# Patient Record
Sex: Female | Born: 1935 | Race: White | Hispanic: No | Smoking: Never smoker
Health system: Southern US, Community
[De-identification: ages and names within clinical notes are randomized; demographics above are authoritative.]

## PROBLEM LIST (undated history)

## (undated) DIAGNOSIS — D649 Anemia, unspecified: Secondary | ICD-10-CM

## (undated) DIAGNOSIS — L0390 Cellulitis, unspecified: Secondary | ICD-10-CM

## (undated) DIAGNOSIS — I10 Essential (primary) hypertension: Secondary | ICD-10-CM

## (undated) DIAGNOSIS — G4700 Insomnia, unspecified: Secondary | ICD-10-CM

## (undated) DIAGNOSIS — J309 Allergic rhinitis, unspecified: Secondary | ICD-10-CM

## (undated) DIAGNOSIS — R000 Tachycardia, unspecified: Secondary | ICD-10-CM

## (undated) DIAGNOSIS — I38 Endocarditis, valve unspecified: Secondary | ICD-10-CM

## (undated) DIAGNOSIS — F419 Anxiety disorder, unspecified: Secondary | ICD-10-CM

## (undated) DIAGNOSIS — K8020 Calculus of gallbladder without cholecystitis without obstruction: Secondary | ICD-10-CM

## (undated) DIAGNOSIS — M1990 Unspecified osteoarthritis, unspecified site: Secondary | ICD-10-CM

## (undated) DIAGNOSIS — Z9289 Personal history of other medical treatment: Secondary | ICD-10-CM

## (undated) HISTORY — DX: Unspecified osteoarthritis, unspecified site: M19.90

## (undated) HISTORY — DX: Calculus of gallbladder without cholecystitis without obstruction: K80.20

## (undated) HISTORY — DX: Cellulitis, unspecified: L03.90

## (undated) HISTORY — PX: THUMB ARTHROSCOPY: SHX2509

## (undated) HISTORY — PX: BUNIONECTOMY: SHX129

## (undated) HISTORY — DX: Essential (primary) hypertension: I10

## (undated) HISTORY — PX: BACK SURGERY: SHX140

## (undated) HISTORY — DX: Anxiety disorder, unspecified: F41.9

## (undated) HISTORY — PX: REPLACEMENT TOTAL KNEE: SUR1224

## (undated) HISTORY — DX: Personal history of other medical treatment: Z92.89

## (undated) HISTORY — DX: Tachycardia, unspecified: R00.0

## (undated) HISTORY — DX: Endocarditis, valve unspecified: I38

## (undated) HISTORY — DX: Allergic rhinitis, unspecified: J30.9

## (undated) HISTORY — PX: GALLBLADDER SURGERY: SHX652

## (undated) HISTORY — DX: Anemia, unspecified: D64.9

## (undated) HISTORY — DX: Insomnia, unspecified: G47.00

## (undated) HISTORY — PX: HIP SURGERY: SHX245

---

## 2004-08-27 ENCOUNTER — Ambulatory Visit: Payer: Self-pay | Admitting: Family Medicine

## 2005-05-22 ENCOUNTER — Ambulatory Visit: Payer: Self-pay | Admitting: Gastroenterology

## 2005-06-25 ENCOUNTER — Ambulatory Visit: Payer: Self-pay | Admitting: Gastroenterology

## 2005-11-10 ENCOUNTER — Ambulatory Visit: Payer: Self-pay | Admitting: Family Medicine

## 2007-03-17 ENCOUNTER — Ambulatory Visit: Payer: Self-pay | Admitting: Family Medicine

## 2007-03-25 ENCOUNTER — Ambulatory Visit: Payer: Self-pay | Admitting: Surgery

## 2007-03-29 ENCOUNTER — Ambulatory Visit: Payer: Self-pay | Admitting: Surgery

## 2007-03-29 HISTORY — PX: CHOLECYSTECTOMY: SHX55

## 2007-08-17 ENCOUNTER — Ambulatory Visit: Payer: Self-pay | Admitting: Family Medicine

## 2008-10-10 ENCOUNTER — Ambulatory Visit: Payer: Self-pay | Admitting: General Practice

## 2008-11-16 ENCOUNTER — Ambulatory Visit: Payer: Self-pay | Admitting: Family Medicine

## 2009-01-19 ENCOUNTER — Inpatient Hospital Stay (HOSPITAL_COMMUNITY): Admission: RE | Admit: 2009-01-19 | Discharge: 2009-01-23 | Payer: Self-pay | Admitting: Neurosurgery

## 2009-04-06 ENCOUNTER — Ambulatory Visit: Payer: Self-pay | Admitting: Neurosurgery

## 2009-04-18 ENCOUNTER — Ambulatory Visit: Payer: Self-pay

## 2009-05-08 ENCOUNTER — Encounter: Admission: RE | Admit: 2009-05-08 | Discharge: 2009-05-08 | Payer: Self-pay | Admitting: Neurosurgery

## 2009-06-14 ENCOUNTER — Encounter: Admission: RE | Admit: 2009-06-14 | Discharge: 2009-06-14 | Payer: Self-pay | Admitting: Neurosurgery

## 2009-09-27 ENCOUNTER — Ambulatory Visit: Payer: Self-pay | Admitting: Orthopedic Surgery

## 2010-01-21 ENCOUNTER — Ambulatory Visit: Payer: Self-pay | Admitting: Family Medicine

## 2010-05-22 ENCOUNTER — Encounter: Admission: RE | Admit: 2010-05-22 | Discharge: 2010-05-22 | Payer: Self-pay | Admitting: Neurosurgery

## 2010-08-02 ENCOUNTER — Encounter: Admission: RE | Admit: 2010-08-02 | Payer: Self-pay | Source: Home / Self Care | Admitting: Neurosurgery

## 2010-09-09 ENCOUNTER — Encounter
Admission: RE | Admit: 2010-09-09 | Discharge: 2010-09-09 | Payer: Self-pay | Source: Home / Self Care | Attending: Neurosurgery | Admitting: Neurosurgery

## 2010-10-30 ENCOUNTER — Other Ambulatory Visit: Payer: Self-pay | Admitting: Orthopedic Surgery

## 2010-10-30 DIAGNOSIS — M25551 Pain in right hip: Secondary | ICD-10-CM

## 2010-11-07 ENCOUNTER — Other Ambulatory Visit: Payer: Self-pay

## 2010-11-25 LAB — CBC
HCT: 39.8 % (ref 36.0–46.0)
MCHC: 33.5 g/dL (ref 30.0–36.0)
MCV: 90 fL (ref 78.0–100.0)
Platelets: 260 10*3/uL (ref 150–400)
WBC: 8.6 10*3/uL (ref 4.0–10.5)

## 2010-11-25 LAB — COMPREHENSIVE METABOLIC PANEL
AST: 21 U/L (ref 0–37)
Albumin: 4.3 g/dL (ref 3.5–5.2)
BUN: 12 mg/dL (ref 6–23)
CO2: 28 mEq/L (ref 19–32)
Calcium: 10.3 mg/dL (ref 8.4–10.5)
Chloride: 106 mEq/L (ref 96–112)
Creatinine, Ser: 0.59 mg/dL (ref 0.4–1.2)
GFR calc Af Amer: >60 mL/min (ref >60)
GFR calc non Af Amer: >60 mL/min (ref >60)
Total Bilirubin: 0.6 mg/dL (ref 0.3–1.2)

## 2010-11-25 LAB — TYPE AND SCREEN: ABO/RH(D): O POS

## 2010-11-25 LAB — URINALYSIS, ROUTINE W REFLEX MICROSCOPIC
Ketones, ur: NEGATIVE mg/dL
Nitrite: NEGATIVE
Protein, ur: NEGATIVE mg/dL
Urobilinogen, UA: 0.2 mg/dL (ref 0.0–1.0)

## 2010-11-25 LAB — PROTIME-INR: Prothrombin Time: 12.7 seconds (ref 11.6–15.2)

## 2010-11-25 LAB — DIFFERENTIAL
Basophils Absolute: 0 10*3/uL (ref 0.0–0.1)
Lymphocytes Relative: 20 % (ref 12–46)
Lymphs Abs: 1.7 10*3/uL (ref 0.7–4.0)
Neutro Abs: 6.2 10*3/uL (ref 1.7–7.7)

## 2010-11-25 LAB — APTT: aPTT: 32 seconds (ref 24–37)

## 2010-12-13 ENCOUNTER — Encounter: Payer: Self-pay | Admitting: Rheumatology

## 2010-12-17 ENCOUNTER — Encounter: Payer: Self-pay | Admitting: Rheumatology

## 2010-12-31 NOTE — H&P (Signed)
NAMEELERI, RUBEN NO.:  [id number]   MEDICAL RECORD NO.:  [id number]          PATIENT TYPE:  INP   LOCATION:  3016                         FACILITY:  [facility]   PHYSICIAN:  Payton Doughty, M.D.      DATE OF BIRTH:  September 04, 1932   DATE OF ADMISSION:  01/19/2009  DATE OF DISCHARGE:                              HISTORY & PHYSICAL   ADMISSION DIAGNOSIS:  Spondylosis at L4-L5.   BODY OF TEXT:  This is a very nice 77 year old right-handed white lady  since June 26, 2009 has had increasing dysesthesias in lower  extremities worse when she is up, cannot walk very far without pain in  her legs, has not fallen, does not describe bladder difficulty.  MR  shows spondylosis with a grade 1 slip at L4-L5 spinal stenosis and she  was sent to me.   MEDICAL HISTORY:  Benign.   MEDICATIONS:  She uses lisinopril, Mobic, trazodone, aspirin,  multivitamins and Tylenol.   ALLERGIES:  She is allergic to SULFA and PENICILLIN.   SURGICAL HISTORY:  Cholecystectomy, hand operation and bunion operation.   SOCIAL HISTORY:  She does not smoke or drink, is a retired Runner, broadcasting/film/video and  taught eighth grade Maths for 31 years.   FAMILY HISTORY:  Both parents are deceased, mother with stroke, father  with an MI and a stroke.   REVIEW OF SYSTEMS:  Remarkable for leg weakness, leg pain and arthritis.  HEENT:  Normal limits.  NECK:  She has reasonable range of motion.  CHEST: Clear.  CARDIAC:  Regular rate and rhythm.  ABDOMEN:  Nontender,  no hepatosplenomegaly.  EXTREMITIES:  Without clubbing, cyanosis.  GU:  Deferred.  Peripheral pulses are good.  NEUROLOGIC:  She is awake, alert  and oriented.  Cranial nerves are intact.  Motor exam shows 5/5 strength  throughout the upper and lower extremities.  Sensory dysesthesias  described roughly in L4 distribution.  Reflexes are 2 at the knees, 1 at  the ankles and toes downgoing bilaterally.   She comes with an MR that demonstrates a grade 1 slip of L4  and L5.  She  has significant spinal stenosis and facet arthropathy.   CLINICAL IMPRESSION:  Neurogenic claudication secondary to spondylosis  with a grade 1 spondylolisthesis at L4-L5.   PLAN:  Laminectomy, diskectomy, posterior lumbar interbody fusion and  pedicle screw fixation.  The risks and benefits have been discussed with  her and she wished to proceed.   .           ______________________________  Payton Doughty, M.D.     MWR/MEDQ  D:  01/19/2009  T:  01/20/2009  Job:  [id number]

## 2010-12-31 NOTE — Op Note (Signed)
NAMELASHINA, Alicia Mccann NO.:  [id number]   MEDICAL RECORD NO.:  [id number]          PATIENT TYPE:  INP   LOCATION:  3016                         FACILITY:  [facility]   PHYSICIAN:  Payton Doughty, M.D.      DATE OF BIRTH:  07/08/1933   DATE OF PROCEDURE:  01/19/2009  DATE OF DISCHARGE:                               OPERATIVE REPORT   PREOPERATIVE DIAGNOSIS:  Grade 2 spondylolisthesis of L4 and L5.   POSTOPERATIVE DIAGNOSIS:  Grade 2 spondylolisthesis of L4 and L5.   OPERATIVE PROCEDURE:  L4-5 laminectomy, diskectomy, posterior lumbar  interbody fusion using Ray threaded fusion cage, nonsegmental pedicle  screw fixation at L4-5, and posterolateral arthrodesis at L4-5.   SURGEON:  Payton Doughty, MD, Neurosurgery.   ANESTHESIA:  General endotracheal.   PREPARATION:  Prepped and draped with alcohol wipe.   COMPLICATIONS:  None.   NURSE ASSISTANT:  K[place]ik.   DOCTOR ASSISTANT:  Hilda Lias, MD   DESCRIPTION:  This is a 77 year old lady with neurogenic claudication  and spondylolisthesis of L4 and L5 that is degenerative in nature.  She  was taken to the operating room, smoothly anesthetized, intubated, and  placed prone on the operating table.  Following shave, prep, and drape  in usual sterile fashion, skin was infiltrated with 1% lidocaine with  1:400,000 epinephrine.  Skin was incised from middle of L3 to the middle  of L5 and the lamina and transverse process of L4 and top of L5, and  transverse process were dissected free in subperiosteal plane.  Intraoperative x-ray confirmed correctness of the level of the pars  interarticularis, inferior facet, the lamina of L4 and the superior end  of the superior facet of L5 were removed using the Kerrison and high-  speed drill.  The bone was set aside for grafting.  This allowed  decompression of the lateral recess.  The L5 and L4 roots were both  dissected free as they rounded their respective pedicles.  There was a  significant slip and disk bulging associated with it.  Diskectomy was  carried out at L4-5, and then Ray threaded fusion cages were placed 12 x  21 mm.  Reduction of the slip was grade 1.  Pedicle screws were then  placed in L4 and L5 under x-ray guidance and connected to the rods and  locking caps attached and tightened.  The Ray cages were packed with  bone graft harvested from the facet joints and capped.  Transverse  processes of L4 and L5 decorticated with a high-speed drill and packed  with BMP on the Actifuse  extender matrix.  Final x-ray showed good placement of cages, screws,  and rods.  Successive layers of 0 Vicryl, 2-0 Vicryl, and 3-0 nylon were  used to close.  Betadine and Telfa dressing was applied, made occlusive  with OpSite.  The patient returned to the recovery room in good  condition.           ______________________________  Payton Doughty, M.D.     MWR/MEDQ  D:  01/19/2009  T:  01/20/2009  Job:  [id number]

## 2010-12-31 NOTE — Discharge Summary (Signed)
NAMEAIYA, KEACH NO.:  [id number]   MEDICAL RECORD NO.:  [id number]          PATIENT TYPE:  INP   LOCATION:  3016                         FACILITY:  [facility]   PHYSICIAN:  Payton Doughty, M.D.      DATE OF BIRTH:  [birth year]   DATE OF ADMISSION:  01/19/2009  DATE OF DISCHARGE:  01/23/2009                               DISCHARGE SUMMARY   ADMITTING DIAGNOSIS:  Spondylosis at L4-5.   DISCHARGE DIAGNOSIS:  Spondylosis at L4-5.   OPERATIVE PROCEDURE:  L4-5 fusion.   COMPLICATION:  None.   DISCHARGE STATUS:  Alive and well.   BODY OF TEXT:  This is a 77 year old right-handed white girl whose  history and physical is recounted in the chart.  She has been followed  since November.  She has a grade 1 slip of 4/5, worsening pain in her  lower extremities.  Medical history is benign.  General exam was intact.  Neurologic exam was intact.  She was admitted after ascertaining normal  laboratory values and underwent a 4/5 fusion with Ray cages and pedicle  screws.  Postoperatively, she has done well.  She had a Foley out the  second postop day.  PCA stopped on the third postop day.  She is up and  about eating and voiding normally, participating in physical therapy,  can get her brace off and on, get in and out of bed.  She is being  discharged home in care of her family.  Her followup will be in [facility]  offices next week for suture removal.           ______________________________  Payton Doughty, M.D.     MWR/MEDQ  D:  01/23/2009  T:  01/23/2009  Job:  [id number]

## 2011-02-02 ENCOUNTER — Emergency Department: Payer: Self-pay | Admitting: Emergency Medicine

## 2011-02-04 ENCOUNTER — Inpatient Hospital Stay: Payer: Self-pay | Admitting: Specialist

## 2011-02-13 ENCOUNTER — Other Ambulatory Visit: Payer: Self-pay | Admitting: Podiatry

## 2011-02-17 ENCOUNTER — Encounter: Payer: Self-pay | Admitting: Nurse Practitioner

## 2011-10-13 ENCOUNTER — Ambulatory Visit: Payer: Self-pay | Admitting: Family Medicine

## 2011-10-23 ENCOUNTER — Encounter: Payer: Self-pay | Admitting: Family Medicine

## 2011-11-17 ENCOUNTER — Encounter: Payer: Self-pay | Admitting: Family Medicine

## 2011-12-17 ENCOUNTER — Encounter: Payer: Self-pay | Admitting: Family Medicine

## 2012-04-06 ENCOUNTER — Encounter: Payer: Self-pay | Admitting: Family Medicine

## 2012-04-18 ENCOUNTER — Encounter: Payer: Self-pay | Admitting: Family Medicine

## 2012-07-08 DIAGNOSIS — Z9889 Other specified postprocedural states: Secondary | ICD-10-CM | POA: Insufficient documentation

## 2012-07-08 DIAGNOSIS — M1610 Unilateral primary osteoarthritis, unspecified hip: Secondary | ICD-10-CM | POA: Insufficient documentation

## 2012-10-08 ENCOUNTER — Encounter: Payer: Self-pay | Admitting: Family Medicine

## 2012-10-16 ENCOUNTER — Encounter: Payer: Self-pay | Admitting: Family Medicine

## 2012-11-16 ENCOUNTER — Encounter: Payer: Self-pay | Admitting: Family Medicine

## 2013-06-27 ENCOUNTER — Encounter (INDEPENDENT_AMBULATORY_CARE_PROVIDER_SITE_OTHER): Payer: Self-pay

## 2013-06-27 ENCOUNTER — Encounter: Payer: Self-pay | Admitting: Cardiovascular Disease

## 2013-06-27 ENCOUNTER — Ambulatory Visit (INDEPENDENT_AMBULATORY_CARE_PROVIDER_SITE_OTHER): Payer: Medicare Other | Admitting: Cardiovascular Disease

## 2013-06-27 VITALS — BP 140/85 | HR 95 | Ht 62.0 in | Wt 175.5 lb

## 2013-06-27 DIAGNOSIS — I10 Essential (primary) hypertension: Secondary | ICD-10-CM | POA: Insufficient documentation

## 2013-06-27 DIAGNOSIS — R011 Cardiac murmur, unspecified: Secondary | ICD-10-CM | POA: Insufficient documentation

## 2013-06-27 DIAGNOSIS — E669 Obesity, unspecified: Secondary | ICD-10-CM

## 2013-06-27 DIAGNOSIS — M1990 Unspecified osteoarthritis, unspecified site: Secondary | ICD-10-CM | POA: Insufficient documentation

## 2013-06-27 DIAGNOSIS — E782 Mixed hyperlipidemia: Secondary | ICD-10-CM | POA: Insufficient documentation

## 2013-06-27 DIAGNOSIS — E785 Hyperlipidemia, unspecified: Secondary | ICD-10-CM

## 2013-06-27 NOTE — Progress Notes (Signed)
Patient ID: Alicia Mccann, female    DOB: September 25, 1932, 79 y.o.   MRN: [id number]  HPI Comments: Alicia Mccann is a very pleasant 79 year old woman, patient of Dr. Elease Hashimoto, history of hypertension, obesity, osteoarthritis who presents for evaluation of murmur, hypertension.  She reports that in general she has been doing well. She had a recent 6 minute walk test with Silver sneakers and initial blood pressure 142/76 increased to 2 204/96. She reports her blood pressure was taken over her coat/jacket. After 5 minutes, blood pressure improved down to 164/90. Since then her amlodipine was increased up to 5 mg daily. Prior to this change, there were several more elevated blood pressures 148 systolic and 160 systolic. Recently blood pressure 138. In our office today systolic pressures 140.  She denies any chest pain or shortness of breath with exertion. She does have a reasonable exercise tolerance, is very active, walks her dog on a daily basis with no symptoms. She is concerned about her heart murmur  Echocardiogram May 2010 showed normal LV systolic function, mild MR and TR  Echocardiogram stress test May 2010 was essentially normal. She exercised for 3 minutes achieved 4.6 METs, peak heart rate 151 beats per minute. No EKG changes concerning for ischemia. Echocardiogram showed no ischemia  Followup note from cardiology April 2011 suggested she have fatigue from low-dose beta blocker. She was on lisinopril 20 mg at that time.  She does report having a family history. Mother was a smoker, had a stroke in her early 73s. Father had MI in his late 68s was not a smoker  EKG shows normal sinus rhythm with rate 95 beats per minute, nonspecific ST abnormality     Outpatient Encounter Prescriptions as of 06/27/2013  Medication Sig  . amLODipine (NORVASC) 5 MG tablet Take 5 mg by mouth daily.   Marland Kitchen aspirin 81 MG tablet Take 81 mg by mouth daily.  . cholecalciferol (VITAMIN D) 1000 UNITS tablet Take 2,000  Units by mouth daily.  . Cyanocobalamin (VITAMIN B 12 PO) Take 1,000 mg by mouth daily.  . hydrochlorothiazide (HYDRODIURIL) 25 MG tablet Take 25 mg by mouth daily.   . meloxicam (MOBIC) 7.5 MG tablet Take 7.5 mg by mouth daily as needed for pain.  . traMADol (ULTRAM) 50 MG tablet Take 50 mg by mouth every 6 (six) hours as needed.     Review of Systems  Constitutional: Negative.   HENT: Negative.   Eyes: Negative.   Respiratory: Negative.   Cardiovascular: Negative.   Gastrointestinal: Negative.   Endocrine: Negative.   Musculoskeletal: Negative.   Skin: Negative.   Allergic/Immunologic: Negative.   Neurological: Negative.   Hematological: Negative.   Psychiatric/Behavioral: Negative.   All other systems reviewed and are negative.    BP 140/85  Pulse 95  Ht 5\' 2"  (1.575 m)  Wt 175 lb 8 oz (79.606 kg)  BMI 32.09 kg/m2  Physical Exam  Nursing note and vitals reviewed. Constitutional: She is oriented to person, place, and time. She appears well-developed and well-nourished.  HENT:  Head: Normocephalic.  Nose: Nose normal.  Mouth/Throat: Oropharynx is clear and moist.  Eyes: Conjunctivae are normal. Pupils are equal, round, and reactive to light.  Neck: Normal range of motion. Neck supple. No JVD present.  Cardiovascular: Normal rate, regular rhythm, S1 normal, S2 normal and intact distal pulses.  Exam reveals no gallop and no friction rub.   Murmur heard.  Systolic murmur is present with a grade of 2/6  Pulmonary/Chest: Effort  normal and breath sounds normal. No respiratory distress. She has no wheezes. She has no rales. She exhibits no tenderness.  Abdominal: Soft. Bowel sounds are normal. She exhibits no distension. There is no tenderness.  Musculoskeletal: Normal range of motion. She exhibits no edema and no tenderness.  Lymphadenopathy:    She has no cervical adenopathy.  Neurological: She is alert and oriented to person, place, and time. Coordination normal.  Skin:  Skin is warm and dry. No rash noted. No erythema.  Psychiatric: She has a normal mood and affect. Her behavior is normal. Judgment and thought content normal.    Assessment and Plan

## 2013-06-27 NOTE — Assessment & Plan Note (Signed)
Clinical exam suggestive of aortic valve sclerosis without stenosis. Given prior echocardiogram several years ago showing no significant aortic valve stenosis (velocity measurements were normal), would hold off on further testing. Murmur is a grade 1-2. Would suggest repeat exam once a year. Typically aortic valve stenosis will take many years for significant progression, likely more than 10 years. Minimal mitral and tricuspid valve regurgitation on prior echocardiogram. Typically this will not progress. We have discussed this with the patient.

## 2013-06-27 NOTE — Assessment & Plan Note (Signed)
We'll try to obtain the most recent lipid panel for our records. Would recommend aggressive treatment for cholesterol given father who had MI in his late 78s, mother with stroke at 67

## 2013-06-27 NOTE — Patient Instructions (Addendum)
You are doing well. No medication changes were made.  Please monitor your blood pressure Goal is <140/<90 If it runs high, take an extra amlodipine (5 twice a day or 10 once a day)  Consider Red Yeast Rice for cholesterol  Please call us if you have new issues that need to be addressed before your next appt.  Your physician wants you to follow-up in: 12 months.  You will receive a reminder letter in the mail two months in advance. If you don't receive a letter, please call our office to schedule the follow-up appointment.

## 2013-06-27 NOTE — Assessment & Plan Note (Signed)
Blood pressure did improve on recheck by myself with systolic pressure 140. I suspect she may need amlodipine 10 mg at baseline. Blood pressure may improve with improved conditioning. Encouraged her to continue her walking program. Also encouraged her to buy a blood pressure cuff, check her blood pressures at home and bring these in when she sees Dr. Elease Hashimoto in followup. We'll avoid beta blockers given fatigue in the past. Seems to be doing well on amlodipine and HCTZ.

## 2013-06-27 NOTE — Assessment & Plan Note (Signed)
We have encouraged continued exercise, careful diet management in an effort to lose weight. 

## 2013-06-27 NOTE — Assessment & Plan Note (Signed)
Suggested she continue on her meloxicam. If she starts to take as frequently, would let Dr. Elease Hashimoto now. May benefit from a PPI.

## 2014-02-08 LAB — BASIC METABOLIC PANEL
BUN: 24 mg/dL — AB (ref 4–21)
CREATININE: 1 mg/dL (ref 0.5–1.1)
GLUCOSE: 104 mg/dL
POTASSIUM: 3.5 mmol/L (ref 3.4–5.3)
Sodium: 137 mmol/L (ref 137–147)

## 2014-02-08 LAB — TSH: TSH: 1.74 u[IU]/mL (ref 0.41–5.90)

## 2014-02-08 LAB — CBC AND DIFFERENTIAL
HCT: 39 % (ref 36–46)
Hemoglobin: 13.3 g/dL (ref 12.0–16.0)
PLATELETS: 359 10*3/uL (ref 150–399)

## 2014-02-08 LAB — HEPATIC FUNCTION PANEL
ALT: 28 U/L (ref 7–35)
AST: 17 U/L (ref 13–35)

## 2014-06-19 ENCOUNTER — Encounter: Payer: Self-pay | Admitting: Family Medicine

## 2014-06-27 ENCOUNTER — Ambulatory Visit (INDEPENDENT_AMBULATORY_CARE_PROVIDER_SITE_OTHER): Payer: Medicare Other | Admitting: Cardiovascular Disease

## 2014-06-27 ENCOUNTER — Encounter: Payer: Self-pay | Admitting: Cardiovascular Disease

## 2014-06-27 VITALS — BP 160/80 | HR 75 | Ht 61.0 in | Wt 168.2 lb

## 2014-06-27 DIAGNOSIS — E669 Obesity, unspecified: Secondary | ICD-10-CM

## 2014-06-27 DIAGNOSIS — E785 Hyperlipidemia, unspecified: Secondary | ICD-10-CM

## 2014-06-27 DIAGNOSIS — R011 Cardiac murmur, unspecified: Secondary | ICD-10-CM

## 2014-06-27 DIAGNOSIS — I10 Essential (primary) hypertension: Secondary | ICD-10-CM

## 2014-06-27 DIAGNOSIS — Z0000 Encounter for general adult medical examination without abnormal findings: Secondary | ICD-10-CM | POA: Insufficient documentation

## 2014-06-27 NOTE — Progress Notes (Signed)
Patient ID: Lula OlszewskiJacqueline L Kronick, female    DOB: 01/08/1933, 80 y.o.   MRN: [id number]  HPI Comments: [name] is a very pleasant 80 year old woman, patient of Dr. Elease HashimotoMaloney, history of hypertension, obesity, osteoarthritis, previously evaluated for murmur , hypertension. She presents for routine follow-up  In follow-up today, she reports that she lost her son earlier in the year likely secondary to acute MI.  She has had difficulty adjusting in light of this sad news. She is exercising on a regular basis, daily. She denies having any shortness of breath or chest discomfort. She also walks her dog and has no symptoms.  She reports that in general she has been doing well. She had a recent 6 minute walk test with Silver sneakers and initial blood pressure 142/76 increased to 2 204/96. She reports her blood pressure was taken over her coat/jacket. After 5 minutes, blood pressure improved down to 164/90. Since then her amlodipine was increased up to 5 mg daily. Prior to this change, there were several more elevated blood pressures 148 systolic and 160 systolic. Recently blood pressure 138. In our office today systolic pressures 140.  She denies any chest pain or shortness of breath with exertion. She does have a reasonable exercise tolerance, is very active, walks her dog on a daily basis with no symptoms.  EKG shows normal sinus rhythm with rate 75 beats per minute, no significant ST or T-wave abnormality  Other past medical history Echocardiogram May 2010 showed normal LV systolic function, mild MR and TR  Echocardiogram stress test May 2010 was essentially normal. She exercised for 3 minutes achieved 4.6 METs, peak heart rate 151 beats per minute. No EKG changes concerning for ischemia. Echocardiogram showed no ischemia  Followup note from cardiology April 2011 suggested she have fatigue from low-dose beta blocker. She was on lisinopril 20 mg at that time.  She does report having a family history.  Mother was a smoker, had a stroke in her early 7660s. Father had MI in his late 6250s was not a smoker     Outpatient Encounter Prescriptions as of 06/27/2014  Medication Sig  . Acetaminophen (EXTRA STRENGTH ACETAMINOPHEN) 500 MG coapsule Take 500 mg by mouth as needed for fever.  Marland Kitchen. amLODipine (NORVASC) 5 MG tablet Take 2.5 mg by mouth daily.   . cholecalciferol (VITAMIN D) 1000 UNITS tablet Take 2,000 Units by mouth daily.  . Cyanocobalamin (VITAMIN B 12 PO) Take 1,000 mg by mouth daily.  . meloxicam (MOBIC) 7.5 MG tablet Take 7.5 mg by mouth daily as needed for pain.  . traMADol (ULTRAM) 50 MG tablet Take 50 mg by mouth every 6 (six) hours as needed.  . triamterene-hydrochlorothiazide (DYAZIDE) 50-25 MG per capsule Take 1 capsule by mouth every morning.   Social history  reports that she has never smoked. She does not have any smokeless tobacco history on file. She reports that she does not drink alcohol or use illicit drugs.  Review of Systems  Constitutional: Negative.   Eyes: Negative.   Respiratory: Negative.   Cardiovascular: Negative.   Musculoskeletal: Negative.   Neurological: Negative.   Psychiatric/Behavioral: Negative.   All other systems reviewed and are negative.   BP 160/80 mmHg  Ht 5\' 1"  (1.549 m)  Wt 168 lb 4 oz (76.318 kg)  BMI 31.81 kg/m2  Physical Exam  Constitutional: She is oriented to person, place, and time. She appears well-developed and well-nourished.  obese  HENT:  Head: Normocephalic.  Nose: Nose normal.  Mouth/Throat: Oropharynx is clear and moist.  Eyes: Conjunctivae are normal. Pupils are equal, round, and reactive to light.  Neck: Normal range of motion. Neck supple. No JVD present.  Cardiovascular: Normal rate, regular rhythm, S1 normal, S2 normal and intact distal pulses.  Exam reveals no gallop and no friction rub.   Murmur heard.  Systolic murmur is present with a grade of 1/6  Pulmonary/Chest: Effort normal and breath sounds normal. No  respiratory distress. She has no wheezes. She has no rales. She exhibits no tenderness.  Abdominal: Soft. Bowel sounds are normal. She exhibits no distension. There is no tenderness.  Musculoskeletal: Normal range of motion. She exhibits no edema or tenderness.  Lymphadenopathy:    She has no cervical adenopathy.  Neurological: She is alert and oriented to person, place, and time. Coordination normal.  Skin: Skin is warm and dry. No rash noted. No erythema.  Psychiatric: She has a normal mood and affect. Her behavior is normal. Judgment and thought content normal.    Assessment and Plan  Nursing note and vitals reviewed.

## 2014-06-27 NOTE — Assessment & Plan Note (Signed)
We have encouraged continued exercise, careful diet management in an effort to lose weight. 

## 2014-06-27 NOTE — Assessment & Plan Note (Signed)
No prior lipid panel available for the past several years. We did check with [facility]. She will come in at her convenience for a lipid panel. Significant family history of cardiac disease

## 2014-06-27 NOTE — Assessment & Plan Note (Signed)
Minimal murmur appreciated on exam, likely aortic valve sclerosis without stenosis

## 2014-06-27 NOTE — Patient Instructions (Signed)
You are doing well. No medication changes were made.  Please call us if you have new issues that need to be addressed before your next appt.  Your physician wants you to follow-up in: 12 months.  You will receive a reminder letter in the mail two months in advance. If you don't receive a letter, please call our office to schedule the follow-up appointment.  Your next appointment will be scheduled in our new office located at :  [facility]  [address], [address]  [place], [place] [address]  

## 2014-06-27 NOTE — Assessment & Plan Note (Signed)
Blood pressure is well controlled on today's visit. No changes made to the medications. 

## 2014-07-18 ENCOUNTER — Encounter: Payer: Self-pay | Admitting: Family Medicine

## 2014-08-18 ENCOUNTER — Encounter: Payer: Self-pay | Admitting: Family Medicine

## 2014-10-13 ENCOUNTER — Encounter: Payer: Self-pay | Admitting: Family Medicine

## 2014-10-17 ENCOUNTER — Encounter
Admit: 2014-10-17 | Disposition: A | Discharge status: Home / Self Care | Payer: Self-pay | Attending: Family Medicine | Admitting: Family Medicine

## 2014-11-17 ENCOUNTER — Encounter
Admit: 2014-11-17 | Disposition: A | Discharge status: Home / Self Care | Payer: Self-pay | Attending: Family Medicine | Admitting: Family Medicine

## 2015-03-15 ENCOUNTER — Other Ambulatory Visit: Payer: Self-pay | Admitting: Family Medicine

## 2015-03-15 DIAGNOSIS — I10 Essential (primary) hypertension: Secondary | ICD-10-CM

## 2015-04-30 ENCOUNTER — Telehealth: Payer: Self-pay | Admitting: Family Medicine

## 2015-04-30 ENCOUNTER — Ambulatory Visit (INDEPENDENT_AMBULATORY_CARE_PROVIDER_SITE_OTHER): Payer: Medicare Other | Admitting: Family Medicine

## 2015-04-30 ENCOUNTER — Encounter: Payer: Self-pay | Admitting: Family Medicine

## 2015-04-30 VITALS — BP 140/78 | HR 80 | Temp 97.9°F | Resp 16 | Ht 61.5 in | Wt 169.0 lb

## 2015-04-30 DIAGNOSIS — K5730 Diverticulosis of large intestine without perforation or abscess without bleeding: Secondary | ICD-10-CM | POA: Diagnosis not present

## 2015-04-30 DIAGNOSIS — J309 Allergic rhinitis, unspecified: Secondary | ICD-10-CM | POA: Insufficient documentation

## 2015-04-30 DIAGNOSIS — M545 Low back pain, unspecified: Secondary | ICD-10-CM | POA: Insufficient documentation

## 2015-04-30 DIAGNOSIS — G8929 Other chronic pain: Secondary | ICD-10-CM | POA: Insufficient documentation

## 2015-04-30 DIAGNOSIS — G4700 Insomnia, unspecified: Secondary | ICD-10-CM | POA: Insufficient documentation

## 2015-04-30 DIAGNOSIS — M1990 Unspecified osteoarthritis, unspecified site: Secondary | ICD-10-CM | POA: Insufficient documentation

## 2015-04-30 DIAGNOSIS — F338 Other recurrent depressive disorders: Secondary | ICD-10-CM | POA: Insufficient documentation

## 2015-04-30 DIAGNOSIS — K5732 Diverticulitis of large intestine without perforation or abscess without bleeding: Secondary | ICD-10-CM

## 2015-04-30 DIAGNOSIS — I38 Endocarditis, valve unspecified: Secondary | ICD-10-CM | POA: Insufficient documentation

## 2015-04-30 DIAGNOSIS — D649 Anemia, unspecified: Secondary | ICD-10-CM | POA: Insufficient documentation

## 2015-04-30 DIAGNOSIS — G5600 Carpal tunnel syndrome, unspecified upper limb: Secondary | ICD-10-CM | POA: Insufficient documentation

## 2015-04-30 DIAGNOSIS — F419 Anxiety disorder, unspecified: Secondary | ICD-10-CM | POA: Insufficient documentation

## 2015-04-30 DIAGNOSIS — Z6830 Body mass index (BMI) 30.0-30.9, adult: Secondary | ICD-10-CM | POA: Insufficient documentation

## 2015-04-30 DIAGNOSIS — R600 Localized edema: Secondary | ICD-10-CM | POA: Insufficient documentation

## 2015-04-30 MED ORDER — METRONIDAZOLE 500 MG PO TABS: 500.0000 mg | ORAL_TABLET | Freq: Three times a day (TID) | ORAL | Status: DC

## 2015-04-30 MED ORDER — CIPROFLOXACIN HCL 250 MG PO TABS: 250.0000 mg | ORAL_TABLET | Freq: Two times a day (BID) | ORAL | Status: DC

## 2015-04-30 NOTE — Telephone Encounter (Signed)
Pt was just in and she was prescribed a medication and when she took it to the pharmacy to be filled they told her that it was to close to a prescription that she is allergic to.  They want to know if you can prescribe her something different.  She uses [facility] .  Her call back is [phone]  Thanks Barth Kirks

## 2015-04-30 NOTE — Progress Notes (Signed)
Subjective:    Patient ID: Alicia Mccann, female    DOB: 18-May-1933, 81 y.o.   MRN: [id number]  Abdominal Pain This is a recurrent problem. The problem has been waxing and waning. The pain is located in the generalized abdominal region. The pain is at a severity of 6/10. The pain is moderate. The quality of the pain is cramping and sharp. The abdominal pain does not radiate. Associated symptoms include arthralgias (arthritis), constipation, diarrhea, a fever ("Not quite 101") and frequency (pt's baseline). Pertinent negatives include no anorexia, belching, dysuria, flatus, headaches, hematochezia, hematuria, melena, myalgias, nausea, vomiting or weight loss. Nothing aggravates the pain. The pain is relieved by bowel movements. Her past medical history is significant for irritable bowel syndrome.   Had grilled chicken salad and has been bad since then. Has not had it as bad. Did take antibiotic for something similar in the past.  No energy.    Review of Systems  Constitutional: Positive for fever ("Not quite 101"), activity change and fatigue. Negative for chills, weight loss, diaphoresis, appetite change and unexpected weight change.  Gastrointestinal: Positive for abdominal pain, diarrhea and constipation. Negative for nausea, vomiting, melena, hematochezia, anorexia and flatus.  Genitourinary: Positive for frequency (pt's baseline). Negative for dysuria and hematuria.  Musculoskeletal: Positive for arthralgias (arthritis). Negative for myalgias.  Neurological: Negative for headaches.   BP 140/78 mmHg  Pulse 80  Temp(Src) 97.9 F (36.6 C) (Oral)  Resp 16  Ht 5' 1.5" (1.562 m)  Wt 169 lb (76.658 kg)  BMI 31.42 kg/m2   Patient Active Problem List   Diagnosis Date Noted  . Allergic rhinitis 04/30/2015  . Absolute anemia 04/30/2015  . Anxiety 04/30/2015  . Adult BMI 30+ 04/30/2015  . Carpal tunnel syndrome 04/30/2015  . Chronic LBP 04/30/2015  . Edema extremities 04/30/2015  .  Cannot sleep 04/30/2015  . Arthritis sicca 04/30/2015  . Seasonal affective disorder 04/30/2015  . Heart valve disease 04/30/2015  . Encounter for preventive health examination 06/27/2014  . Heart murmur 06/27/2013  . Essential hypertension 06/27/2013  . Obesity 06/27/2013  . Osteoarthritis 06/27/2013  . Hyperlipidemia 06/27/2013  . Coxitis 07/08/2012  . History of repair of hip joint 07/08/2012  . Cholelithiasis without obstruction 03/22/2007   Past Medical History  Diagnosis Date  . Osteoarthritis   . Cellulitis   . Anxiety   . Tachycardia   . Tachycardia   . Allergic rhinitis   . Insomnia   . Hypertension   . Anemia   . Valvular heart disease   . Cholelithiasis    Current Outpatient Prescriptions on File Prior to Visit  Medication Sig  . Acetaminophen (EXTRA STRENGTH ACETAMINOPHEN) 500 MG coapsule Take 500 mg by mouth as needed for fever.  Marland Kitchen amLODipine (NORVASC) 5 MG tablet Take 2.5 mg by mouth daily.   Marland Kitchen aspirin 81 MG tablet Take 81 mg by mouth daily.  . cholecalciferol (VITAMIN D) 1000 UNITS tablet Take 2,000 Units by mouth daily.  Marland Kitchen triamterene-hydrochlorothiazide (MAXZIDE) 75-50 MG per tablet TAKE ONE TABLET BY MOUTH EVERY DAY  . Cyanocobalamin (VITAMIN B 12 PO) Take 1,000 mg by mouth daily.  . meloxicam (MOBIC) 7.5 MG tablet Take 7.5 mg by mouth daily as needed for pain.  . traMADol (ULTRAM) 50 MG tablet Take 50 mg by mouth every 6 (six) hours as needed.   No current facility-administered medications on file prior to visit.   Allergies  Allergen Reactions  . Albumen, Egg   . Eggs Or  Egg-Derived Products   . Milk-Related Compounds   . Penicillins   . Shellfish Allergy   . Sulfa Antibiotics   . Wheat Bran    Past Surgical History  Procedure Laterality Date  . Hip surgery      bilateral   . Back surgery    . Gallbladder surgery    . Bunionectomy    . Cholecystectomy  03/29/2007  . Thumb arthroscopy     Social History   Social History  . Marital  Status: Widowed    Spouse Name: N/A  . Number of Children: N/A  . Years of Education: N/A   Occupational History  . Not on file.   Social History Main Topics  . Smoking status: Never Smoker   . Smokeless tobacco: Never Used  . Alcohol Use: No  . Drug Use: No  . Sexual Activity: Not on file   Other Topics Concern  . Not on file   Social History Narrative   Family History  Problem Relation Age of Onset  . Heart attack Father   . Stroke Father   . Heart disease Father   . CAD Father   . Heart attack Son 27    MI  . Hypertension Son   . Hypertension Mother   . Heart disease Mother   . Stroke Mother        Objective:   Physical Exam  Constitutional: She is oriented to person, place, and time. She appears well-developed and well-nourished.  Cardiovascular: Normal rate and regular rhythm.   Pulmonary/Chest: Effort normal and breath sounds normal.  Abdominal: Soft. Bowel sounds are normal. There is tenderness (mild in left lower quadrant. ).  Neurological: She is alert and oriented to person, place, and time.  Psychiatric: She has a normal mood and affect. Her behavior is normal.  BP 140/78 mmHg  Pulse 80  Temp(Src) 97.9 F (36.6 C) (Oral)  Resp 16  Ht 5' 1.5" (1.562 m)  Wt 169 lb (76.658 kg)  BMI 31.42 kg/m2       Assessment & Plan:  1. Diverticulosis of large intestine without hemorrhage Reviewed previous colonoscopy.   2. Diverticulitis of large intestine without perforation or abscess without bleeding New problem.  Is improved some today, but still tender. In light of age and fever and known diverticulosis. Will treat. Patient instructed to call back if condition worsens or does not improve.    - metroNIDAZOLE (FLAGYL) 500 MG tablet; Take 1 tablet (500 mg total) by mouth 3 (three) times daily.  Dispense: 21 tablet; Refill: 0 - ciprofloxacin (CIPRO) 250 MG tablet; Take 1 tablet (250 mg total) by mouth 2 (two) times daily.  Dispense: 14 tablet; Refill: 0    Patient was seen and examined by Leo Grosser, MD, and note scribed, in part, by Allene Dillon, CMA and reviewed with patient. I have reviewed the document for accuracy and completeness and I agree with above. Leo Grosser, MD .   Lorie Phenix, MD

## 2015-04-30 NOTE — Telephone Encounter (Signed)
Tried calling patient. Need to know which abx they are referring to. May need to call pharmacy. Will try again later.

## 2015-04-30 NOTE — Telephone Encounter (Signed)
Pt was just in, you gave her a medication that when she took it to her pharmacy they told her it was too close to a prescription that she is allergic to and she wants to know if you can prescribe her something else.   She goes to [facility] .  Her call back is [phone]  Thanks Barth Kirks

## 2015-04-30 NOTE — Telephone Encounter (Signed)
Please call pharmacy and clarify which medication. Also contact patient and see how she is doing. Thanks.

## 2015-05-01 NOTE — Telephone Encounter (Signed)
Pt says she is allergic to Levofloxacin which is a relative of Cipro.  (I just added it to her allergy list).  Please send another antibiotic to [facility].   She was able to get the Flagyl and has already taking two doses.  She says she is starting to feel a little better today.   Thanks,   -Vernona Rieger

## 2015-05-01 NOTE — Telephone Encounter (Signed)
Patient has been on Cipro multiple times in the past. Have pharmacist check their records and clarify Levaquin allergy.  If not true allergy, need to proceed. Patient has diverticulitis and PCN and Sulfa allergy also, unless they have another suggestion. Thanks.

## 2015-05-01 NOTE — Telephone Encounter (Signed)
Called [facility] and the pharmacist has already heard from our office and he Verified that Alicia Mccann is going to pick up the prescription today.   Thanks,   -Vernona Rieger

## 2015-05-07 ENCOUNTER — Telehealth: Payer: Self-pay | Admitting: Family Medicine

## 2015-05-07 NOTE — Telephone Encounter (Signed)
Pt states she had to stop the Rx for ciprofloxacin (CIPRO) 250 MG due to having indigestion and she had to stop the metroNIDAZOLE (FLAGYL) 500 MG due to it made her feel like she had fuzz in her head/she could not think clearly.  Pt wanted to let Dr Elease Hashimoto know this.  Pt states she is much better, no fever and not pain.  [id number]#[phone]/[id number]

## 2015-05-07 NOTE — Telephone Encounter (Signed)
Pt advised.   Thanks,   -Fleurette Woolbright  

## 2015-05-07 NOTE — Telephone Encounter (Signed)
Restart if symptoms recur. Thanks.

## 2015-05-07 NOTE — Telephone Encounter (Signed)
Dr. Elease Hashimoto this was sent to Dr Sullivan Lone but the message was for you.  Thanks [employer]

## 2015-05-19 ENCOUNTER — Ambulatory Visit (INDEPENDENT_AMBULATORY_CARE_PROVIDER_SITE_OTHER): Payer: Medicare Other | Admitting: Family Medicine

## 2015-05-19 VITALS — BP 140/76 | HR 76 | Temp 97.5°F | Resp 16 | Wt 171.0 lb

## 2015-05-19 DIAGNOSIS — R1084 Generalized abdominal pain: Secondary | ICD-10-CM

## 2015-05-19 MED ORDER — HYOSCYAMINE SULFATE 0.125 MG SL SUBL: 0.1250 mg | SUBLINGUAL_TABLET | Freq: Four times a day (QID) | SUBLINGUAL | Status: DC | PRN

## 2015-05-19 NOTE — Progress Notes (Signed)
Patient ID: Alicia Mccann, female   DOB: 1932/10/12, 81 y.o.   MRN: [id number]        [name] Female    DOB: 18-Nov-1932   81 y.o.   MRN: [id number] Visit Date: 05/19/2015  Today's Provider: Mila Merry, MD   Chief Complaint  Patient presents with  . Abdominal Pain   Subjective:    Abdominal Pain This is a recurrent problem. The current episode started today. The onset quality is sudden. The pain is located in the generalized abdominal region. The quality of the pain is cramping. Associated symptoms include diarrhea (loose stools). Pertinent negatives include no constipation, dysuria, fever, frequency, headaches, hematuria, myalgias, nausea, vomiting or weight loss. She has tried antibiotics for the symptoms. Diverticulitis    She was seen 04/30/2015 and treated for diverticulitis. She was prescribed metronidazole and Cipro. She stopped the cipro after a few days due indigestion, and stopped metronidazole a few days later.due to feeling 'fuzzy in her head' Started having discomfort this morning after eating breakfast this morning (cheese toast, which she has every morning) The discomfort as not localized. Symptoms resolved after having BM a few minutes ago. BM was loose. No blood      Allergies  Allergen Reactions  . Albumen, Egg   . Eggs Or Egg-Derived Products   . Levaquin [Levofloxacin]   . Milk-Related Compounds   . Penicillins   . Shellfish Allergy   . Sulfa Antibiotics   . Wheat Bran    Previous Medications   ACETAMINOPHEN (EXTRA STRENGTH ACETAMINOPHEN) 500 MG COAPSULE    Take 500 mg by mouth as needed for fever.   ALPRAZOLAM (XANAX) 0.25 MG TABLET    Take by mouth.   AMLODIPINE (NORVASC) 5 MG TABLET    Take 2.5 mg by mouth daily.    ASPIRIN 81 MG TABLET    Take 81 mg by mouth daily.   CHOLECALCIFEROL (VITAMIN D) 1000 UNITS TABLET    Take 2,000 Units by mouth daily.   CYANOCOBALAMIN (VITAMIN B 12 PO)    Take 1,000 mg by mouth daily.   MELOXICAM  (MOBIC) 7.5 MG TABLET    Take 7.5 mg by mouth daily as needed for pain.   TRAMADOL (ULTRAM) 50 MG TABLET    Take 50 mg by mouth every 6 (six) hours as needed.   TRIAMTERENE-HYDROCHLOROTHIAZIDE (MAXZIDE) 75-50 MG PER TABLET    TAKE ONE TABLET BY MOUTH EVERY DAY    Review of Systems  Constitutional: Positive for diaphoresis. Negative for fever, chills, weight loss, activity change, appetite change, fatigue and unexpected weight change.  Gastrointestinal: Positive for abdominal pain, diarrhea (loose stools) and abdominal distention. Negative for nausea, vomiting, constipation, blood in stool, anal bleeding and rectal pain.  Endocrine: Negative for cold intolerance, heat intolerance, polydipsia, polyphagia and polyuria.  Genitourinary: Negative for dysuria, urgency, frequency, hematuria, flank pain, decreased urine volume, vaginal bleeding, vaginal discharge, enuresis, difficulty urinating, vaginal pain and dyspareunia.  Musculoskeletal: Negative for myalgias.  Neurological: Negative for dizziness, light-headedness and headaches.    Social History  Substance Use Topics  . Smoking status: Never Smoker   . Smokeless tobacco: Never Used  . Alcohol Use: No   Objective:   BP 140/76 mmHg  Pulse 76  Temp(Src) 97.5 F (36.4 C) (Oral)  Resp 16  Wt 171 lb (77.565 kg)  Physical Exam  General Appearance:    Alert, cooperative, no distress  Eyes:    PERRL, conjunctiva/corneas clear, EOM's intact  Lungs:     Clear to auscultation bilaterally, respirations unlabored  Heart:    Regular rate and rhythm  Abdomen:   bowel sounds present and normal in all 4 quadrants, soft, nontender, nondistended or no rebound, no guarding. No CVA tenderness        Assessment & Plan:     1. Generalized abdominal pain Resolved since having BM while waiting to be seen this morning. May have had mild GI virus. No sign of bacterial infection now. Did not tolerate Cipro & Flagyl prescribed at last visit. Stick to  bland diet now, given rx for hyoscyamine 0.125 SL to take prn if sx return. Discussed that she is due for routine colonoscopy which may be beneficial for further evaluation of recent GI symptoms. She states she has wellness visit with Dr. Elease Hashimoto in a few weeks and will discuss further with her.        Mila Merry, MD  [facility]

## 2015-05-23 ENCOUNTER — Telehealth: Payer: Self-pay | Admitting: Family Medicine

## 2015-05-23 NOTE — Telephone Encounter (Signed)
Pt sates she is still having stomach pain that comes and goes.  Pt stools are loose most of the time and she has no energy. Pt states she seen Dr Sherrie Mustache on Saturday due to diarrhea, broke out into a sweat and stomach pain.   Pt states none of the medications she has taken has helped at all except for the Rx she rec'd Saturday has helped some but not completely made the pain go away.  Pt is requesting advise on what to do to help with this.  [phone]/[id number]

## 2015-05-24 ENCOUNTER — Other Ambulatory Visit: Payer: Self-pay | Admitting: Family Medicine

## 2015-05-24 ENCOUNTER — Ambulatory Visit (INDEPENDENT_AMBULATORY_CARE_PROVIDER_SITE_OTHER): Payer: Medicare Other | Admitting: Family Medicine

## 2015-05-24 ENCOUNTER — Ambulatory Visit
Admission: RE | Admit: 2015-05-24 | Discharge: 2015-05-24 | Disposition: A | Discharge status: Home / Self Care | Payer: Medicare Other | Source: Ambulatory Visit | Attending: Family Medicine | Admitting: Family Medicine

## 2015-05-24 ENCOUNTER — Encounter: Payer: Self-pay | Admitting: Family Medicine

## 2015-05-24 ENCOUNTER — Telehealth: Payer: Self-pay | Admitting: Family Medicine

## 2015-05-24 VITALS — BP 138/64 | HR 92 | Temp 98.3°F | Resp 16 | Wt 167.0 lb

## 2015-05-24 DIAGNOSIS — K5732 Diverticulitis of large intestine without perforation or abscess without bleeding: Secondary | ICD-10-CM

## 2015-05-24 DIAGNOSIS — R109 Unspecified abdominal pain: Secondary | ICD-10-CM | POA: Insufficient documentation

## 2015-05-24 DIAGNOSIS — K5792 Diverticulitis of intestine, part unspecified, without perforation or abscess without bleeding: Secondary | ICD-10-CM | POA: Insufficient documentation

## 2015-05-24 DIAGNOSIS — R1032 Left lower quadrant pain: Secondary | ICD-10-CM

## 2015-05-24 LAB — POCT I-STAT CREATININE: CREATININE: 1.6 mg/dL — AB (ref 0.44–1.00)

## 2015-05-24 NOTE — Telephone Encounter (Signed)
Really need to see her to assess. Sorry. Thanks.

## 2015-05-24 NOTE — Progress Notes (Signed)
Subjective:    Patient ID: Alicia Mccann, female    DOB: [birth year], 81 y.o.   MRN: [id number]  HPI  Abdominal Pain This is a recurrent problem. The current episode started Saturday (x 5 days). The onset quality is sudden. The pain is located in left lower quadrant. The quality of the pain is an aching pain. Associated symptoms include diarrhea (loose stools) and weight loss (pt reports she is afraid to eat). Pertinent negatives include no constipation, dysuria, fever, frequency, headaches, hematuria, myalgias, nausea, or vomiting. She has tried antibiotics for the symptoms and could not tolerate them.     She was seen 04/30/2015 and treated for diverticulitis. She was prescribed metronidazole and Cipro. She stopped the cipro after a few days due indigestion, and stopped metronidazole a few days later.due to feeling 'fuzzy in her head' Started having discomfort Saturday morning after eating breakfast (cheese toast, which she has every morning). Symptoms resolved after having BM. BM was loose. No blood   Pt was seen on 05/19/2015 and saw Dr. Sherrie Mustache. Pt was started on hyoscyamine 0.125 SL PRN due to intolerance of abx. Still with symptoms.   Review of Systems  Constitutional: Positive for appetite change and unexpected weight change. Negative for fever, chills, diaphoresis, activity change and fatigue.  Gastrointestinal: Positive for abdominal pain and diarrhea. Negative for nausea, vomiting, constipation and blood in stool.   BP 138/64 mmHg  Pulse 92  Temp(Src) 98.3 F (36.8 C) (Oral)  Resp 16  Wt 167 lb (75.751 kg)   Patient Active Problem List   Diagnosis Date Noted  . Allergic rhinitis 04/30/2015  . Absolute anemia 04/30/2015  . Anxiety 04/30/2015  . Adult BMI 30+ 04/30/2015  . Carpal tunnel syndrome 04/30/2015  . Chronic LBP 04/30/2015  . Edema extremities 04/30/2015  . Cannot sleep 04/30/2015  . Arthritis sicca 04/30/2015  . Seasonal affective disorder (HCC) 04/30/2015   . Heart valve disease 04/30/2015  . Encounter for preventive health examination 06/27/2014  . Heart murmur 06/27/2013  . Essential hypertension 06/27/2013  . Obesity 06/27/2013  . Osteoarthritis 06/27/2013  . Hyperlipidemia 06/27/2013  . Coxitis 07/08/2012  . History of repair of hip joint 07/08/2012  . Cholelithiasis without obstruction 03/22/2007   Past Medical History  Diagnosis Date  . Osteoarthritis   . Cellulitis   . Anxiety   . Tachycardia   . Tachycardia   . Allergic rhinitis   . Insomnia   . Hypertension   . Anemia   . Valvular heart disease   . Cholelithiasis    Current Outpatient Prescriptions on File Prior to Visit  Medication Sig  . Acetaminophen (EXTRA STRENGTH ACETAMINOPHEN) 500 MG coapsule Take 500 mg by mouth as needed for fever.  . ALPRAZolam (XANAX) 0.25 MG tablet Take by mouth.  Marland Kitchen amLODipine (NORVASC) 5 MG tablet Take 2.5 mg by mouth daily.   Marland Kitchen aspirin 81 MG tablet Take 81 mg by mouth daily.  . cholecalciferol (VITAMIN D) 1000 UNITS tablet Take 2,000 Units by mouth daily.  . Cyanocobalamin (VITAMIN B 12 PO) Take 1,000 mg by mouth daily.  . hyoscyamine (LEVSIN SL) 0.125 MG SL tablet Place 1 tablet (0.125 mg total) under the tongue every 6 (six) hours as needed (abdominal discomfort).  . meloxicam (MOBIC) 7.5 MG tablet Take 7.5 mg by mouth daily as needed for pain.  . traMADol (ULTRAM) 50 MG tablet Take 50 mg by mouth every 6 (six) hours as needed.  . triamterene-hydrochlorothiazide (MAXZIDE) 75-50 MG per tablet  TAKE ONE TABLET BY MOUTH EVERY DAY   No current facility-administered medications on file prior to visit.   Allergies  Allergen Reactions  . Albumen, Egg   . Eggs Or Egg-Derived Products   . Levaquin [Levofloxacin]   . Milk-Related Compounds   . Penicillins   . Shellfish Allergy   . Sulfa Antibiotics   . Wheat Bran    Past Surgical History  Procedure Laterality Date  . Hip surgery      bilateral   . Back surgery    . Gallbladder  surgery    . Bunionectomy    . Cholecystectomy  03/29/2007  . Thumb arthroscopy     Social History   Social History  . Marital Status: Widowed    Spouse Name: N/A  . Number of Children: N/A  . Years of Education: N/A   Occupational History  . Not on file.   Social History Main Topics  . Smoking status: Never Smoker   . Smokeless tobacco: Never Used  . Alcohol Use: No  . Drug Use: No  . Sexual Activity: Not on file   Other Topics Concern  . Not on file   Social History Narrative   Family History  Problem Relation Age of Onset  . Heart attack Father   . Stroke Father   . Heart disease Father   . CAD Father   . Heart attack Son 24    MI  . Hypertension Son   . Hypertension Mother   . Heart disease Mother   . Stroke Mother       Objective:   Physical Exam  Constitutional: She is oriented to person, place, and time. She appears well-developed and well-nourished.  Cardiovascular: Normal rate and regular rhythm.   Pulmonary/Chest: Effort normal and breath sounds normal.  Abdominal: Soft. There is tenderness in the left lower quadrant.  Neurological: She is alert and oriented to person, place, and time.   BP 138/64 mmHg  Pulse 92  Temp(Src) 98.3 F (36.8 C) (Oral)  Resp 16  Wt 167 lb (75.751 kg)      Assessment & Plan:  1. Left lower quadrant pain Suspicious for mild partially treated diverticulitis.  Complicated by patient allergic reaction to medication. Will check CT scan so can try to figure out etiology and further plan pending these results.    Lorie Phenix, MD

## 2015-05-24 NOTE — Telephone Encounter (Signed)
Please call Dr. Servando Snare so I can speak with him about this patient ASAP. Thanks

## 2015-05-24 NOTE — Telephone Encounter (Signed)
Pt scheduled appointment for 3:45pm today. Allene Dillon, CMA

## 2015-05-24 NOTE — Telephone Encounter (Signed)
Dr. Elease Hashimoto, before I call patient, would you be willing to see her today? I noticed you dont have any openings. Please advise. Thanks!

## 2015-05-24 NOTE — Telephone Encounter (Signed)
Probably needs GI refer.   Can try another antibiotic and see if she tolerates it.   Can schedule CT scan or OV.  Thanks.

## 2015-05-25 ENCOUNTER — Ambulatory Visit: Payer: Self-pay | Admitting: Family Medicine

## 2015-05-25 DIAGNOSIS — K5792 Diverticulitis of intestine, part unspecified, without perforation or abscess without bleeding: Secondary | ICD-10-CM | POA: Insufficient documentation

## 2015-05-25 MED ORDER — OMEPRAZOLE 20 MG PO CPDR: 20.0000 mg | DELAYED_RELEASE_CAPSULE | Freq: Every day | ORAL | Status: DC

## 2015-05-25 MED ORDER — CLINDAMYCIN HCL 300 MG PO CAPS: 300.0000 mg | ORAL_CAPSULE | Freq: Three times a day (TID) | ORAL | Status: DC

## 2015-05-25 MED ORDER — CIPROFLOXACIN HCL 500 MG PO TABS: 500.0000 mg | ORAL_TABLET | Freq: Two times a day (BID) | ORAL | Status: DC

## 2015-05-25 NOTE — Telephone Encounter (Signed)
The receptionist reports that Dr. Servando Snare is out of town and unreachable until Monday.    Thanks,   -Vernona Rieger

## 2015-05-25 NOTE — Telephone Encounter (Signed)
Spoke with patient. Will retry Cipro, just gave her heartburn. Will start Omeprazole with it.  Also add Clindamycin that she has tolerated previously.  Will call with results. Thanks.

## 2015-06-07 ENCOUNTER — Ambulatory Visit (INDEPENDENT_AMBULATORY_CARE_PROVIDER_SITE_OTHER): Payer: Medicare Other | Admitting: Family Medicine

## 2015-06-07 ENCOUNTER — Encounter: Payer: Self-pay | Admitting: Family Medicine

## 2015-06-07 VITALS — BP 120/66 | HR 68 | Temp 98.3°F | Resp 16 | Ht 61.0 in | Wt 170.0 lb

## 2015-06-07 DIAGNOSIS — Z13820 Encounter for screening for osteoporosis: Secondary | ICD-10-CM

## 2015-06-07 DIAGNOSIS — Z23 Encounter for immunization: Secondary | ICD-10-CM

## 2015-06-07 DIAGNOSIS — Z1239 Encounter for other screening for malignant neoplasm of breast: Secondary | ICD-10-CM | POA: Diagnosis not present

## 2015-06-07 DIAGNOSIS — K5732 Diverticulitis of large intestine without perforation or abscess without bleeding: Secondary | ICD-10-CM | POA: Diagnosis not present

## 2015-06-07 DIAGNOSIS — I10 Essential (primary) hypertension: Secondary | ICD-10-CM

## 2015-06-07 DIAGNOSIS — Z0000 Encounter for general adult medical examination without abnormal findings: Secondary | ICD-10-CM

## 2015-06-07 DIAGNOSIS — E785 Hyperlipidemia, unspecified: Secondary | ICD-10-CM | POA: Diagnosis not present

## 2015-06-07 NOTE — Progress Notes (Signed)
Patient ID: Alicia Mccann, female   DOB: Aug 10, 1933, 81 y.o.   MRN: [id number]        [name], Female    DOB: [birth year], 81 y.o.   MRN: [id number] Visit Date: 06/07/2015  Today's Provider: Lorie Phenix, MD   Chief Complaint  Patient presents with  . Medicare Wellness   Subjective:    Annual wellness visit  Alicia Mccann is a 81 y.o. female. She feels well. She reports exercising daily. She reports she is sleeping well.  06/09/14 CPE 06/23/03 Pap-WNL 10/13/11 Mammo-BI-RADS 2 06/25/05 Colon-WNL 02/08/08 BMD-WNL  Lab Results  Component Value Date   WBC 8.6 01/16/2009   HGB 13.3 02/08/2014   HCT 39 02/08/2014   PLT 359 02/08/2014   GLUCOSE 99 01/16/2009   ALT 28 02/08/2014   AST 17 02/08/2014   NA 137 02/08/2014   K 3.5 02/08/2014   CL 106 01/16/2009   CREATININE 1.60* 05/24/2015   BUN 24* 02/08/2014   CO2 28 01/16/2009   TSH 1.74 02/08/2014   INR 0.9 01/16/2009     Follow up for diverticulitis  The patient was last seen for this 3 weeks ago. Changes made at last visit include starting antibiotics.  She reports excellent compliance with treatment. She feels that condition is Improved. She is not having side effects.  Patient concerned about what diet changes she needs to make to prevent symptoms.  ------------------------------------------------------------------------------------   Review of Systems  Constitutional: Positive for fatigue.  HENT: Negative.   Eyes: Negative.   Respiratory: Negative.   Cardiovascular: Negative.   Gastrointestinal: Negative.   Endocrine: Negative.   Genitourinary: Negative.   Musculoskeletal: Positive for arthralgias.  Skin: Negative.   Allergic/Immunologic: Negative.   Neurological: Negative.   Hematological: Negative.   Psychiatric/Behavioral: Negative.     Social History   Social History  . Marital Status: Widowed    Spouse Name: N/A  . Number of Children: N/A  .  Years of Education: N/A   Occupational History  . Not on file.   Social History Main Topics  . Smoking status: Never Smoker   . Smokeless tobacco: Never Used  . Alcohol Use: No  . Drug Use: No  . Sexual Activity: Not on file   Other Topics Concern  . Not on file   Social History Narrative    Patient Active Problem List   Diagnosis Date Noted  . Diverticulitis 05/25/2015  . Abdominal pain 05/24/2015  . Allergic rhinitis 04/30/2015  . Absolute anemia 04/30/2015  . Anxiety 04/30/2015  . Adult BMI 30+ 04/30/2015  . Carpal tunnel syndrome 04/30/2015  . Chronic LBP 04/30/2015  . Edema extremities 04/30/2015  . Cannot sleep 04/30/2015  . Arthritis sicca 04/30/2015  . Seasonal affective disorder (HCC) 04/30/2015  . Heart valve disease 04/30/2015  . Encounter for preventive health examination 06/27/2014  . Heart murmur 06/27/2013  . Essential hypertension 06/27/2013  . Obesity 06/27/2013  . Osteoarthritis 06/27/2013  . Hyperlipidemia 06/27/2013  . Coxitis 07/08/2012  . History of repair of hip joint 07/08/2012  . Cholelithiasis without obstruction 03/22/2007    Past Surgical History  Procedure Laterality Date  . Hip surgery      bilateral   . Back surgery    . Gallbladder surgery    . Bunionectomy    . Cholecystectomy  03/29/2007  . Thumb arthroscopy      Her family history includes CAD in her father; Heart attack in her father; Heart attack (age  of onset: 20) in her son; Heart disease in her father and mother; Hypertension in her mother and son; Stroke in her father and mother.    Previous Medications   ACETAMINOPHEN (EXTRA STRENGTH ACETAMINOPHEN) 500 MG COAPSULE    Take 500 mg by mouth as needed for fever.   ALPRAZOLAM (XANAX) 0.25 MG TABLET    Take 0.25 mg by mouth at bedtime as needed.    AMLODIPINE (NORVASC) 2.5 MG TABLET    Take 1 tablet by mouth daily.   AMLODIPINE (NORVASC) 5 MG TABLET    Take 2.5 mg by mouth daily.    ASPIRIN 81 MG TABLET    Take 81 mg by  mouth daily.   CHOLECALCIFEROL (VITAMIN D) 1000 UNITS TABLET    Take 2,000 Units by mouth daily.   CYANOCOBALAMIN (VITAMIN B 12 PO)    Take 1,000 mg by mouth daily.   MELOXICAM (MOBIC) 7.5 MG TABLET    Take 7.5 mg by mouth daily as needed for pain.   METRONIDAZOLE (FLAGYL) 500 MG TABLET       OMEPRAZOLE (PRILOSEC) 20 MG CAPSULE    Take 1 capsule (20 mg total) by mouth daily.   TRAMADOL (ULTRAM) 50 MG TABLET    Take 50 mg by mouth every 6 (six) hours as needed.   TRIAMTERENE-HYDROCHLOROTHIAZIDE (MAXZIDE) 75-50 MG PER TABLET    TAKE ONE TABLET BY MOUTH EVERY DAY    Patient Care Team: Lorie Phenix, MD as PCP - General (Family Medicine)     Objective:   Vitals: There were no vitals taken for this visit.  Physical Exam  Constitutional: She is oriented to person, place, and time. She appears well-developed and well-nourished.  HENT:  Head: Normocephalic and atraumatic.  Right Ear: Tympanic membrane, external ear and ear canal normal.  Left Ear: Tympanic membrane, external ear and ear canal normal.  Nose: Nose normal.  Mouth/Throat: Uvula is midline, oropharynx is clear and moist and mucous membranes are normal.  Eyes: Conjunctivae, EOM and lids are normal. Pupils are equal, round, and reactive to light.  Neck: Trachea normal and normal range of motion. Neck supple. Carotid bruit is not present. No thyroid mass and no thyromegaly present.  Cardiovascular: Normal rate, regular rhythm and normal heart sounds.   Pulmonary/Chest: Effort normal and breath sounds normal.  Abdominal: Soft. Normal appearance and bowel sounds are normal. There is no hepatosplenomegaly. There is no tenderness.  Genitourinary: No breast swelling, tenderness or discharge.  Musculoskeletal: Normal range of motion.  Lymphadenopathy:    She has no cervical adenopathy.    She has no axillary adenopathy.  Neurological: She is alert and oriented to person, place, and time. She has normal strength. No cranial nerve  deficit.  Skin: Skin is warm, dry and intact.  Psychiatric: She has a normal mood and affect. Her speech is normal and behavior is normal. Judgment and thought content normal. Cognition and memory are normal.    Activities of Daily Living In your present state of health, do you have any difficulty performing the following activities: 06/07/2015  Hearing? N  Vision? Y  Difficulty concentrating or making decisions? Y  Walking or climbing stairs? Y  Dressing or bathing? N  Doing errands, shopping? N    Fall Risk Assessment Fall Risk  06/07/2015  Falls in the past year? No     Depression Screen PHQ 2/9 Scores 06/07/2015  PHQ - 2 Score 0    Cognitive Testing - 6-CIT  Correct? Score  What year is it? yes 0 0 or 4  What month is it? yes 0 0 or 3  Memorize:    Floyde Parkins[place],  [place],  44,  [address],  [place],      What time is it? (within 1 hour) yes 0 0 or 3  Count backwards from 20 yes 0 0, 2, or 4  Name the months of the year yes 0 0, 2, or 4  Repeat name & address above yes 0 0, 2, 4, 6, 8, or 10       TOTAL SCORE  0/28   Interpretation:  Normal  Normal (0-7) Abnormal (8-28)       Assessment & Plan:     Annual Wellness Visit  Reviewed patient's Family Medical History Reviewed and updated list of patient's medical providers Assessment of cognitive impairment was done Assessed patient's functional ability Established a written schedule for health screening services Health Risk Assessent Completed and Reviewed  Exercise Activities and Dietary recommendations Goals    . Exercise 150 minutes per week (moderate activity)       Immunization History  Administered Date(s) Administered  . Pneumococcal Conjugate-13 06/09/2014  . Pneumococcal Polysaccharide-23 06/18/2004  . Td 05/24/2007  . Zoster 01/28/2008    Health Maintenance  Topic Date Due  . DEXA SCAN  05/28/2000  . INFLUENZA VACCINE  03/19/2015  . TETANUS/TDAP  05/23/2017  . ZOSTAVAX  Completed  . PNA vac Low  Risk Adult  Completed     1. Medicare annual wellness visit, subsequent As above.    2. Need for influenza vaccination Given today. - Flu vaccine HIGH DOSE PF  3. Diverticulitis of large intestine without perforation or abscess without bleeding Check labs.  - CBC with Differential/Platelet  4. Screening for osteoporosis Will schedule.  - DG Bone Density; Future  5. Breast cancer screening Will call.  - MM DIGITAL SCREENING BILATERAL; Future  6. Hyperlipidemia Will check labs.  - Lipid Panel With LDL/HDL Ratio   7. Essential hypertension Stable. Will check labs. - Comprehensive metabolic panel - TSH Patient was seen and examined by Leo GrosserNancy J. Jaeleigh Monaco, MD, and note scribed by Rondel BatonSulibeya Dimas, CMA.  I have reviewed the document for accuracy and completeness and I agree with above. Leo Grosser- Geraldine Tesar J. Hiilei Gerst, MD   Lorie PhenixNancy Arrion Broaddus, MD    ------------------------------------------------------------------------------------------------------------

## 2015-06-15 ENCOUNTER — Other Ambulatory Visit: Payer: Self-pay | Admitting: Family Medicine

## 2015-06-15 DIAGNOSIS — I10 Essential (primary) hypertension: Secondary | ICD-10-CM

## 2015-06-18 ENCOUNTER — Other Ambulatory Visit: Payer: Self-pay | Admitting: Family Medicine

## 2015-06-18 DIAGNOSIS — I10 Essential (primary) hypertension: Secondary | ICD-10-CM

## 2015-06-20 ENCOUNTER — Telehealth: Payer: Self-pay

## 2015-06-20 LAB — CBC WITH DIFFERENTIAL/PLATELET
BASOS: 1 %
Basophils Absolute: 0.1 10*3/uL (ref 0.0–0.2)
EOS (ABSOLUTE): 0.4 10*3/uL (ref 0.0–0.4)
Eos: 5 %
HEMATOCRIT: 35.6 % (ref 34.0–46.6)
Hemoglobin: 12.4 g/dL (ref 11.1–15.9)
IMMATURE GRANS (ABS): 0 10*3/uL (ref 0.0–0.1)
Immature Granulocytes: 0 %
Lymphocytes Absolute: 2.2 10*3/uL (ref 0.7–3.1)
Lymphs: 28 %
MCH: 29.6 pg (ref 26.6–33.0)
MCHC: 34.8 g/dL (ref 31.5–35.7)
MCV: 85 fL (ref 79–97)
MONOS ABS: 0.6 10*3/uL (ref 0.1–0.9)
Monocytes: 8 %
NEUTROS ABS: 4.5 10*3/uL (ref 1.4–7.0)
Neutrophils: 58 %
PLATELETS: 316 10*3/uL (ref 150–379)
RBC: 4.19 x10E6/uL (ref 3.77–5.28)
RDW: 12.5 % (ref 12.3–15.4)
WBC: 7.9 10*3/uL (ref 3.4–10.8)

## 2015-06-20 LAB — LIPID PANEL WITH LDL/HDL RATIO
Cholesterol, Total: 200 mg/dL — ABNORMAL HIGH (ref 100–199)
HDL: 48 mg/dL (ref >39)
LDL Calculated: 117 mg/dL — ABNORMAL HIGH (ref 0–99)
LDL/HDL RATIO: 2.4 ratio (ref 0.0–3.2)
Triglycerides: 176 mg/dL — ABNORMAL HIGH (ref 0–149)
VLDL Cholesterol Cal: 35 mg/dL (ref 5–40)

## 2015-06-20 LAB — COMPREHENSIVE METABOLIC PANEL
ALT: 22 IU/L (ref 0–32)
AST: 20 IU/L (ref 0–40)
Albumin/Globulin Ratio: 1.9 (ref 1.1–2.5)
Albumin: 4.2 g/dL (ref 3.5–4.7)
Alkaline Phosphatase: 75 IU/L (ref 39–117)
BILIRUBIN TOTAL: 0.4 mg/dL (ref 0.0–1.2)
BUN/Creatinine Ratio: 25 (ref 11–26)
BUN: 28 mg/dL — ABNORMAL HIGH (ref 8–27)
CHLORIDE: 98 mmol/L (ref 97–106)
CO2: 24 mmol/L (ref 18–29)
Calcium: 10 mg/dL (ref 8.7–10.3)
Creatinine, Ser: 1.13 mg/dL — ABNORMAL HIGH (ref 0.57–1.00)
GFR, EST AFRICAN AMERICAN: 53 mL/min/{1.73_m2} — AB (ref >59)
GFR, EST NON AFRICAN AMERICAN: 46 mL/min/{1.73_m2} — AB (ref >59)
GLOBULIN, TOTAL: 2.2 g/dL (ref 1.5–4.5)
Glucose: 87 mg/dL (ref 65–99)
POTASSIUM: 3.5 mmol/L (ref 3.5–5.2)
Sodium: 138 mmol/L (ref 136–144)
TOTAL PROTEIN: 6.4 g/dL (ref 6.0–8.5)

## 2015-06-20 LAB — TSH: TSH: 2.75 u[IU]/mL (ref 0.450–4.500)

## 2015-06-20 NOTE — Telephone Encounter (Signed)
Patient advised as below. sd 

## 2015-06-20 NOTE — Telephone Encounter (Signed)
No answer/ mail box full.  Thanks,  -Leianna Barga

## 2015-06-20 NOTE — Telephone Encounter (Signed)
-----   Message from Lorie PhenixNancy Maloney, MD sent at 06/20/2015  6:36 AM EDT ----- Labs stable. Please notify patient. Thanks.

## 2015-06-28 ENCOUNTER — Ambulatory Visit
Admission: RE | Admit: 2015-06-28 | Discharge: 2015-06-28 | Disposition: A | Discharge status: Home / Self Care | Payer: Medicare Other | Source: Ambulatory Visit | Attending: Family Medicine | Admitting: Family Medicine

## 2015-06-28 ENCOUNTER — Other Ambulatory Visit: Payer: Self-pay | Admitting: Family Medicine

## 2015-06-28 DIAGNOSIS — M8580 Other specified disorders of bone density and structure, unspecified site: Secondary | ICD-10-CM | POA: Diagnosis not present

## 2015-06-28 DIAGNOSIS — Z13820 Encounter for screening for osteoporosis: Secondary | ICD-10-CM | POA: Insufficient documentation

## 2015-06-28 DIAGNOSIS — Z1239 Encounter for other screening for malignant neoplasm of breast: Secondary | ICD-10-CM

## 2015-06-28 DIAGNOSIS — Z1231 Encounter for screening mammogram for malignant neoplasm of breast: Secondary | ICD-10-CM | POA: Diagnosis not present

## 2015-07-02 ENCOUNTER — Telehealth: Payer: Self-pay

## 2015-07-02 NOTE — Telephone Encounter (Signed)
Mailbox full  Thanks,  -Joseline

## 2015-07-02 NOTE — Telephone Encounter (Signed)
Pt advised as below. Patient verbalizes understanding and is in agreement with treatment plan.  

## 2015-07-02 NOTE — Telephone Encounter (Signed)
-----   Message from Lorie PhenixNancy Maloney, MD sent at 07/01/2015  2:42 PM EST ----- Bone thinning, not osteoporosis.  Eat healthy and exercise and recheck in 2 years. Thanks.

## 2015-07-13 ENCOUNTER — Other Ambulatory Visit: Payer: Self-pay | Admitting: Family Medicine

## 2015-08-16 ENCOUNTER — Ambulatory Visit: Payer: Medicare Other | Admitting: Nurse Practitioner

## 2015-09-19 ENCOUNTER — Ambulatory Visit (INDEPENDENT_AMBULATORY_CARE_PROVIDER_SITE_OTHER): Payer: Medicare Other | Admitting: Nurse Practitioner

## 2015-09-19 ENCOUNTER — Encounter: Payer: Self-pay | Admitting: Nurse Practitioner

## 2015-09-19 VITALS — BP 166/79 | HR 93 | Ht 61.0 in | Wt 170.1 lb

## 2015-09-19 DIAGNOSIS — I38 Endocarditis, valve unspecified: Secondary | ICD-10-CM | POA: Diagnosis not present

## 2015-09-19 DIAGNOSIS — I10 Essential (primary) hypertension: Secondary | ICD-10-CM

## 2015-09-19 NOTE — Patient Instructions (Signed)
Medication Instructions:  None  Labwork: None  Testing/Procedures: None  Follow-Up: 1 year with Dr. Mariah Milling   Any Other Special Instructions Will Be Listed Below (If Applicable).     If you need a refill on your cardiac medications before your next appointment, please call your pharmacy.

## 2015-09-19 NOTE — Progress Notes (Signed)
Office Visit    Patient Name: Alicia Mccann Date of Encounter: 09/19/2015  Primary Care Provider:  Lorie Phenix, MD Primary Cardiologist:  Concha Se, MD   Chief Complaint    82 year old female with a history of hypertension and systolic murmur, who presents for follow-up.  Past Medical History    Past Medical History  Diagnosis Date  . Osteoarthritis   . Cellulitis   . Anxiety   . Tachycardia   . Allergic rhinitis   . Insomnia   . Essential hypertension   . Anemia   . Valvular heart disease     a. 12/2008 Echo: EF 65%, mild MR;  b. 2/6 SEM RUSB - insignificant murmur, prob Ao Sclerosis.  . Cholelithiasis   . History of stress test     a. 12/2008 Ex MV: EF 78%, no ischemia.   Past Surgical History  Procedure Laterality Date  . Hip surgery      bilateral   . Back surgery    . Gallbladder surgery    . Bunionectomy    . Cholecystectomy  03/29/2007  . Thumb arthroscopy      Allergies  Allergies  Allergen Reactions  . Albumen, Egg   . Eggs Or Egg-Derived Products   . Levaquin [Levofloxacin]   . Milk-Related Compounds   . Penicillins   . Shellfish Allergy   . Sulfa Antibiotics   . Wheat Bran     History of Present Illness    82 year old female with the above complex past medical history. She was last seen by Dr. Mariah Milling just over one year ago. She has a history of systolic murmur with echocardiogram in 2010 showing normal LV function with mild mitral regurgitation. She also had a stress test at that time which was negative for ischemia. Over the years, she has done very well. She is very active, saying she is always on the go. She lives by herself as her husband previously died with complications related to heart failure and LVAD treatment (developed infection prior to death). She denies any history of chest pain, palpitations, dyspnea, PND, orthopnea, dizziness, syncope, edema, or early satiety. She says her blood pressure usually runs in the 120s to 130s when  she seen for office visits. Blood pressure was initially elevated today at 166/79 but was 126/70 on repeat.  Home Medications    Prior to Admission medications   Medication Sig Start Date End Date Taking? Authorizing Provider  Acetaminophen (EXTRA STRENGTH ACETAMINOPHEN) 500 MG coapsule Take 500 mg by mouth as needed for fever.   Yes Historical Provider, MD  ALPRAZolam Prudy Feeler) 0.25 MG tablet Take 0.25 mg by mouth at bedtime as needed.  06/12/14  Yes Historical Provider, MD  amLODipine (NORVASC) 2.5 MG tablet TAKE ONE TABLET BY MOUTH EVERY DAY 06/15/15  Yes Lorie Phenix, MD  aspirin 81 MG tablet Take 81 mg by mouth daily.   Yes Historical Provider, MD  cholecalciferol (VITAMIN D) 1000 UNITS tablet Take 2,000 Units by mouth every other day.    Yes Historical Provider, MD  Cyanocobalamin (VITAMIN B 12 PO) Take 1,000 mg by mouth every other day.    Yes Historical Provider, MD  meloxicam (MOBIC) 7.5 MG tablet TAKE ONE TABLET TWICE DAILY Patient taking differently: TAKE ONE TABLET DAILT AS NEEDED FOR PAIN 07/13/15  Yes Lorie Phenix, MD  traMADol (ULTRAM) 50 MG tablet Take 50 mg by mouth every 6 (six) hours as needed.   Yes Historical Provider, MD  triamterene-hydrochlorothiazide (MAXZIDE) 75-50  MG tablet TAKE ONE TABLET BY MOUTH EVERY DAY 06/18/15  Yes Lorie Phenix, MD    Review of Systems    She denies chest pain, palpitations, dyspnea, pnd, orthopnea, n, v, dizziness, syncope, edema, weight gain, or early satiety. All other systems reviewed and are otherwise negative except as noted above.  Physical Exam    VS:  BP 166/79 mmHg, 126/70 on repeat  Pulse 93  Ht  (1.549 m)  Wt 170 lb 1.9 oz (77.166 kg)  BMI 32.16 kg/m2 , BMI Body mass index is 32.16 kg/(m^2). GEN: Well nourished, well developed, in no acute distress. HEENT: normal. Neck: Supple, no JVD, carotid bruits, or masses. Cardiac: RRR, 2/6 systolic ejection murmur at the right and left upper sternal borders. no rubs, or  gallops. No clubbing, cyanosis, edema.  Radials/DP/PT 2+ and equal bilaterally.  Respiratory:  Respirations regular and unlabored, clear to auscultation bilaterally. GI: Soft, nontender, nondistended, BS + x 4. MS: no deformity or atrophy. Skin: warm and dry, no rash. Neuro:  Strength and sensation are intact. Psych: Normal affect.  Accessory Clinical Findings    Regular sinus rhythm, 88, no acute ST or T changes.   Assessment & Plan    1.  Essential hypertension: Patient remains on amlodipine and triamterene-HCTZ therapy. Blood pressure was initially elevated after being walked in from the waiting room but repeat blood pressure was normal at 126/70. No adjustments to medications today.  2. Valvular heart disease/systolic murmur: Patient has a long history of systolic murmur previously evaluated by echocardiogram in 2010 revealing mild mitral regurgitation. She does not have any murmur at the apex. The murmur on exam is at the upper sternal border and likely represents an insignificant murmur such as aortic sclerosis. She denies any history of chest pain, syncope/presyncope, or dyspnea. No further evaluation at this time.  3. Disposition: Patient will follow-up with Dr. Mariah Milling in one year or sooner if necessary.   Nicolasa Ducking, NP 09/19/2015, 12:02 PM

## 2015-12-06 ENCOUNTER — Encounter: Payer: Self-pay | Admitting: Family Medicine

## 2015-12-06 ENCOUNTER — Ambulatory Visit (INDEPENDENT_AMBULATORY_CARE_PROVIDER_SITE_OTHER): Payer: Medicare Other | Admitting: Family Medicine

## 2015-12-06 VITALS — BP 126/60 | HR 78 | Temp 97.9°F | Resp 16 | Ht 61.0 in | Wt 172.0 lb

## 2015-12-06 DIAGNOSIS — I10 Essential (primary) hypertension: Secondary | ICD-10-CM | POA: Diagnosis not present

## 2015-12-06 DIAGNOSIS — M159 Polyosteoarthritis, unspecified: Secondary | ICD-10-CM

## 2015-12-06 DIAGNOSIS — F419 Anxiety disorder, unspecified: Secondary | ICD-10-CM

## 2015-12-06 MED ORDER — ALPRAZOLAM 0.25 MG PO TABS: 0.2500 mg | ORAL_TABLET | Freq: Every evening | ORAL | Status: DC | PRN

## 2015-12-06 MED ORDER — TRIAMTERENE-HCTZ 75-50 MG PO TABS: 1.0000 | ORAL_TABLET | Freq: Every day | ORAL | Status: DC

## 2015-12-06 MED ORDER — AMLODIPINE BESYLATE 2.5 MG PO TABS: 2.5000 mg | ORAL_TABLET | Freq: Every day | ORAL | Status: DC

## 2015-12-06 MED ORDER — MELOXICAM 7.5 MG PO TABS: ORAL_TABLET | ORAL | Status: DC

## 2015-12-06 NOTE — Progress Notes (Signed)
Patient ID: Alicia Mccann, female   DOB: 04/20/1933, 82 y.o.   MRN: [id number]       [name] Female    DOB: 06/12/1933   82 y.o.   MRN: [id number] Visit Date: 12/06/2015  Today's Provider: Lorie Phenix, MD   Chief Complaint  Patient presents with  . Hypertension   Subjective:    HPI  Hypertension, follow-up:  BP Readings from Last 3 Encounters:  12/06/15 126/60  09/19/15 166/79  06/07/15 120/66    She was last seen for hypertension 6 months ago.  BP at that visit was 166/79. Management changes since that visit include . She reports excellent compliance with treatment. She is not having side effects.  She is exercising. She is adherent to low salt diet.   Outside blood pressures are stable. She is experiencing none.  Patient denies chest pain.   Cardiovascular risk factors include advanced age (older than 60 for men, 69 for women).  Use of agents associated with hypertension: none.     Weight trend: stable Wt Readings from Last 3 Encounters:  12/06/15 172 lb (78.019 kg)  09/19/15 170 lb 1.9 oz (77.166 kg)  06/07/15 170 lb (77.111 kg)    Current diet: in general, a "healthy" diet    Meloxicam is helping her arthritis and would like a refill.    Also, anxiety is stable.  Would like a refill of her medication.  Tolerates medication well.          Allergies  Allergen Reactions  . Albumen, Egg   . Eggs Or Egg-Derived Products   . Levaquin [Levofloxacin]   . Milk-Related Compounds   . Penicillins   . Shellfish Allergy   . Sulfa Antibiotics   . Wheat Bran    Previous Medications   ACETAMINOPHEN (EXTRA STRENGTH ACETAMINOPHEN) 500 MG COAPSULE    Take 500 mg by mouth as needed for fever.   ALPRAZOLAM (XANAX) 0.25 MG TABLET    Take 0.25 mg by mouth at bedtime as needed.    AMLODIPINE (NORVASC) 2.5 MG TABLET    TAKE ONE TABLET BY MOUTH EVERY DAY   ASPIRIN 81 MG TABLET    Take 81 mg by mouth daily.   CHOLECALCIFEROL (VITAMIN D) 1000  UNITS TABLET    Take 2,000 Units by mouth every other day.    CYANOCOBALAMIN (VITAMIN B 12 PO)    Take 1,000 mg by mouth every other day.    MELOXICAM (MOBIC) 7.5 MG TABLET    TAKE ONE TABLET TWICE DAILY   TRIAMTERENE-HYDROCHLOROTHIAZIDE (MAXZIDE) 75-50 MG TABLET    TAKE ONE TABLET BY MOUTH EVERY DAY    Review of Systems  Constitutional: Negative.   Respiratory: Negative for chest tightness.   Cardiovascular: Positive for leg swelling.  Endocrine: Negative.     Social History  Substance Use Topics  . Smoking status: Never Smoker   . Smokeless tobacco: Never Used  . Alcohol Use: No   Objective:   BP 126/60 mmHg  Pulse 78  Temp(Src) 97.9 F (36.6 C) (Oral)  Resp 16  Ht  (1.549 m)  Wt 172 lb (78.019 kg)  BMI 32.52 kg/m2  SpO2 98%  Physical Exam  Constitutional: She is oriented to person, place, and time. She appears well-developed and well-nourished.  Cardiovascular: Normal rate and regular rhythm.   Pulmonary/Chest: Effort normal and breath sounds normal.  Neurological: She is alert and oriented to person, place, and time.  Psychiatric: She has a normal mood  and affect. Her behavior is normal. Judgment and thought content normal.      Assessment & Plan:     1. Essential hypertension Stable. Patient advised to continue current medications and plan of care. - triamterene-hydrochlorothiazide (MAXZIDE) 75-50 MG tablet; Take 1 tablet by mouth daily.  Dispense: 90 tablet; Refill: 3 - amLODipine (NORVASC) 2.5 MG tablet; Take 1 tablet (2.5 mg total) by mouth daily.  Dispense: 90 tablet; Refill: 3  2. Osteoarthritis of multiple joints, unspecified osteoarthritis type Stable. Patient advised to continue current medication and plan of care. - meloxicam (MOBIC) 7.5 MG tablet; TAKE ONE TABLET DAILT AS NEEDED FOR PAIN  Dispense: 60 tablet; Refill: 11  3. Anxiety Stable. Patient advised to continue current medication and plan of care. - ALPRAZolam (XANAX) 0.25 MG tablet; Take 1  tablet (0.25 mg total) by mouth at bedtime as needed.  Dispense: 30 tablet; Refill: 5     Patient seen and examined by Dr. Leo GrosserNancy J.. Haralambos Yeatts, and note scribed by Liz BeachSulibeya S. Dimas, CMA.  I have reviewed the document for accuracy and completeness and I agree with above. - Leo GrosserNancy J. Cheryle Dark, MD   Lorie PhenixNancy Florian Chauca, MD  [facility]

## 2015-12-24 ENCOUNTER — Telehealth: Payer: Self-pay | Admitting: Family Medicine

## 2015-12-24 DIAGNOSIS — M159 Polyosteoarthritis, unspecified: Secondary | ICD-10-CM

## 2015-12-24 NOTE — Telephone Encounter (Signed)
Pt called wanting a new referrral [facility] [address]  Its for her knees , hips.  She says she has talked to you about it and has had a referral a while back Her call back is [phone]  [facility]   [employer]

## 2015-12-24 NOTE — Telephone Encounter (Signed)
OK to refer. Thanks.

## 2015-12-24 NOTE — Telephone Encounter (Signed)
Order entered.   Thanks,   -Laura  

## 2015-12-27 ENCOUNTER — Ambulatory Visit: Payer: Medicare Other | Admitting: Physical Therapy

## 2015-12-28 ENCOUNTER — Ambulatory Visit: Payer: Medicare Other | Attending: Family Medicine | Admitting: Physical Therapy

## 2015-12-28 ENCOUNTER — Encounter: Payer: Self-pay | Admitting: Physical Therapy

## 2015-12-28 DIAGNOSIS — M25561 Pain in right knee: Secondary | ICD-10-CM | POA: Diagnosis present

## 2015-12-28 DIAGNOSIS — M545 Low back pain, unspecified: Secondary | ICD-10-CM

## 2015-12-28 DIAGNOSIS — M6281 Muscle weakness (generalized): Secondary | ICD-10-CM | POA: Insufficient documentation

## 2015-12-28 DIAGNOSIS — M25562 Pain in left knee: Secondary | ICD-10-CM

## 2015-12-28 NOTE — Therapy (Signed)
[facility] [address] [place], [address] Phone: [phone]   Fax:  [phone]  Physical Therapy Evaluation  Patient Details  Name: Alicia Mccann MRN: [id number] Date of Birth: 1932/11/24 Referring Provider: Lorie Phenix, MD  Encounter Date: 12/28/2015      PT End of Session - 12/28/15 1305    Visit Number 1   Number of Visits 12   Date for PT Re-Evaluation 02/08/16   Authorization Type 1   Authorization Time Period 10 (G-codes)   PT Start Time Dec 22, 1122   PT Stop Time 1213-12-21   PT Time Calculation (min) 51 min   Activity Tolerance Patient tolerated treatment well   Behavior During Therapy [facility] for tasks assessed/performed      Past Medical History  Diagnosis Date  . Osteoarthritis   . Cellulitis   . Anxiety   . Tachycardia   . Allergic rhinitis   . Insomnia   . Essential hypertension   . Anemia   . Valvular heart disease     a. 12-21-08 Echo: EF 65%, mild MR;  b. 2/6 SEM RUSB - insignificant murmur, prob Ao Sclerosis.  . Cholelithiasis   . History of stress test     a. 21-Dec-2008 Ex MV: EF 78%, no ischemia.    Past Surgical History  Procedure Laterality Date  . Hip surgery      bilateral   . Back surgery    . Gallbladder surgery    . Bunionectomy    . Cholecystectomy  03/29/2007  . Thumb arthroscopy      There were no vitals filed for this visit.       Subjective Assessment - 12/28/15 1135    Subjective Pt presents with right and left knee pain and low back pain; back pain worsened on 12/18/15 when gardening in her lawn and knee pain has worsened over the past year. Pt reports a  5/10 pain of the right knee, a  2/10 and a back pain 4/10. Patient states increased low back pain with prolonged sitting, bending, lifting, standing, and walking. Patient reports significant difficulty with ascending/descending stairs and states pain is worse with activity and throuhgout the day. Medication  (NSAIDS) decreases pain and  Patient reports not using ice or heat to decrease symptoms.     Pertinent History Increased knee and lumbar pain for over 10 years: Acute exaccerbation started 12/18/15 Pafter pulling weeds and gardening states no change since aggravation of symptoms.    Diagnostic tests X-Ray: osteoarthrits in the R and L knee    Patient Stated Goals To move better and improve dull ache in the back and knees.    Currently in Pain? Yes   Pain Score 5   low back pain: 4/10; left knee pain 2/10   Pain Location Knee   Pain Orientation Right   Pain Descriptors / Indicators Aching;Dull   Pain Type Chronic pain   Pain Onset More than a month ago   Pain Frequency Constant   Aggravating Factors  walking, standing, ascending/desending stairs            [facility] PT Assessment - 12/28/15 1132    Assessment   Medical Diagnosis M15.9 Osteoarthritis of multiple joints, unspecified osteoarthritis type   Referring Provider Lorie Phenix, MD   Onset Date/Surgical Date 12/18/15   Hand Dominance Right   Next MD Visit 2015/12/22   Prior Therapy yes   Precautions   Precautions None   Balance  Screen   Has the patient fallen in the past 6 months No   Has the patient had a decrease in activity level because of a fear of falling?  No   Is the patient reluctant to leave their home because of a fear of falling?  No   Home Nurse, mental health Private residence   Living Arrangements Alone   Available Help at Discharge Friend(s)   Type of Home House   Home Access Stairs to enter   Entrance Stairs-Number of Steps 3   Entrance Stairs-Rails Right   Home Layout One level   Prior Function   Level of Independence Independent   Vocation Retired   Gaffer N/A   Leisure Walking the dogs, going out to eat with friends   Cognition   Overall Cognitive Status Within Functional Limits for tasks assessed      Objective: Palpation: Increased spasms and tenderness along Right LE   quadriceps, left LE hamstrings, Bilateral gluteus maximus, and lumbar extensors  Measurement:   Lumbar AROM/MMT: WNL for all motions -- slight increase in pain when returning to standing from standing. R Knee AROM/MMT: Flexion: 120; 5/5 , Extension: 0 4-/5 L Knee AROM/MMT: Flexion 120 5/5 , Extension: 0 4-/5 R Hip AROM/MMT: Flexion:3+/5, Extension:4+/5, ABD:4-/5, ER:4-/5, L Hip AROM/MMT: Flexion:3+/5, Extension:4-/5, ABD:4-/5, ER:4-/5,  Nerve Exam: MMT: L1/L2:3+/5, L3/L4:4-/5, L5:5/5, S1:5/5 -- bilaterally; Sensation: No significant decrease in sensation bilaterally for all nerve roots; Reflexes: L4:WNL, S1:WNL  Special Testing: Crossed straight leg raise: (-) Active Straight leg raise: (-) Multifidus activation: decrease on R vs L TrA activation: fair activation  Outcome Measures:  LEFS: 28/80 (Moderate-severe self perceived dysfunction) MODI: 28% (Moderate self perceived dysfunction)  Therapeutic Exercise: Patient performed exercises with guidance, verbal and tactile cues and demonstration of therapist: Clamshells in sitting with green band -- x15 Ball squeezes/glute squeezes in sitting with ball -- x15 Clamshells in sidelying -- x 15  Manual Therapy:  STM utilizing superficial techniques to hip extensors and lumbar extensions with patient in prone to decrease pain and spasms.  Patient response to treatment: Decrease pain and spasms decreased by 25% after performing manual therapy indicating decreased muscle guarding. Good demonstration of ball/glute squeezes in sitting requiring minimal verbal cueing to perform with proper form/technique.         PT Education - 12/28/15 1304    Education provided Yes   Education Details HEP: clamshells in sitting with green band, ball squeeze/glute squeeze, clamshell in sidelying. Educated on POC and expected results.    Person(s) Educated Patient   Methods Explanation;Demonstration   Comprehension Returned demonstration;Verbalized  understanding             PT Long Term Goals - 12/28/15 1307    PT LONG TERM GOAL #1   Title Pt will score a 45/80 on the LEFS score by 02/08/16 to demonstrate significant improvement in lower extremity function and improved ability to ambulate without increased pain.   Baseline LEFS 28/80 (moderate-severe self perceived lower extremity dysfunction)   Status New   PT LONG TERM GOAL #2   Title Pt will score a <18%  on the MODI by 02/08/16 to demonstrate significant improvement in low back function and improved ability to perform bending/lifting activities.    Baseline MODI: 28%(Moderate self perceived low back dysfunction)   Status New   PT LONG TERM GOAL #3   Title Pt will be independent with exercise performance and progresion of exercises focused on improving Lumbar and LE  weakness and pain by 02/08/16 to allow for improvement of symptoms after discharge of therapy.   Baseline Dependent for exercise performance and progression.    Status New               Plan - 12/28/15 1334    Clinical Impression Statement Pt is a 82 yo right hand dominant female presenting with bilateral knee pain and low back pain. Patient demonstrates increased lumbar and knee dysfunction indicated by her LEFS(28/80 severe-moderate self perceived dysfunction) and MODI (28% moderate self perceived dysfunction) outcome scores, increased pain with functional activities such as walking, standing, and bending, and decreased MMT scores. Patient demonstrates impairments in motor control, muscular endurance, and muscular strength with the hip, knee, and lumbar musculature and will benefit from further skilled therapy to return to prior level of function.    Rehab Potential Good   Clinical Impairments Affecting Rehab Potential (-): age, chronicity of condition; (+) highly motivated   PT Frequency 2x / week   PT Duration 6 weeks   PT Treatment/Interventions Moist Heat;Iontophoresis 4mg /ml Dexamethasone;Electrical  Stimulation;Cryotherapy;Ultrasound;Therapeutic activities;Therapeutic exercise;Manual techniques;Patient/family education;Neuromuscular re-education   PT Next Visit Plan Progression LE and lumbar stabilization exercise   PT Home Exercise Plan clamshells in sitting and sidelying, ball squeeze/glute squeeze   Consulted and Agree with Plan of Care Patient      Patient will benefit from skilled therapeutic intervention in order to improve the following deficits and impairments:  Decreased mobility, Decreased strength, Pain, Increased muscle spasms, Increased fascial restricitons, Hypomobility, Decreased coordination, Decreased endurance  Visit Diagnosis: Pain in right knee - Plan: PT plan of care cert/re-cert  Pain in left knee - Plan: PT plan of care cert/re-cert  Bilateral low back pain without sciatica - Plan: PT plan of care cert/re-cert  Muscle weakness (generalized) - Plan: PT plan of care cert/re-cert      G-Codes - 12/28/15 1400    Functional Assessment Tool Used modified oswestry, LEFS, pain scale, clinical judgment, strength deficits   Functional Limitation Mobility: Walking and moving around   Mobility: Walking and Moving Around Current Status ([id number]) At least 20 percent but less than 40 percent impaired, limited or restricted   Mobility: Walking and Moving Around Goal Status (U0454(G8979) At least 1 percent but less than 20 percent impaired, limited or restricted       Problem List Patient Active Problem List   Diagnosis Date Noted  . Valvular heart disease   . Diverticulitis 05/25/2015  . Abdominal pain 05/24/2015  . Allergic rhinitis 04/30/2015  . Absolute anemia 04/30/2015  . Anxiety 04/30/2015  . Adult BMI 30+ 04/30/2015  . Carpal tunnel syndrome 04/30/2015  . Chronic LBP 04/30/2015  . Edema extremities 04/30/2015  . Cannot sleep 04/30/2015  . Arthritis sicca 04/30/2015  . Seasonal affective disorder (HCC) 04/30/2015  . Heart valve disease 04/30/2015  . Encounter  for preventive health examination 06/27/2014  . Heart murmur 06/27/2013  . Essential hypertension 06/27/2013  . Obesity 06/27/2013  . Osteoarthritis 06/27/2013  . Hyperlipidemia 06/27/2013  . Coxitis 07/08/2012  . History of repair of hip joint 07/08/2012  . Cholelithiasis without obstruction 03/22/2007    Myrene GalasWesley Onofre Gains, SPT 12/28/2015, 2:14 PM  [facility] [facility] [address] [place], [place], [address] Phone: [phone]   Fax:  [phone]  Name: Alicia Mccann MRN: [id number] Date of Birth: Jan 12, 1933

## 2016-01-02 ENCOUNTER — Ambulatory Visit: Payer: Medicare Other | Admitting: Physical Therapy

## 2016-01-04 ENCOUNTER — Ambulatory Visit: Payer: Medicare Other | Admitting: Physical Therapy

## 2016-01-04 ENCOUNTER — Encounter: Payer: Self-pay | Admitting: Physical Therapy

## 2016-01-04 DIAGNOSIS — M25562 Pain in left knee: Secondary | ICD-10-CM

## 2016-01-04 DIAGNOSIS — M25561 Pain in right knee: Secondary | ICD-10-CM | POA: Diagnosis not present

## 2016-01-04 DIAGNOSIS — M545 Low back pain, unspecified: Secondary | ICD-10-CM

## 2016-01-04 DIAGNOSIS — M6281 Muscle weakness (generalized): Secondary | ICD-10-CM

## 2016-01-04 NOTE — Therapy (Signed)
[facility] [address], [place], [address] Phone: [phone]   Fax:  [phone]  Physical Therapy Treatment  Patient Details  Name: Alicia Mccann MRN: [id number] Date of Birth: 1933/08/10 Referring Provider: Lorie Phenix, MD  Encounter Date: 01/04/2016      PT End of Session - 01/04/16 1106    Visit Number 2   Number of Visits 12   Date for PT Re-Evaluation 02/08/16   Authorization Type 2   Authorization Time Period 10 (G-codes)   PT Start Time 1106   PT Stop Time 1155   PT Time Calculation (min) 49 min   Activity Tolerance Patient tolerated treatment well   Behavior During Therapy [facility] for tasks assessed/performed      Past Medical History  Diagnosis Date  . Osteoarthritis   . Cellulitis   . Anxiety   . Tachycardia   . Allergic rhinitis   . Insomnia   . Essential hypertension   . Anemia   . Valvular heart disease     a. 12/2008 Echo: EF 65%, mild MR;  b. 2/6 SEM RUSB - insignificant murmur, prob Ao Sclerosis.  . Cholelithiasis   . History of stress test     a. 12/2008 Ex MV: EF 78%, no ischemia.    Past Surgical History  Procedure Laterality Date  . Hip surgery      bilateral   . Back surgery    . Gallbladder surgery    . Bunionectomy    . Cholecystectomy  03/29/2007  . Thumb arthroscopy      There were no vitals filed for this visit.      Subjective Assessment - 01/04/16 1103    Subjective Patient reports pain in back and both knees and as the day goes on she has more pain.    Limitations Sitting;Standing;Walking;House hold activities   Diagnostic tests X-Ray: osteoarthrits in the R and L knee    Patient Stated Goals To move better and improve dull ache in the back and knees.    Currently in Pain? Yes   Pain Score 2    Pain Location Knee   Pain Orientation Other (Comment)  both knees, back   Pain Descriptors / Indicators Aching   Pain Type Chronic pain   Pain Onset  More than a month ago   Pain Frequency Constant     Objective: Gait: mild antalgic pattern with guarded posture  Treatment:  Exercise: patient performed exercises with guidance, verbal and tactile cues as needed and demonstration of PT:  Supine hook lying: bridging through partial ROM with ball between knees x 10 Side lying : clamshells with tactile cues for proper positioning, 10 reps each side, hip abduction with tactile cues each LE 10 reps each Standing: hip abduction and extension alternating each LE; 5 reps 3 sets each NuStep: (unbilled): seated x 10 min. Level #3  Modalities:  With patient supine lying with LE's supported on pillow High volt electrical stimulation applied to lower back and both knees with moist heat to same x ~20 min.: clinical protocol for muscle spasms, intensity to tolerance: goals: pain, muscle spasms reduction  Patient response to treatment: patient able to perform exercises with good technique following demonstration and with verbal/tactile cuing; patient reported decreased pain in lower back and both knees, allowing improved gait pattern with decreased guarding and reported decreased reported stiffness to mild/none at end of session; no adverse reactions noted following moist heat/stim. treatment  PT Education - 01/04/16 1408    Education provided Yes   Education Details HEP: added standing hip abduction, extension, bridging and side lying abduction hip   Person(s) Educated Patient   Methods Explanation;Demonstration;Verbal cues;Tactile cues;Handout   Comprehension Verbalized understanding;Returned demonstration;Verbal cues required;Tactile cues required             PT Long Term Goals - 12/28/15 1307    PT LONG TERM GOAL #1   Title Pt will score a 45/80 on the LEFS score by 02/08/16 to demonstrate significant improvement in lower extremity function and improved ability to ambulate without increased pain.   Baseline LEFS 28/80  (moderate-severe self perceived lower extremity dysfunction)   Status New   PT LONG TERM GOAL #2   Title Pt will score a <18%  on the MODI by 02/08/16 to demonstrate significant improvement in low back function and improved ability to perform bending/lifting activities.    Baseline MODI: 28%(Moderate self perceived low back dysfunction)   Status New   PT LONG TERM GOAL #3   Title Pt will be independent with exercise performance and progresion of exercises focused on improving Lumbar and LE weakness and pain by 02/08/16 to allow for improvement of symptoms after discharge of therapy.   Baseline Dependent for exercise performance and progression.    Status New               Plan - 01/04/16 1403    Clinical Impression Statement Patient demonstrated good understanding of home exercises following demonstration and with assistance of verbal and tactile cuing. She reported decreased pain in back and both knees with improved gait pattern and less stiffness at end of session.    Rehab Potential Good   PT Frequency 2x / week   PT Duration 6 weeks   PT Treatment/Interventions Moist Heat;Iontophoresis 4mg /ml Dexamethasone;Electrical Stimulation;Cryotherapy;Ultrasound;Therapeutic activities;Therapeutic exercise;Manual techniques;Patient/family education;Neuromuscular re-education   PT Next Visit Plan pain control, progressive exercises for stabilizaiton and LE strengthening   PT Home Exercise Plan bridging, clamshells, hip abduction side lying, standing and hip extension standing      Patient will benefit from skilled therapeutic intervention in order to improve the following deficits and impairments:  Decreased mobility, Decreased strength, Pain, Increased muscle spasms, Increased fascial restricitons, Hypomobility, Decreased coordination, Decreased endurance  Visit Diagnosis: Pain in right knee  Pain in left knee  Bilateral low back pain without sciatica  Muscle weakness  (generalized)     Problem List Patient Active Problem List   Diagnosis Date Noted  . Valvular heart disease   . Diverticulitis 05/25/2015  . Abdominal pain 05/24/2015  . Allergic rhinitis 04/30/2015  . Absolute anemia 04/30/2015  . Anxiety 04/30/2015  . Adult BMI 30+ 04/30/2015  . Carpal tunnel syndrome 04/30/2015  . Chronic LBP 04/30/2015  . Edema extremities 04/30/2015  . Cannot sleep 04/30/2015  . Arthritis sicca 04/30/2015  . Seasonal affective disorder (HCC) 04/30/2015  . Heart valve disease 04/30/2015  . Encounter for preventive health examination 06/27/2014  . Heart murmur 06/27/2013  . Essential hypertension 06/27/2013  . Obesity 06/27/2013  . Osteoarthritis 06/27/2013  . Hyperlipidemia 06/27/2013  . Coxitis 07/08/2012  . History of repair of hip joint 07/08/2012  . Cholelithiasis without obstruction 03/22/2007    Beacher MayBrooks, Marie PT 01/04/2016, 2:09 PM  [facility] [address] [place], [place], [address] Phone: [phone]   Fax:  [phone]  Name: Alicia Mccann MRN: [id number] Date of Birth: 1933-06-22

## 2016-01-07 ENCOUNTER — Encounter: Payer: Self-pay | Admitting: Physical Therapy

## 2016-01-07 ENCOUNTER — Ambulatory Visit: Payer: Medicare Other | Admitting: Physical Therapy

## 2016-01-07 DIAGNOSIS — M25561 Pain in right knee: Secondary | ICD-10-CM

## 2016-01-07 DIAGNOSIS — M545 Low back pain, unspecified: Secondary | ICD-10-CM

## 2016-01-07 DIAGNOSIS — M6281 Muscle weakness (generalized): Secondary | ICD-10-CM

## 2016-01-07 DIAGNOSIS — M25562 Pain in left knee: Secondary | ICD-10-CM

## 2016-01-07 NOTE — Therapy (Signed)
[place] [facility] [address], [place], [address] Phone: [phone]   Fax:  [phone]  Physical Therapy Treatment  Patient Details  Name: RYONNA CIMINI MRN: [id number] Date of Birth: 08-22-1932 Referring Provider: Lorie Phenix, MD  Encounter Date: 01/07/2016      PT End of Session - 01/07/16 1115    Visit Number 3   Number of Visits 12   Date for PT Re-Evaluation 02/08/16   Authorization Type 3   Authorization Time Period 10 (G-codes)   PT Start Time Dec 27, 1037   PT Stop Time 1120   PT Time Calculation (min) 41 min   Activity Tolerance Patient tolerated treatment well   Behavior During Therapy [facility] for tasks assessed/performed      Past Medical History  Diagnosis Date  . Osteoarthritis   . Cellulitis   . Anxiety   . Tachycardia   . Allergic rhinitis   . Insomnia   . Essential hypertension   . Anemia   . Valvular heart disease     a. Dec 27, 2008 Echo: EF 65%, mild MR;  b. 2/6 SEM RUSB - insignificant murmur, prob Ao Sclerosis.  . Cholelithiasis   . History of stress test     a. 27-Dec-2008 Ex MV: EF 78%, no ischemia.    Past Surgical History  Procedure Laterality Date  . Hip surgery      bilateral   . Back surgery    . Gallbladder surgery    . Bunionectomy    . Cholecystectomy  03/29/2007  . Thumb arthroscopy      There were no vitals filed for this visit.      Subjective Assessment - 01/07/16 1052    Subjective Patient reports pain in back and both knees and as the day goes on she has more pain. She is running a little late today due to traffic. She reports she had good relief of back and knee pain with high volt estim. Last session   Pertinent History Increased knee and lumbar pain for over 10 years: Acute exaccerbation started 12/18/15 Pafter pulling weeds and gardening states no change since aggravation of symptoms.    Limitations Sitting;Standing;Walking;House hold activities   Patient Stated  Goals To move better and improve dull ache in the back and knees.    Currently in Pain? Yes   Pain Score 5    Pain Location Other (Comment)  both knees and back   Pain Onset More than a month ago      Objective: Gait: mild antalgic pattern with guarded posture  Treatment:  Exercise: patient performed exercises with guidance, verbal and tactile cues as needed and demonstration of PT:  Supine hook lying: bridging through partial ROM with ball between knees x 10 Side lying : clamshells with tactile cues for proper positioning, 10 reps each side, hip abduction with tactile cues each LE 10 reps each Standing: hip abduction side stepping; 5 steps 3 sets each  Modalities:  With patient supine lying with LE's supported on pillow High volt electrical stimulation applied to lower back and both knees with moist heat to same x ~20 min.: clinical protocol for muscle spasms, intensity to tolerance: goals: pain, muscle spasms reduction  Patient response to treatment: patient able to perform exercises with good technique following demonstration and with verbal/tactile cuing; patient reported decreased pain in lower back and both knees, allowing improved gait pattern with decreased guarding and reported decreased reported stiffness from 5/10 to 0/10  at end of session; no adverse reactions noted following moist heat/stim. treatment         PT Long Term Goals - 12/28/15 1307    PT LONG TERM GOAL #1   Title Pt will score a 45/80 on the LEFS score by 02/08/16 to demonstrate significant improvement in lower extremity function and improved ability to ambulate without increased pain.   Baseline LEFS 28/80 (moderate-severe self perceived lower extremity dysfunction)   Status New   PT LONG TERM GOAL #2   Title Pt will score a <18%  on the MODI by 02/08/16 to demonstrate significant improvement in low back function and improved ability to perform bending/lifting activities.    Baseline MODI: 28%(Moderate  self perceived low back dysfunction)   Status New   PT LONG TERM GOAL #3   Title Pt will be independent with exercise performance and progresion of exercises focused on improving Lumbar and LE weakness and pain by 02/08/16 to allow for improvement of symptoms after discharge of therapy.   Baseline Dependent for exercise performance and progression.    Status New               Plan - 01/07/16 1117    Clinical Impression Statement Patient demonstrated good response to exercises and modalities with decreased pain and improved abiltiy to perform exercises with less cuing. She continues with pain and weakness as primary limitations to improved function and will benefit from additional physical therapy intervention to address these in order to achieve goals.    Rehab Potential Good   PT Frequency 2x / week   PT Duration 6 weeks   PT Treatment/Interventions Moist Heat;Iontophoresis 4mg /ml Dexamethasone;Electrical Stimulation;Cryotherapy;Ultrasound;Therapeutic activities;Therapeutic exercise;Manual techniques;Patient/family education;Neuromuscular re-education   PT Next Visit Plan pain control, progressive exercises for stabilizaiton and LE strengthening   PT Home Exercise Plan bridging, clamshells, hip abduction side lying, standing and hip extension standing      Patient will benefit from skilled therapeutic intervention in order to improve the following deficits and impairments:  Decreased mobility, Decreased strength, Pain, Increased muscle spasms, Increased fascial restricitons, Hypomobility, Decreased coordination, Decreased endurance  Visit Diagnosis: Pain in right knee  Pain in left knee  Bilateral low back pain without sciatica  Muscle weakness (generalized)     Problem List Patient Active Problem List   Diagnosis Date Noted  . Valvular heart disease   . Diverticulitis 05/25/2015  . Abdominal pain 05/24/2015  . Allergic rhinitis 04/30/2015  . Absolute anemia 04/30/2015   . Anxiety 04/30/2015  . Adult BMI 30+ 04/30/2015  . Carpal tunnel syndrome 04/30/2015  . Chronic LBP 04/30/2015  . Edema extremities 04/30/2015  . Cannot sleep 04/30/2015  . Arthritis sicca 04/30/2015  . Seasonal affective disorder (HCC) 04/30/2015  . Heart valve disease 04/30/2015  . Encounter for preventive health examination 06/27/2014  . Heart murmur 06/27/2013  . Essential hypertension 06/27/2013  . Obesity 06/27/2013  . Osteoarthritis 06/27/2013  . Hyperlipidemia 06/27/2013  . Coxitis 07/08/2012  . History of repair of hip joint 07/08/2012  . Cholelithiasis without obstruction 03/22/2007    Beacher MayBrooks, Marie PT 01/07/2016, 9:16 PM  [place] [facility] [address] [place], [place], [address] Phone: [phone]   Fax:  [phone]  Name: Lula OlszewskiJacqueline L Enge MRN: [id number] Date of Birth: Jul 20, 1933

## 2016-01-11 ENCOUNTER — Ambulatory Visit: Payer: Medicare Other | Admitting: Physical Therapy

## 2016-01-11 ENCOUNTER — Encounter: Payer: Self-pay | Admitting: Physical Therapy

## 2016-01-11 DIAGNOSIS — M6281 Muscle weakness (generalized): Secondary | ICD-10-CM

## 2016-01-11 DIAGNOSIS — M25562 Pain in left knee: Secondary | ICD-10-CM

## 2016-01-11 DIAGNOSIS — M545 Low back pain, unspecified: Secondary | ICD-10-CM

## 2016-01-11 DIAGNOSIS — M25561 Pain in right knee: Secondary | ICD-10-CM | POA: Diagnosis not present

## 2016-01-11 NOTE — Therapy (Signed)
[place] [facility] [address] [place], [address] Phone: [phone]   Fax:  [phone]  Physical Therapy Treatment  Patient Details  Name: Alicia Mccann MRN: [id number] Date of Birth: [birth year] Referring Provider: Lorie Phenix, MD  Encounter Date: 01/11/2016      PT End of Session - 01/11/16 1205    Visit Number 4   Number of Visits 12   Date for PT Re-Evaluation 02/08/16   Authorization Type 4   Authorization Time Period 10 (G-codes)   PT Start Time 1046   PT Stop Time 1140   PT Time Calculation (min) 54 min   Activity Tolerance Patient tolerated treatment well   Behavior During Therapy [facility] for tasks assessed/performed      Past Medical History  Diagnosis Date  . Osteoarthritis   . Cellulitis   . Anxiety   . Tachycardia   . Allergic rhinitis   . Insomnia   . Essential hypertension   . Anemia   . Valvular heart disease     a. 12/2008 Echo: EF 65%, mild MR;  b. 2/6 SEM RUSB - insignificant murmur, prob Ao Sclerosis.  . Cholelithiasis   . History of stress test     a. 12/2008 Ex MV: EF 78%, no ischemia.    Past Surgical History  Procedure Laterality Date  . Hip surgery      bilateral   . Back surgery    . Gallbladder surgery    . Bunionectomy    . Cholecystectomy  03/29/2007  . Thumb arthroscopy      There were no vitals filed for this visit.      Subjective Assessment - 01/11/16 1103    Subjective Patient reports she continues with difficulty with walking up steps due to right knee pain. She is having pain in both knees today. She reports having increased bilateral knee stiffness and back/knee pain following prolonged sitting. She reports that bridging with ball bothers her as well and does not do this exercise.    Limitations Sitting;Standing;Walking;House hold activities   Patient Stated Goals To move better and improve dull ache in the back and knees.    Currently in Pain? Yes    Pain Score 5    Pain Location Other (Comment)  both knees and back   Pain Orientation Other (Comment)  both knnes, back   Pain Descriptors / Indicators Aching   Pain Onset More than a month ago   Pain Frequency Constant      Objective: Gait: mild antalgic pattern with guarded posture  Treatment:  Exercise: patient performed exercises with guidance, verbal and tactile cues as needed and demonstration of PT:  Sitting: clamshells with green resistive band x 15 reps hold 5 seconds at end range Standing: hip abduction side stepping; green resistive band around thighs x 2 min. Standing: demonstrated hip abduction and extension x 5 reps each LE with alternating LE's, diagonal wedding march x 2 sets of ~10 steps each along 25', holding (2) 2# dumbbells Sit to stand on airex balance pad x 10 reps (with ball between x ~5 reps with increased knee pain reported, with green band around thighs x 5 reps)  Modalities:  Electrical stimulation;  With patient supine lying with LE's supported on pillow High volt electrical stimulation  applied to lower back and both knees with moist heat to same x ~20 min.: clinical protocol for muscle spasms, intensity to tolerance: goals: pain, muscle spasms reduction  Patient response to treatment: patient able to perform exercises with good technique following demonstration and with verbal/tactile cuing; patient reported decreased pain in lower back and both knees, allowing improved gait pattern with decreased limp and reported decreased stiffness from 5/10 to 2/10 at end of session; no adverse reactions noted following moist heat/stim. treatment        PT Education - 01/11/16 1139    Education provided Yes   Education Details HEP: wedding march 2 min./day, discontinue bridging exercise   Person(s) Educated Patient   Methods Demonstration;Verbal cues;Handout   Comprehension Verbalized understanding;Returned demonstration;Verbal cues required              PT Long Term Goals - 12/28/15 1307    PT LONG TERM GOAL #1   Title Pt will score a 45/80 on the LEFS score by 02/08/16 to demonstrate significant improvement in lower extremity function and improved ability to ambulate without increased pain.   Baseline LEFS 28/80 (moderate-severe self perceived lower extremity dysfunction)   Status New   PT LONG TERM GOAL #2   Title Pt will score a <18%  on the MODI by 02/08/16 to demonstrate significant improvement in low back function and improved ability to perform bending/lifting activities.    Baseline MODI: 28%(Moderate self perceived low back dysfunction)   Status New   PT LONG TERM GOAL #3   Title Pt will be independent with exercise performance and progresion of exercises focused on improving Lumbar and LE weakness and pain by 02/08/16 to allow for improvement of symptoms after discharge of therapy.   Baseline Dependent for exercise performance and progression.    Status New               Plan - 01/11/16 1206    Clinical Impression Statement Much improved with decreased knee and back pain following treatment. She continues with difficulty rising from prolonged sitting due to knee stiffness and weakness and will benefit from continued physical therapy to address pain and weakness limitations in order to improve function with daily tasks.    Rehab Potential Good   PT Frequency 2x / week   PT Duration 6 weeks   PT Treatment/Interventions Moist Heat;Iontophoresis /ml Dexamethasone;Electrical Stimulation;Cryotherapy;Ultrasound;Therapeutic activities;Therapeutic exercise;Manual techniques;Patient/family education;Neuromuscular re-education   PT Next Visit Plan pain control, progressive exercises for stabilizaiton and LE strengthening   PT Home Exercise Plan  standing and hip extension and abduction standing,       Patient will benefit from skilled therapeutic intervention in order to improve the following deficits and  impairments:  Decreased mobility, Decreased strength, Pain, Increased muscle spasms, Increased fascial restricitons, Hypomobility, Decreased coordination, Decreased endurance  Visit Diagnosis: Pain in right knee  Pain in left knee  Bilateral low back pain without sciatica  Muscle weakness (generalized)     Problem List Patient Active Problem List   Diagnosis Date Noted  . Valvular heart disease   . Diverticulitis 05/25/2015  . Abdominal pain 05/24/2015  . Allergic rhinitis 04/30/2015  . Absolute anemia 04/30/2015  . Anxiety 04/30/2015  . Adult BMI 30+ 04/30/2015  . Carpal tunnel syndrome 04/30/2015  . Chronic LBP 04/30/2015  . Edema extremities 04/30/2015  . Cannot sleep 04/30/2015  . Arthritis sicca 04/30/2015  . Seasonal affective disorder (HCC) 04/30/2015  . Heart valve disease 04/30/2015  . Encounter for preventive health examination 06/27/2014  . Heart murmur 06/27/2013  . Essential hypertension 06/27/2013  . Obesity 06/27/2013  . Osteoarthritis 06/27/2013  . Hyperlipidemia 06/27/2013  . Coxitis 07/08/2012  .  History of repair of hip joint 07/08/2012  . Cholelithiasis without obstruction 03/22/2007    Beacher MayBrooks, Marie PT 01/11/2016, 12:17 PM  [place] [facility] [address] [place], [place], [address] Phone: [phone]   Fax:  [phone]  Name: Lula OlszewskiJacqueline L Carbin MRN: [id number] Date of Birth: [birth year]

## 2016-01-16 ENCOUNTER — Encounter: Payer: Self-pay | Admitting: Physical Therapy

## 2016-01-16 ENCOUNTER — Ambulatory Visit: Payer: Medicare Other | Admitting: Physical Therapy

## 2016-01-16 DIAGNOSIS — M545 Low back pain, unspecified: Secondary | ICD-10-CM

## 2016-01-16 DIAGNOSIS — M25561 Pain in right knee: Secondary | ICD-10-CM

## 2016-01-16 DIAGNOSIS — M6281 Muscle weakness (generalized): Secondary | ICD-10-CM

## 2016-01-16 DIAGNOSIS — M25562 Pain in left knee: Secondary | ICD-10-CM

## 2016-01-16 NOTE — Therapy (Signed)
[place] [facility] [address] [place], [place], [address] Phone: [phone]   Fax:  [phone]  Physical Therapy Treatment  Patient Details  Name: Alicia Mccann MRN: [id number] Date of Birth: 17-May-1933 Referring Provider: Lorie PhenixNancy Maloney, MD  Encounter Date: 01/16/2016      PT End of Session - 01/16/16 0949    Visit Number 5   Number of Visits 12   Date for PT Re-Evaluation 02/08/16   Authorization Type 5   Authorization Time Period 10 (G-codes)   PT Start Time [phone]   PT Stop Time 1032   PT Time Calculation (min) 45 min   Activity Tolerance Patient tolerated treatment well   Behavior [facility] Therapy [facility] for tasks assessed/performed      Past Medical History  Diagnosis Date  . Osteoarthritis   . Cellulitis   . Anxiety   . Tachycardia   . Allergic rhinitis   . Insomnia   . Essential hypertension   . Anemia   . Valvular heart disease     a. 12/2008 Echo: EF 65%, mild MR;  b. 2/6 SEM RUSB - insignificant murmur, prob Ao Sclerosis.  . Cholelithiasis   . History of stress test     a. 12/2008 Ex MV: EF 78%, no ischemia.    Past Surgical History  Procedure Laterality Date  . Hip surgery      bilateral   . Back surgery    . Gallbladder surgery    . Bunionectomy    . Cholecystectomy  03/29/2007  . Thumb arthroscopy      There were no vitals filed for this visit.      Subjective Assessment - 01/16/16 0948    Subjective "my back and knees feel better" Patient reports she was able to go shopping Monday and did well. Her knees and back have a dull aching pain all the time and is much better than when she began therapy.    Limitations Sitting;Standing;Walking;House hold activities   Patient Stated Goals To move better and improve dull ache in the back and knees.    Currently in Pain? No/denies  basic dull pain is always present in back and knees      Objective: Gait: mild antalgic pattern with guarded  posture: decreased as compared to previous session  Treatment:  Exercise: patient performed exercises with guidance, verbal and tactile cues as needed and demonstration of PT:  Sitting: clamshells with green resistive band x 15 reps hold 5 seconds at end range Standing: hip abduction side stepping; green resistive band around thighs x 2 min. With close supervision x 1 Standing:  diagonal wedding march x 2 sets of ~10 steps each along 25', holding (2) 2# dumbbells Sit to stand on airex balance pad 3 x  5 reps ( with green band around thighs)  Modalities:  Electrical stimulation;  With patient semi reclined with LE's supported on pillow High volt electrical stimulation, clinical protocol for muscles spasms, applied to lower back and both knees with moist heat to same x ~20 min.: clinical protocol for muscle spasms, intensity to tolerance: goals: pain, muscle spasms reduction  Patient response to treatment: patient able to perform exercises with good technique with minimal verbal cuing; patient reported decreased pain in lower back and both knees, allowing improved gait pattern with decreased limp and reported decreased stiffness to none; no adverse reactions noted following moist heat/stim. treatment         PT Long Term  Goals - 12/28/15 1307    PT LONG TERM GOAL #1   Title Pt will score a 45/80 on the LEFS score by 02/08/16 to demonstrate significant improvement in lower extremity function and improved ability to ambulate without increased pain.   Baseline LEFS 28/80 (moderate-severe self perceived lower extremity dysfunction)   Status New   PT LONG TERM GOAL #2   Title Pt will score a <18%  on the MODI by 02/08/16 to demonstrate significant improvement in low back function and improved ability to perform bending/lifting activities.    Baseline MODI: 28%(Moderate self perceived low back dysfunction)   Status New   PT LONG TERM GOAL #3   Title Pt will be independent with exercise  performance and progresion of exercises focused on improving Lumbar and LE weakness and pain by 02/08/16 to allow for improvement of symptoms after discharge of therapy.   Baseline Dependent for exercise performance and progression.    Status New               Plan - 01/16/16 1509    Clinical Impression Statement Patient demonstrates significant improvement in pain and ability to walk and perform community ambulation with mild to no pain or difficulty. She is responding well to current treatment and requires minimal cuing to perfomr all exercises with good technique. She should continue to improve with pain control modalities and progressive exercises to allow self management of symptoms with possible decrease to 1x/week.    Rehab Potential Good   PT Frequency 2x / week   PT Duration 6 weeks   PT Treatment/Interventions Moist Heat;Iontophoresis /ml Dexamethasone;Electrical Stimulation;Cryotherapy;Ultrasound;Therapeutic activities;Therapeutic exercise;Manual techniques;Patient/family education;Neuromuscular re-education   PT Next Visit Plan pain control, progressive exercises for stabilizaiton and LE strengthening   PT Home Exercise Plan  standing and hip extension and abduction standing, clamshells sitting, hip adduction with ball, wedding march diagonals      Patient will benefit from skilled therapeutic intervention in order to improve the following deficits and impairments:  Decreased mobility, Decreased strength, Pain, Increased muscle spasms, Increased fascial restricitons, Hypomobility, Decreased coordination, Decreased endurance  Visit Diagnosis: Pain in right knee  Pain in left knee  Bilateral low back pain without sciatica  Muscle weakness (generalized)     Problem List Patient Active Problem List   Diagnosis Date Noted  . Valvular heart disease   . Diverticulitis 05/25/2015  . Abdominal pain 05/24/2015  . Allergic rhinitis 04/30/2015  . Absolute anemia  04/30/2015  . Anxiety 04/30/2015  . Adult BMI 30+ 04/30/2015  . Carpal tunnel syndrome 04/30/2015  . Chronic LBP 04/30/2015  . Edema extremities 04/30/2015  . Cannot sleep 04/30/2015  . Arthritis sicca 04/30/2015  . Seasonal affective disorder (HCC) 04/30/2015  . Heart valve disease 04/30/2015  . Encounter for preventive health examination 06/27/2014  . Heart murmur 06/27/2013  . Essential hypertension 06/27/2013  . Obesity 06/27/2013  . Osteoarthritis 06/27/2013  . Hyperlipidemia 06/27/2013  . Coxitis 07/08/2012  . History of repair of hip joint 07/08/2012  . Cholelithiasis without obstruction 03/22/2007    Beacher May PT 01/16/2016, 3:10 PM  [place] [facility] PH[facility] AND SPORTS MEDICINE [address] [place], [address] Phone: [phone]   Fax:  [phone]  Name: Alicia Mccann MRN: [id number] Date of Birth: 1933-07-16

## 2016-01-18 ENCOUNTER — Ambulatory Visit: Payer: Medicare Other | Admitting: Physical Therapy

## 2016-01-21 ENCOUNTER — Ambulatory Visit: Payer: Medicare Other | Admitting: Physical Therapy

## 2016-01-23 ENCOUNTER — Ambulatory Visit: Payer: Medicare Other | Admitting: Physical Therapy

## 2016-01-29 ENCOUNTER — Ambulatory Visit: Payer: Medicare Other | Admitting: Physical Therapy

## 2016-02-01 ENCOUNTER — Ambulatory Visit: Payer: Medicare Other | Admitting: Physical Therapy

## 2016-02-05 ENCOUNTER — Encounter: Payer: Medicare Other | Admitting: Physical Therapy

## 2016-02-08 ENCOUNTER — Encounter: Payer: Medicare Other | Admitting: Physical Therapy

## 2016-02-12 ENCOUNTER — Encounter: Payer: Medicare Other | Admitting: Physical Therapy

## 2016-02-15 ENCOUNTER — Encounter: Payer: Medicare Other | Admitting: Physical Therapy

## 2016-04-08 ENCOUNTER — Other Ambulatory Visit: Payer: Self-pay | Admitting: Physician Assistant

## 2016-04-08 ENCOUNTER — Telehealth: Payer: Self-pay | Admitting: Physician Assistant

## 2016-04-08 DIAGNOSIS — M545 Low back pain, unspecified: Secondary | ICD-10-CM

## 2016-04-08 DIAGNOSIS — M25561 Pain in right knee: Secondary | ICD-10-CM

## 2016-04-08 DIAGNOSIS — M6281 Muscle weakness (generalized): Secondary | ICD-10-CM

## 2016-04-08 DIAGNOSIS — M25562 Pain in left knee: Principal | ICD-10-CM

## 2016-04-08 NOTE — Telephone Encounter (Signed)
Alicia Mccann was taking physical therapy ordered by Dr. Elease HashimotoMaloney but had to stop due to an issue with her dog and now she wants to start physical therapy again.  She needs an order sent.   [facility] in front of [address]

## 2016-04-08 NOTE — Progress Notes (Signed)
Placing order for PT for bilateral knee pain, back pain and muscle weakness. [facility] but had to stop for personal reasons now wanting to start back

## 2016-04-08 NOTE — Telephone Encounter (Signed)
Order placed

## 2016-04-24 ENCOUNTER — Encounter: Payer: Self-pay | Admitting: Physical Therapy

## 2016-04-24 ENCOUNTER — Ambulatory Visit: Payer: Medicare Other | Attending: Physician Assistant | Admitting: Physical Therapy

## 2016-04-24 DIAGNOSIS — M25562 Pain in left knee: Secondary | ICD-10-CM | POA: Insufficient documentation

## 2016-04-24 DIAGNOSIS — M6281 Muscle weakness (generalized): Secondary | ICD-10-CM | POA: Diagnosis present

## 2016-04-24 DIAGNOSIS — M25561 Pain in right knee: Secondary | ICD-10-CM

## 2016-04-24 DIAGNOSIS — M545 Low back pain, unspecified: Secondary | ICD-10-CM

## 2016-04-25 NOTE — Therapy (Signed)
[facility] PH[facility]OR[facility] MEDICINE [address], [place], [address] Phone: [phone]   Fax:  [phone]  Physical Therapy Evaluation  Patient Details  Name: Alicia Mccann MRN: [id number] Date of Birth: 20-Jun-1933 Referring Provider: Joycelyn Man PA-C  Encounter Date: 04/24/2016      PT End of Session - 04/24/16 1640    Visit Number 1   Number of Visits 12   Date for PT Re-Evaluation 06/05/16   Authorization Type 1   Authorization Time Period 10 (G-codes)   PT Start Time 1520   PT Stop Time 1615   PT Time Calculation (min) 55 min   Activity Tolerance Patient tolerated treatment well   Behavior During Therapy [facility] for tasks assessed/performed      Past Medical History:  Diagnosis Date  . Allergic rhinitis   . Anemia   . Anxiety   . Cellulitis   . Cholelithiasis   . Essential hypertension   . History of stress test    a. 12/2008 Ex MV: EF 78%, no ischemia.  . Insomnia   . Osteoarthritis   . Tachycardia   . Valvular heart disease    a. 12/2008 Echo: EF 65%, mild MR;  b. 2/6 SEM RUSB - insignificant murmur, prob Ao Sclerosis.    Past Surgical History:  Procedure Laterality Date  . BACK SURGERY    . BUNIONECTOMY    . CHOLECYSTECTOMY  03/29/2007  . GALLBLADDER SURGERY    . HIP SURGERY     bilateral   . THUMB ARTHROSCOPY      There were no vitals filed for this visit.       Subjective Assessment - 04/24/16 1534    Subjective patient reports she has been hurting in her back and knees since her dog had surgery June 2017 and she had to care for 20# dog x ~6 weeks. her worst pain is in her right knee and lower back.   Pertinent History Increased knee and lumbar pain for over 10 years: Acute exacerbation started 12/18/15 Pafter pulling weeds and gardening and then having taken care of her dog afeter surgery, she reports having Synvisc injections in August right knee without reults. states no change since  aggravation of symptoms.    Limitations Sitting;Standing;Walking;House hold activities   Diagnostic tests X-Ray: osteoarthrits in the R and L knee    Patient Stated Goals To move better and improve dull ache in the back and knees.    Currently in Pain? Yes   Pain Score 8    Pain Location Knee  and back   Pain Orientation Right;Left   Pain Descriptors / Indicators Aching   Pain Type Chronic pain   Pain Onset More than a month ago            [facility] PT Assessment - 04/24/16 1532      Assessment   Medical Diagnosis Knee pain, bilateral, muscle weakness (generalized), midline low back pain without sciatica   Referring Provider Margaretann Loveless., PA-C   Onset Date/Surgical Date 12/18/15   Hand Dominance Right   Next MD Visit unknown   Prior Therapy yes     Precautions   Precautions None     Balance Screen   Has the patient fallen in the past 6 months No   Has the patient had a decrease in activity level because of a fear of falling?  No   Is the patient reluctant to leave their home because of  a fear of falling?  No     Home Nurse, mental health Private residence   Living Arrangements Alone   Available Help at Discharge Friend(s)   Type of Home House   Home Access Stairs to enter   Entrance Stairs-Number of Steps 3   Entrance Stairs-Rails Right   Home Layout One level     Prior Function   Level of Independence Independent   Vocation Retired   Gaffer N/A   Leisure Walking the dogs, going out to eat with friends     Cognition   Overall Cognitive Status Within Functional Limits for tasks assessed      Objective; Gait: antalgic gait pattern, decreased trunk rotation, increased forward trunk flexion  Measurement:  Lumbar AROM/MMT: grossly minimal decrease flexion, extension, lateral flexion with mild pain. R Knee AROM/MMT: Flexion: 120; 4-/5 , Extension: WNL 4-/5 L Knee AROM/MMT: Flexion 120 4-/5 , Extension: 0 4-/5 R Hip AROM/MMT:  Flexion:3+/5,  ABD:4-/5, ER: grossly decreased strength L Hip AROM/MMT: Flexion:3+/5,  ABD:4-/5, ER: grossly decreased strength   Outcome Measures:  LEFS: 20/64 (Moderate-severe self perceived dysfunction) MODI: 35% (Moderate self perceived dysfunction)  Treatment:  Modalities; Electrical stimulation: high volt muscle spasm clinical program: applied electrodes to bilateral knees; bilateral lower back with patient long sitting and moist heat applied to same x 20 min (no adverse effects noted following treatment)   Patient response to treatment: improved ability to transfer off treatment table with less difficulty and pain in back and both knees by 50% per patient report        PT Education - 04/24/16 1539    Education provided Yes   Education Details HEP: hip adduction, abduction using resistive band   Person(s) Educated Patient   Methods Explanation;Verbal cues;Demonstration   Comprehension Verbalized understanding;Returned demonstration;Verbal cues required             PT Long Term Goals - 04/24/16 1650      PT LONG TERM GOAL #1   Title Pt will score a 45/64 on the LEFS score by 06/05/16 to demonstrate significant improvement in lower extremity function and improved ability to ambulate without increased pain.   Baseline LEFS 20/64 (moderate-severe self perceived lower extremity dysfunction)   Status New     PT LONG TERM GOAL #2   Title Pt will score a <25%  on the MODI by 06/05/16 to demonstrate significant improvement in low back function and improved ability to perform bending/lifting activities.    Baseline MODI: 35%(Moderate self perceived low back dysfunction)   Status New     PT LONG TERM GOAL #3   Title Pt will be independent with exercise performance and progresion of exercises focused on improving Lumbar and LE weakness and pain by 06/05/16 to allow for improvement of symptoms after discharge of therapy.   Baseline limited knowledge of appropriate exercise  performance and progression.    Status New               Plan - 04/24/16 1639    Clinical Impression Statement Patient is a 82 year old female who presents with bilateral knee and back pain that has worsened since having to care for her dog following surgery 01/2016. She has limitations with walking, sitting and performing daily tasks at home and in community. She has limited knowledge of appropriate exercises and progression in order to improve function with daily tasks and return to prior level of function.    Rehab Potential Good  Clinical Impairments Affecting Rehab Potential (-): age, chronicity of condition; (+) highly motivated   PT Frequency 2x / week   PT Duration 6 weeks   PT Treatment/Interventions Moist Heat;Iontophoresis 4mg /ml Dexamethasone;Electrical Stimulation;Cryotherapy;Ultrasound;Therapeutic activities;Therapeutic exercise;Manual techniques;Patient/family education;Neuromuscular re-education   PT Next Visit Plan pain control, progressive exercises for stabilizaiton and LE strengthening   PT Home Exercise Plan seated hip adduction with ball, glute sets, hip abduction with resistive band, quad sets   Consulted and Agree with Plan of Care Patient      Patient will benefit from skilled therapeutic intervention in order to improve the following deficits and impairments:  Decreased strength, Impaired flexibility, Pain, Decreased activity tolerance, Difficulty walking, Impaired perceived functional ability  Visit Diagnosis: Pain in right knee - Plan: PT plan of care cert/re-cert  Pain in left knee - Plan: PT plan of care cert/re-cert  Bilateral low back pain without sciatica - Plan: PT plan of care cert/re-cert  Muscle weakness (generalized) - Plan: PT plan of care cert/re-cert      G-Codes - 04/24/16 1630    Functional Assessment Tool Used modified oswestry, LEFS, pain scale, clinical judgment, strength deficits   Functional Limitation Mobility: Walking and  moving around   Mobility: Walking and Moving Around Current Status ([id number]) At least 20 percent but less than 40 percent impaired, limited or restricted   Mobility: Walking and Moving Around Goal Status [phone](G8979) At least 1 percent but less than 20 percent impaired, limited or restricted       Problem List Patient Active Problem List   Diagnosis Date Noted  . Valvular heart disease   . Diverticulitis 05/25/2015  . Abdominal pain 05/24/2015  . Allergic rhinitis 04/30/2015  . Absolute anemia 04/30/2015  . Anxiety 04/30/2015  . Adult BMI 30+ 04/30/2015  . Carpal tunnel syndrome 04/30/2015  . Chronic LBP 04/30/2015  . Edema extremities 04/30/2015  . Cannot sleep 04/30/2015  . Arthritis sicca 04/30/2015  . Seasonal affective disorder (HCC) 04/30/2015  . Heart valve disease 04/30/2015  . Encounter for preventive health examination 06/27/2014  . Heart murmur 06/27/2013  . Essential hypertension 06/27/2013  . Obesity 06/27/2013  . Osteoarthritis 06/27/2013  . Hyperlipidemia 06/27/2013  . Coxitis 07/08/2012  . History of repair of hip joint 07/08/2012  . Cholelithiasis without obstruction 03/22/2007    Beacher MayBrooks, Adryan Shin PT 04/25/2016, 10:34 PM  [facility] [address] , [place], [address] Phone: [phone]   Fax:  [phone]  Name: Lula OlszewskiJacqueline L Simm MRN: [id number] Date of Birth: 07-02-1933

## 2016-04-29 ENCOUNTER — Ambulatory Visit: Payer: Medicare Other | Admitting: Physical Therapy

## 2016-04-29 ENCOUNTER — Encounter: Payer: Self-pay | Admitting: Physical Therapy

## 2016-04-29 DIAGNOSIS — M25561 Pain in right knee: Secondary | ICD-10-CM | POA: Diagnosis not present

## 2016-04-29 DIAGNOSIS — M25562 Pain in left knee: Secondary | ICD-10-CM

## 2016-04-29 DIAGNOSIS — M6281 Muscle weakness (generalized): Secondary | ICD-10-CM

## 2016-04-29 DIAGNOSIS — M545 Low back pain, unspecified: Secondary | ICD-10-CM

## 2016-04-29 NOTE — Therapy (Addendum)
[facility] [address] [place], [place], [address] Phone: [phone]   Fax:  [phone]  Physical Therapy Treatment  Patient Details  Name: Alicia OlszewskiJacqueline L Mccann MRN: [id number] Date of Birth: 1932/10/05 Referring Provider: Joycelyn ManBurnette, Jennifer NP  Encounter Date: 04/29/2016      PT End of Session - 04/29/16 1658    Visit Number 2   Number of Visits 12   Date for PT Re-Evaluation 06/05/16   Authorization Type 2   Authorization Time Period 10 (G-codes)   PT Start Time 1620   PT Stop Time 1715   PT Time Calculation (min) 55 min   Activity Tolerance Patient tolerated treatment well   Behavior During Therapy [facility] for tasks assessed/performed      Past Medical History:  Diagnosis Date  . Allergic rhinitis   . Anemia   . Anxiety   . Cellulitis   . Cholelithiasis   . Essential hypertension   . History of stress test    a. 12/2008 Ex MV: EF 78%, no ischemia.  . Insomnia   . Osteoarthritis   . Tachycardia   . Valvular heart disease    a. 12/2008 Echo: EF 65%, mild MR;  b. 2/6 SEM RUSB - insignificant murmur, prob Ao Sclerosis.    Past Surgical History:  Procedure Laterality Date  . BACK SURGERY    . BUNIONECTOMY    . CHOLECYSTECTOMY  03/29/2007  . GALLBLADDER SURGERY    . HIP SURGERY     bilateral   . THUMB ARTHROSCOPY      There were no vitals filed for this visit.      Subjective Assessment - 04/29/16 1621    Subjective Today is a better day than the other day. pain is intermittent. Patient states she was with less pain following previous session. Pain level 5/10 both knees, lower back. Pain is intermittent in knees and back.    Limitations Sitting;Standing;Walking;House hold activities   Patient Stated Goals To move better and improve dull ache in the back and knees.      Objective; Gait: ambulating with mild antalgic gait pattern    Treatment: Therapeutic exercise: patient performed  exercises with guidance, verbal and tactile cues and demonstration of PT: Sitting; Hip adduction with ball and glute sets x 10 Hip abduction with resistive band x 20 Knee extension, flexion on ball x 25 reps each LE LAQ 2# x 15 reps each LE, knee flexion red resistive band x 15 reps Lumbar extension with resistive band in sitting x 20 reps Ankle DF/eversion with yellow band x 10 reps each   Modalities; Electrical stimulation: high volt muscle spasm clinical program: applied electrodes to bilateral knees; bilateral lower back with patient long sitting and moist heat applied to same x 20 min (no adverse effects noted following treatment)  Patient response to treatment: improved technique and alignment for all exercises with moderate cuing. Patient reported 50% decreased pain in knees, lower back following estim, moist heat treatment.  Patient education; HEP; re assessed home exercises, added lumbar extension  Patient demonstrated with good technique with verbal cues and demonstration      PT Long Term Goals - 04/24/16 1650      PT LONG TERM GOAL #1   Title Pt will score a 45/64 on the LEFS score by 06/05/16 to demonstrate significant improvement in lower extremity function and improved ability to ambulate without increased pain.   Baseline LEFS 20/64 (moderate-severe self perceived lower  extremity dysfunction)   Status New     PT LONG TERM GOAL #2   Title Pt will score a <25%  on the MODI by 06/05/16 to demonstrate significant improvement in low back function and improved ability to perform bending/lifting activities.    Baseline MODI: 35%(Moderate self perceived low back dysfunction)   Status New     PT LONG TERM GOAL #3   Title Pt will be independent with exercise performance and progresion of exercises focused on improving Lumbar and LE weakness and pain by 06/05/16 to allow for improvement of symptoms after discharge of therapy.   Baseline limited knowledge of appropriate  exercise performance and progression.    Status New               Plan - 04/29/16 1658    Clinical Impression Statement Patient was able to perform exercises with assistance and minimal cuing with soreness in both knees indicating weakness and inflammation. She responded favorably to treatment with decreased soreness and pain folloiwng estim. and moist heat. She will benefit from additional physical therapy to achieve goals.   Rehab Potential Good   PT Frequency 2x / week   PT Duration 6 weeks   PT Treatment/Interventions Moist Heat;Iontophoresis 4mg /ml Dexamethasone;Electrical Stimulation;Cryotherapy;Ultrasound;Therapeutic activities;Therapeutic exercise;Manual techniques;Patient/family education;Neuromuscular re-education   PT Next Visit Plan pain control, progressive exercises for stabilizaiton and LE strengthening, modalities for pain control   PT Home Exercise Plan seated hip adduction with ball, glute sets, hip abduction with resistive band, quad sets, ankle resistive band exercises DF/eversion      Patient will benefit from skilled therapeutic intervention in order to improve the following deficits and impairments:  Decreased strength, Impaired flexibility, Pain, Decreased activity tolerance, Difficulty walking, Impaired perceived functional ability  Visit Diagnosis: Pain in right knee  Pain in left knee  Bilateral low back pain without sciatica  Muscle weakness (generalized)     Problem List Patient Active Problem List   Diagnosis Date Noted  . Valvular heart disease   . Diverticulitis 05/25/2015  . Abdominal pain 05/24/2015  . Allergic rhinitis 04/30/2015  . Absolute anemia 04/30/2015  . Anxiety 04/30/2015  . Adult BMI 30+ 04/30/2015  . Carpal tunnel syndrome 04/30/2015  . Chronic LBP 04/30/2015  . Edema extremities 04/30/2015  . Cannot sleep 04/30/2015  . Arthritis sicca 04/30/2015  . Seasonal affective disorder (HCC) 04/30/2015  . Heart valve disease  04/30/2015  . Encounter for preventive health examination 06/27/2014  . Heart murmur 06/27/2013  . Essential hypertension 06/27/2013  . Obesity 06/27/2013  . Osteoarthritis 06/27/2013  . Hyperlipidemia 06/27/2013  . Coxitis 07/08/2012  . History of repair of hip joint 07/08/2012  . Cholelithiasis without obstruction 03/22/2007    Beacher May PT 04/29/2016, 11:16 PM  [facility] PHYSICAL AND SPORTS MEDICINE [address] [place], [address] Phone: [phone]   Fax:  [phone]  Name: Alicia Mccann MRN: [id number] Date of Birth: 04-12-1933

## 2016-05-01 ENCOUNTER — Encounter: Payer: Self-pay | Admitting: Physical Therapy

## 2016-05-01 ENCOUNTER — Ambulatory Visit: Payer: Medicare Other | Admitting: Physical Therapy

## 2016-05-01 DIAGNOSIS — M545 Low back pain, unspecified: Secondary | ICD-10-CM

## 2016-05-01 DIAGNOSIS — M25562 Pain in left knee: Secondary | ICD-10-CM

## 2016-05-01 DIAGNOSIS — M6281 Muscle weakness (generalized): Secondary | ICD-10-CM

## 2016-05-01 DIAGNOSIS — M25561 Pain in right knee: Secondary | ICD-10-CM

## 2016-05-01 NOTE — Therapy (Signed)
[facility] [facility] [address] [place], [place], [address] Phone: [phone]   Fax:  [phone]  Physical Therapy Treatment  Patient Details  Name: Alicia OlszewskiJacqueline L Kreitzer MRN: [id number] Date of Birth: Nov 11, 1932 Referring Provider: Joycelyn ManBurnette, Jennifer NP  Encounter Date: 05/01/2016      PT End of Session - 05/01/16 1724    Visit Number 3   Number of Visits 12   Date for PT Re-Evaluation 06/05/16   Authorization Type 3   Authorization Time Period 10 (G-codes)   PT Start Time 1625   PT Stop Time 1714   PT Time Calculation (min) 49 min   Activity Tolerance Patient tolerated treatment well   Behavior During Therapy [facility] for tasks assessed/performed      Past Medical History:  Diagnosis Date  . Allergic rhinitis   . Anemia   . Anxiety   . Cellulitis   . Cholelithiasis   . Essential hypertension   . History of stress test    a. 12/2008 Ex MV: EF 78%, no ischemia.  . Insomnia   . Osteoarthritis   . Tachycardia   . Valvular heart disease    a. 12/2008 Echo: EF 65%, mild MR;  b. 2/6 SEM RUSB - insignificant murmur, prob Ao Sclerosis.    Past Surgical History:  Procedure Laterality Date  . BACK SURGERY    . BUNIONECTOMY    . CHOLECYSTECTOMY  03/29/2007  . GALLBLADDER SURGERY    . HIP SURGERY     bilateral   . THUMB ARTHROSCOPY      There were no vitals filed for this visit.      Subjective Assessment - 05/01/16 1627    Subjective Back is sore, discomfort feeling. Overall she is noticing improvement in back and knee pain/symptoms.    Limitations Sitting;Standing;Walking;House hold activities   Patient Stated Goals To move better and improve dull ache in the back and knees.    Currently in Pain? Yes   Pain Score 2    Pain Location Back  both knees   Pain Orientation Right;Left;Lower   Pain Descriptors / Indicators Aching;Discomfort   Pain Type Chronic pain   Pain Onset More than a month ago   Pain  Frequency Constant           Objective; Gait: antalgic gait pattern, decreased trunk rotation    Treatment: Therapeutic exercise: patient performed exercises with guidance, verbal and tactile cues and demonstration of PT: Sitting; Hip adduction with ball and glute sets x 10 Hip abduction with red  resistive band x 20 LAQ 2# x 15 reps each LE, knee flexion red resistive band x 15 reps Lumbar extension with resistive band in sitting x 20 reps Seated rows x 15 reps Ankle DF/eversion with yellow band x 10 reps each  Manual therapy: STM along bilateral thoracic to lumbar spine, focus on right side with patient in seated position on treatment table; goal: pain, reduce spasms Modalities; Electrical stimulation: high volt muscle spasm clinical program: applied electrodes to bilateral knees; bilateral lower back with patient long sitting and moist heat applied to same x 20 min (no adverse effects noted following treatment)  Patient response to treatment: demonstrated good understanding of exercises with verbal cuing and demonstration, decreased pain and spasms to mild, 1-2/10 pain in back/knees at end of session   Patient education; Re assessed home program of exercises Patient verbalized good understanding of exercises        PT Long  Term Goals - 04/24/16 1650      PT LONG TERM GOAL #1   Title Pt will score a 45/64 on the LEFS score by 06/05/16 to demonstrate significant improvement in lower extremity function and improved ability to ambulate without increased pain.   Baseline LEFS 20/64 (moderate-severe self perceived lower extremity dysfunction)   Status New     PT LONG TERM GOAL #2   Title Pt will score a <25%  on the MODI by 06/05/16 to demonstrate significant improvement in low back function and improved ability to perform bending/lifting activities.    Baseline MODI: 35%(Moderate self perceived low back dysfunction)   Status New     PT LONG TERM GOAL #3   Title Pt  will be independent with exercise performance and progresion of exercises focused on improving Lumbar and LE weakness and pain by 06/05/16 to allow for improvement of symptoms after discharge of therapy.   Baseline limited knowledge of appropriate exercise performance and progression.    Status New               Plan - 05/01/16 1715    Clinical Impression Statement Patient demonstrated significant improvement in pain in knees and back with treatment. She is progressing with exercises and requries guidance and assistance to perform with correct posture, alignment and with correct technique. she continues with limited knowledge of appropriate progression of exercise and will therefore require additional physical therapy intervention to achieve goals.   Rehab Potential Good   PT Frequency 2x / week   PT Duration 6 weeks   PT Treatment/Interventions Moist Heat;Iontophoresis 4mg /ml Dexamethasone;Electrical Stimulation;Cryotherapy;Ultrasound;Therapeutic activities;Therapeutic exercise;Manual techniques;Patient/family education;Neuromuscular re-education   PT Next Visit Plan pain control, progressive exercises for stabilizaiton and LE strengthening, modalities for pain control   PT Home Exercise Plan seated hip adduction with ball, glute sets, hip abduction with resistive band, quad sets, ankle resistive band exercises DF/eversion      Patient will benefit from skilled therapeutic intervention in order to improve the following deficits and impairments:  Decreased strength, Impaired flexibility, Pain, Decreased activity tolerance, Difficulty walking, Impaired perceived functional ability  Visit Diagnosis: Pain in right knee  Pain in left knee  Bilateral low back pain without sciatica  Muscle weakness (generalized)     Problem List Patient Active Problem List   Diagnosis Date Noted  . Valvular heart disease   . Diverticulitis 05/25/2015  . Abdominal pain 05/24/2015  . Allergic  rhinitis 04/30/2015  . Absolute anemia 04/30/2015  . Anxiety 04/30/2015  . Adult BMI 30+ 04/30/2015  . Carpal tunnel syndrome 04/30/2015  . Chronic LBP 04/30/2015  . Edema extremities 04/30/2015  . Cannot sleep 04/30/2015  . Arthritis sicca 04/30/2015  . Seasonal affective disorder (HCC) 04/30/2015  . Heart valve disease 04/30/2015  . Encounter for preventive health examination 06/27/2014  . Heart murmur 06/27/2013  . Essential hypertension 06/27/2013  . Obesity 06/27/2013  . Osteoarthritis 06/27/2013  . Hyperlipidemia 06/27/2013  . Coxitis 07/08/2012  . History of repair of hip joint 07/08/2012  . Cholelithiasis without obstruction 03/22/2007    Beacher May PT 05/02/2016, 6:29 PM  [place] [facility] PHYSICAL AND SPORTS MEDICINE [address], [place], [address] Phone: [phone]   Fax:  [phone]  Name: [place] GAMERO MRN: [id number] Date of Birth: [birth year]

## 2016-05-07 ENCOUNTER — Ambulatory Visit: Payer: Medicare Other | Admitting: Physical Therapy

## 2016-05-13 ENCOUNTER — Ambulatory Visit: Payer: Medicare Other | Admitting: Physical Therapy

## 2016-05-13 ENCOUNTER — Encounter: Payer: Self-pay | Admitting: Physical Therapy

## 2016-05-13 DIAGNOSIS — M25561 Pain in right knee: Secondary | ICD-10-CM | POA: Diagnosis not present

## 2016-05-13 DIAGNOSIS — M6281 Muscle weakness (generalized): Secondary | ICD-10-CM

## 2016-05-13 DIAGNOSIS — M545 Low back pain, unspecified: Secondary | ICD-10-CM

## 2016-05-13 DIAGNOSIS — M25562 Pain in left knee: Secondary | ICD-10-CM

## 2016-05-14 NOTE — Therapy (Signed)
[facility]ICINE [address] [place], [place], [address] Phone: [phone]   Fax:  [phone]  Physical Therapy Treatment  Patient Details  Name: Alicia OlszewskiJacqueline L Fitzmaurice MRN: [id number] Date of Birth: [birth year] Referring Provider: Joycelyn ManBurnette, Jennifer NP  Encounter Date: 05/13/2016      PT End of Session - 05/13/16 1135    Visit Number 4   Number of Visits 12   Date for PT Re-Evaluation 06/05/16   Authorization Type 4   Authorization Time Period 10 (G-codes)   PT Start Time 1045   PT Stop Time 1125   PT Time Calculation (min) 40 min   Activity Tolerance Patient tolerated treatment well   Behavior [facility] assessed/performed      Past Medical History:  Diagnosis Date  . Allergic rhinitis   . Anemia   . Anxiety   . Cellulitis   . Cholelithiasis   . Essential hypertension   . History of stress test    a. 12/2008 Ex MV: EF 78%, no ischemia.  . Insomnia   . Osteoarthritis   . Tachycardia   . Valvular heart disease    a. 12/2008 Echo: EF 65%, mild MR;  b. 2/6 SEM RUSB - insignificant murmur, prob Ao Sclerosis.    Past Surgical History:  Procedure Laterality Date  . BACK SURGERY    . BUNIONECTOMY    . CHOLECYSTECTOMY  03/29/2007  . GALLBLADDER SURGERY    . HIP SURGERY     bilateral   . THUMB ARTHROSCOPY      There were no vitals filed for this visit.      Subjective Assessment - 05/13/16 1130    Subjective Patient reports pain in back and both knees with stiffness. She has been cleanign house and this may be contributing to her pain/sorness today.   Limitations Sitting;Standing;Walking;House hold activities   Patient Stated Goals To move better and improve dull ache in the back and knees.    Currently in Pain? Yes   Pain Score 5    Pain Location Back  both knees   Pain Orientation Right;Left;Lower   Pain Descriptors / Indicators Aching;Tightness   Pain Type Chronic pain   Pain  Onset More than a month ago   Pain Frequency Constant       Objective; Gait: antalgic gait pattern, decreased trunk rotation   Treatment: Therapeutic exercise: patient performed exercises with guidance, verbal and tactile cues and demonstration of PT: Sitting; Hip adduction with ball and glute sets x 10 Hip abduction with red  resistive band x 20 LAQ 2# x 15 reps each LE, knee flexion red resistive band x 15 reps Rhythmic stabilization flexion/extension and rotations x 10 reps each in sitting Seated rows x 15 reps Straight arm pull downs with resistive band x 15 reps Ankle DF/eversion with yellow band x 10 reps each (re instructed)  Manual therapy: STM along bilateral thoracic to lumbar spine, focus on right side with patient in seated position on treatment table; goal: pain, reduce spasms Modalities; Electrical stimulation: high volt muscle spasm clinical program: applied electrodes to bilateral knees; bilateral lower back with patient long sitting and moist heat applied to same x 20 min (no adverse effects noted following treatment)  Patient response to treatment: Patient demonstrated improved pain level to minimal and good technique with exercises with minimal VC.           PT Education - 05/13/16 1139  Education provided Yes   Education Details HEP: LE and back exercises re assessed   Person(s) Educated Patient   Methods Explanation;Demonstration;Verbal cues   Comprehension Verbalized understanding;Returned demonstration;Verbal cues required             PT Long Term Goals - 04/24/16 1650      PT LONG TERM GOAL #1   Title Pt will score a 45/64 on the LEFS score by 06/05/16 to demonstrate significant improvement in lower extremity function and improved ability to ambulate without increased pain.   Baseline LEFS 20/64 (moderate-severe self perceived lower extremity dysfunction)   Status New     PT LONG TERM GOAL #2   Title Pt will score a <25%  on the  MODI by 06/05/16 to demonstrate significant improvement in low back function and improved ability to perform bending/lifting activities.    Baseline MODI: 35%(Moderate self perceived low back dysfunction)   Status New     PT LONG TERM GOAL #3   Title Pt will be independent with exercise performance and progresion of exercises focused on improving Lumbar and LE weakness and pain by 06/05/16 to allow for improvement of symptoms after discharge of therapy.   Baseline limited knowledge of appropriate exercise performance and progression.    Status New               Plan - 05/13/16 1140    Clinical Impression Statement Patient demonstrated decreased pain by 50% and decreased spasms in back with improved ability to walk with less difficulty following treatment. She continues with weakness in core and LE's and requires gudiance and instruction to perform exercises with good technique and will therefore require additional physical therapy intervention to achieve goals    Rehab Potential Good   PT Frequency 2x / week   PT Duration 6 weeks   PT Treatment/Interventions Moist Heat;Iontophoresis 4mg /ml Dexamethasone;Electrical Stimulation;Cryotherapy;Ultrasound;Therapeutic activities;Therapeutic exercise;Manual techniques;Patient/family education;Neuromuscular re-education   PT Next Visit Plan pain control, progressive exercises for stabilizaiton and LE strengthening, modalities for pain control   PT Home Exercise Plan seated hip adduction with ball, glute sets, hip abduction with resistive band, quad sets, ankle resistive band exercises DF/eversion      Patient will benefit from skilled therapeutic intervention in order to improve the following deficits and impairments:  Decreased strength, Impaired flexibility, Pain, Decreased activity tolerance, Difficulty walking, Impaired perceived functional ability  Visit Diagnosis: Pain in right knee  Pain in left knee  Bilateral low back pain without  sciatica  Muscle weakness (generalized)     Problem List Patient Active Problem List   Diagnosis Date Noted  . Valvular heart disease   . Diverticulitis 05/25/2015  . Abdominal pain 05/24/2015  . Allergic rhinitis 04/30/2015  . Absolute anemia 04/30/2015  . Anxiety 04/30/2015  . Adult BMI 30+ 04/30/2015  . Carpal tunnel syndrome 04/30/2015  . Chronic LBP 04/30/2015  . Edema extremities 04/30/2015  . Cannot sleep 04/30/2015  . Arthritis sicca 04/30/2015  . Seasonal affective disorder (HCC) 04/30/2015  . Heart valve disease 04/30/2015  . Encounter for preventive health examination 06/27/2014  . Heart murmur 06/27/2013  . Essential hypertension 06/27/2013  . Obesity 06/27/2013  . Osteoarthritis 06/27/2013  . Hyperlipidemia 06/27/2013  . Coxitis 07/08/2012  . History of repair of hip joint 07/08/2012  . Cholelithiasis without obstruction 03/22/2007    Beacher May PT 05/14/2016, 2:53 PM  [facility] PHYSICAL AND SPORTS MEDICINE [address], [place], [address] Phone: [phone]  Fax:  [phone]  Name: CLYDEAN POSAS MRN: [id number] Date of Birth: [birth year]

## 2016-05-16 ENCOUNTER — Ambulatory Visit: Payer: Medicare Other | Admitting: Physical Therapy

## 2016-05-16 ENCOUNTER — Encounter: Payer: Self-pay | Admitting: Physical Therapy

## 2016-05-16 DIAGNOSIS — M25561 Pain in right knee: Secondary | ICD-10-CM | POA: Diagnosis not present

## 2016-05-16 DIAGNOSIS — M25562 Pain in left knee: Secondary | ICD-10-CM

## 2016-05-16 DIAGNOSIS — M545 Low back pain, unspecified: Secondary | ICD-10-CM

## 2016-05-16 DIAGNOSIS — M6281 Muscle weakness (generalized): Secondary | ICD-10-CM

## 2016-05-16 NOTE — Therapy (Signed)
[facility] [address], [place], [address] Phone: [phone]   Fax:  [phone]  Physical Therapy Treatment  Patient Details  Name: Alicia Mccann MRN: [id number] Date of Birth: [birth year] Referring Provider: Joycelyn Man NP  Encounter Date: 05/16/2016      PT End of Session - 05/16/16 1012    Visit Number 5   Number of Visits 12   Date for PT Re-Evaluation 06/05/16   Authorization Type 5   Authorization Time Period 10 (G-codes)   PT Start Time 1006   PT Stop Time 1050   PT Time Calculation (min) 44 min   Activity Tolerance Patient tolerated treatment well   Behavior During Therapy [facility] for tasks assessed/performed      Past Medical History:  Diagnosis Date  . Allergic rhinitis   . Anemia   . Anxiety   . Cellulitis   . Cholelithiasis   . Essential hypertension   . History of stress test    a. 12/2008 Ex MV: EF 78%, no ischemia.  . Insomnia   . Osteoarthritis   . Tachycardia   . Valvular heart disease    a. 12/2008 Echo: EF 65%, mild MR;  b. 2/6 SEM RUSB - insignificant murmur, prob Ao Sclerosis.    Past Surgical History:  Procedure Laterality Date  . BACK SURGERY    . BUNIONECTOMY    . CHOLECYSTECTOMY  03/29/2007  . GALLBLADDER SURGERY    . HIP SURGERY     bilateral   . THUMB ARTHROSCOPY      There were no vitals filed for this visit.      Subjective Assessment - 05/16/16 1205    Subjective patient reports her left knee is doing better today and right knee is hurting. She felt much better following previous treatment with STM to lower back.    Limitations Sitting;Standing;Walking;House hold activities   Patient Stated Goals To move better and improve dull ache in the back and knees.    Currently in Pain? Yes   Pain Score 4    Pain Location Back   Pain Descriptors / Indicators Aching   Pain Type Chronic pain   Pain Onset More than a month ago   Pain Frequency  Constant     Objective:  Treatment: Therapeutic exercise: patient performed exercises with guidance, verbal and tactile cues and demonstration of PT: Sitting; Hip adduction with ball and glute sets x 10 Hip abduction with manual resistance, moderate intensity, x 10 reps with 5 second holds end range LAQ 2# x 15 reps each LE, knee flexion red resistive band x 25 reps Seated rows x 15 reps with red resistive band Straight arm pull downs with resistive band x 15 reps  Manual therapy: STM along bilateral thoracic to lumbar spine, focus on right side with patient in seated position on treatment table; goal: pain, reduce spasms Modalities; Electrical stimulation: high volt muscle spasm clinical program: applied electrodes to bilateral knees; bilateral lower back with patient long sitting and moist heat applied to same x 15 min (no adverse effects noted following treatment)  Patient response to treatment: patient with decreased pain in back, decreased spasms 50% with STM/modalities, able to walk with decreased knee pain to mild/none and demonstrated good technique with VC for correct position/alignment of LE's/trunk for all exercises.   Patient education;  Re assessed home exercises and discussed joint protection to decrease inflammation of joints by resting between bouts of activity (ie  cleaning at home) Patient verbalized understanding       PT Long Term Goals - 04/24/16 1650      PT LONG TERM GOAL #1   Title Pt will score a 45/64 on the LEFS score by 06/05/16 to demonstrate significant improvement in lower extremity function and improved ability to ambulate without increased pain.   Baseline LEFS 20/64 (moderate-severe self perceived lower extremity dysfunction)   Status New     PT LONG TERM GOAL #2   Title Pt will score a <25%  on the MODI by 06/05/16 to demonstrate significant improvement in low back function and improved ability to perform bending/lifting activities.     Baseline MODI: 35%(Moderate self perceived low back dysfunction)   Status New     PT LONG TERM GOAL #3   Title Pt will be independent with exercise performance and progresion of exercises focused on improving Lumbar and LE weakness and pain by 06/05/16 to allow for improvement of symptoms after discharge of therapy.   Baseline limited knowledge of appropriate exercise performance and progression.    Status New               Plan - 05/16/16 1152    Clinical Impression Statement Patient demonstrated good technique with exercises with minimal cuing. She is progressing well towards all goals with decreasing pain, improving ability to walk with less difficulty. She continues with weakness and pain in right knee and lower back and will benefit from continued physical therapy to improve function with minimal difficulty.    Rehab Potential Good   PT Frequency 2x / week   PT Duration 6 weeks   PT Treatment/Interventions Moist Heat;Iontophoresis 4mg /ml Dexamethasone;Electrical Stimulation;Cryotherapy;Ultrasound;Therapeutic activities;Therapeutic exercise;Manual techniques;Patient/family education;Neuromuscular re-education   PT Next Visit Plan pain control, progressive exercises for stabilizaiton and LE strengthening, modalities for pain control   PT Home Exercise Plan seated hip adduction with ball, glute sets, hip abduction with resistive band, quad sets, ankle resistive band exercises DF/eversion      Patient will benefit from skilled therapeutic intervention in order to improve the following deficits and impairments:  Decreased strength, Impaired flexibility, Pain, Decreased activity tolerance, Difficulty walking, Impaired perceived functional ability  Visit Diagnosis: Pain in right knee  Pain in left knee  Bilateral low back pain without sciatica  Muscle weakness (generalized)     Problem List Patient Active Problem List   Diagnosis Date Noted  . Valvular heart disease   .  Diverticulitis 05/25/2015  . Abdominal pain 05/24/2015  . Allergic rhinitis 04/30/2015  . Absolute anemia 04/30/2015  . Anxiety 04/30/2015  . Adult BMI 30+ 04/30/2015  . Carpal tunnel syndrome 04/30/2015  . Chronic LBP 04/30/2015  . Edema extremities 04/30/2015  . Cannot sleep 04/30/2015  . Arthritis sicca 04/30/2015  . Seasonal affective disorder (HCC) 04/30/2015  . Heart valve disease 04/30/2015  . Encounter for preventive health examination 06/27/2014  . Heart murmur 06/27/2013  . Essential hypertension 06/27/2013  . Obesity 06/27/2013  . Osteoarthritis 06/27/2013  . Hyperlipidemia 06/27/2013  . Coxitis 07/08/2012  . History of repair of hip joint 07/08/2012  . Cholelithiasis without obstruction 03/22/2007    Beacher MayBrooks, Jacqui Headen PT 05/16/2016, 12:06 PM  [place] [facility] [address] [place], [place], [address] Phone: [phone]   Fax:  [phone]  Name: Alicia Mccann MRN: [id number] Date of Birth: 10-24-1932

## 2016-05-19 ENCOUNTER — Ambulatory Visit (INDEPENDENT_AMBULATORY_CARE_PROVIDER_SITE_OTHER): Payer: Medicare Other | Admitting: Physician Assistant

## 2016-05-19 ENCOUNTER — Encounter: Payer: Self-pay | Admitting: Physician Assistant

## 2016-05-19 VITALS — BP 138/70 | HR 69 | Temp 98.1°F | Resp 16 | Wt 172.2 lb

## 2016-05-19 DIAGNOSIS — E7800 Pure hypercholesterolemia, unspecified: Secondary | ICD-10-CM | POA: Diagnosis not present

## 2016-05-19 DIAGNOSIS — F338 Other recurrent depressive disorders: Secondary | ICD-10-CM

## 2016-05-19 DIAGNOSIS — F339 Major depressive disorder, recurrent, unspecified: Secondary | ICD-10-CM | POA: Diagnosis not present

## 2016-05-19 DIAGNOSIS — I739 Peripheral vascular disease, unspecified: Secondary | ICD-10-CM | POA: Diagnosis not present

## 2016-05-19 NOTE — Patient Instructions (Signed)
Call for lab slip and flu vaccine once things have settled down at home.   Intermittent Claudication Intermittent claudication is pain in your leg that occurs when you walk or exercise and goes away when you rest. The pain can occur in one or both legs. CAUSES Intermittent claudication is caused by the buildup of plaque within the major arteries in the body (atherosclerosis). The plaque, which makes arteries stiff and narrow, prevents enough blood from reaching your leg muscles. The pain occurs when you walk or exercise because your muscles need more blood when you are moving and exercising. RISK FACTORS Risk factors include:  A family history of atherosclerosis.  A personal history of stroke or heart disease.  Older age.  Being inactive or overweight.  Smoking cigarettes.  Having another health condition such as:  Diabetes.  High blood pressure.  High cholesterol. SIGNS AND SYMPTOMS  Your hip or leg may:   Ache.  Cramp.  Feel tight.  Feel weak.  Feel heavy. Over time, you may feel pain in your calf, thigh, or hip. DIAGNOSIS  Your health care provider may diagnose intermittent claudication based on your symptoms and medical history. Your health care provider may also do tests to learn more about your condition. These may include:  Blood tests.  An ultrasound.  Imaging tests such as angiography, magnetic resonance angiography (MRA), and computed tomography angiography (CTA). TREATMENT You may be treated for problems such as:  High blood pressure.  High cholesterol.  Diabetes. Other treatments may include:  Lifestyle changes such as:  Starting an exercise program.  Losing weight.  Quitting smoking.  Medicines to help restore blood flow through your legs.  Blood vessel surgery (angioplasty) to restore blood flow if your intermittent claudication is caused by severe peripheral artery disease. HOME CARE INSTRUCTIONS  Manage any other health conditions  you have.  Eat a diet low in saturated fats and calories to maintain a healthy weight.  Quit smoking, if you smoke.  Take medicines only as directed by your health care provider.  If your health care provider recommended an exercise program for you, follow it as directed. Your exercise program may involve:  Walking three or more times a week.  Walking until you have certain symptoms of intermittent claudication.  Resting until symptoms go away.  Gradually increasing walking time to about 50 minutes a day. SEEK MEDICAL CARE IF: Your condition is not getting better or is getting worse. SEEK IMMEDIATE MEDICAL CARE IF:   You have chest pain.  You have difficulty breathing.  You develop arm weakness.  You have trouble speaking.  Your face begins to droop. MAKE SURE YOU:  Understand these instructions.  Will watch your condition.  Will get help if you are not doing well or get worse.   This information is not intended to replace advice given to you by your health care provider. Make sure you discuss any questions you have with your health care provider.   Document Released: 06/06/2004 Document Revised: 08/25/2014 Document Reviewed: 11/10/2013 Elsevier Interactive Patient Education 2016 Else[employer] Inc[employer]  Cholesterol Cholesterol is a white, waxy, fat-like substance needed by your body in small amounts. The liver makes all the cholesterol you need. Cholesterol is carried from the liver by the blood through the blood vessels. Deposits of cholesterol (plaque) may build up on blood vessel walls. These make the arteries narrower and stiffer. Cholesterol plaques increase the risk for heart attack and stroke.  You cannot feel your cholesterol level even if  it is very high. The only way to know it is high is with a blood test. Once you know your cholesterol levels, you should keep a record of the test results. Work with your health care provider to keep your levels in the desired range.    WHAT DO THE RESULTS MEAN?  Total cholesterol is a rough measure of all the cholesterol in your blood.   LDL is the so-called bad cholesterol. This is the type that deposits cholesterol in the walls of the arteries. You want this level to be low.   HDL is the good cholesterol because it cleans the arteries and carries the LDL away. You want this level to be high.  Triglycerides are fat that the body can either burn for energy or store. High levels are closely linked to heart disease.  WHAT ARE THE DESIRED LEVELS OF CHOLESTEROL?  Total cholesterol below 200.   LDL below 100 for people at risk, below 70 for those at very high risk.   HDL above 50 is good, above 60 is best.   Triglycerides below 150.  HOW CAN I LOWER MY CHOLESTEROL?  Diet. Follow your diet programs as directed by your health care provider.   Choose fish or white meat chicken and [place], roasted or baked. Limit fatty cuts of red meat, fried foods, and processed meats, such as sausage and lunch meats.   Eat lots of fresh fruits and vegetables.  Choose whole grains, beans, pasta, potatoes, and cereals.   Use only small amounts of olive, corn, or canola oils.   Avoid butter, mayonnaise, shortening, or palm kernel oils.  Avoid foods with trans fats.   Drink skim or nonfat milk and eat low-fat or nonfat yogurt and cheeses. Avoid whole milk, cream, ice cream, egg yolks, and full-fat cheeses.   Healthy desserts include angel food cake, ginger snaps, animal crackers, hard candy, popsicles, and low-fat or nonfat frozen yogurt. Avoid pastries, cakes, pies, and cookies.   Exercise. Follow your exercise programs as directed by your health care provider.   A regular program helps decrease LDL and raise HDL.   A regular program helps with weight control.   Do things that increase your activity level like gardening, walking, or taking the stairs. Ask your health care provider about how you can be more  active in your daily life.   Medicine. Take medicine only as directed by your health care provider.   Medicine may be prescribed by your health care provider to help lower cholesterol and decrease the risk for heart disease.   If you have several risk factors, you may need medicine even if your levels are normal.   This information is not intended to replace advice given to you by your health care provider. Make sure you discuss any questions you have with your health care provider.   Document Released: 04/29/2001 Document Revised: 08/25/2014 Document Reviewed: 05/18/2013 Elsevier Interactive Patient Education [employer]

## 2016-05-19 NOTE — Progress Notes (Signed)
[name] Female    DOB: 1933/03/28   82 y.o.   MRN: [id number] Visit Date: 05/19/2016  Today's Provider: Margaretann Loveless, PA-C   Chief Complaint  Patient presents with  . [employer] Form  . Follow-up    Hypertension, Hyperlipidemia,Seaseonal affective disorder   Subjective:    HPI Patient is here today to follow-up on Cholesterol,Seasonal affective disorder and the optum form to be fill out.   Lipid/Cholesterol, Follow-up:   Last seen for this 10 months ago.  Management changes since that visit include none. Stable. . Last Lipid Panel:    Component Value Date/Time   CHOL 200 (H) 06/19/2015 0851   TRIG 176 (H) 06/19/2015 0851   HDL 48 06/19/2015 0851   LDLCALC 117 (H) 06/19/2015 0851    Risk factors for vascular disease include hypercholesterolemia and hypertension  She reports excellent compliance with treatment. She is not having side effects.  Current symptoms include none and have been stable. Current diet: in general, a "healthy" diet     Wt Readings from Last 3 Encounters:  05/19/16 172 lb 3.2 oz (78.1 kg)  12/06/15 172 lb (78 kg)  09/19/15 170 lb 1.9 oz (77.2 kg)    -------------------------------------------------------------------  Hypertension, follow-up:  BP Readings from Last 3 Encounters:  05/19/16 138/70  12/06/15 126/60  09/19/15 (!) 166/79    She was last seen for hypertension 5 months ago.  BP at that visit was 126/60. Management since that visit includes none. She reports excellent compliance with treatment. She is not having side effects.  She is adherent to low salt diet.   Outside blood pressures are n/a She is experiencing none. She walks everyday but she has a bad knee (right) that acts up sometimes. Patient denies chest pain, chest pressure/discomfort, exertional chest pressure/discomfort, fatigue, irregular heart beat, lower extremity edema and palpitations.   Cardiovascular risk factors include advanced  age (older than 36 for men, 67 for women), dyslipidemia and hypertension.      Weight trend: stable Wt Readings from Last 3 Encounters:  05/19/16 172 lb 3.2 oz (78.1 kg)  12/06/15 172 lb (78 kg)  09/19/15 170 lb 1.9 oz (77.2 kg)    Current diet: in general, a "healthy" diet    ------------------------------------------------------------------------ Seasonal affective disorder: Patient is stable. Anxiety stable. Xanax prn.  She reports that she does want her Influenza vaccine she just don't want them today. According to our record patient had her PPSV23 06/18/2004 and Prevnar13 06/09/2014. She had a family member pass away this Sunday and is hosting family so she does not want to have a sore arm before they arrive.     Allergies  Allergen Reactions  . Albumen, Egg   . Eggs Or Egg-Derived Products   . Levaquin [Levofloxacin]   . Milk-Related Compounds   . Penicillins   . Shellfish Allergy   . Sulfa Antibiotics   . Wheat Bran      Current Outpatient Prescriptions:  .  Acetaminophen (EXTRA STRENGTH ACETAMINOPHEN) 500 MG coapsule, Take 500 mg by mouth as needed for fever., Disp: , Rfl:  .  amLODipine (NORVASC) 2.5 MG tablet, Take 1 tablet (2.5 mg total) by mouth daily., Disp: 90 tablet, Rfl: 3 .  aspirin 81 MG tablet, Take 81 mg by mouth daily., Disp: , Rfl:  .  cholecalciferol (VITAMIN D) 1000 UNITS tablet, Take 2,000 Units by mouth every other day. , Disp: , Rfl:  .  Cyanocobalamin (VITAMIN B  12 PO), Take 1,000 mg by mouth every other day. , Disp: , Rfl:  .  meloxicam (MOBIC) 7.5 MG tablet, TAKE ONE TABLET DAILT AS NEEDED FOR PAIN, Disp: 60 tablet, Rfl: 11 .  triamterene-hydrochlorothiazide (MAXZIDE) 75-50 MG tablet, Take 1 tablet by mouth daily., Disp: 90 tablet, Rfl: 3 .  ALPRAZolam (XANAX) 0.25 MG tablet, Take 1 tablet (0.25 mg total) by mouth at bedtime as needed. (Patient not taking: Reported on 05/19/2016), Disp: 30 tablet, Rfl: 5  Review of Systems  Constitutional:  Negative.   Respiratory: Negative.   Cardiovascular: Negative.   Gastrointestinal: Negative.   Neurological: Negative.   Psychiatric/Behavioral: Negative.     Social History  Substance Use Topics  . Smoking status: Never Smoker  . Smokeless tobacco: Never Used  . Alcohol use No   Objective:   BP 138/70 (BP Location: Right Arm, Patient Position: Sitting, Cuff Size: Normal)   Pulse 69   Temp 98.1 F (36.7 C) (Oral)   Resp 16   Wt 172 lb 3.2 oz (78.1 kg)   BMI 32.54 kg/m   Physical Exam  Constitutional: She appears well-developed and well-nourished. No distress.  Neck: Normal range of motion. Neck supple. No tracheal deviation present. No thyromegaly present.  Cardiovascular: Normal rate, regular rhythm and normal heart sounds.  Exam reveals no gallop and no friction rub.   No murmur heard. Pulses:      Dorsalis pedis pulses are 2+ on the right side, and 2+ on the left side.       Posterior tibial pulses are 1+ on the right side, and 2+ on the left side.  Pulmonary/Chest: Effort normal and breath sounds normal. No respiratory distress. She has no wheezes. She has no rales.  Musculoskeletal: She exhibits no edema.  Lymphadenopathy:    She has no cervical adenopathy.  Skin: She is not diaphoretic.  Psychiatric: She has a normal mood and affect. Her behavior is normal. Judgment and thought content normal.  Vitals reviewed.     Assessment & Plan:     1. Seasonal affective disorder (HCC) Stable. Patient only uses xanax prn. States she has no issues until mid-Winter mostly.  2. Pure hypercholesterolemia Stable. Patient had eaten today and has a lot of things going on with her family this week. She is going to call next week sometime for her lab slip. Labs are not technically due until Nov. She will also get her flu shot when she comes for her lab slip.  3. PAD (peripheral artery disease) (HCC) Screening completed through [facility] revealed an ABI 0.70 on R LE and 0.95  on L LE. Patient denies any intermittent claudication. Feet are warm and pulses palpable. No rest pain. Advised of symptoms of claudication and for her to call if any of them develop. If not we will recheck ABI in 1 year.       Margaretann LovelessJennifer M Anquan Azzarello, PA-C  [facility]

## 2016-05-20 ENCOUNTER — Ambulatory Visit: Payer: Medicare Other | Attending: Physician Assistant | Admitting: Physical Therapy

## 2016-05-20 ENCOUNTER — Encounter: Payer: Self-pay | Admitting: Physical Therapy

## 2016-05-20 DIAGNOSIS — M545 Low back pain: Secondary | ICD-10-CM | POA: Insufficient documentation

## 2016-05-20 DIAGNOSIS — M25562 Pain in left knee: Secondary | ICD-10-CM | POA: Diagnosis present

## 2016-05-20 DIAGNOSIS — G8929 Other chronic pain: Secondary | ICD-10-CM | POA: Insufficient documentation

## 2016-05-20 DIAGNOSIS — M25561 Pain in right knee: Secondary | ICD-10-CM | POA: Diagnosis present

## 2016-05-20 DIAGNOSIS — M6281 Muscle weakness (generalized): Secondary | ICD-10-CM | POA: Diagnosis present

## 2016-05-20 NOTE — Therapy (Signed)
[facility] PHYSICAL AND SPORTS MEDICINE [address] [place], [place], [address] Phone: [phone]   Fax:  [phone]  Physical Therapy Treatment  Patient Details  Name: Alicia OlszewskiJacqueline L Gravelle MRN: [id number] Date of Birth: 01-29-1933 Referring Provider: Joycelyn ManBurnette, Jennifer NP  Encounter Date: 05/20/2016      PT End of Session - 05/20/16 1000    Visit Number 6   Number of Visits 12   Date for PT Re-Evaluation 06/05/16   Authorization Type 6   Authorization Time Period 10 (G-codes)   PT Start Time 0901   PT Stop Time 0950   PT Time Calculation (min) 49 min   Activity Tolerance Patient tolerated treatment well   Behavior [facility] for tasks assessed/performed      Past Medical History:  Diagnosis Date  . Allergic rhinitis   . Anemia   . Anxiety   . Cellulitis   . Cholelithiasis   . Essential hypertension   . History of stress test    a. 12/2008 Ex MV: EF 78%, no ischemia.  . Insomnia   . Osteoarthritis   . Tachycardia   . Valvular heart disease    a. 12/2008 Echo: EF 65%, mild MR;  b. 2/6 SEM RUSB - insignificant murmur, prob Ao Sclerosis.    Past Surgical History:  Procedure Laterality Date  . BACK SURGERY    . BUNIONECTOMY    . CHOLECYSTECTOMY  03/29/2007  . GALLBLADDER SURGERY    . HIP SURGERY     bilateral   . THUMB ARTHROSCOPY      There were no vitals filed for this visit.      Subjective Assessment - 05/20/16 0903    Subjective Patient reports she has cleaned house because she has company coming in later this week and now her right knee is hurting ~ same.    Limitations Sitting;Standing;Walking;House hold activities   Patient Stated Goals To move better and improve dull ache in the back and knees.    Currently in Pain? Yes   Pain Score 5    Pain Orientation Right;Left;Lower   Pain Descriptors / Indicators Aching   Pain Type Chronic pain   Pain Onset More than a month ago        Objective: Gait:  slow cadence, antalgic gait pattern, decreased trunk rotation  Treatment: Therapeutic exercise: patient performed exercises with guidance, verbal and tactile cues and demonstration of PT: Sitting; Hip adduction with ball and glute sets x 10 Hip abduction with manual resistance, moderate intensity, x 10 reps with 5 second holds end range LAQ 2# x 15 reps each LE, knee flexion red resistive band x 25 reps Seated rows x 15 reps with red resistive band Straight arm pull downs with resistive band x 15 reps  Manual therapy: STM along bilateral thoracic to lumbar spine, focus on right side with patient in seated position on treatment table; goal: pain, reduce spasms Modalities; Electrical stimulation: high volt muscle spasm clinical program: applied electrodes to bilateral knees; bilateral lower back with patient long sitting and moist heat applied to same x 15 min (no adverse effects noted following treatment)  Patient response to treatment: Patient demonstrated improved gait pattern following treatment. Decreased spasms noted along right side of back demonstrating good carry over between sessions. Patient able to walk with mild to no right knee pain at end of session following estim/moist heat, no worse with exercises.        PT Education - 05/20/16  1610    Education provided Yes   Education Details HEP: re assessed exercises to continue at home 3-4x/week   Person(s) Educated Patient   Methods Explanation;Demonstration;Verbal cues   Comprehension Verbalized understanding;Returned demonstration;Verbal cues required             PT Long Term Goals - 04/24/16 1650      PT LONG TERM GOAL #1   Title Pt will score a 45/64 on the LEFS score by 06/05/16 to demonstrate significant improvement in lower extremity function and improved ability to ambulate without increased pain.   Baseline LEFS 20/64 (moderate-severe self perceived lower extremity dysfunction)   Status New     PT LONG  TERM GOAL #2   Title Pt will score a <25%  on the MODI by 06/05/16 to demonstrate significant improvement in low back function and improved ability to perform bending/lifting activities.    Baseline MODI: 35%(Moderate self perceived low back dysfunction)   Status New     PT LONG TERM GOAL #3   Title Pt will be independent with exercise performance and progresion of exercises focused on improving Lumbar and LE weakness and pain by 06/05/16 to allow for improvement of symptoms after discharge of therapy.   Baseline limited knowledge of appropriate exercise performance and progression.    Status New               Plan - 05/20/16 0940    Clinical Impression Statement Patient with decreased pain with current treatment for knees and back. Decreased spasms noted along right side of back demonstrating good carry over between sessions.    Rehab Potential Good   PT Frequency 2x / week   PT Duration 6 weeks   PT Treatment/Interventions Moist Heat;Iontophoresis 4mg /ml Dexamethasone;Electrical Stimulation;Cryotherapy;Ultrasound;Therapeutic activities;Therapeutic exercise;Manual techniques;Patient/family education;Neuromuscular re-education   PT Next Visit Plan pain control, progressive exercises for stabilizaiton and LE strengthening, modalities for pain control   PT Home Exercise Plan seated hip adduction with ball, glute sets, hip abduction with resistive band, quad sets, ankle resistive band exercises DF/eversion      Patient will benefit from skilled therapeutic intervention in order to improve the following deficits and impairments:  Decreased strength, Impaired flexibility, Pain, Decreased activity tolerance, Difficulty walking, Impaired perceived functional ability  Visit Diagnosis: Chronic pain of right knee  Muscle weakness (generalized)  Chronic pain of left knee  Chronic bilateral low back pain without sciatica     Problem List Patient Active Problem List   Diagnosis Date  Noted  . PAD (peripheral artery disease) (HCC) 05/19/2016  . Valvular heart disease   . Diverticulitis 05/25/2015  . Abdominal pain 05/24/2015  . Allergic rhinitis 04/30/2015  . Absolute anemia 04/30/2015  . Anxiety 04/30/2015  . Adult BMI 30+ 04/30/2015  . Carpal tunnel syndrome 04/30/2015  . Chronic LBP 04/30/2015  . Edema extremities 04/30/2015  . Cannot sleep 04/30/2015  . Arthritis sicca 04/30/2015  . Seasonal affective disorder (HCC) 04/30/2015  . Heart murmur 06/27/2013  . Essential hypertension 06/27/2013  . Obesity 06/27/2013  . Osteoarthritis 06/27/2013  . Hyperlipidemia 06/27/2013  . Coxitis 07/08/2012  . History of repair of hip joint 07/08/2012  . Cholelithiasis without obstruction 03/22/2007    Beacher May PT 05/20/2016, 4:12 PM  [place] [facility] PHYSICAL AND SPORTS MEDICINE [address], [place], [address] Phone: [phone]   Fax:  [phone]  Name: DESHARA ROSSI MRN: [id number] Date of Birth: 1933-03-03

## 2016-05-22 ENCOUNTER — Encounter: Payer: Medicare Other | Admitting: Physical Therapy

## 2016-05-27 ENCOUNTER — Encounter: Payer: Self-pay | Admitting: Physical Therapy

## 2016-05-27 ENCOUNTER — Ambulatory Visit: Payer: Medicare Other | Admitting: Physical Therapy

## 2016-05-27 ENCOUNTER — Encounter: Payer: Self-pay | Admitting: Physician Assistant

## 2016-05-27 DIAGNOSIS — M25561 Pain in right knee: Secondary | ICD-10-CM | POA: Diagnosis not present

## 2016-05-27 DIAGNOSIS — G8929 Other chronic pain: Secondary | ICD-10-CM

## 2016-05-27 DIAGNOSIS — M6281 Muscle weakness (generalized): Secondary | ICD-10-CM

## 2016-05-27 DIAGNOSIS — M25562 Pain in left knee: Secondary | ICD-10-CM

## 2016-05-27 NOTE — Therapy (Signed)
[facility] PHYSICAL AND SPORTS MEDICINE [address] [place], [place], [address] Phone: [phone]   Fax:  [phone]  Physical Therapy Treatment  Patient Details  Name: Alicia Mccann MRN: [id number] Date of Birth: [birth year] Referring Provider: Joycelyn ManBurnette, Jennifer NP  Encounter Date: 05/27/2016      PT End of Session - 05/27/16 1030    Visit Number 7   Number of Visits 12   Date for PT Re-Evaluation 06/05/16   Authorization Type 7   Authorization Time Period 10 (G-codes)   PT Start Time 0945   PT Stop Time 1030   PT Time Calculation (min) 45 min   Activity Tolerance Patient tolerated treatment well   Behavior During Therapy [facility] for tasks assessed/performed      Past Medical History:  Diagnosis Date  . Allergic rhinitis   . Anemia   . Anxiety   . Cellulitis   . Cholelithiasis   . Essential hypertension   . History of stress test    a. 12/2008 Ex MV: EF 78%, no ischemia.  . Insomnia   . Osteoarthritis   . Tachycardia   . Valvular heart disease    a. 12/2008 Echo: EF 65%, mild MR;  b. 2/6 SEM RUSB - insignificant murmur, prob Ao Sclerosis.    Past Surgical History:  Procedure Laterality Date  . BACK SURGERY    . BUNIONECTOMY    . CHOLECYSTECTOMY  03/29/2007  . GALLBLADDER SURGERY    . HIP SURGERY     bilateral   . THUMB ARTHROSCOPY      There were no vitals filed for this visit.      Subjective Assessment - 05/27/16 0950    Subjective Patient reports she is doing better with less back pain and knee pain today. She still cannot go up/down stairs without difficulty due to right knee pain/weakness.   Limitations Sitting;Standing;Walking;House hold activities   Patient Stated Goals To move better and improve dull ache in the back and knees.    Currently in Pain? Yes   Pain Score 3    Pain Location Knee   Pain Orientation Right;Left   Pain Descriptors / Indicators Aching   Pain Type Chronic pain   Pain Onset More  than a month ago   Pain Frequency Constant      Objective: Gait: improved trunk rotation and cadence as compared to previous session Strength: decreased quad firing, control bilaterally, decreased knee extension strength bilaterally 4-/5   Treatment: Therapeutic exercise: patient performed exercises with guidance, verbal and tactile cues and demonstration of PT: Sitting; Hip adduction with ball and glute sets x 10 Hip abduction with manual resistance, moderate intensity, x 10 reps with 5 second holds end range Seated SLR 2 x 5 reps each LE with repetition and repeated VC and demonstration to perform correctly Seated rows x 15 reps with red resistive band Straight arm pull downs with resistive band x 15 reps  Modalities; Electrical stimulation: russian stim. 10/10 cycle: applied electrodes to bilateral knees, VMO/quadriceps with patient performing quad sets each cycle and moist heat applied to same x 20min (no adverse effects noted following treatment)  Patient response to treatment: Patient reported decreased knee pain 25% and able to walk with less pain in right knee following treatment. She demonstrated improved technique with exercises, concentrated on seated SLR for quad control, strength with minimal pain in knees.            PT Education - 05/27/16 [id number]  Education provided Yes   Education Details HEP; re assessed home program and enforced 3x/week   Person(s) Educated Patient   Methods Explanation;Demonstration;Verbal cues   Comprehension Verbalized understanding;Returned demonstration;Verbal cues required             PT Long Term Goals - 04/24/16 1650      PT LONG TERM GOAL #1   Title Pt will score a 45/64 on the LEFS score by 06/05/16 to demonstrate significant improvement in lower extremity function and improved ability to ambulate without increased pain.   Baseline LEFS 20/64 (moderate-severe self perceived lower extremity dysfunction)   Status New      PT LONG TERM GOAL #2   Title Pt will score a <25%  on the MODI by 06/05/16 to demonstrate significant improvement in low back function and improved ability to perform bending/lifting activities.    Baseline MODI: 35%(Moderate self perceived low back dysfunction)   Status New     PT LONG TERM GOAL #3   Title Pt will be independent with exercise performance and progresion of exercises focused on improving Lumbar and LE weakness and pain by 06/05/16 to allow for improvement of symptoms after discharge of therapy.   Baseline limited knowledge of appropriate exercise performance and progression.    Status New               Plan - 05/27/16 1030    Clinical Impression Statement Patient demonstrates improvement with decreasing back symptoms/pain and bilateral knee pain and is compliant with home exercises. She continues with limitations of pain and weakness in both LE's right>left. She is responding well to current interventions and should continue to progress with additional physical therapy intervention.    Rehab Potential Good   PT Frequency 2x / week   PT Duration 6 weeks   PT Treatment/Interventions Moist Heat;Iontophoresis 4mg /ml Dexamethasone;Electrical Stimulation;Cryotherapy;Ultrasound;Therapeutic activities;Therapeutic exercise;Manual techniques;Patient/family education;Neuromuscular re-education   PT Next Visit Plan pain control, progressive exercises for stabilizaiton and LE strengthening, modalities for pain control and neuromuscular re education   PT Home Exercise Plan seated hip adduction with ball, glute sets, hip abduction with resistive band, quad sets, ankle resistive band exercises DF/eversion      Patient will benefit from skilled therapeutic intervention in order to improve the following deficits and impairments:  Decreased strength, Impaired flexibility, Pain, Decreased activity tolerance, Difficulty walking, Impaired perceived functional ability  Visit  Diagnosis: Muscle weakness (generalized)  Chronic pain of right knee  Chronic pain of left knee     Problem List Patient Active Problem List   Diagnosis Date Noted  . PAD (peripheral artery disease) (HCC) 05/19/2016  . Valvular heart disease   . Diverticulitis 05/25/2015  . Abdominal pain 05/24/2015  . Allergic rhinitis 04/30/2015  . Absolute anemia 04/30/2015  . Anxiety 04/30/2015  . Adult BMI 30+ 04/30/2015  . Carpal tunnel syndrome 04/30/2015  . Chronic LBP 04/30/2015  . Edema extremities 04/30/2015  . Cannot sleep 04/30/2015  . Arthritis sicca 04/30/2015  . Seasonal affective disorder (HCC) 04/30/2015  . Heart murmur 06/27/2013  . Essential hypertension 06/27/2013  . Obesity 06/27/2013  . Osteoarthritis 06/27/2013  . Hyperlipidemia 06/27/2013  . Coxitis 07/08/2012  . History of repair of hip joint 07/08/2012  . Cholelithiasis without obstruction 03/22/2007    Beacher May PT 05/28/2016, 11:43 AM  [facility] PHYSICAL AND SPORTS MEDICINE [address], [place], [address] Phone: [phone]   Fax:  [phone]  Name: Alicia Mccann MRN: [id number]  Date of Birth: 1933-08-03

## 2016-05-30 ENCOUNTER — Encounter: Payer: Self-pay | Admitting: Physical Therapy

## 2016-05-30 ENCOUNTER — Ambulatory Visit: Payer: Medicare Other | Admitting: Physical Therapy

## 2016-05-30 DIAGNOSIS — M25561 Pain in right knee: Secondary | ICD-10-CM

## 2016-05-30 DIAGNOSIS — M25562 Pain in left knee: Secondary | ICD-10-CM

## 2016-05-30 DIAGNOSIS — G8929 Other chronic pain: Secondary | ICD-10-CM

## 2016-05-30 DIAGNOSIS — M545 Low back pain: Secondary | ICD-10-CM

## 2016-05-30 DIAGNOSIS — M6281 Muscle weakness (generalized): Secondary | ICD-10-CM

## 2016-05-30 NOTE — Therapy (Signed)
[place] [facility] [address] [place], [place], [address] Phone: [phone]   Fax:  [phone]  Physical Therapy Treatment  Patient Details  Name: Alicia Mccann MRN: [id number] Date of Birth: 03/07/1933 Referring Provider: Joycelyn ManBurnette, Jennifer NP  Encounter Date: 05/30/2016      PT End of Session - 05/30/16 1035    Visit Number 8   Number of Visits 12   Date for PT Re-Evaluation 06/05/16   Authorization Type 8   Authorization Time Period 10 (G-codes)   PT Start Time 1031   PT Stop Time 1123   PT Time Calculation (min) 52 min   Activity Tolerance Patient tolerated treatment well   Behavior [facility] assessed/performed      Past Medical History:  Diagnosis Date  . Allergic rhinitis   . Anemia   . Anxiety   . Cellulitis   . Cholelithiasis   . Essential hypertension   . History of stress test    a. 12/2008 Ex MV: EF 78%, no ischemia.  . Insomnia   . Osteoarthritis   . Tachycardia   . Valvular heart disease    a. 12/2008 Echo: EF 65%, mild MR;  b. 2/6 SEM RUSB - insignificant murmur, prob Ao Sclerosis.    Past Surgical History:  Procedure Laterality Date  . BACK SURGERY    . BUNIONECTOMY    . CHOLECYSTECTOMY  03/29/2007  . GALLBLADDER SURGERY    . HIP SURGERY     bilateral   . THUMB ARTHROSCOPY      There were no vitals filed for this visit.      Subjective Assessment - 05/30/16 1032    Subjective Patient is still having pain in right knee, UE lower back, all on the right side. Overall she feels therapy is continuing to be helpful with pain control and improved function with walking, daily tasks.    Limitations Sitting;Standing;Walking;House hold activities   Patient Stated Goals To move better and improve dull ache in the back and knees.    Currently in Pain? Yes   Pain Score 3    Pain Location Knee   Pain Orientation Right;Left   Pain Descriptors / Indicators Aching   Pain Type Chronic pain   Pain Onset More than a month ago   Pain Frequency Constant      Objective: Gait: forward trunk flexion, decreased rotation  Palpation: + tenderness along bilateral spinal paraspinal muscles right>left mid to lower spine  Treatment: Therapeutic exercise: patient performed exercises with guidance, verbal and tactile cues and demonstration of therapist: Sitting; Hip adduction with ball and glute sets x 10 Hip abduction with doubled red resistive band with assistance x 15 reps Seated SLR 2 x 5 reps each LE with VC Seated rows x 15 reps with red resistive band Straight arm pull downs with resistive band x 15 reps Ankle DF and eversion with yellow resistive band x 10 reps with VC and demonstration/assistance  Manual therapy: STM thoracic to lumbar spine: patient seated superficial technique for muscles spasms and pain control Modalities; Electrical stimulation: russian stim. 10/10 cycle: applied electrodes to bilateral knees, VMO/quadriceps with patient performing quad sets each cycle and moist heat applied to same x 20min (no adverse effects noted following treatment)  Patient response to treatment: Patient demonstrated good technique with minimal VC for positioning, alignment of trunk, extremities. patient demonstrated decreased spasms by 50% along lumbar paraspinal muscles with decreased reported stiffness as well.  Improved ability to walk with less pain, soreness and improve quadriceps control, firing with estim.         PT Education - 05/30/16 1120    Education provided Yes   Education Details HEP:  sitting hip adduction with glute sets, hip abduction with resistive band,  scapular retraction and extension, ankle DF and eversion, hip flexion with resistive band   Person(s) Educated Patient   Methods Explanation;Demonstration;Verbal cues   Comprehension Verbalized understanding;Returned demonstration;Verbal cues required             PT Long Term  Goals - 04/24/16 1650      PT LONG TERM GOAL #1   Title Pt will score a 45/64 on the LEFS score by 06/05/16 to demonstrate significant improvement in lower extremity function and improved ability to ambulate without increased pain.   Baseline LEFS 20/64 (moderate-severe self perceived lower extremity dysfunction)   Status New     PT LONG TERM GOAL #2   Title Pt will score a <25%  on the MODI by 06/05/16 to demonstrate significant improvement in low back function and improved ability to perform bending/lifting activities.    Baseline MODI: 35%(Moderate self perceived low back dysfunction)   Status New     PT LONG TERM GOAL #3   Title Pt will be independent with exercise performance and progresion of exercises focused on improving Lumbar and LE weakness and pain by 06/05/16 to allow for improvement of symptoms after discharge of therapy.   Baseline limited knowledge of appropriate exercise performance and progression.    Status New               Plan - 05/30/16 1125    Clinical Impression Statement Patient demonstrates decreaseing knee pain with exercises and able to perform exercises with less cuing. she demonstrates good understanding of home exercises as instructed and is progressing well with estim. treatment for muscle re educaiton for quadriceps. She should continue to progress with additional physical therapy intervention.    Rehab Potential Good   PT Frequency 2x / week   PT Duration 6 weeks   PT Treatment/Interventions Moist Heat;Iontophoresis 4mg /ml Dexamethasone;Electrical Stimulation;Cryotherapy;Ultrasound;Therapeutic activities;Therapeutic exercise;Manual techniques;Patient/family education;Neuromuscular re-education   PT Next Visit Plan pain control, progressive exercises for stabilizaiton and LE strengthening, modalities for pain control and neuromuscular re education   PT Home Exercise Plan seated hip adduction with ball, glute sets, hip abduction with resistive band,  quad sets, ankle resistive band exercises DF/eversion      Patient will benefit from skilled therapeutic intervention in order to improve the following deficits and impairments:  Decreased strength, Impaired flexibility, Pain, Decreased activity tolerance, Difficulty walking, Impaired perceived functional ability  Visit Diagnosis: Muscle weakness (generalized)  Chronic pain of right knee  Chronic pain of left knee  Chronic bilateral low back pain without sciatica     Problem List Patient Active Problem List   Diagnosis Date Noted  . PAD (peripheral artery disease) (HCC) 05/19/2016  . Valvular heart disease   . Diverticulitis 05/25/2015  . Abdominal pain 05/24/2015  . Allergic rhinitis 04/30/2015  . Absolute anemia 04/30/2015  . Anxiety 04/30/2015  . Adult BMI 30+ 04/30/2015  . Carpal tunnel syndrome 04/30/2015  . Chronic LBP 04/30/2015  . Edema extremities 04/30/2015  . Cannot sleep 04/30/2015  . Arthritis sicca 04/30/2015  . Seasonal affective disorder (HCC) 04/30/2015  . Heart murmur 06/27/2013  . Essential hypertension 06/27/2013  . Obesity 06/27/2013  . Osteoarthritis 06/27/2013  . Hyperlipidemia 06/27/2013  .  Coxitis 07/08/2012  . History of repair of hip joint 07/08/2012  . Cholelithiasis without obstruction 03/22/2007    Beacher May PT 05/30/2016, 10:27 PM  [place] [facility] PHYSICAL AND SPORTS MEDICINE [address] [place], [address] Phone: [phone]   Fax:  [phone]  Name: Alicia Mccann MRN: [id number] Date of Birth: 01-Jul-1933

## 2016-06-03 ENCOUNTER — Ambulatory Visit: Payer: Medicare Other | Admitting: Physical Therapy

## 2016-06-03 ENCOUNTER — Encounter: Payer: Self-pay | Admitting: Physical Therapy

## 2016-06-03 DIAGNOSIS — M6281 Muscle weakness (generalized): Secondary | ICD-10-CM

## 2016-06-03 DIAGNOSIS — M545 Low back pain, unspecified: Secondary | ICD-10-CM

## 2016-06-03 DIAGNOSIS — M25561 Pain in right knee: Secondary | ICD-10-CM | POA: Diagnosis not present

## 2016-06-03 DIAGNOSIS — M25562 Pain in left knee: Secondary | ICD-10-CM

## 2016-06-03 DIAGNOSIS — G8929 Other chronic pain: Secondary | ICD-10-CM

## 2016-06-03 NOTE — Therapy (Signed)
[facility] [address], [place], [address] Phone: [phone]   Fax:  [phone]  Physical Therapy Treatment  Patient Details  Name: Alicia Mccann MRN: [id number] Date of Birth: 05/09/1933 Referring Provider: Joycelyn Man NP  Encounter Date: 06/03/2016      PT End of Session - 06/03/16 1715    Visit Number 9   Number of Visits 12   Date for PT Re-Evaluation 06/05/16   Authorization Type 9   Authorization Time Period 10 (G-codes)   PT Start Time 1710/01/22   PT Stop Time 1740   PT Time Calculation (min) 29 min   Activity Tolerance Patient tolerated treatment well   Behavior During Therapy [facility] for tasks assessed/performed      Past Medical History:  Diagnosis Date  . Allergic rhinitis   . Anemia   . Anxiety   . Cellulitis   . Cholelithiasis   . Essential hypertension   . History of stress test    a. 01/22/09 Ex MV: EF 78%, no ischemia.  . Insomnia   . Osteoarthritis   . Tachycardia   . Valvular heart disease    a. 22-Jan-2009 Echo: EF 65%, mild MR;  b. 2/6 SEM RUSB - insignificant murmur, prob Ao Sclerosis.    Past Surgical History:  Procedure Laterality Date  . BACK SURGERY    . BUNIONECTOMY    . CHOLECYSTECTOMY  03/29/2007  . GALLBLADDER SURGERY    . HIP SURGERY     bilateral   . THUMB ARTHROSCOPY      There were no vitals filed for this visit.      Subjective Assessment - 06/03/16 1714    Subjective Patient reports she has been cleaning house and sweeping today and is sore in her back and knees. Requests heat and estim. only for pain control and will exercise at home tomorrow.    Limitations Sitting;Standing;Walking;House hold activities   Patient Stated Goals To move better and improve dull ache in the back and knees.    Currently in Pain? Yes   Pain Score 3    Pain Location Knee   Pain Orientation Right   Pain Descriptors / Indicators Aching   Pain Type Chronic pain   Pain Onset More than a month ago   Pain Frequency Constant      Objective: Gait: slow cadence with decreased trunk rotation LE's: quadriceps control decreased with limited ability to perform quad sets  Treatment: Re assessed exercises to be performed at home: stabilization with ball and band in sitting for hips Modalities; Electrical stimulation: russian stim. 10/10 cycle: applied electrodes to bilateral knees, VMO/quadriceps with patient performing quad sets each cycle andmoist heat applied to same and moist heat to lower back  x (no adverse effects noted following treatment)  Patient response to treatment: Patient demonstrated good control in both LE's with quad setting following re education with [place] and decreased stiffness in knees and back with walking by 50% following session for pain control today. She verbalized good understanding of home program.        PT Education - 06/03/16 1743    Education provided Yes   Education Details HEP: perform exercises 3-4 days/week to improve strength and endurance; use of joint progrection techniques to control pain   Person(s) Educated Patient   Methods Explanation   Comprehension Verbalized understanding             PT Long Term Goals -  04/24/16 1650      PT LONG TERM GOAL #1   Title Pt will score a 45/64 on the LEFS score by 06/05/16 to demonstrate significant improvement in lower extremity function and improved ability to ambulate without increased pain.   Baseline LEFS 20/64 (moderate-severe self perceived lower extremity dysfunction)   Status New     PT LONG TERM GOAL #2   Title Pt will score a <25%  on the MODI by 06/05/16 to demonstrate significant improvement in low back function and improved ability to perform bending/lifting activities.    Baseline MODI: 35%(Moderate self perceived low back dysfunction)   Status New     PT LONG TERM GOAL #3   Title Pt will be independent with exercise performance and  progresion of exercises focused on improving Lumbar and LE weakness and pain by 06/05/16 to allow for improvement of symptoms after discharge of therapy.   Baseline limited knowledge of appropriate exercise performance and progression.    Status New               Plan - 06/03/16 1745    Clinical Impression Statement Patient treatment limited today to pain control with electrical stimulation and moist heat with good results and 0/10 pain in lower back and knees at end of session. she wll continue with exercises at home    Rehab Potential Good   PT Frequency 2x / week   PT Duration 6 weeks   PT Treatment/Interventions Moist Heat;Iontophoresis 4mg /ml Dexamethasone;Electrical Stimulation;Cryotherapy;Ultrasound;Therapeutic activities;Therapeutic exercise;Manual techniques;Patient/family education;Neuromuscular re-education   PT Next Visit Plan pain control, progressive exercises for stabilizaiton and LE strengthening, modalities for pain control and neuromuscular re education. Re assess outcome measures and determine further needs vs discharge   PT Home Exercise Plan seated hip adduction with ball, glute sets, hip abduction with resistive band, quad sets, ankle resistive band exercises DF/eversion      Patient will benefit from skilled therapeutic intervention in order to improve the following deficits and impairments:  Decreased strength, Impaired flexibility, Pain, Decreased activity tolerance, Difficulty walking, Impaired perceived functional ability  Visit Diagnosis: Muscle weakness (generalized)  Chronic pain of right knee  Chronic pain of left knee  Chronic bilateral low back pain without sciatica     Problem List Patient Active Problem List   Diagnosis Date Noted  . PAD (peripheral artery disease) (HCC) 05/19/2016  . Valvular heart disease   . Diverticulitis 05/25/2015  . Abdominal pain 05/24/2015  . Allergic rhinitis 04/30/2015  . Absolute anemia 04/30/2015  .  Anxiety 04/30/2015  . Adult BMI 30+ 04/30/2015  . Carpal tunnel syndrome 04/30/2015  . Chronic LBP 04/30/2015  . Edema extremities 04/30/2015  . Cannot sleep 04/30/2015  . Arthritis sicca 04/30/2015  . Seasonal affective disorder (HCC) 04/30/2015  . Heart murmur 06/27/2013  . Essential hypertension 06/27/2013  . Obesity 06/27/2013  . Osteoarthritis 06/27/2013  . Hyperlipidemia 06/27/2013  . Coxitis 07/08/2012  . History of repair of hip joint 07/08/2012  . Cholelithiasis without obstruction 03/22/2007    Beacher MayBrooks, Marie PT 06/04/2016, 2:59 PM  [place] [facility] [address] [place], [place], [address] Phone: [phone]   Fax:  [phone]  Name: Lula OlszewskiJacqueline L Louks MRN: [id number] Date of Birth: 1933-06-13

## 2016-06-05 ENCOUNTER — Ambulatory Visit: Payer: Medicare Other | Admitting: Physical Therapy

## 2016-06-05 ENCOUNTER — Encounter: Payer: Self-pay | Admitting: Physical Therapy

## 2016-06-05 DIAGNOSIS — G8929 Other chronic pain: Secondary | ICD-10-CM

## 2016-06-05 DIAGNOSIS — M6281 Muscle weakness (generalized): Secondary | ICD-10-CM

## 2016-06-05 DIAGNOSIS — M25562 Pain in left knee: Secondary | ICD-10-CM

## 2016-06-05 DIAGNOSIS — M25561 Pain in right knee: Secondary | ICD-10-CM

## 2016-06-06 NOTE — Therapy (Signed)
[place] [facility]TS MEDICINE [address] [place], [address] Phone: [phone]   Fax:  [phone]  Physical Therapy Treatment/Discharge Summary  Patient Details  Name: Alicia Mccann MRN: [id number] Date of Birth: 08/21/1932 Referring Provider: Joycelyn Man NP  Encounter Date: 06/05/2016   Patient began physical therapy 04/24/2016 and attended 10 session through 06/05/2016 with goals achieved and patient independent with home program for self management of exercises.       PT End of Session - 06/05/16 1107    Visit Number 10   Number of Visits 12   Date for PT Re-Evaluation 06/05/16   Authorization Type 10   Authorization Time Period 10 (G-codes)   PT Start Time 1038   PT Stop Time 1117   PT Time Calculation (min) 39 min   Activity Tolerance Patient tolerated treatment well   Behavior During Therapy [facility] for tasks assessed/performed      Past Medical History:  Diagnosis Date  . Allergic rhinitis   . Anemia   . Anxiety   . Cellulitis   . Cholelithiasis   . Essential hypertension   . History of stress test    a. 12/2008 Ex MV: EF 78%, no ischemia.  . Insomnia   . Osteoarthritis   . Tachycardia   . Valvular heart disease    a. 12/2008 Echo: EF 65%, mild MR;  b. 2/6 SEM RUSB - insignificant murmur, prob Ao Sclerosis.    Past Surgical History:  Procedure Laterality Date  . BACK SURGERY    . BUNIONECTOMY    . CHOLECYSTECTOMY  03/29/2007  . GALLBLADDER SURGERY    . HIP SURGERY     bilateral   . THUMB ARTHROSCOPY      There were no vitals filed for this visit.      Subjective Assessment - 06/05/16 1042    Subjective Patient reports she is "100%" better with knee pain and back pain since beginning physical therapy intervention. She is now able to perform home exercises independently and agrees to discharge. She continues with mild pain in knees and back intermittently and reports the electrical  stimulation has mad a significant difference in improving pain.    Pertinent History --   Limitations Sitting;Standing;Walking;House hold activities   Patient Stated Goals To move better and improve dull ache in the back and knees.    Currently in Pain? Yes   Pain Score 2    Pain Location Knee   Pain Orientation Right  left intermittently   Pain Descriptors / Indicators Aching;Other (Comment)  aggravating nusance   Pain Type Chronic pain   Pain Onset More than a month ago   Pain Frequency Constant      Objective: Gait: mild antalgic gait pattern due to right knee pain Strength: both LE's hip flexion, abduction, ER at least 4/5  Outcome measure: LEFS 47/64 (mild self perceived disability) MODI: 23% (mild self perceived disability)   Treatment: Therapeutic exercise: Re assessed home program for core strengthening patient performed exercises with guidance, verbal and tactile cues and demonstration of therapist: Sitting; Hip adduction with ball and glute sets x 10 Hip abduction with green resistive band with assistance x 15 reps Seated SLR 2 x 5 reps each LE with VC Seated rows x 15 reps with red resistive band Straight arm pull downs with resistive band x 15 reps Ankle DF and eversion with yellow resistive band x 10 reps with VC and demonstration/assistance  Modalities; Electrical stimulation:  russian stim. 10/10 cycle: applied electrodes to bilateral knees, VMO/quadriceps with patient performing quad sets each cycle andmoist heat applied to same x 20min (no adverse effects noted following treatment)  Patient response to treatment: Patient demonstrated improved technique with exercises with minimal VC for correct alignment. Patient with no increased pain reported in knees. Improved firing of quadriceps with estim.        PT Education - 06/05/16 1106    Education provided Yes   Education Details HEP: re assessed exercises for stabilization and LE strengthening     Person(s) Educated Patient   Methods Explanation;Demonstration;Verbal cues   Comprehension Verbalized understanding;Returned demonstration;Verbal cues required             PT Long Term Goals - 06/05/16 1120      PT LONG TERM GOAL #1   Title Pt will score a 45/64 on the LEFS score by 06/05/16 to demonstrate significant improvement in lower extremity function and improved ability to ambulate without increased pain.   Baseline LEFS 20/64 (moderate-severe self perceived lower extremity dysfunction) current 47/64   Status Achieved     PT LONG TERM GOAL #2   Title Pt will score a <25%  on the MODI by 06/05/16 to demonstrate significant improvement in low back function and improved ability to perform bending/lifting activities.    Baseline MODI: 35%(Moderate self perceived low back dysfunction) MODI 06/05/2016 23%   Status Achieved     PT LONG TERM GOAL #3   Title Pt will be independent with exercise performance and progresion of exercises focused on improving Lumbar and LE weakness and pain by 06/05/16 to allow for improvement of symptoms after discharge of therapy.   Baseline limited knowledge of appropriate exercise performance and progression.    Status Achieved               Plan - 06/05/16 1118    Clinical Impression Statement Patient has achieved goals for decreasing pain to mild and able to perform self management of symptoms and home program to continue to control symptoms independently. Patient agrees to and is ready for discharge.    Rehab Potential Good   PT Frequency 2x / week   PT Duration 6 weeks   PT Treatment/Interventions Moist Heat;Iontophoresis 4mg /ml Dexamethasone;Electrical Stimulation;Cryotherapy;Ultrasound;Therapeutic activities;Therapeutic exercise;Manual techniques;Patient/family education;Neuromuscular re-education   PT Next Visit Plan discharge to home program   PT Home Exercise Plan seated hip adduction with ball, glute sets, hip abduction with  resistive band, quad sets, ankle resistive band exercises DF/eversion   Consulted and Agree with Plan of Care Patient      Patient will benefit from skilled therapeutic intervention in order to improve the following deficits and impairments:  Decreased strength, Impaired flexibility, Pain, Decreased activity tolerance, Difficulty walking, Impaired perceived functional ability  Visit Diagnosis: Muscle weakness (generalized)  Chronic pain of right knee  Chronic pain of left knee       G-Codes - 06/05/16 1119    Functional Assessment Tool Used modified oswestry, LEFS, pain scale, clinical judgment, strength deficits   Functional Limitation Mobility: Walking and moving around   Mobility: Walking and Moving Around Goal Status [phone](G8979) At least 1 percent but less than 20 percent impaired, limited or restricted   Mobility: Walking and Moving Around Discharge Status [phone](G8980) At least 1 percent but less than 20 percent impaired, limited or restricted      Problem List Patient Active Problem List   Diagnosis Date Noted  . PAD (peripheral artery disease) (HCC) 05/19/2016  .  Valvular heart disease   . Diverticulitis 05/25/2015  . Abdominal pain 05/24/2015  . Allergic rhinitis 04/30/2015  . Absolute anemia 04/30/2015  . Anxiety 04/30/2015  . Adult BMI 30+ 04/30/2015  . Carpal tunnel syndrome 04/30/2015  . Chronic LBP 04/30/2015  . Edema extremities 04/30/2015  . Cannot sleep 04/30/2015  . Arthritis sicca 04/30/2015  . Seasonal affective disorder (HCC) 04/30/2015  . Heart murmur 06/27/2013  . Essential hypertension 06/27/2013  . Obesity 06/27/2013  . Osteoarthritis 06/27/2013  . Hyperlipidemia 06/27/2013  . Coxitis 07/08/2012  . History of repair of hip joint 07/08/2012  . Cholelithiasis without obstruction 03/22/2007    Beacher May PT 06/06/2016, 4:17 PM  [place] [facility] PHYSICAL AND SPORTS MEDICINE [address], [place],  [address] Phone: [phone]   Fax:  [phone]  Name: Alicia Mccann MRN: [id number] Date of Birth: [birth year]

## 2016-09-14 ENCOUNTER — Other Ambulatory Visit: Payer: Self-pay | Admitting: Family Medicine

## 2016-09-14 DIAGNOSIS — I10 Essential (primary) hypertension: Secondary | ICD-10-CM

## 2016-10-10 ENCOUNTER — Telehealth: Payer: Self-pay | Admitting: Family Medicine

## 2016-10-10 ENCOUNTER — Telehealth: Payer: Self-pay

## 2016-10-10 DIAGNOSIS — D649 Anemia, unspecified: Secondary | ICD-10-CM

## 2016-10-10 DIAGNOSIS — E7800 Pure hypercholesterolemia, unspecified: Secondary | ICD-10-CM

## 2016-10-10 DIAGNOSIS — I10 Essential (primary) hypertension: Secondary | ICD-10-CM

## 2016-10-10 NOTE — Telephone Encounter (Signed)
Pt called to report she was due for lab work in the fall.  She forgot about it and would like to try and get it done next week.  Please call when ready.

## 2016-10-10 NOTE — Telephone Encounter (Signed)
Advised. appt made

## 2016-10-10 NOTE — Telephone Encounter (Signed)
Pt declined visit with [facility]. Will have her annual CPE with jenni.

## 2016-10-10 NOTE — Telephone Encounter (Signed)
Labs ordered. Patient is going to need an appt with either me or Dr. Sherrie MustacheFisher. Dr. Sherrie MustacheFisher is listed as her PCP and she has not seen either one of us since Dr. Elease HashimotoMaloney left.

## 2016-10-10 NOTE — Telephone Encounter (Signed)
Called Pt to schedule AWV with NHA - knb °

## 2016-10-14 ENCOUNTER — Ambulatory Visit: Payer: Medicare Other | Attending: Rheumatology | Admitting: Occupational Therapy

## 2016-10-14 DIAGNOSIS — M25641 Stiffness of right hand, not elsewhere classified: Secondary | ICD-10-CM | POA: Diagnosis present

## 2016-10-14 DIAGNOSIS — M25642 Stiffness of left hand, not elsewhere classified: Secondary | ICD-10-CM

## 2016-10-14 DIAGNOSIS — M6281 Muscle weakness (generalized): Secondary | ICD-10-CM

## 2016-10-14 NOTE — Therapy (Signed)
[facility] [address], [place], [address] Phone: [phone]   Fax:  [phone]  Occupational Therapy Evaluation  Patient Details  Name: Alicia Mccann MRN: [id number] Date of Birth: Jun 18, 1933 Referring Provider: Gavin Potters  Encounter Date: 10/14/2016      OT End of Session - 10/14/16 1604    Visit Number 1   Number of Visits 4   Date for OT Re-Evaluation 11/11/16   OT Start Time 1016   OT Stop Time 1114   OT Time Calculation (min) 58 min   Activity Tolerance Patient tolerated treatment well   Behavior During Therapy [facility] for tasks assessed/performed      Past Medical History:  Diagnosis Date  . Allergic rhinitis   . Anemia   . Anxiety   . Cellulitis   . Cholelithiasis   . Essential hypertension   . History of stress test    a. 12/2008 Ex MV: EF 78%, no ischemia.  . Insomnia   . Osteoarthritis   . Tachycardia   . Valvular heart disease    a. 12/2008 Echo: EF 65%, mild MR;  b. 2/6 SEM RUSB - insignificant murmur, prob Ao Sclerosis.    Past Surgical History:  Procedure Laterality Date  . BACK SURGERY    . BUNIONECTOMY    . CHOLECYSTECTOMY  03/29/2007  . GALLBLADDER SURGERY    . HIP SURGERY     bilateral   . THUMB ARTHROSCOPY      There were no vitals filed for this visit.      Subjective Assessment - 10/14/16 1021    Subjective  Both of my  hands getting weaker - cannot open package or jars, writing some days, buttons , jewelry - do not have pain - and this cyst on my R wrist stays about the same - do not bother me    Patient Stated Goals Get more strength in my hands ,  not drop things - do not want my hands  to get worse    Currently in Pain? No/denies           [facility] OT Assessment - 10/14/16 0001      Assessment   Diagnosis OA of hands    Referring Provider Gavin Potters   Onset Date 10/06/16     Home  Environment   Lives With Alone     Prior Function   Vocation  Retired   Leisure R hand dominant - dog , yard, read,words on phone , own house work ,      Chiropractor Grip (lbs) 32   Right Hand Lateral Pinch 8 lbs   Right Hand 3 Point Pinch 6 lbs   Left Hand Grip (lbs) 40   Left Hand Lateral Pinch 9 lbs     Right Hand AROM   R Thumb MCP 0-60 55 Degrees   R Thumb IP 0-80 45 Degrees   R Thumb Opposition to Index --  Opposition to 2nd fold of 5th    R Index  MCP 0-90 85 Degrees   R Long  MCP 0-90 85 Degrees   R Little PIP 0-100 95 Degrees     Left Hand AROM   L Thumb MCP 0-60 50 Degrees   L Thumb IP 0-80 55 Degrees   L Thumb Opposition to Index --  Opposition t obase of 5th    L Index  MCP 0-90 80 Degrees   L Long  MCP 0-90 80 Degrees   L Ring  MCP 0-90 80 Degrees   L Little  MCP 0-90 90 Degrees     reviewed with pt HEP and joint protection - hand out provided   Heat  Tendon glides Opposition  RD of digits   light blue putty for - grip , lat and 3 point grip  10 reps each  2 x day   Joint protection and AE hand out provided and reviewed with pt                    OT Education - 10/14/16 1603    Education provided Yes   Education Details findings of eval and HEP - jointprotection reviewed   Person(s) Educated Patient   Methods Explanation;Demonstration;Tactile cues;Verbal cues;Handout   Comprehension Verbal cues required;Returned demonstration;Verbalized understanding             OT Long Term Goals - 10/14/16 1609      OT LONG TERM GOAL #1   Title Pt to be ind in HEP to increase grip and prehension in R /L hand by 2-5 lbs , increase/maintain ROM  in digits     Baseline very little knowledge   Time 3   Period Weeks   Status New     OT LONG TERM GOAL #2   Title Pt verbalize  3 joint protection and AE to increase ease of performing ADL's  and IADL;s at home    Baseline very little knowledge   Time 4   Period Weeks   Status New               Plan - 10/14/16 1604    Clinical  Impression Statement Pt present with diagnosis of OA of hand - increase weakness in hands - pt show decrease grip more than L , on lower range for age- AROM WNL except MC's of 2nd and 3rd decrease by 5-10 degrees - pt do have arthritic changes in DIP 's of hands and thumb CMC - history of CTR and CMC arthroplasty on R hand - because of CMC OA pt unable to do pad on pad 2 and 3 point pinch - as well as digits ulnar deviating - pt was provided with HEP for ROM , did provide putty for grip and prehension because of lower range for age in strength and pt denies pain -  do report CTS in L  - but was negartive for Tinel and phalens - do sleep with brace    Rehab Potential Fair   OT Frequency 1x / week   OT Duration 4 weeks   OT Treatment/Interventions Self-care/ADL training;Patient/family education;Therapeutic exercises;Manual Therapy   Plan assess improvement with homeprogram of joint protection < ROM and strenghtening    OT Home Exercise Plan see pt instruction   Consulted and Agree with Plan of Care Patient      Patient will benefit from skilled therapeutic intervention in order to improve the following deficits and impairments:  Decreased range of motion, Impaired flexibility, Decreased knowledge of use of DME, Impaired UE functional use, Decreased strength  Visit Diagnosis: Stiffness of left hand, not elsewhere classified - Plan: Ot plan of care cert/re-cert  Stiffness of right hand, not elsewhere classified - Plan: Ot plan of care cert/re-cert  Muscle weakness (generalized) - Plan: Ot plan of care cert/re-cert    Problem List Patient Active Problem List   Diagnosis Date Noted  . PAD (peripheral artery disease) (HCC) 05/19/2016  .  Valvular heart disease   . Diverticulitis 05/25/2015  . Abdominal pain 05/24/2015  . Allergic rhinitis 04/30/2015  . Absolute anemia 04/30/2015  . Anxiety 04/30/2015  . Adult BMI 30+ 04/30/2015  . Carpal tunnel syndrome 04/30/2015  . Chronic LBP 04/30/2015   . Edema extremities 04/30/2015  . Cannot sleep 04/30/2015  . Arthritis sicca 04/30/2015  . Seasonal affective disorder (HCC) 04/30/2015  . Heart murmur 06/27/2013  . Essential hypertension 06/27/2013  . Obesity 06/27/2013  . Osteoarthritis 06/27/2013  . Hyperlipidemia 06/27/2013  . Coxitis 07/08/2012  . History of repair of hip joint 07/08/2012  . Cholelithiasis without obstruction 03/22/2007    Oletta CohnuPreez, Arretta Toenjes OTR/L,CLT 10/14/2016, 4:15 PM  [facility] [address] [place], [place], [address] Phone: [phone]   Fax:  [phone]  Name: Alicia Mccann MRN: [id number] Date of Birth: [birth year]

## 2016-10-14 NOTE — Patient Instructions (Signed)
Heat  Tendon glides Opposition  RD of digits   light blue putty for - grip , lat and 3 point grip  10 reps each  2 x day   Joint protection and AE hand out provided and reviewed with pt

## 2016-10-16 ENCOUNTER — Telehealth: Payer: Self-pay

## 2016-10-16 LAB — CBC WITH DIFFERENTIAL/PLATELET
BASOS: 1 %
Basophils Absolute: 0.1 10*3/uL (ref 0.0–0.2)
EOS (ABSOLUTE): 0.3 10*3/uL (ref 0.0–0.4)
EOS: 3 %
HEMATOCRIT: 37.8 % (ref 34.0–46.6)
HEMOGLOBIN: 12.6 g/dL (ref 11.1–15.9)
IMMATURE GRANULOCYTES: 0 %
Immature Grans (Abs): 0 10*3/uL (ref 0.0–0.1)
Lymphocytes Absolute: 2.8 10*3/uL (ref 0.7–3.1)
Lymphs: 28 %
MCH: 29.8 pg (ref 26.6–33.0)
MCHC: 33.3 g/dL (ref 31.5–35.7)
MCV: 89 fL (ref 79–97)
MONOS ABS: 0.6 10*3/uL (ref 0.1–0.9)
Monocytes: 6 %
NEUTROS PCT: 62 %
Neutrophils Absolute: 6.4 10*3/uL (ref 1.4–7.0)
Platelets: 353 10*3/uL (ref 150–379)
RBC: 4.23 x10E6/uL (ref 3.77–5.28)
RDW: 12.9 % (ref 12.3–15.4)
WBC: 10.2 10*3/uL (ref 3.4–10.8)

## 2016-10-16 LAB — COMPREHENSIVE METABOLIC PANEL
A/G RATIO: 2.2 (ref 1.2–2.2)
ALBUMIN: 4.3 g/dL (ref 3.5–4.7)
ALT: 18 IU/L (ref 0–32)
AST: 14 IU/L (ref 0–40)
Alkaline Phosphatase: 69 IU/L (ref 39–117)
BUN / CREAT RATIO: 23 (ref 12–28)
BUN: 26 mg/dL (ref 8–27)
Bilirubin Total: 0.5 mg/dL (ref 0.0–1.2)
CALCIUM: 10 mg/dL (ref 8.7–10.3)
CO2: 24 mmol/L (ref 18–29)
Chloride: 101 mmol/L (ref 96–106)
Creatinine, Ser: 1.12 mg/dL — ABNORMAL HIGH (ref 0.57–1.00)
GFR, EST AFRICAN AMERICAN: 53 mL/min/{1.73_m2} — AB (ref >59)
GFR, EST NON AFRICAN AMERICAN: 46 mL/min/{1.73_m2} — AB (ref >59)
Globulin, Total: 2 g/dL (ref 1.5–4.5)
Glucose: 88 mg/dL (ref 65–99)
POTASSIUM: 4 mmol/L (ref 3.5–5.2)
SODIUM: 141 mmol/L (ref 134–144)
TOTAL PROTEIN: 6.3 g/dL (ref 6.0–8.5)

## 2016-10-16 LAB — LIPID PANEL
CHOL/HDL RATIO: 3.6 ratio (ref 0.0–4.4)
Cholesterol, Total: 182 mg/dL (ref 100–199)
HDL: 51 mg/dL (ref >39)
LDL CALC: 102 mg/dL — AB (ref 0–99)
Triglycerides: 147 mg/dL (ref 0–149)
VLDL Cholesterol Cal: 29 mg/dL (ref 5–40)

## 2016-10-16 NOTE — Telephone Encounter (Signed)
Patient advised.

## 2016-10-16 NOTE — Telephone Encounter (Signed)
-----   Message from Margaretann LovelessJennifer M Burnette, PA-C sent at 10/16/2016  9:43 AM EST ----- All labs are within normal limits and stable. Cholesterol improved from last year. Thanks! -JB

## 2016-10-16 NOTE — Telephone Encounter (Signed)
LMTCB  Thanks,  -Joseline 

## 2016-10-22 ENCOUNTER — Ambulatory Visit: Payer: Self-pay | Admitting: Physician Assistant

## 2016-10-27 ENCOUNTER — Ambulatory Visit: Payer: Medicare Other | Admitting: Occupational Therapy

## 2016-11-03 ENCOUNTER — Ambulatory Visit: Payer: Medicare Other | Attending: Rheumatology | Admitting: Occupational Therapy

## 2016-11-03 DIAGNOSIS — M25641 Stiffness of right hand, not elsewhere classified: Secondary | ICD-10-CM

## 2016-11-03 DIAGNOSIS — M25642 Stiffness of left hand, not elsewhere classified: Secondary | ICD-10-CM | POA: Diagnosis not present

## 2016-11-03 DIAGNOSIS — M6281 Muscle weakness (generalized): Secondary | ICD-10-CM | POA: Insufficient documentation

## 2016-11-03 NOTE — Therapy (Signed)
[facility] [address] [place], [place], [address] Phone: [phone]   Fax:  [phone]  Occupational Therapy Treatment  Patient Details  Name: Alicia Mccann MRN: [id number] Date of Birth: 1933/04/29 Referring Provider: Gavin PottersKernodle  Encounter Date: 11/03/2016      OT End of Session - 11/03/16 1600    Visit Number 2   Number of Visits 4   Date for OT Re-Evaluation 11/11/16   OT Start Time 1115   OT Stop Time 1147   OT Time Calculation (min) 32 min   Activity Tolerance Patient tolerated treatment well   Behavior During Therapy [facility] for tasks assessed/performed      Past Medical History:  Diagnosis Date  . Allergic rhinitis   . Anemia   . Anxiety   . Cellulitis   . Cholelithiasis   . Essential hypertension   . History of stress test    a. 12/2008 Ex MV: EF 78%, no ischemia.  . Insomnia   . Osteoarthritis   . Tachycardia   . Valvular heart disease    a. 12/2008 Echo: EF 65%, mild MR;  b. 2/6 SEM RUSB - insignificant murmur, prob Ao Sclerosis.    Past Surgical History:  Procedure Laterality Date  . BACK SURGERY    . BUNIONECTOMY    . CHOLECYSTECTOMY  03/29/2007  . GALLBLADDER SURGERY    . HIP SURGERY     bilateral   . THUMB ARTHROSCOPY      There were no vitals filed for this visit.      Subjective Assessment - 11/03/16 1551    Subjective  MY hands feel about the same - but I am paying more attention how I use them - I did the exercises -but you need to go over couple of them again - no pain in my hands   Patient Stated Goals Get more strength in my hands ,  not drop things - do not want my hands  to get worse    Currently in Pain? No/denies            [facility] Assessment - 11/03/16 0001      Strength   Right Hand Grip (lbs) 36   Right Hand Lateral Pinch 9 lbs   Right Hand 3 Point Pinch 8 lbs   Left Hand Grip (lbs) 40   Left Hand Lateral Pinch 9 lbs   Left Hand 3 Point Pinch 6  lbs       Measurements taken See flowsheet   Reviewed again joint protection principles with pt - larger joints and avoid tight and sustained grips Pt starting to use them - but hard to change her ways  Review HEP for  Tendon glides   opposition  RD of digits Light blue putty for grip , lat and 3 point grip 10 reps and 2 sets  Needed min A and min v/c                      OT Education - 11/03/16 1556    Education provided Yes   Education Details HEP and joint protection reviewed    Person(s) Educated Patient   Methods Explanation;Demonstration;Tactile cues;Verbal cues   Comprehension Verbal cues required;Returned demonstration             OT Long Term Goals - 11/03/16 1604      OT LONG TERM GOAL #1   Title Pt to be ind in  HEP to increase grip and prehension in R /L hand by 2-5 lbs , increase/maintain ROM  in digits     Status Achieved     OT LONG TERM GOAL #2   Title Pt verbalize  3 joint protection and AE to increase ease of performing ADL's  and IADL;s at home    Status Achieved               Plan - 11/03/16 1601    Clinical Impression Statement Pt showed increase grip and prehension strength in R hand - L the same- AROM is WFL - and no pain - pt has knowledge on joint protection principles and HEP to cont to maintain ROM and increase strength - pt to contact me if any issues in the next 3 wks - other wise can cont with HEP    OT Treatment/Interventions Self-care/ADL training;Patient/family education;Therapeutic exercises;Manual Therapy   Plan discharge instructions to cont with    OT Home Exercise Plan see pt instruction   Consulted and Agree with Plan of Care Patient      Patient will benefit from skilled therapeutic intervention in order to improve the following deficits and impairments:     Visit Diagnosis: Stiffness of left hand, not elsewhere classified  Stiffness of right hand, not elsewhere classified  Muscle weakness  (generalized)    Problem List Patient Active Problem List   Diagnosis Date Noted  . PAD (peripheral artery disease) (HCC) 05/19/2016  . Valvular heart disease   . Diverticulitis 05/25/2015  . Abdominal pain 05/24/2015  . Allergic rhinitis 04/30/2015  . Absolute anemia 04/30/2015  . Anxiety 04/30/2015  . Adult BMI 30+ 04/30/2015  . Carpal tunnel syndrome 04/30/2015  . Chronic LBP 04/30/2015  . Edema extremities 04/30/2015  . Cannot sleep 04/30/2015  . Arthritis sicca 04/30/2015  . Seasonal affective disorder (HCC) 04/30/2015  . Heart murmur 06/27/2013  . Essential hypertension 06/27/2013  . Obesity 06/27/2013  . Osteoarthritis 06/27/2013  . Hyperlipidemia 06/27/2013  . Coxitis 07/08/2012  . History of repair of hip joint 07/08/2012  . Cholelithiasis without obstruction 03/22/2007    Oletta Cohn OTR/L,CLT 11/03/2016, 4:05 PM  [facility]IC[facility] [address] [place], [address] Phone: [phone]   Fax:  [phone]  Name: Alicia Mccann MRN: [id number] Date of Birth: Apr 06, 1933

## 2016-11-03 NOTE — Patient Instructions (Signed)
Cont with joint protection principles - larger joints and avoid tight and sustained grips  Tendon glides   opposition  RD of digits Light blue putty for grip , lat and 3 point grip 10 reps and 2 sets

## 2016-11-11 ENCOUNTER — Ambulatory Visit (INDEPENDENT_AMBULATORY_CARE_PROVIDER_SITE_OTHER): Payer: Medicare Other | Admitting: Physician Assistant

## 2016-11-11 ENCOUNTER — Encounter: Payer: Self-pay | Admitting: Physician Assistant

## 2016-11-11 VITALS — BP 140/70 | HR 79 | Temp 98.2°F | Resp 16 | Wt 170.8 lb

## 2016-11-11 DIAGNOSIS — E7800 Pure hypercholesterolemia, unspecified: Secondary | ICD-10-CM | POA: Diagnosis not present

## 2016-11-11 DIAGNOSIS — F419 Anxiety disorder, unspecified: Secondary | ICD-10-CM

## 2016-11-11 DIAGNOSIS — I739 Peripheral vascular disease, unspecified: Secondary | ICD-10-CM | POA: Diagnosis not present

## 2016-11-11 DIAGNOSIS — F339 Major depressive disorder, recurrent, unspecified: Secondary | ICD-10-CM

## 2016-11-11 DIAGNOSIS — F338 Other recurrent depressive disorders: Secondary | ICD-10-CM

## 2016-11-11 DIAGNOSIS — I10 Essential (primary) hypertension: Secondary | ICD-10-CM | POA: Diagnosis not present

## 2016-11-11 DIAGNOSIS — Z1231 Encounter for screening mammogram for malignant neoplasm of breast: Secondary | ICD-10-CM | POA: Diagnosis not present

## 2016-11-11 DIAGNOSIS — Z1239 Encounter for other screening for malignant neoplasm of breast: Secondary | ICD-10-CM

## 2016-11-11 MED ORDER — ALPRAZOLAM 0.25 MG PO TABS: 0.2500 mg | ORAL_TABLET | Freq: Every evening | ORAL | 5 refills | Status: DC | PRN

## 2016-11-11 NOTE — Patient Instructions (Signed)
Seasonal Affective Disorder Seasonal affective disorder (SAD) is a form of depression. It is when you feel sad, down, or blue at specific times of the year. The most common time of year for this is late fall and winter, when the days are shorter and most people spend less time outdoors. This is why SAD is also known as the "winter blues." SAD occurs less commonly in the spring or summer. SAD can vary in severity and can interfere with work, school, relationships, and normal daily activities. What increases the risk? This condition is more common in:  Young women.  People who have a history of clinical depression or bipolar disorder.  People who live far [place] or far [place] of the equator. What are the signs or symptoms? Symptoms of this condition include:  Depressed mood, such as:  Feeling sad, down, blue, or teary.  Having crying spells.  Irritability.  Trouble sleeping or sleeping more than usual.  Loss of interest in activities that you usually enjoy.  Feelings of guilt or worthlessness.  Restlessness or loss of energy.  Difficulty concentrating, remembering, or making decisions.  Significant change in appetite or weight.  Recurrent wishes for death, recurrent thoughts of self-harm, or an attempt at suicide. How is this diagnosed? Diagnosis of this condition is usually made through an assessment by your health care provider. You will be asked about your moods, thoughts, and behaviors. You will also be asked about your medical history, any major life changes, medicines, and substance use. A physical exam and lab work may be done to make sure there is no other cause for your depression. You may be referred to a mental health specialist for further evaluation. How is this treated? Treatment for this condition may include:  Light therapy. Light therapy involves sitting in front of a light source for 15-30 minutes every day. It is thought to work by increasing the duration of  daylight that is sensed by the brain.  Antidepressant medicine.  Cognitive behavioral therapy (CBT).CBT is a form of talk therapy that helps to identify and change negative thoughts that are associated with SAD. Follow these instructions at home:  Take over-the-counter and prescription medicines only as told by your healthcare provider. This is important.  Check with your health care provider before you start taking any new prescriptions, over-the-counter medicines, herbs, or supplements.  Keep all follow-up visits as told by your health care provider.This is important.  Maintain a healthy lifestyle.  Eat healthy foods.  Get plenty of sleep.  Exercise regularly.  Do not drink alcohol.  Do not use recreational drugs.  Make your home and work environment as sunny or bright as possible. Open blinds and spend as much time as possible outside. Contact a health care provider if:  Your medicines do not seem to be helping.  Your symptoms do not improve or they get worse.  You have trouble taking care of yourself.  You experience side effects of medicines, such as changes in sleep patterns, dizziness, drowsiness, weight gain, restlessness, movement changes, or tremors. Get help right away if:  You have serious thoughts about hurting yourself or others.  You experience serious side effects of medicine, such as:  Swelling of your face, lips, tongue, or throat.  Fever, confusion, muscle spasms, or seizures. This information is not intended to replace advice given to you by your health care provider. Make sure you discuss any questions you have with your health care provider. Document Released: 04/29/2001 Document Revised: 01/10/2016 Document  Reviewed: 08/08/2014 Elsevier Interactive Patient Education  2017 [employer]

## 2016-11-11 NOTE — Progress Notes (Signed)
[name] Female    DOB: February 20, 1933   83 y.o.   MRN: [id number] Visit Date: 11/11/2016  Today's Provider: Margaretann Loveless, PA-C   No chief complaint on file.  Subjective:    HPI  Hypertension, follow-up:  BP Readings from Last 3 Encounters:  11/11/16 140/70  05/19/16 138/70  12/06/15 126/60    She was last seen for hypertension 5 months ago.  BP at that visit was 138/70. Management since that visit includes none. She reports excellent compliance with treatment. She is not having side effects.  She is not exercising. She is adherent to low salt diet.   She is experiencing none.  Patient denies chest pain, chest pressure/discomfort, fatigue, irregular heart beat, lower extremity edema, near-syncope and palpitations.   Cardiovascular risk factors include advanced age (older than 9 for men, 92 for women), dyslipidemia and hypertension.     Weight trend: stable Wt Readings from Last 3 Encounters:  11/11/16 170 lb 12.8 oz (77.5 kg)  05/19/16 172 lb 3.2 oz (78.1 kg)  12/06/15 172 lb (78 kg)    Current diet: in general, a "healthy" diet    ------------------------------------------------------------------------  Lipid/Cholesterol, Follow-up:   Last seen for this 5 months ago.  Management changes since that visit include none. . Last Lipid Panel:    Component Value Date/Time   CHOL 182 10/15/2016 0750   TRIG 147 10/15/2016 0750   HDL 51 10/15/2016 0750   CHOLHDL 3.6 10/15/2016 0750   LDLCALC 102 (H) 10/15/2016 0750    Risk factors for vascular disease include hypercholesterolemia and hypertension  She reports excellent compliance with treatment. She is not having side effects.  Current symptoms include none and have been stable. -------------------------------------------------------------------     Allergies  Allergen Reactions  . Albumen, Egg   . Eggs Or Egg-Derived Products   . Levaquin [Levofloxacin]   . Milk-Related  Compounds   . Penicillins   . Shellfish Allergy   . Sulfa Antibiotics   . Wheat Bran      Current Outpatient Prescriptions:  .  Acetaminophen (EXTRA STRENGTH ACETAMINOPHEN) 500 MG coapsule, Take 500 mg by mouth as needed for fever., Disp: , Rfl:  .  ALPRAZolam (XANAX) 0.25 MG tablet, Take 1 tablet (0.25 mg total) by mouth at bedtime as needed., Disp: 30 tablet, Rfl: 5 .  amLODipine (NORVASC) 2.5 MG tablet, TAKE ONE TABLET EVERY DAY, Disp: 90 tablet, Rfl: 1 .  aspirin 81 MG tablet, Take 81 mg by mouth daily., Disp: , Rfl:  .  cholecalciferol (VITAMIN D) 1000 UNITS tablet, Take 2,000 Units by mouth every other day. , Disp: , Rfl:  .  Cyanocobalamin (VITAMIN B 12 PO), Take 1,000 mg by mouth every other day. , Disp: , Rfl:  .  meloxicam (MOBIC) 7.5 MG tablet, TAKE ONE TABLET DAILT AS NEEDED FOR PAIN, Disp: 60 tablet, Rfl: 11 .  triamterene-hydrochlorothiazide (MAXZIDE) 75-50 MG tablet, TAKE ONE TABLET EVERY DAY, Disp: 90 tablet, Rfl: 1  Review of Systems  Constitutional: Negative for activity change, appetite change and fatigue.  Respiratory: Negative for cough, chest tightness and shortness of breath.   Cardiovascular: Negative for chest pain, palpitations and leg swelling.  Gastrointestinal: Negative for abdominal pain.  Neurological: Negative for dizziness and headaches.  Psychiatric/Behavioral: Positive for sleep disturbance (occasionally). Negative for agitation, decreased concentration, dysphoric mood, self-injury and suicidal ideas. The patient is not nervous/anxious.     Social History  Substance Use Topics  .  Smoking status: Never Smoker  . Smokeless tobacco: Never Used  . Alcohol use No   Objective:   BP 140/70 (BP Location: Right Arm, Patient Position: Sitting, Cuff Size: Normal)   Pulse 79   Temp 98.2 F (36.8 C) (Oral)   Resp 16   Wt 170 lb 12.8 oz (77.5 kg)   SpO2 98%   BMI 32.27 kg/m    Physical Exam  Constitutional: She appears well-developed and  well-nourished. No distress.  Neck: Normal range of motion. Neck supple. No JVD present. No tracheal deviation present. No thyromegaly present.  Cardiovascular: Normal rate, regular rhythm and intact distal pulses.  Exam reveals no gallop and no friction rub.   Murmur heard.  Systolic murmur is present with a grade of 2/6  Pulmonary/Chest: Effort normal and breath sounds normal. No respiratory distress. She has no wheezes. She has no rales.  Musculoskeletal: She exhibits no edema.  Lymphadenopathy:    She has no cervical adenopathy.  Skin: She is not diaphoretic.  Psychiatric: She has a normal mood and affect. Her behavior is normal. Judgment and thought content normal.  Vitals reviewed.  Depression screen [facility] 11/11/2016  Decreased Interest 0  Down, Depressed, Hopeless 0  PHQ - 2 Score 0  Altered sleeping 1  Tired, decreased energy 1  Change in appetite 1  Feeling bad or failure about yourself  0  Trouble concentrating 0  Moving slowly or fidgety/restless 0  Suicidal thoughts 0  PHQ-9 Score 3       Assessment & Plan:     1. Essential hypertension Stable. Continue Amlodipine 5mg  and Maxzide 75-50mg . Labs were stable. Mild renal function bump with creatinine of 1.12 and GFR of 46. Stable from labs last year. Will continue to monitor and recheck in 6 months.   2. Seasonal affective disorder (HCC) Had flares this winter. Would use alprazolam 0.25mg  that helps. Will continue alprazolam as needed.   3. Pure hypercholesterolemia Cholesterol is normal with total cholesterol 182, HDL 51, LDL 102. She is due for her yearly f/u with Dr. Mariah MillingGollan for systolic heart murmur.    4. PAD (peripheral artery disease) (HCC) Noted on ABI from health screen done by her insurance company. She still denies any intermittent claudication. R ABI 0.70, L ABI 0.95. Pulses still palpable. Advised patient to make Dr. Mariah MillingGollan aware and we will recheck in one year in October.  5. Anxiety Stable. Diagnosis  pulled for medication refill. Continue current medical treatment plan. - ALPRAZolam (XANAX) 0.25 MG tablet; Take 1 tablet (0.25 mg total) by mouth at bedtime as needed.  Dispense: 30 tablet; Refill: 5  6. Breast cancer screening There is no family history of breast cancer. She does perform regular self breast exams. Mammogram was ordered as below. [facility] was given to patient so she may schedule her mammogram at her convenience. - MM Digital Screening; Future       Margaretann LovelessJennifer M Adelaido Nicklaus, PA-C  [facility]

## 2016-11-14 ENCOUNTER — Telehealth: Payer: Self-pay | Admitting: Family Medicine

## 2016-12-09 ENCOUNTER — Ambulatory Visit (INDEPENDENT_AMBULATORY_CARE_PROVIDER_SITE_OTHER): Payer: Medicare Other | Admitting: Physician Assistant

## 2016-12-09 ENCOUNTER — Encounter: Payer: Self-pay | Admitting: Physician Assistant

## 2016-12-09 VITALS — BP 132/70 | HR 80 | Temp 99.6°F | Resp 16 | Wt 170.0 lb

## 2016-12-09 DIAGNOSIS — N3000 Acute cystitis without hematuria: Secondary | ICD-10-CM | POA: Diagnosis not present

## 2016-12-09 DIAGNOSIS — R350 Frequency of micturition: Secondary | ICD-10-CM

## 2016-12-09 LAB — POCT URINALYSIS DIPSTICK
BILIRUBIN UA: NEGATIVE
GLUCOSE UA: NEGATIVE
KETONES UA: NEGATIVE
NITRITE UA: NEGATIVE
PROTEIN UA: NEGATIVE
Spec Grav, UA: 1.01 (ref 1.010–1.025)
Urobilinogen, UA: 0.2 E.U./dL
pH, UA: 6.5 (ref 5.0–8.0)

## 2016-12-09 MED ORDER — NITROFURANTOIN MONOHYD MACRO 100 MG PO CAPS: 100.0000 mg | ORAL_CAPSULE | Freq: Two times a day (BID) | ORAL | 0 refills | Status: DC

## 2016-12-09 NOTE — Progress Notes (Signed)
[name] Female    DOB: [birth year]   83 y.o.   MRN: [id number] Visit Date: 12/09/2016  Today's Provider: Margaretann Loveless, PA-C   Chief Complaint  Patient presents with  . Abdominal Pain   Subjective:    Abdominal Pain  This is a new problem. The current episode started in the past 7 days (about 4 days ago). The onset quality is gradual. The problem occurs intermittently. The problem has been waxing and waning. The pain is located in the periumbilical region. The pain is mild (to moderate). The quality of the pain is cramping and a sensation of fullness. The abdominal pain does not radiate. Associated symptoms include constipation, diarrhea, frequency and myalgias. Pertinent negatives include no fever, hematochezia, hematuria, melena, nausea or vomiting. The pain is aggravated by certain positions. The pain is relieved by nothing. She has tried nothing for the symptoms. diverticulitis   Patient does mention that she has history of diverticulosis. Patient feels that her symptoms are similar to when she had diverticulitis.     Allergies  Allergen Reactions  . Albumen, Egg   . Clindamycin/Lincomycin   . Eggs Or Egg-Derived Products   . Flagyl [Metronidazole]   . Levaquin [Levofloxacin]   . Milk-Related Compounds   . Penicillins   . Shellfish Allergy   . Sulfa Antibiotics   . Wheat Bran      Current Outpatient Prescriptions:  .  Acetaminophen (EXTRA STRENGTH ACETAMINOPHEN) 500 MG coapsule, Take 500 mg by mouth as needed for fever., Disp: , Rfl:  .  ALPRAZolam (XANAX) 0.25 MG tablet, Take 1 tablet (0.25 mg total) by mouth at bedtime as needed., Disp: 30 tablet, Rfl: 5 .  amLODipine (NORVASC) 2.5 MG tablet, TAKE ONE TABLET EVERY DAY, Disp: 90 tablet, Rfl: 1 .  aspirin 81 MG tablet, Take 81 mg by mouth daily., Disp: , Rfl:  .  cholecalciferol (VITAMIN D) 1000 UNITS tablet, Take 2,000 Units by mouth every other day. , Disp: , Rfl:  .  Cyanocobalamin  (VITAMIN B 12 PO), Take 1,000 mg by mouth every other day. , Disp: , Rfl:  .  meloxicam (MOBIC) 7.5 MG tablet, TAKE ONE TABLET DAILT AS NEEDED FOR PAIN, Disp: 60 tablet, Rfl: 11 .  triamterene-hydrochlorothiazide (MAXZIDE) 75-50 MG tablet, TAKE ONE TABLET EVERY DAY, Disp: 90 tablet, Rfl: 1  Review of Systems  Constitutional: Negative for fever.  Gastrointestinal: Positive for abdominal pain, constipation and diarrhea. Negative for hematochezia, melena, nausea and vomiting.  Genitourinary: Positive for frequency. Negative for hematuria.  Musculoskeletal: Positive for myalgias.    Social History  Substance Use Topics  . Smoking status: Never Smoker  . Smokeless tobacco: Never Used  . Alcohol use No   Objective:   BP 132/70 (BP Location: Left Arm, Patient Position: Sitting, Cuff Size: Normal)   Pulse 80   Temp 99.6 F (37.6 C)   Resp 16   Wt 170 lb (77.1 kg)   SpO2 96%   BMI 32.12 kg/m  Vitals:   12/09/16 0937  BP: 132/70  Pulse: 80  Resp: 16  Temp: 99.6 F (37.6 C)  SpO2: 96%  Weight: 170 lb (77.1 kg)     Physical Exam  Constitutional: She is oriented to person, place, and time. She appears well-developed and well-nourished. No distress.  Cardiovascular: Normal rate and regular rhythm.  Exam reveals no gallop and no friction rub.   Murmur heard. Pulmonary/Chest: Effort normal and breath sounds normal.  No respiratory distress. She has no wheezes. She has no rales.  Abdominal: Soft. Normal appearance and bowel sounds are normal. She exhibits no distension and no mass. There is no hepatosplenomegaly. There is no tenderness. There is no rebound, no guarding and no CVA tenderness.  Neurological: She is alert and oriented to person, place, and time.  Skin: Skin is warm and dry. She is not diaphoretic.  Vitals reviewed.      Assessment & Plan:     1. Acute cystitis without hematuria Suspect UTI vs diverticulitis. No rebound or guarding on exam. Will treat empirically with  macrobid and send urine for culture. Will adjust treatment if needed pending C&S results. She is to also go to bland diet and call if symptoms worsen.  - nitrofurantoin, macrocrystal-monohydrate, (MACROBID) 100 MG capsule; Take 1 capsule (100 mg total) by mouth 2 (two) times daily.  Dispense: 14 capsule; Refill: 0  2. Urine frequency UA positive for pyuria and NH moderate hematuria.  - POCT Urinalysis Dipstick - Urine culture       Margaretann Loveless, PA-C  [facility]

## 2016-12-09 NOTE — Patient Instructions (Signed)

## 2016-12-11 LAB — URINE CULTURE

## 2016-12-31 ENCOUNTER — Telehealth: Payer: Self-pay | Admitting: Family Medicine

## 2016-12-31 DIAGNOSIS — G8929 Other chronic pain: Secondary | ICD-10-CM

## 2016-12-31 DIAGNOSIS — M25562 Pain in left knee: Secondary | ICD-10-CM

## 2016-12-31 DIAGNOSIS — M25561 Pain in right knee: Secondary | ICD-10-CM

## 2016-12-31 DIAGNOSIS — M6281 Muscle weakness (generalized): Secondary | ICD-10-CM

## 2016-12-31 NOTE — Telephone Encounter (Signed)
Pt needs arthoraz[facility] for [facility] at [facility] on [address]  She has an appt on the 22nd of May  Pt call back is [phone]  Thanks Barth Kirkseri

## 2016-12-31 NOTE — Telephone Encounter (Signed)
Do you have any idea what this about? I've never seen this patient.

## 2016-12-31 NOTE — Telephone Encounter (Signed)
PT order placed for generalized muscle weakness, gait instability and knee pain

## 2016-12-31 NOTE — Telephone Encounter (Signed)
Please advise 

## 2017-01-03 NOTE — Progress Notes (Signed)
Cardiology Office Note  Date:  01/05/2017   ID:  Alicia Mccann, DOB Feb 05, 1933, MRN [id number]  PCP:  Malva LimesFisher, Donald E, MD   Chief Complaint  Patient presents with  . other    12 month follow up. Meds reviewed by the pt. verbally. Pt. c/o not having any energy; contributing it to her arthritis but not sure.     HPI:  Alicia Mccann is a very pleasant 83 year old woman history of hypertension,  obesity,  osteoarthritis,  previously evaluated for murmur , aortic valve sclerosis Without significant stenosis  Hypertension 12/2008 Ex MV: EF 78%, no ischemia. She presents for routine follow-up of her hypertension and murmur   lost her son  likely secondary to acute MI.  Husband with severe heart disease, LVAD, died Now lives alone, still adjusting   Does not feel well, thinks it is from her arthritis  Weight down 6 pounds, not eating 4 meals as she does not like to cook for  1 person No regular exercise program but stays busy all of the time She denies having any shortness of breath or chest discomfort. She also walks her dog and has no symptoms.  Labs 09/2016, reviewed with her in detail  Cr 1.12 HCt 37 Total chol 182, LDL 102  EKG shows normal sinus rhythm with rate 79 beats per minute, no significant ST or T-wave abnormality   Other past medical history reviewed Echocardiogram May 2010 showed normal LV systolic function, mild MR and TR  Echocardiogram stress test May 2010 was essentially normal. She exercised for 3 minutes achieved 4.6 METs, peak heart rate 151 beats per minute. No EKG changes concerning for ischemia. Echocardiogram showed no ischemia  Followup note from cardiology April 2011 suggested she have fatigue from low-dose beta blocker. She was on lisinopril 20 mg at that time.  She does report having a family history. Mother was a smoker, had a stroke in her early 5260s. Father had MI in his late 2050s was not a smoker   PMH:   has a past medical history of  Allergic rhinitis; Anemia; Anxiety; Cellulitis; Cholelithiasis; Essential hypertension; History of stress test; Insomnia; Osteoarthritis; Tachycardia; and Valvular heart disease.  PSH:    Past Surgical History:  Procedure Laterality Date  . BACK SURGERY    . BUNIONECTOMY    . CHOLECYSTECTOMY  03/29/2007  . GALLBLADDER SURGERY    . HIP SURGERY     bilateral   . THUMB ARTHROSCOPY      Current Outpatient Prescriptions  Medication Sig Dispense Refill  . Acetaminophen (EXTRA STRENGTH ACETAMINOPHEN) 500 MG coapsule Take 500 mg by mouth as needed for fever.    . ALPRAZolam (XANAX) 0.25 MG tablet Take 1 tablet (0.25 mg total) by mouth at bedtime as needed. 30 tablet 5  . amLODipine (NORVASC) 2.5 MG tablet TAKE ONE TABLET EVERY DAY 90 tablet 1  . aspirin 81 MG tablet Take 81 mg by mouth daily.    . cholecalciferol (VITAMIN D) 1000 UNITS tablet Take 2,000 Units by mouth every other day.     . Cyanocobalamin (VITAMIN B 12 PO) Take 1,000 mg by mouth every other day.     . meloxicam (MOBIC) 7.5 MG tablet TAKE ONE TABLET DAILT AS NEEDED FOR PAIN 60 tablet 11  . triamterene-hydrochlorothiazide (MAXZIDE) 75-50 MG tablet TAKE ONE TABLET EVERY DAY 90 tablet 1   No current facility-administered medications for this visit.      Allergies:   Albumen, egg; Clindamycin/lincomycin; Eggs or egg-derived  products; Flagyl [metronidazole]; Levaquin [levofloxacin]; Milk-related compounds; Penicillins; Shellfish allergy; Sulfa antibiotics; and Wheat bran   Social History:  The patient  reports that she has never smoked. She has never used smokeless tobacco. She reports that she does not drink alcohol or use drugs.   Family History:   family history includes CAD in her father; Heart attack in her father; Heart attack (age of onset: 10) in her son; Heart disease in her father and mother; Hypertension in her mother and son; Stroke in her father and mother.    Review of Systems: Review of Systems  Constitutional:  Positive for malaise/fatigue.  Respiratory: Negative.   Cardiovascular: Negative.   Gastrointestinal: Negative.   Musculoskeletal: Negative.   Neurological: Negative.   Psychiatric/Behavioral: Negative.   All other systems reviewed and are negative.    PHYSICAL EXAM: VS:  BP 140/80 (BP Location: Left Arm, Patient Position: Sitting, Cuff Size: Normal)   Pulse 79   Ht 5' 1.5" (1.562 m)   Wt 167 lb (75.8 kg)   BMI 31.04 kg/m  , BMI Body mass index is 31.04 kg/m. GEN: Well nourished, well developed, in no acute distress  HEENT: normal  Neck: no JVD, carotid bruits, or masses Cardiac: RRR; no murmurs, rubs, or gallops,no edema  Respiratory:  clear to auscultation bilaterally, normal work of breathing GI: soft, nontender, nondistended, + BS MS: no deformity or atrophy  Skin: warm and dry, no rash Neuro:  Strength and sensation are intact Psych: euthymic mood, full affect    Recent Labs: 10/15/2016: ALT 18; BUN 26; Creatinine, Ser 1.12; Platelets 353; Potassium 4.0; Sodium 141    Lipid Panel Lab Results  Component Value Date   CHOL 182 10/15/2016   HDL 51 10/15/2016   LDLCALC 102 (H) 10/15/2016   TRIG 147 10/15/2016      Wt Readings from Last 3 Encounters:  01/05/17 167 lb (75.8 kg)  12/09/16 170 lb (77.1 kg)  11/11/16 170 lb 12.8 oz (77.5 kg)       ASSESSMENT AND PLAN:  Valvular heart disease - Plan: EKG 12-Lead Minimal aortic valve disease, sclerosis without stenosis  Mixed hyperlipidemia - Plan: EKG 12-Lead Decrease her cholesterol, currently on no medications No changes suggested. Continue to watch diet, exercise  Essential hypertension - Plan: EKG 12-Lead Blood pressure high end of normal range Weight is slowly trending downward. No medication changes made  Edema extremities - Plan: EKG 12-Lead No significant leg edema on today's visit  Malaise and fatigue Etiology of her general malaise and fatigue is unclear, she attributes it to arthritis    Total encounter time more than 25 minutes  Greater than 50% was spent in counseling and coordination of care with the patient   Disposition:   F/U  as needed 12 months   Orders Placed This Encounter  Procedures  . EKG 12-Lead     Signed, Dossie Arbour, M.D., Ph.D. 01/05/2017  [facility] [place], [place] [phone]

## 2017-01-05 ENCOUNTER — Encounter: Payer: Self-pay | Admitting: Cardiovascular Disease

## 2017-01-05 ENCOUNTER — Ambulatory Visit (INDEPENDENT_AMBULATORY_CARE_PROVIDER_SITE_OTHER): Payer: Medicare Other | Admitting: Cardiovascular Disease

## 2017-01-05 VITALS — BP 140/80 | HR 79 | Ht 61.5 in | Wt 167.0 lb

## 2017-01-05 DIAGNOSIS — E782 Mixed hyperlipidemia: Secondary | ICD-10-CM

## 2017-01-05 DIAGNOSIS — I10 Essential (primary) hypertension: Secondary | ICD-10-CM | POA: Diagnosis not present

## 2017-01-05 DIAGNOSIS — R5381 Other malaise: Secondary | ICD-10-CM | POA: Insufficient documentation

## 2017-01-05 DIAGNOSIS — R5383 Other fatigue: Secondary | ICD-10-CM | POA: Diagnosis not present

## 2017-01-05 DIAGNOSIS — R600 Localized edema: Secondary | ICD-10-CM | POA: Diagnosis not present

## 2017-01-05 DIAGNOSIS — I38 Endocarditis, valve unspecified: Secondary | ICD-10-CM | POA: Diagnosis not present

## 2017-01-05 NOTE — Patient Instructions (Signed)

## 2017-01-06 ENCOUNTER — Ambulatory Visit: Payer: Medicare Other | Attending: Family Medicine | Admitting: Physical Therapy

## 2017-01-06 ENCOUNTER — Encounter: Payer: Self-pay | Admitting: Physical Therapy

## 2017-01-06 DIAGNOSIS — M25561 Pain in right knee: Secondary | ICD-10-CM

## 2017-01-06 DIAGNOSIS — R262 Difficulty in walking, not elsewhere classified: Secondary | ICD-10-CM

## 2017-01-06 DIAGNOSIS — M6281 Muscle weakness (generalized): Secondary | ICD-10-CM

## 2017-01-07 NOTE — Therapy (Signed)
[facility] [address], [place], [address] Phone: [phone]   Fax:  [phone]  Physical Therapy Evaluation  Patient Details  Name: Alicia Mccann MRN: [id number] Date of Birth: August 14, 1933 Referring Provider: Joycelyn Man, NP, Mila Merry, MD  Encounter Date: 01/06/2017      PT End of Session - 01/06/17 1545    Visit Number 1   Number of Visits 12   Date for PT Re-Evaluation 02/17/17   Authorization Type 1   Authorization Time Period 10 (G-codes)   PT Start Time 1435   PT Stop Time 1533   PT Time Calculation (min) 58 min   Activity Tolerance Patient tolerated treatment well   Behavior During Therapy [facility] for tasks assessed/performed      Past Medical History:  Diagnosis Date  . Allergic rhinitis   . Anemia   . Anxiety   . Cellulitis   . Cholelithiasis   . Essential hypertension   . History of stress test    a. 12/2008 Ex MV: EF 78%, no ischemia.  . Insomnia   . Osteoarthritis   . Tachycardia   . Valvular heart disease    a. 12/2008 Echo: EF 65%, mild MR;  b. 2/6 SEM RUSB - insignificant murmur, prob Ao Sclerosis.    Past Surgical History:  Procedure Laterality Date  . BACK SURGERY    . BUNIONECTOMY    . CHOLECYSTECTOMY  03/29/2007  . GALLBLADDER SURGERY    . HIP SURGERY     bilateral   . THUMB ARTHROSCOPY      There were no vitals filed for this visit.       Subjective Assessment - 01/06/17 1447    Subjective this onset began May1, 2018 when she cleaned the patio and performed chores in the yard and home.    Pertinent History Increased knee and lumbar pain for over 10 years: she had an exaccerbation started 12/18/15 Pafter pulling weeds and gardening and then having taken care of her dog after surgery, she reports having Synvisc injections in August 2017 right knee without reults. She underwent physical therapy at that time and did well with good results and continued  with home program up until recent exacerbation of pain in knees following yard work 12/16/2016.   Limitations Sitting;Standing;Walking;House hold activities   How long can you sit comfortably? moderate discomfort sitting for 1 hour   How long can you stand comfortably? moderate discomfort standing for 1 hour   How long can you walk comfortably? difficulty with walking even 2 blocks   Diagnostic tests X-Ray: osteoarthrits in the R and L knee    Patient Stated Goals to get legs better and be able to perform daily tasks without as much pain   Currently in Pain? Yes   Pain Score 4   with medication   Pain Location Knee   Pain Orientation Right  lower back and left knee intermittently   Pain Descriptors / Indicators Aching;Other (Comment);Nagging  wears her out   Pain Type Chronic pain   Pain Onset More than a month ago   Pain Frequency Intermittent   Aggravating Factors  walking, standing   Pain Relieving Factors rest, medication   Effect of Pain on Daily Activities difficulty with all chores involving standing, walking and prlonged sitting            [facility] PT Assessment - 01/06/17 1440      Assessment   Medical Diagnosis  M15.9 Osteoarthritis of multiple joints, unspecified osteoarthritis type   Referring Provider Joycelyn ManJennifer Burnette, NP, Mila MerryFisher, Donald, MD   Onset Date/Surgical Date 12/16/16   Hand Dominance Right   Next MD Visit unknown   Prior Therapy yes     Precautions   Precautions None     Balance Screen   Has the patient fallen in the past 6 months No   Has the patient had a decrease in activity level because of a fear of falling?  No   Is the patient reluctant to leave their home because of a fear of falling?  No     Home Nurse, mental healthnvironment   Living Environment Private residence   Living Arrangements Alone   Available Help at Discharge Friend(s)   Type of Home House   Home Access Stairs to enter   Entrance Stairs-Number of Steps 3   Entrance Stairs-Rails Right   Home  Layout One level     Prior Function   Level of Independence Independent   Vocation Retired   GafferVocation Requirements N/A   Leisure Walking the dogs, going out to eat with friends     Cognition   Overall Cognitive Status Within Functional Limits for tasks assessed     Objective:  Gait: antalgic pattern Posture: increased valgus of right knee in standing and collapsing of knee with sit to stand Strength: right LE hip flexion 4-/5, knee extension 4-/5, flexion 4-/5 Outcome measure: LEFS 36/80 (80 = no self perceived disability)  Treatment: Modalities: Electrical stimulation: Russian stim. 10/10 cycle applied (2) electrodes to quadriceps/VMO LE with patient reclined with LE supported on pillow; pt. Performing quad sets/knee extension with each cycle; goal muscle re education High volt estim.clincial program for muscle spasms  (2) electrodes applied to right knee, intensity to tolerance with patient in reclined position with LE supported on pillow goal: pain, spasms  Therapeutic exercise: Instructed in home exercises hip adduction with ball and glute sets, hip abduction with resistive band  Patient response to treatment: improved motor control with quad setting, improved gait pattern with decreased knee pain at end of session. Verbalized good understanding of home exercise program         PT Education - 01/06/17 1544    Education provided Yes   Education Details POC, home exercises for hip adduction, abduction   Person(s) Educated Patient   Methods Explanation;Demonstration;Verbal cues;Handout   Comprehension Verbalized understanding;Returned demonstration;Verbal cues required             PT Long Term Goals - 01/06/17 1537      PT LONG TERM GOAL #1   Title Pt will score a 50/80 on the LEFS score by 02/16/17 to demonstrate significant improvement in lower extremity function and improved ability to ambulate and stand without increased pain.   Baseline LEFS 36/80   Status New      PT LONG TERM GOAL #2   Title Pt will be independent with exercise performance and progresion of exercises by 02/17/17 to allow for for self managment after discharge of therapy.   Baseline limited knowledge of appropriate exercise performance and progression.    Status New               Plan - 01/06/17 1540    Clinical Impression Statement Patient is an 83 year old female who presents with exacerbation of knee pain following increased activity working around her house. She presents with decreased strength in right LE hip, knee musculature and LEFS score of 36/80 indicating  moderate self perceived disability. She will benefit fro physical therapy intervention to address pain and decreased strength in order to achieve goals of prior level of function.    Rehab Potential Good   Clinical Impairments Affecting Rehab Potential (-): age, chronicity of condition; (+) highly motivated, acute exacerbation of pain   PT Frequency 2x / week   PT Duration 6 weeks   PT Treatment/Interventions Moist Heat;Electrical Stimulation;Cryotherapy;Ultrasound;Therapeutic activities;Therapeutic exercise;Manual techniques;Patient/family education;Neuromuscular re-education   PT Next Visit Plan pain control, therapeutic exercises   PT Home Exercise Plan seated hip adduction with ball, glute sets, hip abduction with resistive band, quad sets,   Consulted and Agree with Plan of Care Patient      Patient will benefit from skilled therapeutic intervention in order to improve the following deficits and impairments:  Decreased strength, Pain, Decreased activity tolerance, Difficulty walking, Impaired perceived functional ability  Visit Diagnosis: Right knee pain, unspecified chronicity - Plan: PT plan of care cert/re-cert  Muscle weakness (generalized) - Plan: PT plan of care cert/re-cert  Difficulty in walking, not elsewhere classified - Plan: PT plan of care cert/re-cert      G-Codes - 2017-01-08 1545     Functional Assessment Tool Used (Outpatient Only)  LEFS, pain scale, clinical judgment, strength deficits   Functional Limitation Mobility: Walking and moving around   Mobility: Walking and Moving Around Current Status (Z6109) At least 20 percent but less than 40 percent impaired, limited or restricted   Mobility: Walking and Moving Around Goal Status (U0454) At least 1 percent but less than 20 percent impaired, limited or restricted       Problem List Patient Active Problem List   Diagnosis Date Noted  . Malaise and fatigue 01/05/2017  . PAD (peripheral artery disease) (HCC) 05/19/2016  . Valvular heart disease   . Diverticulitis 05/25/2015  . Abdominal pain 05/24/2015  . Allergic rhinitis 04/30/2015  . Absolute anemia 04/30/2015  . Anxiety 04/30/2015  . Adult BMI 30+ 04/30/2015  . Carpal tunnel syndrome 04/30/2015  . Chronic LBP 04/30/2015  . Edema extremities 04/30/2015  . Cannot sleep 04/30/2015  . Arthritis sicca 04/30/2015  . Seasonal affective disorder (HCC) 04/30/2015  . Heart murmur 06/27/2013  . Essential hypertension 06/27/2013  . Obesity 06/27/2013  . Osteoarthritis 06/27/2013  . Mixed hyperlipidemia 06/27/2013  . Coxitis 07/08/2012  . History of repair of hip joint 07/08/2012  . Cholelithiasis without obstruction 03/22/2007    Beacher May PT 01/07/2017, 10:42 PM  [facility] [address], [place], [address] Phone: [phone]   Fax:  [phone]  Name: Alicia Mccann MRN: [id number] Date of Birth: 27-Jun-1933

## 2017-01-08 ENCOUNTER — Ambulatory Visit: Payer: Medicare Other | Admitting: Physical Therapy

## 2017-01-08 DIAGNOSIS — M25561 Pain in right knee: Secondary | ICD-10-CM

## 2017-01-08 DIAGNOSIS — M6281 Muscle weakness (generalized): Secondary | ICD-10-CM

## 2017-01-08 DIAGNOSIS — R262 Difficulty in walking, not elsewhere classified: Secondary | ICD-10-CM

## 2017-01-09 NOTE — Therapy (Signed)
[facility] PH[facility] AND [facility] MEDICINE [address] [place], [place], [address] Phone: [phone]   Fax:  [phone]  Physical Therapy Treatment  Patient Details  Name: Alicia OlszewskiJacqueline L Vernier MRN: [id number] Date of Birth: 11-Jun-1933 Referring Provider: Joycelyn ManJennifer Burnette, NP, Mila MerryFisher, Donald, MD  Encounter Date: 01/08/2017      PT End of Session - 01/08/17 1502    Visit Number 2   Number of Visits 12   Date for PT Re-Evaluation 02/17/17   Authorization Type 2   Authorization Time Period 10 (G-codes)   PT Start Time 1455   PT Stop Time 1530   PT Time Calculation (min) 35 min   Activity Tolerance Patient tolerated treatment well   Behavior During Therapy [facility] for tasks assessed/performed      Past Medical History:  Diagnosis Date  . Allergic rhinitis   . Anemia   . Anxiety   . Cellulitis   . Cholelithiasis   . Essential hypertension   . History of stress test    a. 12/2008 Ex MV: EF 78%, no ischemia.  . Insomnia   . Osteoarthritis   . Tachycardia   . Valvular heart disease    a. 12/2008 Echo: EF 65%, mild MR;  b. 2/6 SEM RUSB - insignificant murmur, prob Ao Sclerosis.    Past Surgical History:  Procedure Laterality Date  . BACK SURGERY    . BUNIONECTOMY    . CHOLECYSTECTOMY  03/29/2007  . GALLBLADDER SURGERY    . HIP SURGERY     bilateral   . THUMB ARTHROSCOPY      There were no vitals filed for this visit.      Subjective Assessment - 01/08/17 1500    Subjective Patient reports no pain in knee with medication and was able to clean house without difficulty.    Pertinent History Increased knee and lumbar pain for over 10 years: she had an exaccerbation started 12/18/15 Pafter pulling weeds and gardening and then having taken care of her dog after surgery, she reports having Synvisc injections in August 2017 right knee without reults. She underwent physical therapy at that time and did well with good results and continued with  home program up until recent exacerbation of pain in knees following yard work 12/16/2016.   Limitations Sitting;Standing;Walking;House hold activities   How long can you sit comfortably? moderate discomfort sitting for 1 hour   How long can you stand comfortably? moderate discomfort standing for 1 hour   How long can you walk comfortably? difficulty with walking even 2 blocks   Diagnostic tests X-Ray: osteoarthrits in the R and L knee    Patient Stated Goals to get legs better and be able to perform daily tasks without as much pain   Currently in Pain? Yes   Pain Score 2    Pain Location Knee   Pain Orientation Right   Pain Descriptors / Indicators Aching   Pain Type Chronic pain   Pain Onset More than a month ago   Pain Frequency Intermittent      Treatment: Modalities: Electrical stimulation x 20 min.: Russian stim. 10/10 cycle applied (2) electrodes to quadriceps/VMO LE with patient reclined with LE supported on pillow; pt. Performing quad sets/knee extension with each cycle; goal muscle re education High volt estim.clincial program for muscle spasms  (2) electrodes applied to right knee, intensity to tolerance with patient in reclined position with LE supported on pillow goal: pain, spasms  Therapeutic exercise: patient performed  exercises with instruction, VC and demonstration of therapist:  Sitting: Hip adduction with ball with glute sets x 15 reps Hip abduction with resistive band and assistance: clam x 15 reps Hip abduction with static hold at end range each LE x 10 reps with VC and demonstration  Patient response to treatment: Patent demonstrated good technique with exercises with minimal VC and assistance. No increased pain reported throughout session, continued with mild pain right knee with weight bearing at end of session.       PT Education - 01/08/17 1530    Education provided Yes   Education Details HEP, added hip abduciton with static hold in sitting   Person(s)  Educated Patient   Methods Explanation;Demonstration;Verbal cues   Comprehension Verbalized understanding;Returned demonstration;Verbal cues required             PT Long Term Goals - 01/06/17 1537      PT LONG TERM GOAL #1   Title Pt will score a 50/80 on the LEFS score by 02/16/17 to demonstrate significant improvement in lower extremity function and improved ability to ambulate and stand without increased pain.   Baseline LEFS 36/80   Status New     PT LONG TERM GOAL #2   Title Pt will be independent with exercise performance and progresion of exercises by 02/17/17 to allow for for self managment after discharge of therapy.   Baseline limited knowledge of appropriate exercise performance and progression.    Status New               Plan - 01/08/17 1537    Clinical Impression Statement Patient with improving pain levels in knees which allows her to do more at home. She is responding well to current physical therapy treatment and should conitnue to improve wiht additional physical therapy intervention.    Rehab Potential Good   Clinical Impairments Affecting Rehab Potential (-): age, chronicity of condition; (+) highly motivated, acute exacerbation of pain   PT Frequency 2x / week   PT Duration 6 weeks   PT Treatment/Interventions Moist Heat;Electrical Stimulation;Cryotherapy;Ultrasound;Therapeutic activities;Therapeutic exercise;Manual techniques;Patient/family education;Neuromuscular re-education   PT Next Visit Plan pain control, therapeutic exercises   PT Home Exercise Plan seated hip adduction with ball, glute sets, hip abduction with resistive band, quad sets,   Consulted and Agree with Plan of Care Patient      Patient will benefit from skilled therapeutic intervention in order to improve the following deficits and impairments:  Decreased strength, Pain, Decreased activity tolerance, Difficulty walking, Impaired perceived functional ability  Visit Diagnosis: Right  knee pain, unspecified chronicity  Muscle weakness (generalized)  Difficulty in walking, not elsewhere classified     Problem List Patient Active Problem List   Diagnosis Date Noted  . Malaise and fatigue 01/05/2017  . PAD (peripheral artery disease) (HCC) 05/19/2016  . Valvular heart disease   . Diverticulitis 05/25/2015  . Abdominal pain 05/24/2015  . Allergic rhinitis 04/30/2015  . Absolute anemia 04/30/2015  . Anxiety 04/30/2015  . Adult BMI 30+ 04/30/2015  . Carpal tunnel syndrome 04/30/2015  . Chronic LBP 04/30/2015  . Edema extremities 04/30/2015  . Cannot sleep 04/30/2015  . Arthritis sicca 04/30/2015  . Seasonal affective disorder (HCC) 04/30/2015  . Heart murmur 06/27/2013  . Essential hypertension 06/27/2013  . Obesity 06/27/2013  . Osteoarthritis 06/27/2013  . Mixed hyperlipidemia 06/27/2013  . Coxitis 07/08/2012  . History of repair of hip joint 07/08/2012  . Cholelithiasis without obstruction 03/22/2007    Beacher May PT  01/09/2017, 7:49 PM  [facility] PHYSICAL AND SPORTS MEDICINE [address] [place], [address] Phone: [phone]   Fax:  [phone]  Name: LYNNLEY DODDRIDGE MRN: [id number] Date of Birth: 08/19/1932

## 2017-01-13 ENCOUNTER — Ambulatory Visit: Payer: Medicare Other | Admitting: Physical Therapy

## 2017-01-15 ENCOUNTER — Encounter: Payer: Self-pay | Admitting: Physical Therapy

## 2017-01-15 ENCOUNTER — Ambulatory Visit: Payer: Medicare Other | Admitting: Physical Therapy

## 2017-01-15 DIAGNOSIS — M25561 Pain in right knee: Secondary | ICD-10-CM | POA: Diagnosis not present

## 2017-01-15 DIAGNOSIS — M6281 Muscle weakness (generalized): Secondary | ICD-10-CM

## 2017-01-15 DIAGNOSIS — R262 Difficulty in walking, not elsewhere classified: Secondary | ICD-10-CM

## 2017-01-15 NOTE — Therapy (Signed)
[facility] PHYS[facility] MEDICINE [address] [place], [place], [address] Phone: [phone]   Fax:  [phone]  Physical Therapy Treatment  Patient Details  Name: Alicia Mccann MRN: [id number] Date of Birth: 1932-11-23 Referring Provider: Joycelyn ManJennifer Burnette, NP, Mila MerryFisher, Donald, MD  Encounter Date: 01/15/2017      PT End of Session - 01/15/17 1515    Visit Number 3   Number of Visits 12   Date for PT Re-Evaluation 02/17/17   Authorization Type 3   Authorization Time Period 10 (G-codes)   PT Start Time 1440   PT Stop Time 1515   PT Time Calculation (min) 35 min   Activity Tolerance Patient tolerated treatment well   Behavior During Therapy [facility] for tasks assessed/performed      Past Medical History:  Diagnosis Date  . Allergic rhinitis   . Anemia   . Anxiety   . Cellulitis   . Cholelithiasis   . Essential hypertension   . History of stress test    a. 12/2008 Ex MV: EF 78%, no ischemia.  . Insomnia   . Osteoarthritis   . Tachycardia   . Valvular heart disease    a. 12/2008 Echo: EF 65%, mild MR;  b. 2/6 SEM RUSB - insignificant murmur, prob Ao Sclerosis.    Past Surgical History:  Procedure Laterality Date  . BACK SURGERY    . BUNIONECTOMY    . CHOLECYSTECTOMY  03/29/2007  . GALLBLADDER SURGERY    . HIP SURGERY     bilateral   . THUMB ARTHROSCOPY      There were no vitals filed for this visit.      Subjective Assessment - 01/15/17 1441    Subjective Patient reports having discomfort with right knee and had to stop taking meloxicam.    Pertinent History Increased knee and lumbar pain for over 10 years: she had an exaccerbation started 12/18/15 Pafter pulling weeds and gardening and then having taken care of her dog after surgery, she reports having Synvisc injections in August 2017 right knee without reults. She underwent physical therapy at that time and did well with good results and continued with home program  up until recent exacerbation of pain in knees following yard work 12/16/2016.   Limitations Sitting;Standing;Walking;House hold activities   How long can you sit comfortably? moderate discomfort sitting for 1 hour   How long can you stand comfortably? moderate discomfort standing for 1 hour   How long can you walk comfortably? difficulty with walking even 2 blocks   Diagnostic tests X-Ray: osteoarthrits in the R and L knee    Patient Stated Goals to get legs better and be able to perform daily tasks without as much pain   Currently in Pain? Yes   Pain Score 2    Pain Location Knee   Pain Orientation Right   Pain Descriptors / Indicators Aching   Pain Type Chronic pain   Pain Onset More than a month ago   Pain Frequency Intermittent     Objective:  Gait: antalgic gait with pain right knee Palpation: point tenderness along joint line right knee  Treatment: Modalities: Electrical stimulation x 20 min.: Russian stim. 10/10 cycle applied (2) electrodes to quadriceps/VMO LE with patient reclined with LE supported on pillow; pt. Performing quad sets/knee extension with each cycle; goal muscle re education Moist heat applied to right knee in conjunction with estim. No adverse reactions noted: goal: pain  Therapeutic exercise:  NUStep  performed independently following estim. For ROM/strength/endurance: 10 min. Unbilled time   Patient response to treatment: Patient with improved gait pattern following treament with estim and exercise on NuStep. She verbalized good understanding of home program for pain control and exercise.        PT Education - 01/15/17 1500    Education provided Yes   Education Details HEP reassessed for sitting, strengthening and pain control   Person(s) Educated Patient   Methods Explanation   Comprehension Verbalized understanding             PT Long Term Goals - 01/06/17 1537      PT LONG TERM GOAL #1   Title Pt will score a 50/80 on the LEFS score by  02/16/17 to demonstrate significant improvement in lower extremity function and improved ability to ambulate and stand without increased pain.   Baseline LEFS 36/80   Status New     PT LONG TERM GOAL #2   Title Pt will be independent with exercise performance and progresion of exercises by 02/17/17 to allow for for self managment after discharge of therapy.   Baseline limited knowledge of appropriate exercise performance and progression.    Status New               Plan - 01/15/17 1515    Clinical Impression Statement Patient continues with pain as primary limiting factor to improved function. she is having more night pain with right knee and is having evlauation by MD next week to determine further options for pain control. she demonstrated decreased  pain and improved abiltiy to ambulate at end of session and should continue to benefit from additional physical therapy intervention.    Rehab Potential Good   Clinical Impairments Affecting Rehab Potential (-): age, chronicity of condition; (+) highly motivated, acute exacerbation of pain   PT Frequency 2x / week   PT Duration 6 weeks   PT Treatment/Interventions Moist Heat;Electrical Stimulation;Cryotherapy;Ultrasound;Therapeutic activities;Therapeutic exercise;Manual techniques;Patient/family education;Neuromuscular re-education   PT Next Visit Plan pain control, therapeutic exercises   PT Home Exercise Plan seated hip adduction with ball, glute sets, hip abduction with resistive band, quad sets,      Patient will benefit from skilled therapeutic intervention in order to improve the following deficits and impairments:  Decreased strength, Pain, Decreased activity tolerance, Difficulty walking, Impaired perceived functional ability  Visit Diagnosis: Muscle weakness (generalized)  Right knee pain, unspecified chronicity  Difficulty in walking, not elsewhere classified     Problem List Patient Active Problem List   Diagnosis  Date Noted  . Malaise and fatigue 01/05/2017  . PAD (peripheral artery disease) (HCC) 05/19/2016  . Valvular heart disease   . Diverticulitis 05/25/2015  . Abdominal pain 05/24/2015  . Allergic rhinitis 04/30/2015  . Absolute anemia 04/30/2015  . Anxiety 04/30/2015  . Adult BMI 30+ 04/30/2015  . Carpal tunnel syndrome 04/30/2015  . Chronic LBP 04/30/2015  . Edema extremities 04/30/2015  . Cannot sleep 04/30/2015  . Arthritis sicca 04/30/2015  . Seasonal affective disorder (HCC) 04/30/2015  . Heart murmur 06/27/2013  . Essential hypertension 06/27/2013  . Obesity 06/27/2013  . Osteoarthritis 06/27/2013  . Mixed hyperlipidemia 06/27/2013  . Coxitis 07/08/2012  . History of repair of hip joint 07/08/2012  . Cholelithiasis without obstruction 03/22/2007    Beacher May PT 01/15/2017, 10:54 PM   [facility] PHYSICAL AND SPORTS MEDICINE [address] [place], [address] Phone: [phone]   Fax:  [phone]  Name: Alicia Mccann MRN: [id number] Date of Birth: 1933/08/14

## 2017-01-21 ENCOUNTER — Ambulatory Visit: Payer: Medicare Other | Admitting: Physical Therapy

## 2017-01-26 ENCOUNTER — Ambulatory Visit: Payer: Medicare Other | Admitting: Physical Therapy

## 2017-01-28 ENCOUNTER — Encounter: Payer: Medicare Other | Admitting: Physical Therapy

## 2017-02-02 ENCOUNTER — Encounter: Payer: Medicare Other | Admitting: Physical Therapy

## 2017-02-04 ENCOUNTER — Encounter: Payer: Medicare Other | Admitting: Physical Therapy

## 2017-02-09 ENCOUNTER — Encounter: Payer: Medicare Other | Admitting: Physical Therapy

## 2017-02-11 ENCOUNTER — Encounter: Payer: Medicare Other | Admitting: Physical Therapy

## 2017-02-16 ENCOUNTER — Encounter: Payer: Medicare Other | Admitting: Physical Therapy

## 2017-02-19 ENCOUNTER — Encounter: Payer: Medicare Other | Admitting: Physical Therapy

## 2017-03-03 DIAGNOSIS — I38 Endocarditis, valve unspecified: Secondary | ICD-10-CM | POA: Insufficient documentation

## 2017-03-04 ENCOUNTER — Other Ambulatory Visit: Payer: Self-pay | Admitting: Family Medicine

## 2017-03-04 DIAGNOSIS — I10 Essential (primary) hypertension: Secondary | ICD-10-CM

## 2017-03-05 ENCOUNTER — Telehealth: Payer: Self-pay | Admitting: Family Medicine

## 2017-03-05 NOTE — Telephone Encounter (Signed)
Ok to schedule.  Was informed may be out a bit for new pt appt.

## 2017-03-05 NOTE — Telephone Encounter (Signed)
Please advise, thanks.

## 2017-03-05 NOTE — Telephone Encounter (Signed)
Pt called and stated that she saw Dr. Erin SonsHarold Kernodle on Monday and stated that he could get her an appt with Dr. Lorin PicketScott. Please advise, thank you!  Call pt @ [phone]

## 2017-03-06 ENCOUNTER — Other Ambulatory Visit: Payer: Self-pay | Admitting: Unknown Physician Specialty

## 2017-03-06 DIAGNOSIS — M1711 Unilateral primary osteoarthritis, right knee: Secondary | ICD-10-CM

## 2017-03-06 NOTE — Telephone Encounter (Signed)
Pt scheduled 05/08/17.

## 2017-03-16 ENCOUNTER — Ambulatory Visit
Admission: RE | Admit: 2017-03-16 | Discharge: 2017-03-16 | Disposition: A | Discharge status: Home / Self Care | Payer: Medicare Other | Source: Ambulatory Visit | Attending: Unknown Physician Specialty | Admitting: Unknown Physician Specialty

## 2017-03-16 DIAGNOSIS — M1711 Unilateral primary osteoarthritis, right knee: Secondary | ICD-10-CM | POA: Insufficient documentation

## 2017-03-16 DIAGNOSIS — M23300 Other meniscus derangements, unspecified lateral meniscus, right knee: Secondary | ICD-10-CM | POA: Diagnosis not present

## 2017-03-16 DIAGNOSIS — M2410 Other articular cartilage disorders, unspecified site: Secondary | ICD-10-CM | POA: Insufficient documentation

## 2017-03-16 DIAGNOSIS — M2341 Loose body in knee, right knee: Secondary | ICD-10-CM | POA: Insufficient documentation

## 2017-03-16 DIAGNOSIS — M7121 Synovial cyst of popliteal space [Baker], right knee: Secondary | ICD-10-CM | POA: Diagnosis not present

## 2017-05-08 ENCOUNTER — Ambulatory Visit (INDEPENDENT_AMBULATORY_CARE_PROVIDER_SITE_OTHER): Payer: Medicare Other | Admitting: Internal Medicine

## 2017-05-08 ENCOUNTER — Encounter: Payer: Self-pay | Admitting: Internal Medicine

## 2017-05-08 VITALS — BP 144/80 | HR 82 | Temp 98.6°F | Resp 14 | Ht 62.0 in | Wt 169.4 lb

## 2017-05-08 DIAGNOSIS — Z6830 Body mass index (BMI) 30.0-30.9, adult: Secondary | ICD-10-CM | POA: Diagnosis not present

## 2017-05-08 DIAGNOSIS — I38 Endocarditis, valve unspecified: Secondary | ICD-10-CM | POA: Diagnosis not present

## 2017-05-08 DIAGNOSIS — M159 Polyosteoarthritis, unspecified: Secondary | ICD-10-CM

## 2017-05-08 DIAGNOSIS — E782 Mixed hyperlipidemia: Secondary | ICD-10-CM

## 2017-05-08 DIAGNOSIS — Z23 Encounter for immunization: Secondary | ICD-10-CM | POA: Diagnosis not present

## 2017-05-08 DIAGNOSIS — Z1231 Encounter for screening mammogram for malignant neoplasm of breast: Secondary | ICD-10-CM | POA: Diagnosis not present

## 2017-05-08 DIAGNOSIS — Z1239 Encounter for other screening for malignant neoplasm of breast: Secondary | ICD-10-CM

## 2017-05-08 DIAGNOSIS — I10 Essential (primary) hypertension: Secondary | ICD-10-CM | POA: Diagnosis not present

## 2017-05-08 NOTE — Progress Notes (Signed)
Patient ID: Alicia Mccann, female   DOB: May 10, 1933, 83 y.o.   MRN: [id number]   Subjective:    Patient ID: Alicia Mccann, female    DOB: 1933-05-11, 83 y.o.   MRN: [id number]  HPI  Patient here to establish care.  Was seeing Dr Elease Hashimoto.  Has issues with arthritis.  Sees Dr Gavin Potters.  S/p injection of knee.  Also has seen Dr Sherin Quarry for her knees.  She has also seen dr Mariah Milling.  Found to have murmur - aortic valve sclerosis. Tries to stay active.  No chest pain.  No sob.  No acid reflux.  No nausea or vomiting.  Bowels moving.  Previously had flare of diverticulitis.  no abdominal pain now.  States blood pressure is normally under good control.  On amlodipine and triam/hctz.      Past Medical History:  Diagnosis Date  . Allergic rhinitis   . Anemia   . Anxiety   . Cellulitis   . Cholelithiasis   . Essential hypertension   . History of stress test    a. 12/2008 Ex MV: EF 78%, no ischemia.  . Insomnia   . Osteoarthritis   . Tachycardia   . Valvular heart disease    a. 12/2008 Echo: EF 65%, mild MR;  b. 2/6 SEM RUSB - insignificant murmur, prob Ao Sclerosis.   Past Surgical History:  Procedure Laterality Date  . BACK SURGERY    . BUNIONECTOMY    . CHOLECYSTECTOMY  03/29/2007  . GALLBLADDER SURGERY    . HIP SURGERY     bilateral   . THUMB ARTHROSCOPY     Family History  Problem Relation Age of Onset  . Heart attack Father   . Stroke Father   . Heart disease Father   . CAD Father   . Heart attack Son 32       MI  . Hypertension Son   . Hypertension Mother   . Heart disease Mother   . Stroke Mother   . Breast cancer Neg Hx    Social History   Social History  . Marital status: Widowed    Spouse name: N/A  . Number of children: N/A  . Years of education: N/A   Social History Main Topics  . Smoking status: Never Smoker  . Smokeless tobacco: Never Used  . Alcohol use No  . Drug use: No  . Sexual activity: Not Asked   Other Topics Concern  . None    Social History Narrative  . None    Outpatient Encounter Prescriptions as of 05/08/2017  Medication Sig  . Acetaminophen (EXTRA STRENGTH ACETAMINOPHEN) 500 MG coapsule Take 500 mg by mouth as needed for fever.  . ALPRAZolam (XANAX) 0.25 MG tablet Take 1 tablet (0.25 mg total) by mouth at bedtime as needed.  Marland Kitchen amLODipine (NORVASC) 2.5 MG tablet TAKE 1 TABLET BY MOUTH DAILY  . aspirin 81 MG tablet Take 81 mg by mouth daily.  Marland Kitchen triamterene-hydrochlorothiazide (MAXZIDE) 75-50 MG tablet TAKE 1 TABLET BY MOUTH DAILY  . cholecalciferol (VITAMIN D) 1000 UNITS tablet Take 2,000 Units by mouth every other day.   . Cyanocobalamin (VITAMIN B 12 PO) Take 1,000 mg by mouth every other day.   . [DISCONTINUED] meloxicam (MOBIC) 7.5 MG tablet TAKE ONE TABLET DAILT AS NEEDED FOR PAIN   No facility-administered encounter medications on file as of 05/08/2017.     Review of Systems  Constitutional: Negative for appetite change and unexpected weight change.  HENT:  Negative for congestion and sinus pressure.   Respiratory: Negative for cough, chest tightness and shortness of breath.   Cardiovascular: Negative for chest pain, palpitations and leg swelling.  Gastrointestinal: Negative for abdominal pain, diarrhea, nausea and vomiting.  Genitourinary: Negative for difficulty urinating and dysuria.  Musculoskeletal: Negative for myalgias.       Knee doing better s/p injection.    Skin: Negative for color change and rash.  Neurological: Negative for dizziness, light-headedness and headaches.  Psychiatric/Behavioral: Negative for agitation and dysphoric mood.       Objective:    Physical Exam  Constitutional: She appears well-developed and well-nourished. No distress.  HENT:  Nose: Nose normal.  Mouth/Throat: Oropharynx is clear and moist.  Neck: Neck supple. No thyromegaly present.  Cardiovascular: Normal rate and regular rhythm.   Pulmonary/Chest: Breath sounds normal. No respiratory distress. She  has no wheezes.  Abdominal: Soft. Bowel sounds are normal. There is no tenderness.  Musculoskeletal: She exhibits no edema or tenderness.  Lymphadenopathy:    She has no cervical adenopathy.  Skin: No rash noted. No erythema.  Psychiatric: Her behavior is normal.    BP (!) 144/80 (BP Location: Left Arm, Patient Position: Sitting, Cuff Size: Normal)   Pulse 82   Temp 98.6 F (37 C) (Oral)   Resp 14   Ht  (1.575 m)   Wt 169 lb 6.4 oz (76.8 kg)   SpO2 98%   BMI 30.98 kg/m  Wt Readings from Last 3 Encounters:  05/08/17 169 lb 6.4 oz (76.8 kg)  01/05/17 167 lb (75.8 kg)  12/09/16 170 lb (77.1 kg)     Lab Results  Component Value Date   WBC 10.2 10/15/2016   HGB 12.6 10/15/2016   HCT 37.8 10/15/2016   PLT 353 10/15/2016   GLUCOSE 88 10/15/2016   CHOL 182 10/15/2016   TRIG 147 10/15/2016   HDL 51 10/15/2016   LDLCALC 102 (H) 10/15/2016   ALT 18 10/15/2016   AST 14 10/15/2016   NA 141 10/15/2016   K 4.0 10/15/2016   CL 101 10/15/2016   CREATININE 1.12 (H) 10/15/2016   BUN 26 10/15/2016   CO2 24 10/15/2016   TSH 2.750 06/19/2015   INR 0.9 01/16/2009    Mr Knee Right Wo Contrast  Result Date: 03/16/2017 CLINICAL DATA:  Right knee osteoarthritis. EXAM: MRI OF THE RIGHT KNEE WITHOUT CONTRAST TECHNIQUE: Multiplanar, multisequence MR imaging of the knee was performed. No intravenous contrast was administered. COMPARISON:  None. FINDINGS: MENISCI Medial meniscus:  Intact. Lateral meniscus: Complete maceration of the entire lateral meniscus with residual meniscal tissue extruded into the lateral gutter. LIGAMENTS Cruciates:  Intact ACL and PCL. Collaterals: Medial collateral ligament is intact. Lateral collateral ligament complex is intact. CARTILAGE Patellofemoral: Diffuse high-grade full-thickness cartilage loss over the entire patella. Partial-thickness cartilage loss over the trochlea. Large patellar osteophytes. Medial: Superficial irregularity and high-grade  partial-thickness cartilage loss over the medial femoral condyle. Small marginal osteophytes. Lateral: Full-thickness cartilage loss over the entire weight-bearing lateral femoral condyle and lateral tibial plateau with underlying degenerative marrow edema. Large marginal osteophytes. Joint: Moderate joint effusion. Increased edema within Hoffa's fat pad. Popliteal Fossa: Moderate Baker cyst containing multiple intra-articular loose bodies. Intact popliteus tendon. Extensor Mechanism:  Intact quadriceps tendon and patellar tendon. Bones: No focal marrow signal abnormality. No fracture or dislocation. Other: None.Bold IMPRESSION: 1. Severe degenerative changes within the lateral and patellofemoral compartments with large areas of full-thickness cartilage loss as described above. 2. Complete maceration of  the entire lateral meniscus, with only minimal residual meniscal tissue extruded into the lateral gutter. 3. Moderate Baker cyst containing multiple loose bodies. Electronically Signed   By: Obie Dredge M.D.   On: 03/16/2017 14:49       Assessment & Plan:   Problem List Items Addressed This Visit    BMI 30.0-30.9,adult    Diet and exercise.  Follow.        Essential hypertension    Blood pressure elevated today.  Have her spot check her pressure.  Get her back in soon to reassess.  Hold on making changes in her medication.  Follow.        Relevant Orders   Basic metabolic panel   Mixed hyperlipidemia    Low cholesterol diet and exercise.  Follow lipid panel.        Relevant Orders   Lipid panel   Hepatic function panel   Osteoarthritis    Followed by Dr Gavin Potters.        Valvular heart disease    Had echo.  Aortic valve sclerosis.  Follow.        Other Visit Diagnoses    Breast cancer screening    -  Primary   Relevant Orders   MM DIGITAL SCREENING BILATERAL   Encounter for immunization       Relevant Orders   Flu vaccine HIGH DOSE PF (Completed)   MM DIGITAL SCREENING  BILATERAL       [place], MD

## 2017-05-10 ENCOUNTER — Encounter: Payer: Self-pay | Admitting: Internal Medicine

## 2017-05-10 NOTE — Assessment & Plan Note (Signed)
Diet and exercise.  Follow.  

## 2017-05-10 NOTE — Assessment & Plan Note (Signed)
Low cholesterol diet and exercise.  Follow lipid panel.   

## 2017-05-10 NOTE — Assessment & Plan Note (Signed)
Had echo.  Aortic valve sclerosis.  Follow.

## 2017-05-10 NOTE — Assessment & Plan Note (Signed)
Blood pressure elevated today.  Have her spot check her pressure.  Get her back in soon to reassess.  Hold on making changes in her medication.  Follow.

## 2017-05-10 NOTE — Assessment & Plan Note (Signed)
Followed by Dr Kernodle.   

## 2017-05-26 ENCOUNTER — Other Ambulatory Visit (INDEPENDENT_AMBULATORY_CARE_PROVIDER_SITE_OTHER): Payer: Medicare Other

## 2017-05-26 DIAGNOSIS — E782 Mixed hyperlipidemia: Secondary | ICD-10-CM | POA: Diagnosis not present

## 2017-05-26 DIAGNOSIS — I10 Essential (primary) hypertension: Secondary | ICD-10-CM

## 2017-05-26 LAB — LIPID PANEL
CHOLESTEROL: 179 mg/dL (ref 0–200)
HDL: 41.5 mg/dL (ref >39.00)
LDL CALC: 105 mg/dL — AB (ref 0–99)
NonHDL: 137.64
Total CHOL/HDL Ratio: 4
Triglycerides: 161 mg/dL — ABNORMAL HIGH (ref 0.0–149.0)
VLDL: 32.2 mg/dL (ref 0.0–40.0)

## 2017-05-26 LAB — HEPATIC FUNCTION PANEL
ALBUMIN: 4.2 g/dL (ref 3.5–5.2)
ALT: 16 U/L (ref 0–35)
AST: 12 U/L (ref 0–37)
Alkaline Phosphatase: 61 U/L (ref 39–117)
BILIRUBIN TOTAL: 0.6 mg/dL (ref 0.2–1.2)
Bilirubin, Direct: 0.1 mg/dL (ref 0.0–0.3)
TOTAL PROTEIN: 6.6 g/dL (ref 6.0–8.3)

## 2017-05-26 LAB — BASIC METABOLIC PANEL
BUN: 36 mg/dL — ABNORMAL HIGH (ref 6–23)
CALCIUM: 9.8 mg/dL (ref 8.4–10.5)
CO2: 28 mEq/L (ref 19–32)
Chloride: 104 mEq/L (ref 96–112)
Creatinine, Ser: 1.28 mg/dL — ABNORMAL HIGH (ref 0.40–1.20)
GFR: 42.43 mL/min — AB (ref >60.00)
GLUCOSE: 97 mg/dL (ref 70–99)
POTASSIUM: 4.1 meq/L (ref 3.5–5.1)
SODIUM: 139 meq/L (ref 135–145)

## 2017-05-27 ENCOUNTER — Other Ambulatory Visit: Payer: Self-pay | Admitting: Internal Medicine

## 2017-05-27 DIAGNOSIS — R7989 Other specified abnormal findings of blood chemistry: Secondary | ICD-10-CM

## 2017-05-27 MED ORDER — ROSUVASTATIN CALCIUM 10 MG PO TABS: 10.0000 mg | ORAL_TABLET | Freq: Every day | ORAL | 0 refills | Status: DC

## 2017-05-27 NOTE — Addendum Note (Signed)
Addended by: Donnamarie Poag on: 05/27/2017 02:19 PM   Modules accepted: Orders

## 2017-05-27 NOTE — Progress Notes (Signed)
Order placed for f/u met b.  

## 2017-06-05 ENCOUNTER — Other Ambulatory Visit (INDEPENDENT_AMBULATORY_CARE_PROVIDER_SITE_OTHER): Payer: Medicare Other

## 2017-06-05 ENCOUNTER — Encounter: Payer: Self-pay | Admitting: Internal Medicine

## 2017-06-05 DIAGNOSIS — R7989 Other specified abnormal findings of blood chemistry: Secondary | ICD-10-CM | POA: Diagnosis not present

## 2017-06-05 LAB — BASIC METABOLIC PANEL
BUN: 34 mg/dL — AB (ref 6–23)
CHLORIDE: 100 meq/L (ref 96–112)
CO2: 28 meq/L (ref 19–32)
Calcium: 10.1 mg/dL (ref 8.4–10.5)
Creatinine, Ser: 1.36 mg/dL — ABNORMAL HIGH (ref 0.40–1.20)
GFR: 39.56 mL/min — ABNORMAL LOW (ref >60.00)
Glucose, Bld: 93 mg/dL (ref 70–99)
POTASSIUM: 4 meq/L (ref 3.5–5.1)
Sodium: 138 mEq/L (ref 135–145)

## 2017-06-08 ENCOUNTER — Other Ambulatory Visit: Payer: Self-pay | Admitting: Internal Medicine

## 2017-06-08 DIAGNOSIS — R7989 Other specified abnormal findings of blood chemistry: Secondary | ICD-10-CM

## 2017-06-08 NOTE — Progress Notes (Signed)
Order placed for f/u labs.  

## 2017-06-10 ENCOUNTER — Other Ambulatory Visit: Payer: Medicare Other

## 2017-06-16 ENCOUNTER — Other Ambulatory Visit: Payer: Medicare Other

## 2017-06-24 ENCOUNTER — Ambulatory Visit
Admission: RE | Admit: 2017-06-24 | Discharge: 2017-06-24 | Disposition: A | Discharge status: Home / Self Care | Payer: Medicare Other | Source: Ambulatory Visit | Attending: Internal Medicine | Admitting: Internal Medicine

## 2017-06-24 ENCOUNTER — Other Ambulatory Visit (INDEPENDENT_AMBULATORY_CARE_PROVIDER_SITE_OTHER): Payer: Medicare Other

## 2017-06-24 DIAGNOSIS — R7989 Other specified abnormal findings of blood chemistry: Secondary | ICD-10-CM | POA: Diagnosis not present

## 2017-06-24 DIAGNOSIS — Z1239 Encounter for other screening for malignant neoplasm of breast: Secondary | ICD-10-CM

## 2017-06-24 DIAGNOSIS — Z1231 Encounter for screening mammogram for malignant neoplasm of breast: Secondary | ICD-10-CM | POA: Diagnosis not present

## 2017-06-24 DIAGNOSIS — Z23 Encounter for immunization: Secondary | ICD-10-CM

## 2017-06-24 LAB — BASIC METABOLIC PANEL
BUN: 32 mg/dL — ABNORMAL HIGH (ref 6–23)
CHLORIDE: 103 meq/L (ref 96–112)
CO2: 27 meq/L (ref 19–32)
Calcium: 10.1 mg/dL (ref 8.4–10.5)
Creatinine, Ser: 1.21 mg/dL — ABNORMAL HIGH (ref 0.40–1.20)
GFR: 45.27 mL/min — ABNORMAL LOW (ref >60.00)
Glucose, Bld: 95 mg/dL (ref 70–99)
POTASSIUM: 3.7 meq/L (ref 3.5–5.1)
SODIUM: 140 meq/L (ref 135–145)

## 2017-06-24 LAB — URINALYSIS, ROUTINE W REFLEX MICROSCOPIC
Bilirubin Urine: NEGATIVE
HGB URINE DIPSTICK: NEGATIVE
Ketones, ur: NEGATIVE
Nitrite: NEGATIVE
RBC / HPF: NONE SEEN (ref 0–?)
Specific Gravity, Urine: 1.015 (ref 1.000–1.030)
TOTAL PROTEIN, URINE-UPE24: NEGATIVE
UROBILINOGEN UA: 0.2 (ref 0.0–1.0)
Urine Glucose: NEGATIVE
pH: 6 (ref 5.0–8.0)

## 2017-06-25 ENCOUNTER — Encounter: Payer: Self-pay | Admitting: Internal Medicine

## 2017-06-25 ENCOUNTER — Other Ambulatory Visit: Payer: Self-pay | Admitting: Internal Medicine

## 2017-06-25 DIAGNOSIS — N183 Chronic kidney disease, stage 3 unspecified: Secondary | ICD-10-CM | POA: Insufficient documentation

## 2017-06-25 DIAGNOSIS — Z0000 Encounter for general adult medical examination without abnormal findings: Secondary | ICD-10-CM | POA: Insufficient documentation

## 2017-06-25 NOTE — Progress Notes (Signed)
Order placed for renal ultrasound.  

## 2017-06-29 ENCOUNTER — Encounter: Payer: Self-pay | Admitting: Internal Medicine

## 2017-07-08 ENCOUNTER — Other Ambulatory Visit: Payer: Medicare Other

## 2017-07-28 ENCOUNTER — Ambulatory Visit: Payer: Medicare Other | Admitting: Internal Medicine

## 2017-08-19 ENCOUNTER — Ambulatory Visit
Admission: RE | Admit: 2017-08-19 | Discharge: 2017-08-19 | Disposition: A | Discharge status: Home / Self Care | Payer: Medicare Other | Source: Ambulatory Visit | Attending: Internal Medicine | Admitting: Internal Medicine

## 2017-08-19 DIAGNOSIS — N183 Chronic kidney disease, stage 3 unspecified: Secondary | ICD-10-CM

## 2017-09-01 ENCOUNTER — Telehealth: Payer: Self-pay

## 2017-09-01 ENCOUNTER — Other Ambulatory Visit: Payer: Self-pay | Admitting: Internal Medicine

## 2017-09-01 NOTE — Telephone Encounter (Signed)
Patient stated that symptoms are not consistent and she is going to monitor at home. If they start to worsen or become consistent she will call back. Patient stated that she is going to need a knee replacement, she has been in a lot of pain which she thinks could be part of the reason she is not sleeping well with a combination of the cold weather. Advised patient that we could hold message for work in if needed.

## 2017-09-01 NOTE — Telephone Encounter (Signed)
Copied from CRM [id number]. Topic: Appointment Scheduling - Scheduling Inquiry for Clinic >> Sep 01, 2017  9:52 AM Everardo PacificMoton, Kelly, [place] wrote: Reason for CRM: Patient called because she would like to be seen for not sleeping and frequent urination but Dr.Scott is booked out until her physical appointment that is on 10-19-2017. Patient doesn't want to see a different doctor in the office. I have added her to the wait list as well.  If someone could give her a call back about this at [phone]

## 2017-09-02 NOTE — Telephone Encounter (Signed)
Will hold for cancellation.

## 2017-09-02 NOTE — Telephone Encounter (Signed)
Does this mean she does not want us to schedule appt now?  If no, ok to close note.   If needs appt, will work her in.

## 2017-09-02 NOTE — Telephone Encounter (Signed)
Called patient and she felt that this was not urgent so please hold for cancellation.

## 2017-09-04 NOTE — Telephone Encounter (Signed)
Lm for pt to call back to schedule appt  There is an opening

## 2017-09-23 ENCOUNTER — Other Ambulatory Visit: Payer: Self-pay | Admitting: Physician Assistant

## 2017-09-23 DIAGNOSIS — I10 Essential (primary) hypertension: Secondary | ICD-10-CM

## 2017-09-29 ENCOUNTER — Telehealth: Payer: Self-pay | Admitting: Internal Medicine

## 2017-09-29 DIAGNOSIS — M545 Low back pain: Secondary | ICD-10-CM

## 2017-09-29 DIAGNOSIS — M25561 Pain in right knee: Secondary | ICD-10-CM

## 2017-09-29 NOTE — Telephone Encounter (Signed)
Please advise 

## 2017-09-29 NOTE — Telephone Encounter (Signed)
Copied from CRM [phone]. Topic: Quick Communication - See Telephone Encounter >> Sep 29, 2017  8:23 AM Cipriano BunkerLambe, Annette S wrote: CRM for notification. See Telephone encounter for:   She is needing to go to [facility] for her hip and back problems.   She is saying they need paperwork from doctor that it is ok to go there.Needs approval..  Kendal HymenBonnie is at the front desk and Hilda LiasMarie is therapist.   She is wanting to go next week for an appt.   She was not sure if need referral or just for doctor approval  09/29/17.

## 2017-09-30 NOTE — Telephone Encounter (Signed)
Kendal HymenBonnie from [facility] states they need the paperwork faxed over ASAP.  [phone] Fax [phone]

## 2017-09-30 NOTE — Telephone Encounter (Signed)
Left message for Bonnie to call back. 

## 2017-09-30 NOTE — Telephone Encounter (Signed)
I have only seen pt one time - an establish care appt 04/2017.  Per note review, she has been seeing ortho (Dr [facility], etc) for her knees.  She is mentioning in this message hip and back pain. I have not seen her for this.  I do not mind evaluating for need for referral.  We can schedule appt for evaluation, or she can have ortho (who she has been seeing regularly) to refer her.  Let me know if a problem.  See me before calling.

## 2017-09-30 NOTE — Telephone Encounter (Signed)
Patient needs order for out patient therapy for back pain.

## 2017-09-30 NOTE — Telephone Encounter (Signed)
Please advise 

## 2017-10-01 NOTE — Telephone Encounter (Signed)
Pt is calling stating [facility] still has not received a referral, she has an appt with them on 10/05/17 @ 1pm. Patient is not understanding she needs to be evaluated by Dr. Lorin PicketScott for this issue first, she states she has been seen many times. Please contact patient.   [phone]

## 2017-10-02 NOTE — Telephone Encounter (Signed)
Patient scheduled for evaluation on 10/19/17 will discuss at appointment.

## 2017-10-05 ENCOUNTER — Ambulatory Visit: Payer: Medicare Other | Admitting: Physical Therapy

## 2017-10-19 ENCOUNTER — Encounter: Payer: Self-pay | Admitting: Internal Medicine

## 2017-10-19 ENCOUNTER — Ambulatory Visit (INDEPENDENT_AMBULATORY_CARE_PROVIDER_SITE_OTHER): Payer: Medicare Other | Admitting: Internal Medicine

## 2017-10-19 VITALS — BP 138/78 | HR 73 | Resp 18 | Ht 62.0 in | Wt 168.8 lb

## 2017-10-19 DIAGNOSIS — Z6830 Body mass index (BMI) 30.0-30.9, adult: Secondary | ICD-10-CM | POA: Diagnosis not present

## 2017-10-19 DIAGNOSIS — N183 Chronic kidney disease, stage 3 unspecified: Secondary | ICD-10-CM

## 2017-10-19 DIAGNOSIS — E782 Mixed hyperlipidemia: Secondary | ICD-10-CM

## 2017-10-19 DIAGNOSIS — I10 Essential (primary) hypertension: Secondary | ICD-10-CM | POA: Diagnosis not present

## 2017-10-19 DIAGNOSIS — R5383 Other fatigue: Secondary | ICD-10-CM | POA: Diagnosis not present

## 2017-10-19 DIAGNOSIS — F338 Other recurrent depressive disorders: Secondary | ICD-10-CM | POA: Diagnosis not present

## 2017-10-19 DIAGNOSIS — R5381 Other malaise: Secondary | ICD-10-CM | POA: Diagnosis not present

## 2017-10-19 DIAGNOSIS — R42 Dizziness and giddiness: Secondary | ICD-10-CM | POA: Diagnosis not present

## 2017-10-19 DIAGNOSIS — I739 Peripheral vascular disease, unspecified: Secondary | ICD-10-CM | POA: Diagnosis not present

## 2017-10-19 LAB — TSH: TSH: 1.76 u[IU]/mL (ref 0.35–4.50)

## 2017-10-19 LAB — HEPATIC FUNCTION PANEL
ALK PHOS: 72 U/L (ref 39–117)
ALT: 24 U/L (ref 0–35)
AST: 19 U/L (ref 0–37)
Albumin: 4.2 g/dL (ref 3.5–5.2)
BILIRUBIN DIRECT: 0.1 mg/dL (ref 0.0–0.3)
BILIRUBIN TOTAL: 0.4 mg/dL (ref 0.2–1.2)
Total Protein: 7.1 g/dL (ref 6.0–8.3)

## 2017-10-19 LAB — BASIC METABOLIC PANEL
BUN: 27 mg/dL — AB (ref 6–23)
CALCIUM: 10.5 mg/dL (ref 8.4–10.5)
CHLORIDE: 104 meq/L (ref 96–112)
CO2: 27 meq/L (ref 19–32)
CREATININE: 1.12 mg/dL (ref 0.40–1.20)
GFR: 49.45 mL/min — ABNORMAL LOW (ref >60.00)
GLUCOSE: 91 mg/dL (ref 70–99)
Potassium: 3.9 mEq/L (ref 3.5–5.1)
Sodium: 140 mEq/L (ref 135–145)

## 2017-10-19 LAB — CBC WITH DIFFERENTIAL/PLATELET
BASOS ABS: 0.2 10*3/uL — AB (ref 0.0–0.1)
Basophils Relative: 2.1 % (ref 0.0–3.0)
EOS PCT: 5.7 % — AB (ref 0.0–5.0)
Eosinophils Absolute: 0.6 10*3/uL (ref 0.0–0.7)
HCT: 37.3 % (ref 36.0–46.0)
Hemoglobin: 12.3 g/dL (ref 12.0–15.0)
LYMPHS ABS: 2.3 10*3/uL (ref 0.7–4.0)
Lymphocytes Relative: 21.8 % (ref 12.0–46.0)
MCHC: 33 g/dL (ref 30.0–36.0)
MCV: 88.9 fl (ref 78.0–100.0)
MONO ABS: 0.7 10*3/uL (ref 0.1–1.0)
Monocytes Relative: 7 % (ref 3.0–12.0)
NEUTROS PCT: 63.4 % (ref 43.0–77.0)
Neutro Abs: 6.7 10*3/uL (ref 1.4–7.7)
Platelets: 315 10*3/uL (ref 150.0–400.0)
RBC: 4.19 Mil/uL (ref 3.87–5.11)
RDW: 13.3 % (ref 11.5–15.5)
WBC: 10.5 10*3/uL (ref 4.0–10.5)

## 2017-10-19 NOTE — Progress Notes (Signed)
Patient ID: Alicia Mccann, female   DOB: [birth year], 84 y.o.   MRN: [id number]   Subjective:    Patient ID: Alicia Mccann, female    DOB: 07-29-1933, 84 y.o.   MRN: [id number]  HPI  Patient here for her physical exam.  She has been having increased pain in her right knee.  Has seen ortho.  S/p injection.  Referred to physical therapy.  Has been under increased stress.  Stress with family issues.  Son diet of a heart attack a few years ago.  Husband is deceased.  Increased stress with her daughter at this time.  She also states she feels she has seasonal affective disorder.  Discussed all of this with her today.  She declines any suicidal ideations.  Discussed counseling.  Information given.  She takes an occasional 1/2 xanax.  Did not sleep well last night.  Noticing some light headedness this am.  No headache.  No significant dizziness.  No chest pain or tightness.  No acid reflux.  No abdominal pain.  Bowels moving.     Past Medical History:  Diagnosis Date  . Allergic rhinitis   . Anemia   . Anxiety   . Cellulitis   . Cholelithiasis   . Essential hypertension   . History of stress test    a. 12/2008 Ex MV: EF 78%, no ischemia.  . Insomnia   . Osteoarthritis   . Tachycardia   . Valvular heart disease    a. 12/2008 Echo: EF 65%, mild MR;  b. 2/6 SEM RUSB - insignificant murmur, prob Ao Sclerosis.   Past Surgical History:  Procedure Laterality Date  . BACK SURGERY    . BUNIONECTOMY    . CHOLECYSTECTOMY  03/29/2007  . GALLBLADDER SURGERY    . HIP SURGERY     bilateral   . THUMB ARTHROSCOPY     Family History  Problem Relation Age of Onset  . Heart attack Father   . Stroke Father   . Heart disease Father   . CAD Father   . Heart attack Son 98       MI  . Hypertension Son   . Hypertension Mother   . Heart disease Mother   . Stroke Mother   . Breast cancer Neg Hx    Social History   Socioeconomic History  . Marital status: Widowed    Spouse name: None  .  Number of children: None  . Years of education: None  . Highest education level: None  Social Needs  . Financial resource strain: None  . Food insecurity - worry: None  . Food insecurity - inability: None  . Transportation needs - medical: None  . Transportation needs - non-medical: None  Occupational History  . None  Tobacco Use  . Smoking status: Never Smoker  . Smokeless tobacco: Never Used  Substance and Sexual Activity  . Alcohol use: No  . Drug use: No  . Sexual activity: None  Other Topics Concern  . None  Social History Narrative  . None    Outpatient Encounter Medications as of 10/19/2017  Medication Sig  . Acetaminophen (EXTRA STRENGTH ACETAMINOPHEN) 500 MG coapsule Take 500 mg by mouth as needed for fever.  . ALPRAZolam (XANAX) 0.25 MG tablet Take 1 tablet (0.25 mg total) by mouth at bedtime as needed.  Marland Kitchen amLODipine (NORVASC) 2.5 MG tablet TAKE 1 TABLET BY MOUTH DAILY  . aspirin 81 MG tablet Take 81 mg by mouth daily.  . cholecalciferol (  VITAMIN D) 1000 UNITS tablet Take 2,000 Units by mouth every other day.   . Cyanocobalamin (VITAMIN B 12 PO) Take 1,000 mg by mouth every other day.   . rosuvastatin (CRESTOR) 10 MG tablet TAKE ONE TABLET BY MOUTH EVERY DAY  . triamterene-hydrochlorothiazide (MAXZIDE) 75-50 MG tablet TAKE 1 TABLET BY MOUTH DAILY   No facility-administered encounter medications on file as of 10/19/2017.     Review of Systems  Constitutional: Negative for appetite change and unexpected weight change.  HENT: Negative for congestion and sinus pressure.   Eyes: Negative for pain and visual disturbance.  Respiratory: Negative for cough, chest tightness and shortness of breath.   Cardiovascular: Negative for chest pain, palpitations and leg swelling.  Gastrointestinal: Negative for abdominal pain, diarrhea, nausea and vomiting.  Genitourinary: Negative for difficulty urinating and dysuria.  Musculoskeletal: Negative for joint swelling and myalgias.        Right knee pain as outlined.    Skin: Negative for color change and rash.  Neurological: Positive for light-headedness. Negative for dizziness and headaches.  Hematological: Negative for adenopathy. Does not bruise/bleed easily.  Psychiatric/Behavioral: Negative for agitation.       Increased stress as outlined.         Objective:     Blood pressure rechecked by me:  148/78  Physical Exam  Constitutional: She is oriented to person, place, and time. She appears well-developed and well-nourished. No distress.  HENT:  Nose: Nose normal.  Mouth/Throat: Oropharynx is clear and moist.  Eyes: Right eye exhibits no discharge. Left eye exhibits no discharge. No scleral icterus.  Neck: Neck supple. No thyromegaly present.  Cardiovascular: Normal rate and regular rhythm.  Pulmonary/Chest: Breath sounds normal. No accessory muscle usage. No tachypnea. No respiratory distress. She has no decreased breath sounds. She has no wheezes. She has no rhonchi. Right breast exhibits no inverted nipple, no mass, no nipple discharge and no tenderness (no axillary adenopathy). Left breast exhibits no inverted nipple, no mass, no nipple discharge and no tenderness (no axilarry adenopathy).  Abdominal: Soft. Bowel sounds are normal. There is no tenderness.  Musculoskeletal: She exhibits no edema or tenderness.  Lymphadenopathy:    She has no cervical adenopathy.  Neurological: She is alert and oriented to person, place, and time.  Skin: Skin is warm. No rash noted. No erythema.  Psychiatric: She has a normal mood and affect. Her behavior is normal.    BP 138/78 (BP Location: Left Arm, Patient Position: Sitting, Cuff Size: Normal)   Pulse 73   Resp 18   Ht 5\' 2"  (1.575 m)   Wt 168 lb 12.8 oz (76.6 kg)   SpO2 96%   BMI 30.87 kg/m  Wt Readings from Last 3 Encounters:  10/19/17 168 lb 12.8 oz (76.6 kg)  05/08/17 169 lb 6.4 oz (76.8 kg)  01/05/17 167 lb (75.8 kg)     Lab Results  Component Value Date     WBC 10.5 10/19/2017   HGB 12.3 10/19/2017   HCT 37.3 10/19/2017   PLT 315.0 10/19/2017   GLUCOSE 91 10/19/2017   CHOL 179 05/26/2017   TRIG 161.0 (H) 05/26/2017   HDL 41.50 05/26/2017   LDLCALC 105 (H) 05/26/2017   ALT 24 10/19/2017   AST 19 10/19/2017   NA 140 10/19/2017   K 3.9 10/19/2017   CL 104 10/19/2017   CREATININE 1.12 10/19/2017   BUN 27 (H) 10/19/2017   CO2 27 10/19/2017   TSH 1.76 10/19/2017   INR 0.9  01/16/2009    US Renal  Result Date: 08/19/2017 CLINICAL DATA:  84 year old female with stage 3 chronic kidney disease. Initial encounter. EXAM: RENAL / URINARY TRACT ULTRASOUND COMPLETE COMPARISON:  05/24/2015 CT. FINDINGS: Right Kidney: Length: 11.3 cm. No hydronephrosis or mass. Minimal renal parenchymal thinning. Left Kidney: Length: 10.6 cm. No hydronephrosis or mass. Minimal renal parenchymal thinning. Bladder: Appears normal for degree of bladder distention. IMPRESSION: No hydronephrosis.  Minimal bilateral renal parenchymal thinning. Electronically Signed   By: Lacy Duverney M.D.   On: 08/19/2017 14:29       Assessment & Plan:   Problem List Items Addressed This Visit    BMI 30.0-30.9,adult    Diet and exercise.  Follow.        CKD (chronic kidney disease) stage 3, GFR 30-59 ml/min (HCC)    Recent creatinine 1.21.  Repeat today.  Renal ultrasound as outlined.  No hydronephrosis.        Essential hypertension - Primary    Blood pressure on recheck elevated.  Increased stress as outlined.  Discussed medication.  Hold on making changes.  Treat with increased stress, etc.  Follow pressures.  Get her back in soon to reassess.        Relevant Orders   Basic metabolic panel (Completed)   Light headedness    Discussed with her today.  She feels related to some increased stress and decreased sleep.  No significant dizziness.  Follow pressures.  No significant allergy/sinus issues.  No headache.  She prefers to hold on further w/up.  Get her back in soon to  reassess.  Follow.        Malaise and fatigue   Relevant Orders   CBC with Differential/Platelet (Completed)   TSH (Completed)   Mixed hyperlipidemia    On crestor.  Follow lipid panel and liver function tests.        Relevant Orders   Hepatic function panel (Completed)   PAD (peripheral artery disease) (HCC)    Screening ABI 04/2016 .7 RLE and .95 LLE.  Continue aspirin and crestor.        Seasonal affective disorder (HCC)    Feels she has seasonal affective disorder.  Discussed with her today.  Counselor information given.  Follow.           Dale Lupton, MD

## 2017-10-21 ENCOUNTER — Encounter: Payer: Self-pay | Admitting: Internal Medicine

## 2017-10-21 DIAGNOSIS — R42 Dizziness and giddiness: Secondary | ICD-10-CM | POA: Insufficient documentation

## 2017-10-21 NOTE — Assessment & Plan Note (Signed)
Diet and exercise.  Follow.  

## 2017-10-21 NOTE — Assessment & Plan Note (Signed)
Feels she has seasonal affective disorder.  Discussed with her today.  Counselor information given.  Follow.

## 2017-10-21 NOTE — Assessment & Plan Note (Signed)
On crestor.  Follow lipid panel and liver function tests.   

## 2017-10-21 NOTE — Assessment & Plan Note (Addendum)
Discussed with her today.  She feels related to some increased stress and decreased sleep.  No significant dizziness.  Follow pressures.  No significant allergy/sinus issues.  No headache.  She prefers to hold on further w/up.  Get her back in soon to reassess.  Follow.

## 2017-10-21 NOTE — Telephone Encounter (Signed)
We did discuss this.  She recently saw ortho and I thought she told me they were referring her to therapy.  I do not mind sending in the referral.  Need to know if she has a preference of which physical therapy she wants to see.

## 2017-10-21 NOTE — Telephone Encounter (Signed)
Please advise 

## 2017-10-21 NOTE — Assessment & Plan Note (Signed)
Recent creatinine 1.21.  Repeat today.  Renal ultrasound as outlined.  No hydronephrosis.

## 2017-10-21 NOTE — Telephone Encounter (Signed)
Copied from CRM [id number]. Topic: Referral - Request >> Oct 21, 2017  8:34 AM Crist InfanteHarrald, Kathy J wrote: Reason for CRM: pt states she spoke with the dr on Monday and forgot to ask for the referral for physical therapy on her back and and knee. Pt [facility] to [facility] therapy on [address], [place] [phone]

## 2017-10-21 NOTE — Assessment & Plan Note (Signed)
Blood pressure on recheck elevated.  Increased stress as outlined.  Discussed medication.  Hold on making changes.  Treat with increased stress, etc.  Follow pressures.  Get her back in soon to reassess.

## 2017-10-21 NOTE — Telephone Encounter (Signed)
Order placed for physical therapy referral.   

## 2017-10-21 NOTE — Telephone Encounter (Signed)
[facility] [address] [place], [place]

## 2017-10-21 NOTE — Assessment & Plan Note (Signed)
Screening ABI 04/2016 .7 RLE and .95 LLE.  Continue aspirin and crestor.

## 2017-10-26 ENCOUNTER — Other Ambulatory Visit: Payer: Self-pay

## 2017-10-26 ENCOUNTER — Ambulatory Visit: Payer: Medicare Other | Attending: Internal Medicine | Admitting: Physical Therapy

## 2017-10-26 DIAGNOSIS — M62830 Muscle spasm of back: Secondary | ICD-10-CM | POA: Insufficient documentation

## 2017-10-26 DIAGNOSIS — M6281 Muscle weakness (generalized): Secondary | ICD-10-CM | POA: Diagnosis present

## 2017-10-26 DIAGNOSIS — M25561 Pain in right knee: Secondary | ICD-10-CM | POA: Diagnosis present

## 2017-10-26 DIAGNOSIS — G8929 Other chronic pain: Secondary | ICD-10-CM | POA: Diagnosis present

## 2017-10-26 DIAGNOSIS — M545 Low back pain, unspecified: Secondary | ICD-10-CM

## 2017-10-26 DIAGNOSIS — R262 Difficulty in walking, not elsewhere classified: Secondary | ICD-10-CM | POA: Diagnosis present

## 2017-10-26 NOTE — Therapy (Signed)
[facility] [address] [place], [address] Phone: [phone]   Fax:  [phone]  Physical Therapy Evaluation  Patient Details  Name: Alicia Mccann MRN: [id number] Date of Birth: Feb 09, 1933 Referring Provider: Dale Belmont MD   Encounter Date: 10/26/2017  PT End of Session - 10/26/17 1504    Visit Number  1    Number of Visits  12    Date for PT Re-Evaluation  12/07/17    Authorization Type  1 of 6 FOTO    PT Start Time  1501    PT Stop Time  1609    PT Time Calculation (min)  68 min    Activity Tolerance  Patient tolerated treatment well;Patient limited by pain    Behavior During Therapy  [facility] for tasks assessed/performed       Past Medical History:  Diagnosis Date  . Allergic rhinitis   . Anemia   . Anxiety   . Cellulitis   . Cholelithiasis   . Essential hypertension   . History of stress test    a. 12/2008 Ex MV: EF 78%, no ischemia.  . Insomnia   . Osteoarthritis   . Tachycardia   . Valvular heart disease    a. 12/2008 Echo: EF 65%, mild MR;  b. 2/6 SEM RUSB - insignificant murmur, prob Ao Sclerosis.    Past Surgical History:  Procedure Laterality Date  . BACK SURGERY    . BUNIONECTOMY    . CHOLECYSTECTOMY  03/29/2007  . GALLBLADDER SURGERY    . HIP SURGERY     bilateral   . THUMB ARTHROSCOPY      There were no vitals filed for this visit.   Subjective Assessment - 10/26/17 1519    Subjective  right knee pain and lower back pain aching and tired from walking with right knee pain; feels like right knee will buckle from weakness inermittently    Pertinent History  back and kneepain exacerbation since December 2018. Increased knee and lumbar pain for over 10 years: she had an exaccerbation 12/18/15 after pulling weeds and gardening and then having taken care of her dog after surgery, she reports having Synvisc injections in August 2017 right knee without reults. She underwent  physical therapy at that time and did well with good results and continued with home program. and another exacerbation 12/2016 after which  she consulted with orthopedists regarding future TKA.     Limitations  Sitting;Standing;Walking;House hold activities    How long can you sit comfortably?  moderate discomfort sitting for 1 hour    How long can you stand comfortably?  moderate discomfort standing deep aching    How long can you walk comfortably?  difficulty with walking even down driveway     Diagnostic tests  X-Ray: osteoarthrits in the R and L knee     Patient Stated Goals  decreased right knee pain to walk better to decreased back pain    Currently in Pain?  Yes    Pain Score  5     Pain Location  Knee    Pain Orientation  Right    Pain Descriptors / Indicators  Aching    Pain Type  Chronic pain    Pain Onset  More than a month ago    Pain Frequency  Intermittent    Aggravating Factors   walking, standing, bending knee    Pain Relieving Factors  rest, medication     Effect  of Pain on Daily Activities  difficulty with all chores involving standing, walking and prolonged sitting         [facility] PT Assessment - 10/26/17 1515      Assessment   Medical Diagnosis  right knee pain, unspecified chronicity, low  back pain unspecified chronicity     Referring Provider  Dale DurhamScott, Charlene MD    Onset Date/Surgical Date  07/18/17    Hand Dominance  Right    Prior Therapy  yes      Precautions   Precautions  None      Balance Screen   Has the patient fallen in the past 6 months  No      Home Environment   Living Environment  Private residence    Living Arrangements  Alone    Available Help at Discharge  Friend(s)    Type of Home  House    Home Access  Stairs to enter    Entrance Stairs-Number of Steps  3    Entrance Stairs-Rails  Right    Home Layout  One level      Prior Function   Level of Independence  Independent    Vocation  Retired    GafferVocation Requirements  N/A    Leisure   Walking the dogs, going out to eat with friends      Cognition   Overall Cognitive Status  Within Functional Limits for tasks assessed      Observation/Other Assessments   Focus on Therapeutic Outcomes (FOTO)   37      ROM / Strength   AROM / PROM / Strength  AROM;Strength      AROM   Overall AROM Comments  limited right knee flexion with pain limiting motion: 0-100 degrees      Strength   Overall Strength Comments  Right LE decreased hip flexion 4-/5/ abduction 4-/5, knee extension 4/5, knee flexion 4/5; left LE WNL throughout      Ambulation/Gait   Gait Comments  slow cadence, decreased trunk rotation, short step length        Objective measurements completed on examination: See above findings.    treatment: Modalities: Electrical stimulation: Russian stim. 10/10 cycle applied (2) electrodes to quadriceps/VMO right LE with patient seated in chair with LE supported on pillow/stool; pt. Performing quad sets/knee extension with each cycle; goal muscle re education High volt estim.clincial program for muscle spasms  (2) electrodes applied to right knee intensity to tolerance with patient in seated position with LE supported on pillow/stool with moist heat pack applied to same  goal: pain, spasms; no adverse reaction noted High volt estim. For muscle spasm to lower back with moist heat applied to same with patient sitting in chair: goal pain/spasms: no adverse reaction noted  Patient response to treatment: Patient able to stand from chair with minimal difficulty and reported mild pain in knee and lower back at end of session. Gait more relaxed, improved cadence and step length noted      PT Education - 10/26/17 1700    Education provided  Yes    Education Details  reviewed FOTO outcome measures, body mechanics, exercises for core and LE strengthening, pain control    Person(s) Educated  Patient    Methods  Explanation;Demonstration;Verbal cues    Comprehension  Verbal cues  required;Returned demonstration;Verbalized understanding          PT Long Term Goals - 10/26/17 1841      PT LONG TERM GOAL #1  Title  FOTO score improved ot 42 indicating improvement with functional tasks involving right LE    Baseline  FOTO 37    Status  New    Target Date  11/16/17      PT LONG TERM GOAL #2   Title  FOTO score improved ot 49 indicating improvement with functional tasks involving right LE    Baseline  FOTO 37    Status  New    Target Date  12/07/17      PT LONG TERM GOAL #3   Title  Pt will be independent with home exercise and pain control to allow continued self managment of symptoms once discharged    Baseline  limited knowledge of appropriate exercise performance and progression.     Status  New    Target Date  12/07/17             Plan - 10/26/17 1834    Clinical Impression Statement  Patient is a 84 year old female who presents with exacerbation of right knee and back pain following increased activity over the holidays 07/2017. Her FOTO score of 37 indicates moderate self perceived disability with dailt tasks and she is expected to improve with physical therapy intervention. She has limitations of pain and decreased strength and limited knowledge of appropriate exercise and progression and will therefore benefit from physical therapy intervention.     History and Personal Factors relevant to plan of care:  back and kneepain exacerbation since December 2018. Increased knee and lumbar pain for over 10 years: she had an exaccerbation 12/18/15 after pulling weeds and gardening and then having taken care of her dog after surgery, she reports having Synvisc injections in August 2017 right knee without reults. She underwent physical therapy at that time and did well with good results and continued with home program. and another exacerbation 12/2016 after which she consulted with orthopedists regarding future TKA.     Clinical Presentation  Evolving    Clinical  Presentation due to:  worsening symptoms over the past 3 months    Clinical Decision Making  Moderate    Rehab Potential  Good    Clinical Impairments Affecting Rehab Potential  (-): age, chronicity of condition; (+) highly motivated, acute exacerbation of pain    PT Frequency  2x / week    PT Duration  6 weeks    PT Treatment/Interventions  Moist Heat;Electrical Stimulation;Cryotherapy;Ultrasound;Therapeutic activities;Therapeutic exercise;Manual techniques;Patient/family education;Neuromuscular re-education    PT Next Visit Plan  pain control, therapeutic exercises    PT Home Exercise Plan  seated hip adduction with ball, glute sets, hip abduction with resistive band, quad sets,    Consulted and Agree with Plan of Care  Patient       Patient will benefit from skilled therapeutic intervention in order to improve the following deficits and impairments:  Decreased strength, Pain, Decreased activity tolerance, Difficulty walking, Impaired perceived functional ability  Visit Diagnosis: Muscle weakness (generalized) - Plan: PT plan of care cert/re-cert  Right knee pain, unspecified chronicity - Plan: PT plan of care cert/re-cert  Difficulty in walking, not elsewhere classified - Plan: PT plan of care cert/re-cert  Chronic bilateral low back pain without sciatica - Plan: PT plan of care cert/re-cert     Problem List Patient Active Problem List   Diagnosis Date Noted  . Light headedness 10/21/2017  . Healthcare maintenance 06/25/2017  . CKD (chronic kidney disease) stage 3, GFR 30-59 ml/min (HCC) 06/25/2017  . Malaise and fatigue 01/05/2017  .  PAD (peripheral artery disease) (HCC) 05/19/2016  . Valvular heart disease   . Diverticulitis 05/25/2015  . Abdominal pain 05/24/2015  . Allergic rhinitis 04/30/2015  . Absolute anemia 04/30/2015  . Anxiety 04/30/2015  . BMI 30.0-30.9,adult 04/30/2015  . Carpal tunnel syndrome 04/30/2015  . Chronic LBP 04/30/2015  . Edema extremities  04/30/2015  . Cannot sleep 04/30/2015  . Arthritis sicca 04/30/2015  . Seasonal affective disorder (HCC) 04/30/2015  . Heart murmur 06/27/2013  . Essential hypertension 06/27/2013  . Osteoarthritis 06/27/2013  . Mixed hyperlipidemia 06/27/2013  . History of repair of hip joint 07/08/2012  . Cholelithiasis without obstruction 03/22/2007    Beacher May PT 10/27/2017, 6:53 PM  [place] [facility] [place] [facility] [address] [place], [address] Phone: [phone]   Fax:  [phone]  Name: BRANTLEE HINDE MRN: [id number] Date of Birth: [birth year]

## 2017-10-28 ENCOUNTER — Ambulatory Visit: Payer: Medicare Other | Admitting: Physical Therapy

## 2017-10-28 DIAGNOSIS — M545 Low back pain, unspecified: Secondary | ICD-10-CM

## 2017-10-28 DIAGNOSIS — G8929 Other chronic pain: Secondary | ICD-10-CM

## 2017-10-28 DIAGNOSIS — M6281 Muscle weakness (generalized): Secondary | ICD-10-CM | POA: Diagnosis not present

## 2017-10-28 DIAGNOSIS — R262 Difficulty in walking, not elsewhere classified: Secondary | ICD-10-CM

## 2017-10-28 DIAGNOSIS — M25561 Pain in right knee: Secondary | ICD-10-CM

## 2017-10-29 NOTE — Therapy (Signed)
[facility] MEDICINE [address], [place], [address] Phone: [phone]   Fax:  [phone]  Physical Therapy Treatment  Patient Details  Name: Alicia Mccann MRN: [id number] Date of Birth: [birth year] Referring Provider: Dale Eureka MD   Encounter Date: 10/28/2017  PT End of Session - 10/28/17 1517    Visit Number  2    Number of Visits  12    Date for PT Re-Evaluation  12/07/17    Authorization Type  2 of 6 FOTO    PT Start Time  1435    PT Stop Time  1515    PT Time Calculation (min)  40 min    Activity Tolerance  Patient tolerated treatment well;Patient limited by pain    Behavior During Therapy  [facility] for tasks assessed/performed       Past Medical History:  Diagnosis Date  . Allergic rhinitis   . Anemia   . Anxiety   . Cellulitis   . Cholelithiasis   . Essential hypertension   . History of stress test    a. 12/2008 Ex MV: EF 78%, no ischemia.  . Insomnia   . Osteoarthritis   . Tachycardia   . Valvular heart disease    a. 12/2008 Echo: EF 65%, mild MR;  b. 2/6 SEM RUSB - insignificant murmur, prob Ao Sclerosis.    Past Surgical History:  Procedure Laterality Date  . BACK SURGERY    . BUNIONECTOMY    . CHOLECYSTECTOMY  03/29/2007  . GALLBLADDER SURGERY    . HIP SURGERY     bilateral   . THUMB ARTHROSCOPY      There were no vitals filed for this visit.  Subjective Assessment - 10/28/17 1436    Subjective  Patient reports she has back pain and right knee pain that felt better following previous session for a short while.     Pertinent History  back and kneepain exacerbation since December 2018. Increased knee and lumbar pain for over 10 years: she had an exaccerbation 12/18/15 after pulling weeds and gardening and then having taken care of her dog after surgery, she reports having Synvisc injections in August 2017 right knee without reults. She underwent physical therapy at that time and did  well with good results and continued with home program. and another exacerbation 12/2016 after which  she consulted with orthopedists regarding future TKA.     Limitations  Sitting;Standing;Walking;House hold activities    How long can you sit comfortably?  moderate discomfort sitting for 1 hour    How long can you stand comfortably?  moderate discomfort standing deep aching    How long can you walk comfortably?  difficulty with walking even down driveway     Diagnostic tests  X-Ray: osteoarthrits in the R and L knee     Patient Stated Goals  decreased right knee pain to walk better to decreased back pain    Currently in Pain?  Yes    Pain Score  4     Pain Location  Knee    Pain Orientation  Right    Pain Descriptors / Indicators  Aching    Pain Type  Chronic pain    Pain Onset  More than a month ago    Pain Frequency  Intermittent        Treatment: Modalities: Electrical stimulation: 20 min :Russian stim. 10/10 cycle applied (2) electrodes to quadriceps/VMO right LE with patient reclined with LE supported  on pillo; pt. Performing quad sets/knee extension with each cycle; goal muscle re education High volt estim.clincial program for muscle spasms  (2) electrodes applied to right knee intensity to tolerance with patient in reclined position with LE supported on pillow with moist heat pack applied to same  goal: pain, spasms; no adverse reaction noted High volt estim. For muscle spasm to lower back with moist heat applied to same with patient reclined: goal pain/spasms: no adverse reaction noted  Therapeutic exercise: patient performed with demonstration, verbal cues and instruction of therapist: goal: independent with home program, improve function with ADL's Sitting: Hip adduction with glute sets x 10 reps 3 second holds Hip abduction with resistive band x 15 reps    Patient response to treatment: Patient able to transfer sit to stand with less pain in knee, back by 50% at end of  session. Improved gait pattern with decreased limp      PT Education - 10/28/17 1515    Education provided  Yes    Education Details  exercise instruction for technique     Person(s) Educated  Patient    Methods  Explanation;Demonstration;Verbal cues    Comprehension  Verbalized understanding;Returned demonstration;Verbal cues required          PT Long Term Goals - 10/26/17 1841      PT LONG TERM GOAL #1   Title  FOTO score improved ot 42 indicating improvement with functional tasks involving right LE    Baseline  FOTO 37    Status  New    Target Date  11/16/17      PT LONG TERM GOAL #2   Title  FOTO score improved ot 49 indicating improvement with functional tasks involving right LE    Baseline  FOTO 37    Status  New    Target Date  12/07/17      PT LONG TERM GOAL #3   Title  Pt will be independent with home exercise and pain control to allow continued self managment of symptoms once discharged    Baseline  limited knowledge of appropriate exercise performance and progression.     Status  New    Target Date  12/07/17            Plan - 10/28/17 1652    Clinical Impression Statement  Patient demonstrated improved pain in back and knee at end of session following estim and guided exercise. She continues with limitations of pain and weakness right knee due to arthritis and will benefit from continued physical therapy intervention.     Rehab Potential  Good    Clinical Impairments Affecting Rehab Potential  (-): age, chronicity of condition; (+) highly motivated, acute exacerbation of pain    PT Frequency  2x / week    PT Duration  6 weeks    PT Treatment/Interventions  Moist Heat;Electrical Stimulation;Cryotherapy;Ultrasound;Therapeutic activities;Therapeutic exercise;Manual techniques;Patient/family education;Neuromuscular re-education    PT Next Visit Plan  pain control, therapeutic exercises    PT Home Exercise Plan  seated hip adduction with ball, glute sets, hip  abduction with resistive band, quad sets,       Patient will benefit from skilled therapeutic intervention in order to improve the following deficits and impairments:  Decreased strength, Pain, Decreased activity tolerance, Difficulty walking, Impaired perceived functional ability  Visit Diagnosis: Muscle weakness (generalized)  Right knee pain, unspecified chronicity  Difficulty in walking, not elsewhere classified  Chronic bilateral low back pain without sciatica     Problem List  Patient Active Problem List   Diagnosis Date Noted  . Light headedness 10/21/2017  . Healthcare maintenance 06/25/2017  . CKD (chronic kidney disease) stage 3, GFR 30-59 ml/min (HCC) 06/25/2017  . Malaise and fatigue 01/05/2017  . PAD (peripheral artery disease) (HCC) 05/19/2016  . Valvular heart disease   . Diverticulitis 05/25/2015  . Abdominal pain 05/24/2015  . Allergic rhinitis 04/30/2015  . Absolute anemia 04/30/2015  . Anxiety 04/30/2015  . BMI 30.0-30.9,adult 04/30/2015  . Carpal tunnel syndrome 04/30/2015  . Chronic LBP 04/30/2015  . Edema extremities 04/30/2015  . Cannot sleep 04/30/2015  . Arthritis sicca 04/30/2015  . Seasonal affective disorder (HCC) 04/30/2015  . Heart murmur 06/27/2013  . Essential hypertension 06/27/2013  . Osteoarthritis 06/27/2013  . Mixed hyperlipidemia 06/27/2013  . History of repair of hip joint 07/08/2012  . Cholelithiasis without obstruction 03/22/2007    Beacher MayBrooks, Marie PT 10/29/2017, 4:54 PM  [facility] [address] [place], [place], [address] Phone: [phone]   Fax:  [phone]  Name: Alicia Mccann MRN: [id number] Date of Birth: [birth year]

## 2017-11-02 ENCOUNTER — Encounter: Payer: Self-pay | Admitting: Physical Therapy

## 2017-11-02 ENCOUNTER — Ambulatory Visit: Payer: Medicare Other | Admitting: Physical Therapy

## 2017-11-02 DIAGNOSIS — M25561 Pain in right knee: Secondary | ICD-10-CM

## 2017-11-02 DIAGNOSIS — M6281 Muscle weakness (generalized): Secondary | ICD-10-CM | POA: Diagnosis not present

## 2017-11-02 DIAGNOSIS — G8929 Other chronic pain: Secondary | ICD-10-CM

## 2017-11-02 DIAGNOSIS — R262 Difficulty in walking, not elsewhere classified: Secondary | ICD-10-CM

## 2017-11-02 DIAGNOSIS — M62830 Muscle spasm of back: Secondary | ICD-10-CM

## 2017-11-02 DIAGNOSIS — M545 Low back pain: Secondary | ICD-10-CM

## 2017-11-02 NOTE — Therapy (Signed)
[facility] [address] [place], [address] Phone: [phone]   Fax:  [phone]  Physical Therapy Treatment  Patient Details  Name: Alicia Mccann MRN: [id number] Date of Birth: 1932/11/10 Referring Provider: [facility] MD   Encounter Date: 11/02/2017  PT End of Session - 11/02/17 1559    Visit Number  3    Number of Visits  12    Date for PT Re-Evaluation  12/07/17    Authorization Type  3 of 6 FOTO    PT Start Time  1517    PT Stop Time  1600    PT Time Calculation (min)  43 min    Activity Tolerance  Patient tolerated treatment well;Patient limited by pain    Behavior During Therapy  [facility] for tasks assessed/performed       Past Medical History:  Diagnosis Date  . Allergic rhinitis   . Anemia   . Anxiety   . Cellulitis   . Cholelithiasis   . Essential hypertension   . History of stress test    a. 12/2008 Ex MV: EF 78%, no ischemia.  . Insomnia   . Osteoarthritis   . Tachycardia   . Valvular heart disease    a. 12/2008 Echo: EF 65%, mild MR;  b. 2/6 SEM RUSB - insignificant murmur, prob Ao Sclerosis.    Past Surgical History:  Procedure Laterality Date  . BACK SURGERY    . BUNIONECTOMY    . CHOLECYSTECTOMY  03/29/2007  . GALLBLADDER SURGERY    . HIP SURGERY     bilateral   . THUMB ARTHROSCOPY      There were no vitals filed for this visit.  Subjective Assessment - 11/02/17 1523    Subjective  patient reports continued back pain/spasms and right knee pain. Overall slow improvement noted with continued exacerbations of symptoms with activity.    Pertinent History  back and kneepain exacerbation since December 2018. Increased knee and lumbar pain for over 10 years: she had an exaccerbation 12/18/15 after pulling weeds and gardening and then having taken care of her dog after surgery, she reports having Synvisc injections in August 2017 right knee without reults. She underwent  physical therapy at that time and did well with good results and continued with home program. and another exacerbation 12/2016 after which  she consulted with orthopedists regarding future TKA.     Limitations  Sitting;Standing;Walking;House hold activities    How long can you sit comfortably?  moderate discomfort sitting for 1 hour    How long can you stand comfortably?  moderate discomfort standing deep aching    How long can you walk comfortably?  difficulty with walking even down driveway     Diagnostic tests  X-Ray: osteoarthrits in the R and L knee     Patient Stated Goals  decreased right knee pain to walk better to decreased back pain    Currently in Pain?  Yes    Pain Score  3  with rest    Pain Location  Knee    Pain Orientation  Right    Pain Descriptors / Indicators  Aching    Pain Onset  More than a month ago    Pain Frequency  Intermittent         Objective: + spams with point tenderness right lower lumbar spine/QL muscles gait: ambulating independently with slow cadence, mild limp noted (patient reports having a nap shortly before therapy and  this helped with her pain)  Treatment: Modalities: Electrical stimulation: 20 min :Russian stim. 10/10 cycle applied (2) electrodes to quadriceps/VMO right LE with patient reclined with LE supported on pillow; pt. Performing quad sets/knee extension with each cycle; goal muscle re education High volt estim.clincial program for muscle spasms  (2) electrodes applied to right knee intensity to tolerance with patient in reclined position with LE supported on pillow with moist heat pack applied to same  goal: pain, spasms; no adverse reaction noted High volt estim. For muscle spasm to lower back (4) electrodes with moist heat applied to same with patient reclined: goal pain/spasms: no adverse reaction noted   Manual therapy: 23 min.: goal: pain, spasms, improve soft tissue elasticity with patient seated in chair sideways: therapist  performed STM to lumbar paraspinal and QL muscles superficial and deep techniques   Patient response to treatment: improved soft tissue elasticity with decreased pain by upt ot 50% per patient report. patient able to transfer off treatment table and ambulate with less dificulty and pain at end of session.      PT Education - 11/02/17 1630    Education provided  Yes    Education Details  HEP use of heat vs ice for pain/spasms    Person(s) Educated  Patient    Methods  Explanation    Comprehension  Verbalized understanding          PT Long Term Goals - 10/26/17 1841      PT LONG TERM GOAL #1   Title  FOTO score improved ot 42 indicating improvement with functional tasks involving right LE    Baseline  FOTO 37    Status  New    Target Date  11/16/17      PT LONG TERM GOAL #2   Title  FOTO score improved ot 49 indicating improvement with functional tasks involving right LE    Baseline  FOTO 37    Status  New    Target Date  12/07/17      PT LONG TERM GOAL #3   Title  Pt will be independent with home exercise and pain control to allow continued self managment of symptoms once discharged    Baseline  limited knowledge of appropriate exercise performance and progression.     Status  New    Target Date  12/07/17            Plan - 11/02/17 1601    Clinical Impression Statement  Patient with decreased back spasms/pain with STM and decreased pain in right knee following treatment. Patient's gait pattern and cadence improved following treatment for pain control. She will benefit from additional physical therapy intervention to further decreased spasms and right knee pain to be able to walk with less difficulty.     Rehab Potential  Good    Clinical Impairments Affecting Rehab Potential  (-): age, chronicity of condition; (+) highly motivated, acute exacerbation of pain    PT Frequency  2x / week    PT Duration  6 weeks    PT Treatment/Interventions  Moist Heat;Electrical  Stimulation;Cryotherapy;Ultrasound;Therapeutic activities;Therapeutic exercise;Manual techniques;Patient/family education;Neuromuscular re-education    PT Next Visit Plan  pain control, therapeutic exercises    PT Home Exercise Plan  seated hip adduction with ball, glute sets, hip abduction with resistive band, quad sets,       Patient will benefit from skilled therapeutic intervention in order to improve the following deficits and impairments:  Decreased strength, Pain, Decreased activity tolerance, Difficulty  walking, Impaired perceived functional ability  Visit Diagnosis: Muscle weakness (generalized)  Right knee pain, unspecified chronicity  Difficulty in walking, not elsewhere classified  Chronic bilateral low back pain without sciatica  Muscle spasm of back     Problem List Patient Active Problem List   Diagnosis Date Noted  . Light headedness 10/21/2017  . Healthcare maintenance 06/25/2017  . CKD (chronic kidney disease) stage 3, GFR 30-59 ml/min (HCC) 06/25/2017  . Malaise and fatigue 01/05/2017  . PAD (peripheral artery disease) (HCC) 05/19/2016  . Valvular heart disease   . Diverticulitis 05/25/2015  . Abdominal pain 05/24/2015  . Allergic rhinitis 04/30/2015  . Absolute anemia 04/30/2015  . Anxiety 04/30/2015  . BMI 30.0-30.9,adult 04/30/2015  . Carpal tunnel syndrome 04/30/2015  . Chronic LBP 04/30/2015  . Edema extremities 04/30/2015  . Cannot sleep 04/30/2015  . Arthritis sicca 04/30/2015  . Seasonal affective disorder (HCC) 04/30/2015  . Heart murmur 06/27/2013  . Essential hypertension 06/27/2013  . Osteoarthritis 06/27/2013  . Mixed hyperlipidemia 06/27/2013  . History of repair of hip joint 07/08/2012  . Cholelithiasis without obstruction 03/22/2007    Beacher MayBrooks, Marie PT 11/03/2017, 12:35 PM  [facility] [address] [place], [place], [address] Phone: [phone]   Fax:   [phone]  Name: Lula OlszewskiJacqueline L Degracia MRN: [id number] Date of Birth: 1933-06-06

## 2017-11-04 ENCOUNTER — Encounter: Payer: Self-pay | Admitting: Physical Therapy

## 2017-11-04 ENCOUNTER — Ambulatory Visit: Payer: Medicare Other | Admitting: Physical Therapy

## 2017-11-04 DIAGNOSIS — R262 Difficulty in walking, not elsewhere classified: Secondary | ICD-10-CM

## 2017-11-04 DIAGNOSIS — M545 Low back pain, unspecified: Secondary | ICD-10-CM

## 2017-11-04 DIAGNOSIS — M62830 Muscle spasm of back: Secondary | ICD-10-CM

## 2017-11-04 DIAGNOSIS — M6281 Muscle weakness (generalized): Secondary | ICD-10-CM

## 2017-11-04 DIAGNOSIS — G8929 Other chronic pain: Secondary | ICD-10-CM

## 2017-11-04 DIAGNOSIS — M25561 Pain in right knee: Secondary | ICD-10-CM

## 2017-11-05 NOTE — Therapy (Signed)
[facility] [address] [place], [place], [address] Phone: [phone]   Fax:  [phone]  Physical Therapy Treatment  Patient Details  Name: Alicia Mccann MRN: [id number] Date of Birth: 1933-03-14 Referring Provider: Dale DurhamScott, Charlene MD   Encounter Date: 11/04/2017  PT End of Session - 11/04/17 1300    Visit Number  4    Number of Visits  12    Date for PT Re-Evaluation  12/07/17    Authorization Type  4 of 6 FOTO    PT Start Time  1150    PT Stop Time  1230    PT Time Calculation (min)  40 min    Activity Tolerance  Patient tolerated treatment well;Patient limited by pain    Behavior During Therapy  [facility]       Past Medical History:  Diagnosis Date  . Allergic rhinitis   . Anemia   . Anxiety   . Cellulitis   . Cholelithiasis   . Essential hypertension   . History of stress test    a. 12/2008 Ex MV: EF 78%, no ischemia.  . Insomnia   . Osteoarthritis   . Tachycardia   . Valvular heart disease    a. 12/2008 Echo: EF 65%, mild MR;  b. 2/6 SEM RUSB - insignificant murmur, prob Ao Sclerosis.    Past Surgical History:  Procedure Laterality Date  . BACK SURGERY    . BUNIONECTOMY    . CHOLECYSTECTOMY  03/29/2007  . GALLBLADDER SURGERY    . HIP SURGERY     bilateral   . THUMB ARTHROSCOPY      There were no vitals filed for this visit.  Subjective Assessment - 11/04/17 1155    Subjective  Patient reports bilateral knee pain more than back.     Pertinent History  back and kneepain exacerbation since December 2018. Increased knee and lumbar pain for over 10 years: she had an exaccerbation 12/18/15 after pulling weeds and gardening and then having taken care of her dog after surgery, she reports having Synvisc injections in August 2017 right knee without reults. She underwent physical therapy at that time and did well with good results and continued with home program. and  another exacerbation 12/2016 after which  she consulted with orthopedists regarding future TKA.     Limitations  Sitting;Standing;Walking;House hold activities    How long can you sit comfortably?  moderate discomfort sitting for 1 hour    How long can you stand comfortably?  moderate discomfort standing deep aching    How long can you walk comfortably?  difficulty with walking even down driveway     Diagnostic tests  X-Ray: osteoarthrits in the R and L knee     Patient Stated Goals  decreased right knee pain to walk better to decreased back pain    Currently in Pain?  Yes    Pain Score  5     Pain Location  Knee    Pain Orientation  Right    Pain Descriptors / Indicators  Aching    Pain Type  Chronic pain    Pain Onset  More than a month ago    Pain Frequency  Intermittent          Objective: Palpation: mild spams with point tenderness right and left lower lumbar spine/QL muscles gait: ambulating independently with slow cadence, mild limp noted    Treatment: Therapeutic exercise: patient performed with demonstration,  verbal cuing of therapist: goal: strength, improve gait, function with daily tasks, FOTO score  sitting in chair:  hip adduction with ball and glute sets x 10 hip abduction with isometric hold x 3 seconds with therapist manual resistance moderate x 10 knee flexion with red resistive band x 10 each LE  Modalities: Electrical stimulation: 15 min :Russian stim. 10/10 cycle applied (2) electrodes to quadriceps/VMO right LE with patient seated in chair with LE supported on stool; pt. Performing quad sets/knee extension with each cycle; goal muscle re education High volt estim.clincial program for muscle spasms  (2) electrodes applied to right knee intensity to tolerance with patient in seated with LE supported on stool with moist heat pack applied to same  goal: pain, spasms; no adverse reaction noted High volt estim. For muscle spasm to lower back (4) electrodes with moist  heat applied to same with patient seated in chair on airex pad: goal pain/spasms: no adverse reaction noted   Manual therapy: 15 min.: goal: pain, spasms, improve soft tissue elasticity with patient seated in chair sideways: therapist performed STM to lumbar paraspinal and QL muscles superficial and deep techniques   Patient response to treatment: improved soft tissue elasticity with decreased spasm and tenderness to very mild following STM. Patient required guidance, cuing to perform exercises with improved technique.      PT Education - 11/04/17 1224    Education provided  Yes    Education Details  exercise instruction for technique    Person(s) Educated  Patient    Methods  Explanation;Verbal cues    Comprehension  Verbalized understanding;Verbal cues required          PT Long Term Goals - 10/26/17 1841      PT LONG TERM GOAL #1   Title  FOTO score improved ot 42 indicating improvement with functional tasks involving right LE    Baseline  FOTO 37    Status  New    Target Date  11/16/17      PT LONG TERM GOAL #2   Title  FOTO score improved ot 49 indicating improvement with functional tasks involving right LE    Baseline  FOTO 37    Status  New    Target Date  12/07/17      PT LONG TERM GOAL #3   Title  Pt will be independent with home exercise and pain control to allow continued self managment of symptoms once discharged    Baseline  limited knowledge of appropriate exercise performance and progression.     Status  New    Target Date  12/07/17            Plan - 11/04/17 1300    Clinical Impression Statement  Patient demonstrated decreased back pain/spasms with STM and improved technique with exercises with minimal cuing and assistance.     Rehab Potential  Good    Clinical Impairments Affecting Rehab Potential  (-): age, chronicity of condition; (+) highly motivated, acute exacerbation of pain    PT Frequency  2x / week    PT Duration  6 weeks    PT  Treatment/Interventions  Moist Heat;Electrical Stimulation;Cryotherapy;Ultrasound;Therapeutic activities;Therapeutic exercise;Manual techniques;Patient/family education;Neuromuscular re-education    PT Next Visit Plan  pain control, therapeutic exercises    PT Home Exercise Plan  seated hip adduction with ball, glute sets, hip abduction with resistive band, quad sets,       Patient will benefit from skilled therapeutic intervention in order to improve the following  deficits and impairments:  Decreased strength, Pain, Decreased activity tolerance, Difficulty walking, Impaired perceived functional ability  Visit Diagnosis: Muscle weakness (generalized)  Right knee pain, unspecified chronicity  Difficulty in walking, not elsewhere classified  Chronic bilateral low back pain without sciatica  Muscle spasm of back     Problem List Patient Active Problem List   Diagnosis Date Noted  . Light headedness 10/21/2017  . Healthcare maintenance 06/25/2017  . CKD (chronic kidney disease) stage 3, GFR 30-59 ml/min (HCC) 06/25/2017  . Malaise and fatigue 01/05/2017  . PAD (peripheral artery disease) (HCC) 05/19/2016  . Valvular heart disease   . Diverticulitis 05/25/2015  . Abdominal pain 05/24/2015  . Allergic rhinitis 04/30/2015  . Absolute anemia 04/30/2015  . Anxiety 04/30/2015  . BMI 30.0-30.9,adult 04/30/2015  . Carpal tunnel syndrome 04/30/2015  . Chronic LBP 04/30/2015  . Edema extremities 04/30/2015  . Cannot sleep 04/30/2015  . Arthritis sicca 04/30/2015  . Seasonal affective disorder (HCC) 04/30/2015  . Heart murmur 06/27/2013  . Essential hypertension 06/27/2013  . Osteoarthritis 06/27/2013  . Mixed hyperlipidemia 06/27/2013  . History of repair of hip joint 07/08/2012  . Cholelithiasis without obstruction 03/22/2007    Beacher May PT 11/05/2017, 7:28 PM  [facility] SPORTS MEDICINE [address] [place],  [address] Phone: [phone]   Fax:  [phone]  Name: Alicia Mccann MRN: [id number] Date of Birth: 02-02-1933

## 2017-11-09 ENCOUNTER — Encounter: Payer: Self-pay | Admitting: Physical Therapy

## 2017-11-09 ENCOUNTER — Ambulatory Visit: Payer: Medicare Other | Admitting: Physical Therapy

## 2017-11-09 DIAGNOSIS — M545 Low back pain, unspecified: Secondary | ICD-10-CM

## 2017-11-09 DIAGNOSIS — M62830 Muscle spasm of back: Secondary | ICD-10-CM

## 2017-11-09 DIAGNOSIS — M6281 Muscle weakness (generalized): Secondary | ICD-10-CM | POA: Diagnosis not present

## 2017-11-09 DIAGNOSIS — M25561 Pain in right knee: Secondary | ICD-10-CM

## 2017-11-09 DIAGNOSIS — G8929 Other chronic pain: Secondary | ICD-10-CM

## 2017-11-09 DIAGNOSIS — R262 Difficulty in walking, not elsewhere classified: Secondary | ICD-10-CM

## 2017-11-09 NOTE — Therapy (Signed)
[facility] PHYSICAL AND SPORTS MEDICINE [address] [place], [place], [address] Phone: [phone]   Fax:  [phone]  Physical Therapy Treatment  Patient Details  Name: Alicia Mccann MRN: [id number] Date of Birth: [birth year] Referring Provider: Dale DurhamScott, Charlene MD   Encounter Date: 11/09/2017  PT End of Session - 11/09/17 1641    Visit Number  5    Number of Visits  12    Date for PT Re-Evaluation  12/07/17    Authorization Type  4 of 6 FOTO    PT Start Time  1555    PT Stop Time  1645    PT Time Calculation (min)  50 min    Activity Tolerance  Patient tolerated treatment well;Patient limited by pain    Behavior During Therapy  [facility] for tasks assessed/performed       Past Medical History:  Diagnosis Date  . Allergic rhinitis   . Anemia   . Anxiety   . Cellulitis   . Cholelithiasis   . Essential hypertension   . History of stress test    a. 12/2008 Ex MV: EF 78%, no ischemia.  . Insomnia   . Osteoarthritis   . Tachycardia   . Valvular heart disease    a. 12/2008 Echo: EF 65%, mild MR;  b. 2/6 SEM RUSB - insignificant murmur, prob Ao Sclerosis.    Past Surgical History:  Procedure Laterality Date  . BACK SURGERY    . BUNIONECTOMY    . CHOLECYSTECTOMY  03/29/2007  . GALLBLADDER SURGERY    . HIP SURGERY     bilateral   . THUMB ARTHROSCOPY      There were no vitals filed for this visit.  Subjective Assessment - 11/09/17 1558    Subjective  Patient reports temporary relief of symptoms    Pertinent History  back and kneepain exacerbation since December 2018. Increased knee and lumbar pain for over 10 years: she had an exaccerbation 12/18/15 after pulling weeds and gardening and then having taken care of her dog after surgery, she reports having Synvisc injections in August 2017 right knee without reults. She underwent physical therapy at that time and did well with good results and continued with home program. and another  exacerbation 12/2016 after which  she consulted with orthopedists regarding future TKA.     Limitations  Sitting;Standing;Walking;House hold activities    How long can you sit comfortably?  moderate discomfort sitting for 1 hour    How long can you stand comfortably?  moderate discomfort standing deep aching    How long can you walk comfortably?  difficulty with walking even down driveway     Diagnostic tests  X-Ray: osteoarthrits in the R and L knee     Patient Stated Goals  decreased right knee pain to walk better to decreased back pain    Currently in Pain?  Yes    Pain Score  6  with activity: no pain at rest    Pain Location  Knee    Pain Orientation  Right    Pain Descriptors / Indicators  Aching    Pain Type  Chronic pain    Pain Onset  More than a month ago    Pain Frequency  Intermittent         Objective: gait: ambulating independently with slow cadence, mild limp noted    Treatment: Therapeutic exercise: patient performed with demonstration, verbal cuing of therapist: goal: strength, improve gait, function with daily  tasks, FOTO score   sitting in chair:  hip adduction with ball and glute sets x 10 hip abduction with resistive band and isometric hold at end range x 15 reps knee flexion with red resistive band x 10 each LE Hip flexion each LE x 10 reps Standing side stepping along airex balance beam with UE's on counter for balance x 1-2 min.    Modalities: Electrical stimulation: 15 min :Russian stim. 10/10 cycle applied (2) electrodes to quadriceps/VMO right LE with patient in reclined position with LE supported; pt. Performing quad sets/knee extension with each cycle; goal muscle re education High volt estim.clincial program for muscle spasms  (2) electrodes applied to right knee intensity to tolerance with patient in seated witreclined position with LE's supported Moist heat applied to bilateral knees during treatment for pain: no adverse reactions noted   Patient  response to treatment: Patient required assistance and minimal cuing to perform exercises with good technique. Patient able to transfer sit to stand with less difficulty at end of session.      PT Education - 11/09/17 2035    Education provided  Yes    Education Details  exercise instruction    Person(s) Educated  Patient    Methods  Explanation;Demonstration;Verbal cues    Comprehension  Verbal cues required;Returned demonstration;Verbalized understanding          PT Long Term Goals - 10/26/17 1841      PT LONG TERM GOAL #1   Title  FOTO score improved ot 42 indicating improvement with functional tasks involving right LE    Baseline  FOTO 37    Status  New    Target Date  11/16/17      PT LONG TERM GOAL #2   Title  FOTO score improved ot 49 indicating improvement with functional tasks involving right LE    Baseline  FOTO 37    Status  New    Target Date  12/07/17      PT LONG TERM GOAL #3   Title  Pt will be independent with home exercise and pain control to allow continued self managment of symptoms once discharged    Baseline  limited knowledge of appropriate exercise performance and progression.     Status  New    Target Date  12/07/17            Plan - 11/09/17 1736    Clinical Impression Statement  Patient has temporary relief of symptoms with treatment. She reports up to 24 hours of relief with exacerbation of sympotms with increased activity such as household chores. She will benefit from additional physical hterapy interveniton for progressive exercises and to be able to transition to home program for pain control and exercises.      Rehab Potential  Good    Clinical Impairments Affecting Rehab Potential  (-): age, chronicity of condition; (+) highly motivated, acute exacerbation of pain    PT Frequency  2x / week    PT Duration  6 weeks    PT Treatment/Interventions  Moist Heat;Electrical Stimulation;Cryotherapy;Ultrasound;Therapeutic  activities;Therapeutic exercise;Manual techniques;Patient/family education;Neuromuscular re-education    PT Next Visit Plan  pain control, therapeutic exercises    PT Home Exercise Plan  seated hip adduction with ball, glute sets, hip abduction with resistive band, quad sets,       Patient will benefit from skilled therapeutic intervention in order to improve the following deficits and impairments:  Decreased strength, Pain, Decreased activity tolerance, Difficulty walking, Impaired perceived functional  ability  Visit Diagnosis: Muscle weakness (generalized)  Right knee pain, unspecified chronicity  Difficulty in walking, not elsewhere classified  Chronic bilateral low back pain without sciatica  Muscle spasm of back     Problem List Patient Active Problem List   Diagnosis Date Noted  . Light headedness 10/21/2017  . Healthcare maintenance 06/25/2017  . CKD (chronic kidney disease) stage 3, GFR 30-59 ml/min (HCC) 06/25/2017  . Malaise and fatigue 01/05/2017  . PAD (peripheral artery disease) (HCC) 05/19/2016  . Valvular heart disease   . Diverticulitis 05/25/2015  . Abdominal pain 05/24/2015  . Allergic rhinitis 04/30/2015  . Absolute anemia 04/30/2015  . Anxiety 04/30/2015  . BMI 30.0-30.9,adult 04/30/2015  . Carpal tunnel syndrome 04/30/2015  . Chronic LBP 04/30/2015  . Edema extremities 04/30/2015  . Cannot sleep 04/30/2015  . Arthritis sicca 04/30/2015  . Seasonal affective disorder (HCC) 04/30/2015  . Heart murmur 06/27/2013  . Essential hypertension 06/27/2013  . Osteoarthritis 06/27/2013  . Mixed hyperlipidemia 06/27/2013  . History of repair of hip joint 07/08/2012  . Cholelithiasis without obstruction 03/22/2007    Beacher May PT 11/10/2017, 8:55 PM  [facility] PHYSICAL AND SPORTS MEDICINE [address], [place], [address] Phone: [phone]   Fax:  [phone]  Name: Alicia Mccann MRN:  [id number] Date of Birth: Apr 27, 1933

## 2017-11-12 ENCOUNTER — Ambulatory Visit: Payer: Medicare Other | Admitting: Physical Therapy

## 2017-11-16 ENCOUNTER — Ambulatory Visit: Payer: Medicare Other | Admitting: Physical Therapy

## 2017-11-18 ENCOUNTER — Ambulatory Visit: Payer: Medicare Other | Admitting: Physical Therapy

## 2017-11-23 ENCOUNTER — Ambulatory Visit: Payer: Medicare Other | Admitting: Physical Therapy

## 2017-11-25 ENCOUNTER — Encounter: Payer: Self-pay | Admitting: Internal Medicine

## 2017-11-25 ENCOUNTER — Ambulatory Visit: Payer: Medicare Other | Admitting: Internal Medicine

## 2017-11-25 ENCOUNTER — Encounter: Payer: Medicare Other | Admitting: Physical Therapy

## 2017-11-25 DIAGNOSIS — M25561 Pain in right knee: Secondary | ICD-10-CM

## 2017-11-25 DIAGNOSIS — N183 Chronic kidney disease, stage 3 unspecified: Secondary | ICD-10-CM

## 2017-11-25 DIAGNOSIS — M159 Polyosteoarthritis, unspecified: Secondary | ICD-10-CM | POA: Diagnosis not present

## 2017-11-25 DIAGNOSIS — Z01818 Encounter for other preprocedural examination: Secondary | ICD-10-CM

## 2017-11-25 DIAGNOSIS — Z6830 Body mass index (BMI) 30.0-30.9, adult: Secondary | ICD-10-CM

## 2017-11-25 DIAGNOSIS — F419 Anxiety disorder, unspecified: Secondary | ICD-10-CM | POA: Diagnosis not present

## 2017-11-25 DIAGNOSIS — I10 Essential (primary) hypertension: Secondary | ICD-10-CM

## 2017-11-25 DIAGNOSIS — J3489 Other specified disorders of nose and nasal sinuses: Secondary | ICD-10-CM | POA: Diagnosis not present

## 2017-11-25 DIAGNOSIS — I739 Peripheral vascular disease, unspecified: Secondary | ICD-10-CM | POA: Diagnosis not present

## 2017-11-25 DIAGNOSIS — E782 Mixed hyperlipidemia: Secondary | ICD-10-CM

## 2017-11-25 MED ORDER — TRIAMTERENE-HCTZ 37.5-25 MG PO TABS: 1.0000 | ORAL_TABLET | Freq: Every day | ORAL | 5 refills | Status: DC

## 2017-11-25 MED ORDER — MUPIROCIN 2 % EX OINT: 1.0000 "application " | TOPICAL_OINTMENT | Freq: Two times a day (BID) | CUTANEOUS | 0 refills | Status: DC

## 2017-11-25 NOTE — Progress Notes (Signed)
Patient ID: Alicia Mccann, female   DOB: 09-Jul-1933, 84 y.o.   MRN: [id number]   Subjective:    Patient ID: Alicia Mccann, female    DOB: Feb 06, 1933, 84 y.o.   MRN: [id number]  HPI  Patient here for a scheduled follow up.  She reports she is doing relatively well.  Planning for knee surgery 01/04/18.  No chest pain.  Breathing stable.  Due to see Dr Mariah Milling.  Needs a f/u appt. Reports persistent swelling and discomfort right hand.  Arthritis changes.  No acid reflux.  No abdominal pain.  Bowels moving.  No urine change.  Reports persistent intranasal lesion.     Past Medical History:  Diagnosis Date  . Allergic rhinitis   . Anemia   . Anxiety   . Cellulitis   . Cholelithiasis   . Essential hypertension   . History of stress test    a. 12/2008 Ex MV: EF 78%, no ischemia.  . Insomnia   . Osteoarthritis   . Tachycardia   . Valvular heart disease    a. 12/2008 Echo: EF 65%, mild MR;  b. 2/6 SEM RUSB - insignificant murmur, prob Ao Sclerosis.   Past Surgical History:  Procedure Laterality Date  . BACK SURGERY    . BUNIONECTOMY    . CHOLECYSTECTOMY  03/29/2007  . GALLBLADDER SURGERY    . HIP SURGERY     bilateral   . THUMB ARTHROSCOPY     Family History  Problem Relation Age of Onset  . Heart attack Father   . Stroke Father   . Heart disease Father   . CAD Father   . Heart attack Son 82       MI  . Hypertension Son   . Hypertension Mother   . Heart disease Mother   . Stroke Mother   . Breast cancer Neg Hx    Social History   Socioeconomic History  . Marital status: Widowed    Spouse name: Not on file  . Number of children: Not on file  . Years of education: Not on file  . Highest education level: Not on file  Occupational History  . Not on file  Social Needs  . Financial resource strain: Not on file  . Food insecurity:    Worry: Not on file    Inability: Not on file  . Transportation needs:    Medical: Not on file    Non-medical: Not on file  Tobacco  Use  . Smoking status: Never Smoker  . Smokeless tobacco: Never Used  Substance and Sexual Activity  . Alcohol use: No  . Drug use: No  . Sexual activity: Not on file  Lifestyle  . Physical activity:    Days per week: Not on file    Minutes per session: Not on file  . Stress: Not on file  Relationships  . Social connections:    Talks on phone: Not on file    Gets together: Not on file    Attends religious service: Not on file    Active member of club or organization: Not on file    Attends meetings of clubs or organizations: Not on file    Relationship status: Not on file  Other Topics Concern  . Not on file  Social History Narrative  . Not on file    Outpatient Encounter Medications as of 11/25/2017  Medication Sig  . Acetaminophen (EXTRA STRENGTH ACETAMINOPHEN) 500 MG coapsule Take 500 mg by mouth as needed  for fever.  . ALPRAZolam (XANAX) 0.25 MG tablet Take 1 tablet (0.25 mg total) by mouth at bedtime as needed.  Marland Kitchen amLODipine (NORVASC) 2.5 MG tablet TAKE 1 TABLET BY MOUTH DAILY  . aspirin 81 MG tablet Take 81 mg by mouth daily.  . Cyanocobalamin (VITAMIN B 12 PO) Take 1,000 mg by mouth every other day.   . rosuvastatin (CRESTOR) 10 MG tablet TAKE ONE TABLET BY MOUTH EVERY DAY  . [DISCONTINUED] triamterene-hydrochlorothiazide (MAXZIDE) 75-50 MG tablet TAKE 1 TABLET BY MOUTH DAILY  . cholecalciferol (VITAMIN D) 1000 UNITS tablet Take 2,000 Units by mouth every other day.   . mupirocin ointment (BACTROBAN) 2 % Place 1 application into the nose 2 (two) times daily.  Marland Kitchen triamterene-hydrochlorothiazide (MAXZIDE-25) 37.5-25 MG tablet Take 1 tablet by mouth daily.   No facility-administered encounter medications on file as of 11/25/2017.     Review of Systems  Constitutional: Negative for appetite change and unexpected weight change.  HENT: Negative for congestion and sinus pressure.   Respiratory: Negative for cough, chest tightness and shortness of breath.   Cardiovascular:  Negative for chest pain, palpitations and leg swelling.  Gastrointestinal: Negative for abdominal pain, diarrhea, nausea and vomiting.  Genitourinary: Negative for difficulty urinating and dysuria.  Musculoskeletal: Negative for myalgias.       Increased swelling right hand.  Arthritis changes.    Skin: Negative for color change and rash.  Neurological: Negative for dizziness, light-headedness and headaches.  Psychiatric/Behavioral: Negative for agitation and dysphoric mood.       Some increased stress with some family issues.         Objective:     Blood pressure rechecked by me:  130/72  Physical Exam  Constitutional: She appears well-developed and well-nourished. No distress.  HENT:  Nose: Nose normal.  Mouth/Throat: Oropharynx is clear and moist.  Neck: Neck supple. No thyromegaly present.  Cardiovascular: Normal rate and regular rhythm.  Pulmonary/Chest: Breath sounds normal. No respiratory distress. She has no wheezes.  Abdominal: Soft. Bowel sounds are normal. There is no tenderness.  Musculoskeletal: She exhibits no edema or tenderness.  Lymphadenopathy:    She has no cervical adenopathy.  Skin: No rash noted. No erythema.  Psychiatric: She has a normal mood and affect. Her behavior is normal.    BP 130/72   Pulse 78   Temp 98 F (36.7 C) (Oral)   Resp 18   Wt 166 lb 3.2 oz (75.4 kg)   SpO2 95%   BMI 30.40 kg/m  Wt Readings from Last 3 Encounters:  11/25/17 166 lb 3.2 oz (75.4 kg)  10/19/17 168 lb 12.8 oz (76.6 kg)  05/08/17 169 lb 6.4 oz (76.8 kg)     Lab Results  Component Value Date   WBC 10.5 10/19/2017   HGB 12.3 10/19/2017   HCT 37.3 10/19/2017   PLT 315.0 10/19/2017   GLUCOSE 91 10/19/2017   CHOL 179 05/26/2017   TRIG 161.0 (H) 05/26/2017   HDL 41.50 05/26/2017   LDLCALC 105 (H) 05/26/2017   ALT 24 10/19/2017   AST 19 10/19/2017   NA 140 10/19/2017   K 3.9 10/19/2017   CL 104 10/19/2017   CREATININE 1.12 10/19/2017   BUN 27 (H)  10/19/2017   CO2 27 10/19/2017   TSH 1.76 10/19/2017   INR 0.9 01/16/2009    US Renal  Result Date: 08/19/2017 CLINICAL DATA:  84 year old female with stage 3 chronic kidney disease. Initial encounter. EXAM: RENAL / URINARY TRACT ULTRASOUND COMPLETE  COMPARISON:  05/24/2015 CT. FINDINGS: Right Kidney: Length: 11.3 cm. No hydronephrosis or mass. Minimal renal parenchymal thinning. Left Kidney: Length: 10.6 cm. No hydronephrosis or mass. Minimal renal parenchymal thinning. Bladder: Appears normal for degree of bladder distention. IMPRESSION: No hydronephrosis.  Minimal bilateral renal parenchymal thinning. Electronically Signed   By: Lacy DuverneySteven  Olson M.D.   On: 08/19/2017 14:29       Assessment & Plan:   Problem List Items Addressed This Visit    Anxiety    Overall she feels she is handling things relatively well.  Follow.        BMI 30.0-30.9,adult    Discussed diet and exercise.  Follow.        CKD (chronic kidney disease) stage 3, GFR 30-59 ml/min (HCC)    Creatinine 1.12 last check.  Follow.  Avoid antiinflammatories.        Essential hypertension    Blood pressure under good control.  Continue same medication regimen.  Follow pressures.  Follow metabolic panel.        Relevant Medications   triamterene-hydrochlorothiazide (MAXZIDE-25) 37.5-25 MG tablet   Other Relevant Orders   Basic metabolic panel   Mixed hyperlipidemia    On crestor.  Low cholesterol diet and exercise.  Follow lipid panel and liver fucntiont ests.        Relevant Medications   triamterene-hydrochlorothiazide (MAXZIDE-25) 37.5-25 MG tablet   Other Relevant Orders   Hepatic function panel   Lipid panel   Osteoarthritis    Has seen Dr Gavin PottersKernodle previously.  With increased pain and swelling right hand.  Refer back to Dr Gavin PottersKernodle.        Relevant Orders   Ambulatory referral to Rheumatology   PAD (peripheral artery disease) (HCC)    Continue aspirin and crestor.        Relevant Medications    triamterene-hydrochlorothiazide (MAXZIDE-25) 37.5-25 MG tablet   Right knee pain    Persistent right knee pain.  Planning for surgery - 01/04/18.  Seeing Dr Tresa EndoKelly.  She states she  Is due f/u with Dr Mariah MillingGollan.  Will go ahead and schedule with plans for cardiac pre op evaluation prior to her surgery.         Other Visit Diagnoses    Internal nasal lesion       bactroban as directed.  follow.  notify me if persistant and will have ent evaluate.    Pre-op evaluation       Relevant Orders   Ambulatory referral to Cardiology       Dale DurhamSCOTT, Glennda Weatherholtz, MD

## 2017-11-28 ENCOUNTER — Other Ambulatory Visit: Payer: Self-pay | Admitting: Internal Medicine

## 2017-11-28 DIAGNOSIS — M25561 Pain in right knee: Secondary | ICD-10-CM | POA: Insufficient documentation

## 2017-11-28 NOTE — Assessment & Plan Note (Signed)
Persistent right knee pain.  Planning for surgery - 01/04/18.  Seeing Dr Tresa EndoKelly.  She states she  Is due f/u with Dr Mariah MillingGollan.  Will go ahead and schedule with plans for cardiac pre op evaluation prior to her surgery.

## 2017-11-28 NOTE — Assessment & Plan Note (Signed)
Creatinine 1.12 last check.  Follow.  Avoid antiinflammatories.

## 2017-11-28 NOTE — Assessment & Plan Note (Signed)
On crestor.  Low cholesterol diet and exercise.  Follow lipid panel and liver fucntiont ests.

## 2017-11-28 NOTE — Assessment & Plan Note (Signed)
Continue aspirin and crestor.   

## 2017-11-28 NOTE — Assessment & Plan Note (Signed)
Has seen Dr Gavin PottersKernodle previously.  With increased pain and swelling right hand.  Refer back to Dr Gavin PottersKernodle.

## 2017-11-28 NOTE — Assessment & Plan Note (Signed)
Blood pressure under good control.  Continue same medication regimen.  Follow pressures.  Follow metabolic panel.   

## 2017-11-28 NOTE — Assessment & Plan Note (Signed)
Overall she feels she is handling things relatively well.  Follow.  

## 2017-11-28 NOTE — Assessment & Plan Note (Signed)
Discussed diet and exercise.  Follow.  

## 2017-11-30 ENCOUNTER — Encounter: Payer: Medicare Other | Admitting: Physical Therapy

## 2017-12-03 ENCOUNTER — Encounter: Payer: Medicare Other | Admitting: Physical Therapy

## 2017-12-07 ENCOUNTER — Encounter: Payer: Medicare Other | Admitting: Physical Therapy

## 2017-12-09 ENCOUNTER — Encounter: Payer: Medicare Other | Admitting: Physical Therapy

## 2017-12-14 ENCOUNTER — Encounter: Payer: Medicare Other | Admitting: Physical Therapy

## 2017-12-15 ENCOUNTER — Ambulatory Visit: Payer: Medicare Other | Admitting: Cardiovascular Disease

## 2017-12-23 ENCOUNTER — Encounter

## 2017-12-23 ENCOUNTER — Ambulatory Visit: Payer: Medicare Other | Admitting: Cardiovascular Disease

## 2017-12-23 ENCOUNTER — Telehealth: Payer: Self-pay | Admitting: Internal Medicine

## 2017-12-23 NOTE — Telephone Encounter (Signed)
Copied from CRM [id number]. Topic: Inquiry >> Dec 23, 2017  1:33 PM Maia Petties wrote: Reason for CRM: pt states she is scheduled for knee surgery 01/04/18. Pt states she goes to the bathroom 3-4x per night. She said when she had hip surgery that Dr. Elease Hashimoto gave her a prescription to reduce the frequency of urination so she didn't get up as often. Please call pt to advise. OK to leave detailed msg.  [facility] - [place], [place] - [address] [phone] (Phone) [phone] (Fax)

## 2017-12-23 NOTE — Telephone Encounter (Signed)
Please advise 

## 2017-12-23 NOTE — Telephone Encounter (Signed)
See below

## 2017-12-24 NOTE — Telephone Encounter (Signed)
Would like to check a urinalysis to confirm no infection, etc.  Have her come in for urine check.

## 2017-12-25 ENCOUNTER — Other Ambulatory Visit: Payer: Self-pay | Admitting: Internal Medicine

## 2017-12-25 DIAGNOSIS — R350 Frequency of micturition: Secondary | ICD-10-CM

## 2017-12-25 NOTE — Telephone Encounter (Signed)
Pt stated that she just had a UA done on May 1 at [facility] when she was there for her pre-op surgery. Would you still like for her to come into our office and give another sample?

## 2017-12-25 NOTE — Telephone Encounter (Signed)
Will await urine results.

## 2017-12-25 NOTE — Progress Notes (Signed)
Orders placed for urinalysis and culture.

## 2017-12-25 NOTE — Telephone Encounter (Signed)
Pt scheduled to come in for f/u UA but is still persistent about getting something for urinary frequency before her knee surgery

## 2017-12-25 NOTE — Telephone Encounter (Signed)
After reviewing over the urinalysis, I would like for her to come in for urinalysis and culture.

## 2017-12-28 ENCOUNTER — Other Ambulatory Visit: Payer: Self-pay | Admitting: Physician Assistant

## 2017-12-28 ENCOUNTER — Other Ambulatory Visit: Payer: Self-pay | Admitting: Internal Medicine

## 2017-12-28 DIAGNOSIS — F419 Anxiety disorder, unspecified: Secondary | ICD-10-CM

## 2017-12-29 ENCOUNTER — Other Ambulatory Visit (INDEPENDENT_AMBULATORY_CARE_PROVIDER_SITE_OTHER): Payer: Medicare Other

## 2017-12-29 DIAGNOSIS — R350 Frequency of micturition: Secondary | ICD-10-CM

## 2017-12-29 LAB — URINALYSIS, ROUTINE W REFLEX MICROSCOPIC
Bilirubin Urine: NEGATIVE
Ketones, ur: NEGATIVE
Nitrite: NEGATIVE
SPECIFIC GRAVITY, URINE: 1.025 (ref 1.000–1.030)
Total Protein, Urine: NEGATIVE
URINE GLUCOSE: NEGATIVE
Urobilinogen, UA: 0.2 (ref 0.0–1.0)
pH: 5.5 (ref 5.0–8.0)

## 2017-12-29 NOTE — Telephone Encounter (Signed)
Last OV 11/25/2017 Next OV 03/16/2018 Last refill 11/11/2016  (never filled by you)

## 2017-12-30 LAB — URINE CULTURE
MICRO NUMBER:: 90586207
SPECIMEN QUALITY:: ADEQUATE

## 2017-12-30 NOTE — Telephone Encounter (Signed)
rx ok'd for xanax #30 with no refills.   

## 2017-12-31 ENCOUNTER — Other Ambulatory Visit: Payer: Self-pay

## 2017-12-31 MED ORDER — TOLTERODINE TARTRATE 2 MG PO TABS: 2.0000 mg | ORAL_TABLET | Freq: Two times a day (BID) | ORAL | 0 refills | Status: DC

## 2018-01-09 ENCOUNTER — Emergency Department: Payer: Medicare Other

## 2018-01-09 ENCOUNTER — Inpatient Hospital Stay
Admission: EM | Admit: 2018-01-09 | Discharge: 2018-01-11 | DRG: 392 | Disposition: A | Discharge status: Home Health | Payer: Medicare Other | Attending: Internal Medicine | Admitting: Internal Medicine

## 2018-01-09 ENCOUNTER — Other Ambulatory Visit: Payer: Self-pay

## 2018-01-09 DIAGNOSIS — E669 Obesity, unspecified: Secondary | ICD-10-CM | POA: Diagnosis present

## 2018-01-09 DIAGNOSIS — R739 Hyperglycemia, unspecified: Secondary | ICD-10-CM

## 2018-01-09 DIAGNOSIS — Z882 Allergy status to sulfonamides status: Secondary | ICD-10-CM | POA: Diagnosis not present

## 2018-01-09 DIAGNOSIS — Z7951 Long term (current) use of inhaled steroids: Secondary | ICD-10-CM | POA: Diagnosis not present

## 2018-01-09 DIAGNOSIS — Z91013 Allergy to seafood: Secondary | ICD-10-CM | POA: Diagnosis not present

## 2018-01-09 DIAGNOSIS — Z880 Allergy status to penicillin: Secondary | ICD-10-CM

## 2018-01-09 DIAGNOSIS — K529 Noninfective gastroenteritis and colitis, unspecified: Principal | ICD-10-CM | POA: Diagnosis present

## 2018-01-09 DIAGNOSIS — R109 Unspecified abdominal pain: Secondary | ICD-10-CM | POA: Diagnosis present

## 2018-01-09 DIAGNOSIS — F411 Generalized anxiety disorder: Secondary | ICD-10-CM | POA: Diagnosis present

## 2018-01-09 DIAGNOSIS — Z881 Allergy status to other antibiotic agents status: Secondary | ICD-10-CM

## 2018-01-09 DIAGNOSIS — Z6833 Body mass index (BMI) 33.0-33.9, adult: Secondary | ICD-10-CM | POA: Diagnosis not present

## 2018-01-09 DIAGNOSIS — Z91011 Allergy to milk products: Secondary | ICD-10-CM | POA: Diagnosis not present

## 2018-01-09 DIAGNOSIS — Z91012 Allergy to eggs: Secondary | ICD-10-CM

## 2018-01-09 DIAGNOSIS — I10 Essential (primary) hypertension: Secondary | ICD-10-CM | POA: Diagnosis present

## 2018-01-09 DIAGNOSIS — E876 Hypokalemia: Secondary | ICD-10-CM | POA: Diagnosis not present

## 2018-01-09 DIAGNOSIS — K567 Ileus, unspecified: Secondary | ICD-10-CM

## 2018-01-09 DIAGNOSIS — K5909 Other constipation: Secondary | ICD-10-CM

## 2018-01-09 DIAGNOSIS — I38 Endocarditis, valve unspecified: Secondary | ICD-10-CM | POA: Diagnosis present

## 2018-01-09 DIAGNOSIS — Z7982 Long term (current) use of aspirin: Secondary | ICD-10-CM | POA: Diagnosis not present

## 2018-01-09 DIAGNOSIS — Z96651 Presence of right artificial knee joint: Secondary | ICD-10-CM | POA: Diagnosis not present

## 2018-01-09 DIAGNOSIS — E785 Hyperlipidemia, unspecified: Secondary | ICD-10-CM | POA: Diagnosis present

## 2018-01-09 DIAGNOSIS — K5792 Diverticulitis of intestine, part unspecified, without perforation or abscess without bleeding: Secondary | ICD-10-CM | POA: Diagnosis present

## 2018-01-09 LAB — COMPREHENSIVE METABOLIC PANEL
ALBUMIN: 3.4 g/dL — AB (ref 3.5–5.0)
ALT: 27 U/L (ref 14–54)
ANION GAP: 12 (ref 5–15)
AST: 45 U/L — ABNORMAL HIGH (ref 15–41)
Alkaline Phosphatase: 149 U/L — ABNORMAL HIGH (ref 38–126)
BILIRUBIN TOTAL: 1 mg/dL (ref 0.3–1.2)
BUN: 29 mg/dL — ABNORMAL HIGH (ref 6–20)
CO2: 25 mmol/L (ref 22–32)
Calcium: 9.8 mg/dL (ref 8.9–10.3)
Chloride: 98 mmol/L — ABNORMAL LOW (ref 101–111)
Creatinine, Ser: 1.07 mg/dL — ABNORMAL HIGH (ref 0.44–1.00)
GFR calc non Af Amer: 47 mL/min — ABNORMAL LOW (ref >60)
GFR, EST AFRICAN AMERICAN: 54 mL/min — AB (ref >60)
GLUCOSE: 186 mg/dL — AB (ref 65–99)
POTASSIUM: 3.9 mmol/L (ref 3.5–5.1)
Sodium: 135 mmol/L (ref 135–145)
TOTAL PROTEIN: 7.1 g/dL (ref 6.5–8.1)

## 2018-01-09 LAB — CBC
HCT: 36.2 % (ref 35.0–47.0)
Hemoglobin: 12 g/dL (ref 12.0–16.0)
MCH: 29.4 pg (ref 26.0–34.0)
MCHC: 33.1 g/dL (ref 32.0–36.0)
MCV: 89 fL (ref 80.0–100.0)
Platelets: 393 10*3/uL (ref 150–440)
RBC: 4.07 MIL/uL (ref 3.80–5.20)
RDW: 13.2 % (ref 11.5–14.5)
WBC: 28.8 10*3/uL — ABNORMAL HIGH (ref 3.6–11.0)

## 2018-01-09 LAB — GLUCOSE, CAPILLARY
GLUCOSE-CAPILLARY: 149 mg/dL — AB (ref 65–99)
Glucose-Capillary: 122 mg/dL — ABNORMAL HIGH (ref 65–99)

## 2018-01-09 LAB — C-REACTIVE PROTEIN: CRP: 16.3 mg/dL — AB (ref <1.0)

## 2018-01-09 LAB — SEDIMENTATION RATE: SED RATE: 60 mm/h — AB (ref 0–30)

## 2018-01-09 LAB — LACTIC ACID, PLASMA: LACTIC ACID, VENOUS: 1.5 mmol/L (ref 0.5–1.9)

## 2018-01-09 MED ORDER — SODIUM CHLORIDE 0.9 % IV BOLUS
1000.0000 mL | Freq: Once | INTRAVENOUS | Status: AC
  Administered 2018-01-09: 1000 mL via INTRAVENOUS

## 2018-01-09 MED ORDER — OXYBUTYNIN CHLORIDE ER 5 MG PO TB24
5.0000 mg | ORAL_TABLET | Freq: Every day | ORAL | Status: DC
  Administered 2018-01-09 – 2018-01-10 (×2): 5 mg via ORAL
  Filled 2018-01-09 (×2): qty 1

## 2018-01-09 MED ORDER — HEPARIN SODIUM (PORCINE) 5000 UNIT/ML IJ SOLN
5000.0000 [IU] | Freq: Three times a day (TID) | INTRAMUSCULAR | Status: DC
  Administered 2018-01-10 – 2018-01-11 (×3): 5000 [IU] via SUBCUTANEOUS
  Filled 2018-01-09 (×4): qty 1

## 2018-01-09 MED ORDER — ACETAMINOPHEN 650 MG RE SUPP: 650.0000 mg | Freq: Four times a day (QID) | RECTAL | Status: DC | PRN

## 2018-01-09 MED ORDER — IOPAMIDOL (ISOVUE-300) INJECTION 61%
30.0000 mL | Freq: Once | INTRAVENOUS | Status: AC | PRN
  Administered 2018-01-09: 30 mL via ORAL

## 2018-01-09 MED ORDER — MORPHINE SULFATE (PF) 2 MG/ML IV SOLN: 2.0000 mg | INTRAVENOUS | Status: DC | PRN

## 2018-01-09 MED ORDER — ALPRAZOLAM 0.25 MG PO TABS
0.2500 mg | ORAL_TABLET | Freq: Three times a day (TID) | ORAL | Status: DC | PRN
  Administered 2018-01-09: 0.25 mg via ORAL
  Filled 2018-01-09: qty 1

## 2018-01-09 MED ORDER — HYDROCODONE-ACETAMINOPHEN 10-325 MG PO TABS: 1.0000 | ORAL_TABLET | ORAL | Status: DC | PRN

## 2018-01-09 MED ORDER — SODIUM CHLORIDE 0.9 % IV SOLN
INTRAVENOUS | Status: DC
  Administered 2018-01-09 – 2018-01-10 (×2): via INTRAVENOUS

## 2018-01-09 MED ORDER — IPRATROPIUM-ALBUTEROL 0.5-2.5 (3) MG/3ML IN SOLN
3.0000 mL | Freq: Four times a day (QID) | RESPIRATORY_TRACT | Status: DC
Start: 2018-01-09 — End: 2018-01-10
  Administered 2018-01-09 – 2018-01-10 (×4): 3 mL via RESPIRATORY_TRACT
  Filled 2018-01-09 (×4): qty 3

## 2018-01-09 MED ORDER — ASPIRIN 81 MG PO CHEW
81.0000 mg | CHEWABLE_TABLET | Freq: Every day | ORAL | Status: DC
  Administered 2018-01-09 – 2018-01-11 (×3): 81 mg via ORAL
  Filled 2018-01-09 (×3): qty 1

## 2018-01-09 MED ORDER — ACETAMINOPHEN 325 MG PO TABS
650.0000 mg | ORAL_TABLET | Freq: Four times a day (QID) | ORAL | Status: DC | PRN
  Administered 2018-01-09: 650 mg via ORAL
  Filled 2018-01-09: qty 2

## 2018-01-09 MED ORDER — SODIUM CHLORIDE 0.9 % IV BOLUS
1000.0000 mL | Freq: Once | INTRAVENOUS | Status: AC
Start: 2018-01-09 — End: 2018-01-09
  Administered 2018-01-09: 1000 mL via INTRAVENOUS

## 2018-01-09 MED ORDER — ONDANSETRON HCL 4 MG/2ML IJ SOLN
4.0000 mg | Freq: Once | INTRAMUSCULAR | Status: AC
  Administered 2018-01-09: 4 mg via INTRAVENOUS
  Filled 2018-01-09: qty 2

## 2018-01-09 MED ORDER — IOPAMIDOL (ISOVUE-370) INJECTION 76%
75.0000 mL | Freq: Once | INTRAVENOUS | Status: AC | PRN
  Administered 2018-01-09: 75 mL via INTRAVENOUS

## 2018-01-09 MED ORDER — ONDANSETRON HCL 4 MG PO TABS: 4.0000 mg | ORAL_TABLET | Freq: Four times a day (QID) | ORAL | Status: DC | PRN

## 2018-01-09 MED ORDER — SODIUM CHLORIDE 0.9 % IV SOLN
100.0000 mg | Freq: Two times a day (BID) | INTRAVENOUS | Status: DC
  Administered 2018-01-09 – 2018-01-10 (×2): 100 mg via INTRAVENOUS
  Filled 2018-01-09 (×4): qty 100

## 2018-01-09 MED ORDER — AMLODIPINE BESYLATE 5 MG PO TABS
2.5000 mg | ORAL_TABLET | Freq: Every day | ORAL | Status: DC
  Administered 2018-01-09 – 2018-01-11 (×3): 2.5 mg via ORAL
  Filled 2018-01-09 (×3): qty 1

## 2018-01-09 MED ORDER — DOCUSATE SODIUM 100 MG PO CAPS
100.0000 mg | ORAL_CAPSULE | Freq: Two times a day (BID) | ORAL | Status: DC
  Administered 2018-01-10 – 2018-01-11 (×2): 100 mg via ORAL
  Filled 2018-01-09 (×3): qty 1

## 2018-01-09 MED ORDER — ONDANSETRON HCL 4 MG/2ML IJ SOLN: 4.0000 mg | Freq: Four times a day (QID) | INTRAMUSCULAR | Status: DC | PRN

## 2018-01-09 MED ORDER — BISACODYL 10 MG RE SUPP: 10.0000 mg | Freq: Every day | RECTAL | Status: DC | PRN

## 2018-01-09 MED ORDER — MUPIROCIN 2 % EX OINT
1.0000 "application " | TOPICAL_OINTMENT | Freq: Two times a day (BID) | CUTANEOUS | Status: DC | PRN
  Filled 2018-01-09: qty 22

## 2018-01-09 MED ORDER — FAMOTIDINE IN NACL 20-0.9 MG/50ML-% IV SOLN
20.0000 mg | Freq: Two times a day (BID) | INTRAVENOUS | Status: DC
  Administered 2018-01-09 – 2018-01-11 (×4): 20 mg via INTRAVENOUS
  Filled 2018-01-09 (×4): qty 50

## 2018-01-09 MED ORDER — TRIAMTERENE-HCTZ 37.5-25 MG PO TABS
1.0000 | ORAL_TABLET | Freq: Every day | ORAL | Status: DC
  Administered 2018-01-10 – 2018-01-11 (×2): 1 via ORAL
  Filled 2018-01-09 (×3): qty 1

## 2018-01-09 MED ORDER — INSULIN ASPART 100 UNIT/ML ~~LOC~~ SOLN: 0.0000 [IU] | Freq: Three times a day (TID) | SUBCUTANEOUS | Status: DC

## 2018-01-09 MED ORDER — ROSUVASTATIN CALCIUM 10 MG PO TABS
10.0000 mg | ORAL_TABLET | Freq: Every day | ORAL | Status: DC
  Administered 2018-01-09 – 2018-01-10 (×2): 10 mg via ORAL
  Filled 2018-01-09 (×2): qty 1

## 2018-01-09 MED ORDER — ORAL CARE MOUTH RINSE
15.0000 mL | Freq: Two times a day (BID) | OROMUCOSAL | Status: DC
  Administered 2018-01-10: 15 mL via OROMUCOSAL

## 2018-01-09 NOTE — ED Provider Notes (Signed)
went to see patient signed out to me by Dr. Esmeralda Links. Patient is complaining of abdominal pain as well. She says she's been having very small bowel movements daily with a lot of straining. I did a rectal on her she has a lot of old hemorrhoidal tags around the rectum rectal showed very small amount of hard stool in the vault my fingertip came back bloody however there is no bleeding on the outside she is not tender to percussion. She is mildly tender to palpation she has good bowel sounds get some abdominal x-rays. I spoke to Dr. Marin Shutter, orthopedics on-call at [facility] and he said he spoke to the operating surgeon Dr. Tresa Endo Dr. Tresa Endo wants to send the patient out on Bactrim and follow-up as an outpatient. He also wants to get an ultrasound to rule out DVT. I will get the ultrasound but the patient came in tachycardic up to 125 and has had a fever and white count 28,000 and sedimentation rate is 60 and slightly nervous about sending her out this way.Patient's O2 sats off of oxygen are 8990. When she wakes up to go up to 91. Chest x-ray is clear and acute abdominal series shows no air fluid levels or stool in her belly is not quite sure why her belly is hurting since orthopedics is blocking at admitting her to [facility] I will go ahead and CT her chest to make sure there is no PE since she was tachycardic and has a low O2 sat with clear lungs and CT her belly to make sure there is no intra-abdominal process which could account for the high white count and sedimentation rate. I will leave only the possibility of a knee infection to explain her symptoms. CT shows colitis. This is probably what is causing a lot of the patient's symptoms especially as she is now complaining of belly more than the leg. I spoke with Dr. Judithann Sheen who came down and examine her we will put her in the hospital and get her on some IV antibiotics for this.   Arnaldo Natal, MD 01/09/18 [phone]

## 2018-01-09 NOTE — ED Triage Notes (Signed)
Patient to ED for multiple complaints. Had right knee replacement one week ago. Since then states she "can't walk, food doesn't taste the same, states her bowel movements are all black and even her vomit is black and has tomatoes in it from God knows when." Patient assisted to the bathroom where she states she has to go "right now." Patient urinated without bowel movement. Patient continues to state she cannot do anything for herself.  When given instructions on stand and turn to sit on commode she states she can't. Performed with one person assist.

## 2018-01-09 NOTE — ED Notes (Signed)
Attempted to call report RN busy

## 2018-01-09 NOTE — Consult Note (Signed)
Reason for Consult: Fever s/p R TKA 01/04/18 Referring Physician:    HPI: Alicia Mccann is an 84 y.o. female who is s/p R TKA at [facility] on 01/04/18. Pt was recovering at home, receiving Home PT. She began feeling generally ill, tired, and c/o abdominal pain. Pt presented to ED where septic workup was performed due to fever.  Pt denies having new or worsening Right knee pain. No new drainage. No falls.    Past Medical History:  Diagnosis Date  . Allergic rhinitis   . Anemia   . Anxiety   . Cellulitis   . Cholelithiasis   . Essential hypertension   . History of stress test    a. 12/2008 Ex MV: EF 78%, no ischemia.  . Insomnia   . Osteoarthritis   . Tachycardia   . Valvular heart disease    a. 12/2008 Echo: EF 65%, mild MR;  b. 2/6 SEM RUSB - insignificant murmur, prob Ao Sclerosis.    Past Surgical History:  Procedure Laterality Date  . BACK SURGERY    . BUNIONECTOMY    . CHOLECYSTECTOMY  03/29/2007  . GALLBLADDER SURGERY    . HIP SURGERY     bilateral   . REPLACEMENT TOTAL KNEE Right   . THUMB ARTHROSCOPY      Family History  Problem Relation Age of Onset  . Heart attack Father   . Stroke Father   . Heart disease Father   . CAD Father   . Heart attack Son 89       MI  . Hypertension Son   . Hypertension Mother   . Heart disease Mother   . Stroke Mother   . Breast cancer Neg Hx     Social History:  reports that she has never smoked. She has never used smokeless tobacco. She reports that she does not drink alcohol or use drugs.  Allergies:  Allergies  Allergen Reactions  . Albumen, Egg   . Clindamycin/Lincomycin   . Eggs Or Egg-Derived Products   . Flagyl [Metronidazole]   . Levaquin [Levofloxacin]   . Milk-Related Compounds   . Mobic [Meloxicam]     GI issues   . Penicillins   . Shellfish Allergy   . Sulfa Antibiotics   . Wheat Bran     Medications: I have reviewed the patient's current medications.  Results for orders placed or performed during the  hospital encounter of 01/09/18 (from the past 48 hour(s))  CBC     Status: Abnormal   Collection Time: 01/09/18  1:13 AM  Result Value Ref Range   WBC 28.8 (H) 3.6 - 11.0 K/uL   RBC 4.07 3.80 - 5.20 MIL/uL   Hemoglobin 12.0 12.0 - 16.0 g/dL   HCT 36.2 35.0 - 47.0 %   MCV 89.0 80.0 - 100.0 fL   MCH 29.4 26.0 - 34.0 pg   MCHC 33.1 32.0 - 36.0 g/dL   RDW 13.2 11.5 - 14.5 %   Platelets 393 150 - 440 K/uL    Comment: Performed at [facility], [address] [place], [place] [address]  Comprehensive metabolic panel     Status: Abnormal   Collection Time: 01/09/18  1:13 AM  Result Value Ref Range   Sodium 135 135 - 145 mmol/L   Potassium 3.9 3.5 - 5.1 mmol/L   Chloride 98 (L) 101 - 111 mmol/L   CO2 25 22 - 32 mmol/L   Glucose, Bld 186 (H) 65 - 99 mg/dL  BUN 29 (H) 6 - 20 mg/dL   Creatinine, Ser 1.07 (H) 0.44 - 1.00 mg/dL   Calcium 9.8 8.9 - 10.3 mg/dL   Total Protein 7.1 6.5 - 8.1 g/dL   Albumin 3.4 (L) 3.5 - 5.0 g/dL   AST 45 (H) 15 - 41 U/L   ALT 27 14 - 54 U/L   Alkaline Phosphatase 149 (H) 38 - 126 U/L   Total Bilirubin 1.0 0.3 - 1.2 mg/dL   GFR calc non Af Amer 47 (L) >60 mL/min   GFR calc Af Amer 54 (L) >60 mL/min    Comment: (NOTE) The eGFR has been calculated using the CKD EPI equation. This calculation has not been validated in all clinical situations. eGFR's persistently <60 mL/min signify possible Chronic Kidney Disease.    Anion gap 12 5 - 15    Comment: Performed at [facility], [address] [place], [place] [address]  Lactic acid, plasma     Status: None   Collection Time: 01/09/18  5:21 AM  Result Value Ref Range   Lactic Acid, Venous 1.5 0.5 - 1.9 mmol/L    Comment: Performed at [facility], [address] [place], [place] [address]  Sedimentation rate     Status: Abnormal   Collection Time: 01/09/18  7:27 AM  Result Value Ref Range   Sed Rate 60 (H) 0 - 30 mm/hr    Comment: Performed at [facility], [address], [place], [place] [address]    Dg Chest 2 View  Result Date: 01/09/2018 CLINICAL DATA:  Right knee replacement 1 week ago. Since the surgery, patient states she can't walk, food does not taste the stain, bowel movements are black, and vomiting. Shortness of breath. EXAM: CHEST - 2 VIEW COMPARISON:  02/06/2011 FINDINGS: Heart size and pulmonary vascularity are normal. Slight linear fibrosis or atelectasis in the lung bases. No consolidation or airspace disease. No blunting of costophrenic angles. No pneumothorax. Mediastinal contours appear intact. Calcification of the aorta. Degenerative changes in the spine. IMPRESSION: Linear atelectasis or fibrosis in the lung bases. No active consolidation. Electronically Signed   By: Lucienne Capers M.D.   On: 01/09/2018 05:25   Ct Angio Chest Pe W And/or Wo Contrast  Result Date: 01/09/2018 CLINICAL DATA:  Abdominal cramping, constipation. Positive D-dimer level. EXAM: CT ANGIOGRAPHY CHEST WITH CONTRAST TECHNIQUE: Multidetector CT imaging of the chest was performed using the standard protocol during bolus administration of intravenous contrast. Multiplanar CT image reconstructions and MIPs were obtained to evaluate the vascular anatomy. CONTRAST:  77m ISOVUE-370 IOPAMIDOL (ISOVUE-370) INJECTION 76% COMPARISON:  Radiographs of same day. FINDINGS: Cardiovascular: Satisfactory opacification of the pulmonary arteries to the segmental level. No evidence of pulmonary embolism. Normal heart size. No pericardial effusion. Atherosclerosis of thoracic aorta is noted without aneurysm or dissection. Coronary artery calcifications are noted. Mediastinum/Nodes: No enlarged mediastinal, hilar, or axillary lymph nodes. Thyroid gland, trachea, and esophagus demonstrate no significant findings. Lungs/Pleura: No pneumothorax or pleural effusion is noted. 6 mm nodule seen in left lower lobe best seen on image number 54 of series 6. Right lung is clear. Upper Abdomen:  Visualized portion of splenic flexure appears to be dilated. Musculoskeletal: No chest wall abnormality. No acute or significant osseous findings. Review of the MIP images confirms the above findings. IMPRESSION: No definite evidence of pulmonary embolus. Coronary artery calcifications are noted suggesting coronary artery disease. 6 mm nodule seen in left lower lobe. Non-contrast chest CT at 6-12 months is recommended.  If the nodule is stable at time of repeat CT, then future CT at 18-24 months (from today's scan) is considered optional for low-risk patients, but is recommended for high-risk patients. This recommendation follows the consensus statement: Guidelines for Management of Incidental Pulmonary Nodules Detected on CT Images: From the Fleischner Society 2017; Radiology 2017; 284:228-243. Splenic flexure of colon appears to be dilated in the visualized portion of upper abdomen. Colonic inflammation or obstruction cannot be excluded. Aortic Atherosclerosis (ICD10-I70.0). Electronically Signed   By: Marijo Conception, M.D.   On: 01/09/2018 10:56   Ct Abdomen Pelvis W Contrast  Result Date: 01/09/2018 CLINICAL DATA:  Acute generalized abdominal pain. EXAM: CT ABDOMEN AND PELVIS WITH CONTRAST TECHNIQUE: Multidetector CT imaging of the abdomen and pelvis was performed using the standard protocol following bolus administration of intravenous contrast. CONTRAST:  61m ISOVUE-370 IOPAMIDOL (ISOVUE-370) INJECTION 76% COMPARISON:  CT scan of March 24, 2015. FINDINGS: Lower chest: No acute abnormality. Hepatobiliary: No focal liver abnormality is seen. Status post cholecystectomy. No biliary dilatation. Pancreas: Unremarkable. No pancreatic ductal dilatation or surrounding inflammatory changes. Spleen: Normal in size without focal abnormality. Adrenals/Urinary Tract: Adrenal glands appear normal. Small bilateral renal cysts are noted. No hydronephrosis or renal obstruction is noted. No renal or ureteral calculi are  noted. Urinary bladder is unremarkable. Stomach/Bowel: Sigmoid diverticulosis is noted. No small bowel dilatation is noted. Stomach is unremarkable. The appendix is not clearly visualized. Focal wall thickening is seen involving proximal sigmoid colon and distal descending colon concerning for focal colitis. This most likely is inflammatory or infectious in etiology. There is significant dilatation of the more proximal large bowel suggesting some degree of obstruction. Vascular/Lymphatic: Aortic atherosclerosis. No enlarged abdominal or pelvic lymph nodes. Reproductive: Uterus and bilateral adnexa are unremarkable. Other: No abdominal wall hernia or abnormality. No abdominopelvic ascites. Musculoskeletal: Status post bilateral hip arthroplasties. Postsurgical and degenerative changes are noted in the lumbar spine. No acute osseous abnormality is noted. IMPRESSION: Wall thickening is seen involving distal descending colon and proximal sigmoid colon concerning for infectious or inflammatory colitis. There does appear to be dilatation of the more proximal large bowel suggesting some degree of obstruction as a result. Sigmoid diverticulosis is noted, but no definite evidence of inflamed diverticulum are noted. Aortic Atherosclerosis (ICD10-I70.0). Electronically Signed   By: JMarijo Conception M.D.   On: 01/09/2018 11:09   [place]Venous Img Lower Unilateral Right  Result Date: 01/09/2018 CLINICAL DATA:  Pain x5 days EXAM: RIGHT LOWER EXTREMITY VENOUS DOPPLER ULTRASOUND TECHNIQUE: Gray-scale sonography with compression, as well as color and duplex ultrasound, were performed to evaluate the deep venous system from the level of the common femoral vein through the popliteal and proximal calf veins. COMPARISON:  02/06/2011 FINDINGS: Normal compressibility of the common femoral, superficial femoral, and popliteal veins, as well as the proximal calf veins. No filling defects to suggest DVT on grayscale or color Doppler imaging.  Doppler waveforms show normal direction of venous flow, normal respiratory phasicity and response to augmentation. Survey views of the contralateral common femoral vein are unremarkable. IMPRESSION: No evidence of right lower extremity deep vein thrombosis. Electronically Signed   By: DLucrezia EuropeM.D.   On: 01/09/2018 10:13   Dg Knee Complete 4 Views Right  Result Date: 01/09/2018 CLINICAL DATA:  Right knee replacement 1 week ago with multiple complaints since then. EXAM: RIGHT KNEE - COMPLETE 4+ VIEW COMPARISON:  MRI right knee 03/16/2017 FINDINGS: Postoperative right knee replacement. Patella femoral component is present. Components appear  well seated although oblique positioning on the lateral view limits evaluation. Skin clips and soft tissue gas are consistent with recent surgery. No evidence of acute fracture or dislocation. IMPRESSION: Right total knee arthroplasty. Components appear well seated and no acute complication is suggested. Electronically Signed   By: Lucienne Capers M.D.   On: 01/09/2018 05:27   Dg Abdomen Acute W/chest  Result Date: 01/09/2018 CLINICAL DATA:  Fever, acute generalized abdominal pain. EXAM: DG ABDOMEN ACUTE W/ 1V CHEST COMPARISON:  Radiographs of same day. FINDINGS: There is no evidence of dilated bowel loops or free intraperitoneal air. Status post cholecystectomy. Phleboliths are noted in the pelvis. Heart size and mediastinal contours are within normal limits. Both lungs are clear. IMPRESSION: No evidence of bowel obstruction or ileus. No acute cardiopulmonary disease. Electronically Signed   By: Marijo Conception, M.D.   On: 01/09/2018 09:31    ROS: R knee pain. Abdominal pain, nausea. Fever. All remaining ROS tested and were negative.  Blood pressure 138/69, pulse 94, temperature 99.1 F (37.3 C), temperature source Oral, resp. rate (!) 26, height '5\' 1"'  (1.549 m), weight 78.6 kg (173 lb 3.2 oz), SpO2 96 %.   Physical Exam: R knee Postop Aquagel dressing removed  at bedside. Staples in place. Incision clean, dry, intact. No drainage No erythema. No purulence.  Mild diffuse swelling and effusion/Mild diffuse ecchymosis consistent with postop day # 5 appearance s/p R TKA. PROM R Knee 0-90 degrees. No abnormal laxity.  Compartments of thigh/leg soft No calf TTP. Mild R leg swelling.  5/5 PF/DF/EHL motor function RLE. Sensation intact L4-S1 distally RLE. + DP/PT pulses.   Assessment/Plan:  1. Fever.-Likely from Diverticulitis.  2. S/p R TKA 01/04/18  -recommend new Aquagel dressing R Knee.  Leave dressing in place until follow up  -ice/elevate R LE -would recommend PT eval. WBAT RLE.  -Low clinical suspicion for septic knee.  R knee appearance consistent with postop.  Would not recommend aspiration at this point. ESR/CRP elevated due to Diverticulitis and will remain elevated s/p TKA for 4-6 weeks.  Recommend following clinically.  If knee appearance changes or no improvement with antibiotics for her Diverticulitis and clinical suspicion for periprosthetic infection remains, would transfer to [facility] for surgical intervention.  Again, no indications at present time.   Jaymes Graff 01/09/2018, 3:27 PM

## 2018-01-09 NOTE — H&P (Signed)
History and Physical    Alicia Mccann SNK:[id number] DOB: 1932/11/04 DOA: 01/09/2018  Referring physician: Dr. Cinda Quest PCP: Einar Pheasant, MD  Specialists: none  Chief Complaint: abdominal pain  HPI: Alicia Mccann is a 84 y.o. female has a past medical history significant for HTN, HLD, and OA s/p knee surgery last week now with abdominal pain with fever and nausea. In ER, WBC=28K. Febrile and tachycardic. ESR elevated. CT shows colonic inflammation c/w diverticulitis. She is now admitted. Nauseated but no vomiting or diarrhea. Denies CP or SOB.  Review of Systems: The patient denies  weight loss,, vision loss, decreased hearing, hoarseness, chest pain, syncope, dyspnea on exertion, peripheral edema, balance deficits, hemoptysis,  melena, hematochezia, severe indigestion/heartburn, hematuria, incontinence, genital sores, muscle weakness, suspicious skin lesions, transient blindness, difficulty walking, depression, unusual weight change, abnormal bleeding, enlarged lymph nodes, angioedema, and breast masses.   Past Medical History:  Diagnosis Date  . Allergic rhinitis   . Anemia   . Anxiety   . Cellulitis   . Cholelithiasis   . Essential hypertension   . History of stress test    a. 12/2008 Ex MV: EF 78%, no ischemia.  . Insomnia   . Osteoarthritis   . Tachycardia   . Valvular heart disease    a. 12/2008 Echo: EF 65%, mild MR;  b. 2/6 SEM RUSB - insignificant murmur, prob Ao Sclerosis.   Past Surgical History:  Procedure Laterality Date  . BACK SURGERY    . BUNIONECTOMY    . CHOLECYSTECTOMY  03/29/2007  . GALLBLADDER SURGERY    . HIP SURGERY     bilateral   . REPLACEMENT TOTAL KNEE Right   . THUMB ARTHROSCOPY     Social History:  reports that she has never smoked. She has never used smokeless tobacco. She reports that she does not drink alcohol or use drugs.  Allergies  Allergen Reactions  . Albumen, Egg   . Clindamycin/Lincomycin   . Eggs Or Egg-Derived  Products   . Flagyl [Metronidazole]   . Levaquin [Levofloxacin]   . Milk-Related Compounds   . Mobic [Meloxicam]     GI issues   . Penicillins   . Shellfish Allergy   . Sulfa Antibiotics   . Wheat Bran     Family History  Problem Relation Age of Onset  . Heart attack Father   . Stroke Father   . Heart disease Father   . CAD Father   . Heart attack Son 74       MI  . Hypertension Son   . Hypertension Mother   . Heart disease Mother   . Stroke Mother   . Breast cancer Neg Hx     Prior to Admission medications   Medication Sig Start Date End Date Taking? Authorizing Provider  Acetaminophen (EXTRA STRENGTH ACETAMINOPHEN) 500 MG coapsule Take 500 mg by mouth as needed for fever.    [provider]  ALPRAZolam Duanne Moron) 0.25 MG tablet TAKE ONE TABLET AT BEDTIME AS NEEDED 12/30/17   Einar Pheasant, MD  amLODipine (NORVASC) 2.5 MG tablet TAKE 1 TABLET BY MOUTH DAILY 09/23/17   Mar Daring, PA-C  aspirin 81 MG tablet Take 81 mg by mouth daily.    [provider]  cholecalciferol (VITAMIN D) 1000 UNITS tablet Take 2,000 Units by mouth every other day.     [provider]  Cyanocobalamin (VITAMIN B 12 PO) Take 1,000 mg by mouth every other day.     [provider]  mupirocin ointment (BACTROBAN) 2 % Place 1 application into the nose 2 (two) times daily. 11/25/17   Einar Pheasant, MD  rosuvastatin (CRESTOR) 10 MG tablet TAKE 1 TABLET BY MOUTH DAILY 11/30/17   Einar Pheasant, MD  tolterodine (DETROL) 2 MG tablet Take 1 tablet (2 mg total) by mouth 2 (two) times daily. 12/31/17   Einar Pheasant, MD  triamterene-hydrochlorothiazide (MAXZIDE-25) 37.5-25 MG tablet Take 1 tablet by mouth daily. 11/25/17   Einar Pheasant, MD   Physical Exam: Vitals:   01/09/18 0845 01/09/18 0900 01/09/18 0930 01/09/18 1000  BP:  (!) 151/60 139/65 (!) 157/66  Pulse: (!) 103 (!) 103 (!) 105 (!) 103  Resp: _0 Temp:      TempSrc:      SpO2: 92% 90% 99% 98%   Weight:      Height:         General:  No apparent distress, WDWN, [place]/AT  Eyes: PERRL, EOMI, no scleral icterus, conjunctiva clear  ENT: moist oropharynx without exudate, TM's benign, dentition good  Neck: supple, no lymphadenopathy. No Bruits or thyromegaly  Cardiovascular: regular rate without MRG; 2+ peripheral pulses, no JVD, trace peripheral edema  Respiratory:  good air movement without wheezing or crackles. Basilar rhonchi noted. No dullness. Respiratory effort normal  Abdomen: soft,  tender to palpation, positive bowel sounds, no guarding, no rebound  Skin: no rashes or lesions  Musculoskeletal: normal bulk and tone, no joint swelling  Psychiatric: normal mood and affect, A&OX 3  Neurologic: CN 2-12 grossly intact, Motor strength 5/5 in all 4 groups with symmetric DTR's and non-focal sensory exam  Labs on Admission:  Basic Metabolic Panel: Recent Labs  Lab 01/09/18 0113  NA 135  K 3.9  CL 98*  CO2 25  GLUCOSE 186*  BUN 29*  CREATININE 1.07*  CALCIUM 9.8   Liver Function Tests: Recent Labs  Lab 01/09/18 0113  AST 45*  ALT 27  ALKPHOS 149*  BILITOT 1.0  PROT 7.1  ALBUMIN 3.4*   No results for input(s): LIPASE, AMYLASE in the last 168 hours. No results for input(s): AMMONIA in the last 168 hours. CBC: Recent Labs  Lab 01/09/18 0113  WBC 28.8*  HGB 12.0  HCT 36.2  MCV 89.0  PLT 393   Cardiac Enzymes: No results for input(s): CKTOTAL, CKMB, CKMBINDEX, TROPONINI in the last 168 hours.  BNP (last 3 results) No results for input(s): BNP in the last 8760 hours.  ProBNP (last 3 results) No results for input(s): PROBNP in the last 8760 hours.  CBG: No results for input(s): GLUCAP in the last 168 hours.  Radiological Exams on Admission: Dg Chest 2 View  Result Date: 01/09/2018 CLINICAL DATA:  Right knee replacement 1 week ago. Since the surgery, patient states she can't walk, food does not taste the stain, bowel movements are black, and  vomiting. Shortness of breath. EXAM: CHEST - 2 VIEW COMPARISON:  02/06/2011 FINDINGS: Heart size and pulmonary vascularity are normal. Slight linear fibrosis or atelectasis in the lung bases. No consolidation or airspace disease. No blunting of costophrenic angles. No pneumothorax. Mediastinal contours appear intact. Calcification of the aorta. Degenerative changes in the spine. IMPRESSION: Linear atelectasis or fibrosis in the lung bases. No active consolidation. Electronically Signed   By: Lucienne Capers M.D.   On: 01/09/2018 05:25   Ct Angio Chest Pe W And/or Wo Contrast  Result Date: 01/09/2018 CLINICAL DATA:  Abdominal cramping, constipation. Positive D-dimer level. EXAM: CT  ANGIOGRAPHY CHEST WITH CONTRAST TECHNIQUE: Multidetector CT imaging of the chest was performed using the standard protocol during bolus administration of intravenous contrast. Multiplanar CT image reconstructions and MIPs were obtained to evaluate the vascular anatomy. CONTRAST:  28m ISOVUE-370 IOPAMIDOL (ISOVUE-370) INJECTION 76% COMPARISON:  Radiographs of same day. FINDINGS: Cardiovascular: Satisfactory opacification of the pulmonary arteries to the segmental level. No evidence of pulmonary embolism. Normal heart size. No pericardial effusion. Atherosclerosis of thoracic aorta is noted without aneurysm or dissection. Coronary artery calcifications are noted. Mediastinum/Nodes: No enlarged mediastinal, hilar, or axillary lymph nodes. Thyroid gland, trachea, and esophagus demonstrate no significant findings. Lungs/Pleura: No pneumothorax or pleural effusion is noted. 6 mm nodule seen in left lower lobe best seen on image number 54 of series 6. Right lung is clear. Upper Abdomen: Visualized portion of splenic flexure appears to be dilated. Musculoskeletal: No chest wall abnormality. No acute or significant osseous findings. Review of the MIP images confirms the above findings. IMPRESSION: No definite evidence of pulmonary embolus.  Coronary artery calcifications are noted suggesting coronary artery disease. 6 mm nodule seen in left lower lobe. Non-contrast chest CT at 6-12 months is recommended. If the nodule is stable at time of repeat CT, then future CT at 18-24 months (from today's scan) is considered optional for low-risk patients, but is recommended for high-risk patients. This recommendation follows the consensus statement: Guidelines for Management of Incidental Pulmonary Nodules Detected on CT Images: From the Fleischner Society 2017; Radiology 2017; 284:228-243. Splenic flexure of colon appears to be dilated in the visualized portion of upper abdomen. Colonic inflammation or obstruction cannot be excluded. Aortic Atherosclerosis (ICD10-I70.0). Electronically Signed   By: JMarijo Conception M.D.   On: 01/09/2018 10:56   Ct Abdomen Pelvis W Contrast  Result Date: 01/09/2018 CLINICAL DATA:  Acute generalized abdominal pain. EXAM: CT ABDOMEN AND PELVIS WITH CONTRAST TECHNIQUE: Multidetector CT imaging of the abdomen and pelvis was performed using the standard protocol following bolus administration of intravenous contrast. CONTRAST:  742mISOVUE-370 IOPAMIDOL (ISOVUE-370) INJECTION 76% COMPARISON:  CT scan of March 24, 2015. FINDINGS: Lower chest: No acute abnormality. Hepatobiliary: No focal liver abnormality is seen. Status post cholecystectomy. No biliary dilatation. Pancreas: Unremarkable. No pancreatic ductal dilatation or surrounding inflammatory changes. Spleen: Normal in size without focal abnormality. Adrenals/Urinary Tract: Adrenal glands appear normal. Small bilateral renal cysts are noted. No hydronephrosis or renal obstruction is noted. No renal or ureteral calculi are noted. Urinary bladder is unremarkable. Stomach/Bowel: Sigmoid diverticulosis is noted. No small bowel dilatation is noted. Stomach is unremarkable. The appendix is not clearly visualized. Focal wall thickening is seen involving proximal sigmoid colon and  distal descending colon concerning for focal colitis. This most likely is inflammatory or infectious in etiology. There is significant dilatation of the more proximal large bowel suggesting some degree of obstruction. Vascular/Lymphatic: Aortic atherosclerosis. No enlarged abdominal or pelvic lymph nodes. Reproductive: Uterus and bilateral adnexa are unremarkable. Other: No abdominal wall hernia or abnormality. No abdominopelvic ascites. Musculoskeletal: Status post bilateral hip arthroplasties. Postsurgical and degenerative changes are noted in the lumbar spine. No acute osseous abnormality is noted. IMPRESSION: Wall thickening is seen involving distal descending colon and proximal sigmoid colon concerning for infectious or inflammatory colitis. There does appear to be dilatation of the more proximal large bowel suggesting some degree of obstruction as a result. Sigmoid diverticulosis is noted, but no definite evidence of inflamed diverticulum are noted. Aortic Atherosclerosis (ICD10-I70.0). Electronically Signed   By: JaMarijo ConceptionM.D.  On: 01/09/2018 11:09   US Venous Img Lower Unilateral Right  Result Date: 01/09/2018 CLINICAL DATA:  Pain x5 days EXAM: RIGHT LOWER EXTREMITY VENOUS DOPPLER ULTRASOUND TECHNIQUE: Gray-scale sonography with compression, as well as color and duplex ultrasound, were performed to evaluate the deep venous system from the level of the common femoral vein through the popliteal and proximal calf veins. COMPARISON:  02/06/2011 FINDINGS: Normal compressibility of the common femoral, superficial femoral, and popliteal veins, as well as the proximal calf veins. No filling defects to suggest DVT on grayscale or color Doppler imaging. Doppler waveforms show normal direction of venous flow, normal respiratory phasicity and response to augmentation. Survey views of the contralateral common femoral vein are unremarkable. IMPRESSION: No evidence of right lower extremity deep vein  thrombosis. Electronically Signed   By: Lucrezia Europe M.D.   On: 01/09/2018 10:13   Dg Knee Complete 4 Views Right  Result Date: 01/09/2018 CLINICAL DATA:  Right knee replacement 1 week ago with multiple complaints since then. EXAM: RIGHT KNEE - COMPLETE 4+ VIEW COMPARISON:  MRI right knee 03/16/2017 FINDINGS: Postoperative right knee replacement. Patella femoral component is present. Components appear well seated although oblique positioning on the lateral view limits evaluation. Skin clips and soft tissue gas are consistent with recent surgery. No evidence of acute fracture or dislocation. IMPRESSION: Right total knee arthroplasty. Components appear well seated and no acute complication is suggested. Electronically Signed   By: Lucienne Capers M.D.   On: 01/09/2018 05:27   Dg Abdomen Acute W/chest  Result Date: 01/09/2018 CLINICAL DATA:  Fever, acute generalized abdominal pain. EXAM: DG ABDOMEN ACUTE W/ 1V CHEST COMPARISON:  Radiographs of same day. FINDINGS: There is no evidence of dilated bowel loops or free intraperitoneal air. Status post cholecystectomy. Phleboliths are noted in the pelvis. Heart size and mediastinal contours are within normal limits. Both lungs are clear. IMPRESSION: No evidence of bowel obstruction or ileus. No acute cardiopulmonary disease. Electronically Signed   By: Marijo Conception, M.D.   On: 01/09/2018 09:31    EKG: Independently reviewed.  Assessment/Plan Principal Problem:   Abdominal pain Active Problems:   Diverticulitis   Valvular heart disease   Hyperglycemia   Will admit to floor with IV fluids and IV ABX. Follow sugars. Consult GI and Ortho. Repeat labs in AM. Begin SVN's. Wean O2 as tolerated  Diet: clear liquids Fluids: NS_0  DVT Prophylaxis: TED hose, SQ Heparin  Code Status: FULL  Family Communication: yes  Disposition Plan: home  Time spent: 50 min

## 2018-01-09 NOTE — Plan of Care (Signed)
Pt on clear liquid diet. Provided education related to same. Denies questions.

## 2018-01-09 NOTE — Evaluation (Signed)
Physical Therapy Evaluation Patient Details Name: Alicia Mccann MRN: [id number] DOB: May 13, 1933 Today's Date: 01/09/2018   History of Present Illness  Pt is an 84 y.o.female whohas a past medical history significant forHTN, HLD, and OA s/p R TKA around 1 wk ago now with abdominal pain with fever and nausea. In ER, WBC=28K. Febrile and tachycardic. ESR elevated. CT shows colonic inflammation c/w diverticulitis. She is now admitted. Nauseated but no vomiting or diarrhea. Denies CP or SOB.  Assessment includes: abdominal pain with diverticulitis, valvular heart disease, and hyperglycemia with recent R TKR.      Clinical Impression  Pt presents with deficits in strength, transfers, mobility, gait, balance, R knee ROM, and activity tolerance.  Pt required min A for bed mobility tasks and sit to/from stand transfers with verbal and tactile cues for sequencing.  Pt ambulated 1 x 5' and 1 x 30' with a RW and CGA with slow cadence and decreased stance time on the RLE but was steady without LOB.  Pt was limited during the session secondary to abdominal pain with nursing aware.  Pt will benefit from continuing with her HHPT services that she was receiving prior to admission along with her 24/7 PCA to safely address above deficits for decreased caregiver assistance and eventual return to PLOF.      Follow Up Recommendations Home health PT;Supervision for mobility/OOB;Other (comment)(Pt was currently receiving HHPT after discharge home from [facility])    Equipment Recommendations  Rolling walker with 5" wheels    Recommendations for Other Services       Precautions / Restrictions Precautions Precautions: Knee;Fall Restrictions Weight Bearing Restrictions: Yes RLE Weight Bearing: Weight bearing as tolerated      Mobility  Bed Mobility Overal bed mobility: Needs Assistance Bed Mobility: Supine to Sit;Sit to Supine     Supine to sit: Min assist Sit to supine: Min assist   General bed  mobility comments: Min A for RLE in and out of bed along with extra time and effort  Transfers Overall transfer level: Needs assistance Equipment used: Rolling walker (2 wheeled) Transfers: Sit to/from Stand Sit to Stand: Min assist         General transfer comment: Min A with transfers with mod verbal and tactile cues for proper sequencing  Ambulation/Gait Ambulation/Gait assistance: Min guard Ambulation Distance (Feet): 20 Feet Assistive device: Rolling walker (2 wheeled) Gait Pattern/deviations: Step-through pattern;Decreased step length - left;Decreased stance time - right;Antalgic Gait velocity: decreased   General Gait Details: Mildly antalgic gait pattern on the RLE but steady without LOB  Stairs Stairs: (deferred)          Wheelchair Mobility    Modified Rankin (Stroke Patients Only)       Balance Overall balance assessment: No apparent balance deficits (not formally assessed)                                           Pertinent Vitals/Pain Pain Assessment: 0-10 Pain Score: 2  Pain Location: R knee Pain Descriptors / Indicators: Sore Pain Intervention(s): Monitored during session    Home Living Family/patient expects to be discharged to:: Private residence Living Arrangements: Alone Available Help at Discharge: Personal care attendant;Available 24 hours/day Type of Home: House Home Access: Stairs to enter Entrance Stairs-Rails: Right Entrance Stairs-Number of Steps: 3 Home Layout: One level Home Equipment: Walker - 4 wheels;Crutches;Cane - single point  Prior Function Level of Independence: Needs assistance   Gait / Transfers Assistance Needed: Since recent R TKR pt has had a PCA 24/7 with some assistance with bed mobility, transfers, and amb required  ADL's / Homemaking Assistance Needed: PCA assists with ADLs since recent R TKR  Comments: Prior to recent TKR Ind with amb without AD limited community distances, no fall  history, Ind with ADLs     Hand Dominance   Dominant Hand: Right    Extremity/Trunk Assessment        Lower Extremity Assessment Lower Extremity Assessment: Generalized weakness;RLE deficits/detail RLE Deficits / Details: R hip flex 2+/5, R knee flex/ext NT secondary to acute surgery       Communication   Communication: No difficulties  Cognition Arousal/Alertness: Awake/alert Behavior During Therapy: WFL for tasks assessed/performed Overall Cognitive Status: Within Functional Limits for tasks assessed                                        General Comments      Exercises Total Joint Exercises Quad Sets: Strengthening;Both;10 reps Hip ABduction/ADduction: AAROM;Right;5 reps Straight Leg Raises: AAROM;Right;5 reps Long Arc Quad: AROM;Both;5 reps;10 reps Knee Flexion: AROM;Both;5 reps;10 reps Goniometric ROM: R knee flex AROM grossly 80 deg with ext NT secondary to pt toileting needs at end of session Marching in Standing: AROM;Both;5 reps Other Exercises Other Exercises: Positioning education to pt and nursing to encourage R knee ext in bed and chair including low bed locked in flat position Other Exercises: Pt encouraged to perform R knee QS and LAQ/seated knee flex x 10 each 5-6x/day   Assessment/Plan    PT Assessment Patient needs continued PT services  PT Problem List Decreased strength;Decreased range of motion;Decreased activity tolerance;Decreased mobility;Decreased knowledge of use of DME       PT Treatment Interventions DME instruction;Gait training;Stair training;Functional mobility training;Therapeutic exercise;Therapeutic activities;Patient/family education    PT Goals (Current goals can be found in the Care Plan section)  Acute Rehab PT Goals Patient Stated Goal: To walk better PT Goal Formulation: With patient Time For Goal Achievement: 01/22/18 Potential to Achieve Goals: Good    Frequency Min 2X/week   Barriers to discharge         Co-evaluation               AM-PAC PT "6 Clicks" Daily Activity  Outcome Measure Difficulty turning over in bed (including adjusting bedclothes, sheets and blankets)?: Unable Difficulty moving from lying on back to sitting on the side of the bed? : Unable Difficulty sitting down on and standing up from a chair with arms (e.g., wheelchair, bedside commode, etc,.)?: Unable Help needed moving to and from a bed to chair (including a wheelchair)?: A Little Help needed walking in hospital room?: A Little Help needed climbing 3-5 steps with a railing? : A Little 6 Click Score: 12    End of Session Equipment Utilized During Treatment: Gait belt;Oxygen Activity Tolerance: Other (comment)(Pt limited by stomach pain and need for bowel movement) Patient left: Other (comment)(Pt on [facility] with CNA) Nurse Communication: Mobility status PT Visit Diagnosis: Other abnormalities of gait and mobility (R26.89);Muscle weakness (generalized) (M62.81)    Time: 8144-8185 PT Time Calculation (min) (ACUTE ONLY): 39 min   Charges:   PT Evaluation $PT Eval Low Complexity: 1 Low PT Treatments $Therapeutic Exercise: 8-22 mins   PT G Codes:  Linus Salmons PT, DPT 01/09/18, 4:50 PM

## 2018-01-09 NOTE — ED Provider Notes (Signed)
[facility] Emergency Department Provider Note  ____________________________________________   First MD Initiated Contact with Patient 01/09/18 [phone]     (approximate)  I have reviewed the triage vital signs and the nursing notes.   HISTORY  Chief Complaint Post-op Problem (Doesn't feel good)   HPI Alicia Mccann is a 84 y.o. female who self presents to the emergency department with multiple issues.  She had a total knee replacement on Monday at [facility] by Dr. Claiborne Billings and ever since then he has had generalized malaise and fatigue.  She has had a fever to 101 degrees at home along with some cramping abdominal pain and constipation.  She does report some discomfort in her right knee and difficulty ambulating.  She denies chest pain or shortness of breath.  She denies new trauma.  Her symptoms been gradual onset slowly progressive are now moderate in severity.  The pain is nonradiating.  Past Medical History:  Diagnosis Date  . Allergic rhinitis   . Anemia   . Anxiety   . Cellulitis   . Cholelithiasis   . Essential hypertension   . History of stress test    a. 12/2008 Ex MV: EF 78%, no ischemia.  . Insomnia   . Osteoarthritis   . Tachycardia   . Valvular heart disease    a. 12/2008 Echo: EF 65%, mild MR;  b. 2/6 SEM RUSB - insignificant murmur, prob Ao Sclerosis.    Patient Active Problem List   Diagnosis Date Noted  . Right knee pain 11/28/2017  . Light headedness 10/21/2017  . Healthcare maintenance 06/25/2017  . CKD (chronic kidney disease) stage 3, GFR 30-59 ml/min (HCC) 06/25/2017  . Malaise and fatigue 01/05/2017  . PAD (peripheral artery disease) ([place]) 05/19/2016  . Valvular heart disease   . Diverticulitis 05/25/2015  . Abdominal pain 05/24/2015  . Allergic rhinitis 04/30/2015  . Absolute anemia 04/30/2015  . Anxiety 04/30/2015  . BMI 30.0-30.9,adult 04/30/2015  . Carpal tunnel syndrome 04/30/2015  . Chronic LBP 04/30/2015  . Edema  extremities 04/30/2015  . Cannot sleep 04/30/2015  . Arthritis sicca 04/30/2015  . Seasonal affective disorder ([place]) 04/30/2015  . Heart murmur 06/27/2013  . Essential hypertension 06/27/2013  . Osteoarthritis 06/27/2013  . Mixed hyperlipidemia 06/27/2013  . History of repair of hip joint 07/08/2012  . Cholelithiasis without obstruction 03/22/2007    Past Surgical History:  Procedure Laterality Date  . BACK SURGERY    . BUNIONECTOMY    . CHOLECYSTECTOMY  03/29/2007  . GALLBLADDER SURGERY    . HIP SURGERY     bilateral   . REPLACEMENT TOTAL KNEE Right   . THUMB ARTHROSCOPY      Prior to Admission medications   Medication Sig Start Date End Date Taking? Authorizing Provider  Acetaminophen (EXTRA STRENGTH ACETAMINOPHEN) 500 MG coapsule Take 500 mg by mouth as needed for fever.    [provider]  ALPRAZolam Duanne Moron) 0.25 MG tablet TAKE ONE TABLET AT BEDTIME AS NEEDED 12/30/17   Einar Pheasant, MD  amLODipine (NORVASC) 2.5 MG tablet TAKE 1 TABLET BY MOUTH DAILY 09/23/17   Mar Daring, PA-C  aspirin 81 MG tablet Take 81 mg by mouth daily.    [provider]  cholecalciferol (VITAMIN D) 1000 UNITS tablet Take 2,000 Units by mouth every other day.     [provider]  Cyanocobalamin (VITAMIN B 12 PO) Take 1,000 mg by mouth every other day.     [provider]  mupirocin  ointment (BACTROBAN) 2 % Place 1 application into the nose 2 (two) times daily. 11/25/17   Einar Pheasant, MD  rosuvastatin (CRESTOR) 10 MG tablet TAKE 1 TABLET BY MOUTH DAILY 11/30/17   Einar Pheasant, MD  tolterodine (DETROL) 2 MG tablet Take 1 tablet (2 mg total) by mouth 2 (two) times daily. 12/31/17   Einar Pheasant, MD  triamterene-hydrochlorothiazide (MAXZIDE-25) 37.5-25 MG tablet Take 1 tablet by mouth daily. 11/25/17   Einar Pheasant, MD    Allergies Albumen, egg; Clindamycin/lincomycin; Eggs or egg-derived products; Flagyl [metronidazole]; Levaquin [levofloxacin];  Milk-related compounds; Mobic [meloxicam]; Penicillins; Shellfish allergy; Sulfa antibiotics; and Wheat bran  Family History  Problem Relation Age of Onset  . Heart attack Father   . Stroke Father   . Heart disease Father   . CAD Father   . Heart attack Son 74       MI  . Hypertension Son   . Hypertension Mother   . Heart disease Mother   . Stroke Mother   . Breast cancer Neg Hx     Social History Social History   Tobacco Use  . Smoking status: Never Smoker  . Smokeless tobacco: Never Used  Substance Use Topics  . Alcohol use: No  . Drug use: No    Review of Systems Constitutional: Positive for fevers Eyes: No visual changes. ENT: No sore throat. Cardiovascular: Denies chest pain. Respiratory: Denies shortness of breath. Gastrointestinal: Positive for abdominal pain.  Positive for nausea, positive for vomiting.  No diarrhea.  Positive for constipation. Genitourinary: Negative for dysuria. Musculoskeletal: Negative for back pain. Skin: Negative for rash. Neurological: Negative for headaches, focal weakness or numbness.   ____________________________________________   PHYSICAL EXAM:  VITAL SIGNS: ED Triage Vitals  Enc Vitals Group     BP 01/09/18 0105 (!) 157/68     Pulse Rate 01/09/18 0105 (!) 125     Resp 01/09/18 0105 18     Temp 01/09/18 0105 98.8 F (37.1 C)     Temp Source 01/09/18 0105 Oral     SpO2 01/09/18 0105 94 %     Weight 01/09/18 0106 169 lb (76.7 kg)     Height 01/09/18 0106 '5\' 2"'  (1.575 m)     Head Circumference --      Peak Flow --      Pain Score 01/09/18 0106 6     Pain Loc --      Pain Edu? --      Excl. in [place]? --     Constitutional: Alert and oriented x4 appears somewhat uncomfortable nontoxic no diaphoresis speaks in full clear sentences Eyes: PERRL EOMI. Head: Atraumatic. Nose: No congestion/rhinnorhea. Mouth/Throat: No trismus Neck: No stridor.   Cardiovascular: Tachycardic she rate, regular rhythm. Grossly normal heart  sounds.  Good peripheral circulation. Respiratory: Normal respiratory effort.  No retractions. Lungs CTAB and moving good air Gastrointestinal: Soft nontender Musculoskeletal: Right knee erythematous, warm, with effusion.  No discharge from the surgical sites Neurologic:  Normal speech and language. No gross focal neurologic deficits are appreciated. Skin:  Skin is warm, dry and intact. No rash noted. Psychiatric: Mood and affect are normal. Speech and behavior are normal.    ____________________________________________   DIFFERENTIAL includes but not limited to  Septic joint, postoperative infection, cellulitis, pneumonia ____________________________________________   LABS (all labs ordered are listed, but only abnormal results are displayed)  Labs Reviewed  CBC - Abnormal; Notable for the following components:      Result Value   WBC  28.8 (*)    All other components within normal limits  COMPREHENSIVE METABOLIC PANEL - Abnormal; Notable for the following components:   Chloride 98 (*)    Glucose, Bld 186 (*)    BUN 29 (*)    Creatinine, Ser 1.07 (*)    Albumin 3.4 (*)    AST 45 (*)    Alkaline Phosphatase 149 (*)    GFR calc non Af Amer 47 (*)    GFR calc Af Amer 54 (*)    All other components within normal limits    Lab work reviewed by me with elevated white count which is concerning for infection __________________________________________  EKG   ____________________________________________  RADIOLOGY   Right knee x-ray reviewed by me with no acute disease Chest x-ray reviewed by me with mild atelectasis ____________________________________________   PROCEDURES  Procedure(s) performed: no  Procedures  Critical Care performed: no  Observation: no ____________________________________________   INITIAL IMPRESSION / ASSESSMENT AND PLAN / ED COURSE  Pertinent labs & imaging results that were available during my care of the patient were reviewed by me  and considered in my medical decision making (see chart for details).  The patient comes to the emergency department uncomfortable appearing, tachycardic, with a swollen red right knee in the immediate postoperative period.  She is able to range the knee although with some discomfort.  She also seems to report abdominal pain constipation and nausea as well as an episode of emesis.  Unclear etiology of her fever tachycardia however I have to be concerned about a postoperative infection.  I am adding on an x-ray of her knee and chest x-ray and then we will reach out to Dr. Claiborne Billings at [facility] the patient's orthopedic surgeon.    ----------------------------------------- 7:15 AM on 01/09/2018 ----------------------------------------- I discussed with on-call orthopedic surgeon at [facility] Dr. Thelma Comp who recommends checking a C-reactive protein along with an ESR and deferring antibiotics at this time.  Once those labs are back he requests a call back to consider transfer.  I have signed out care of the case to my colleague Dr. Cinda Quest who will follow up on these results.   Clinical Course as of Jan 10 800  Sat Jan 09, 2018  0736 C-reactive protein [PM]    Clinical Course User Index [PM] Nena Polio, MD    ____________________________________________   FINAL CLINICAL IMPRESSION(S) / ED DIAGNOSES  Final diagnoses:  None      NEW MEDICATIONS STARTED DURING THIS VISIT:  New Prescriptions   No medications on file     Note:  This document was prepared using Dragon voice recognition software and may include unintentional dictation errors.     Darel Hong, MD 01/09/18 [phone]

## 2018-01-10 DIAGNOSIS — R109 Unspecified abdominal pain: Secondary | ICD-10-CM | POA: Diagnosis not present

## 2018-01-10 DIAGNOSIS — D72829 Elevated white blood cell count, unspecified: Secondary | ICD-10-CM

## 2018-01-10 DIAGNOSIS — K5289 Other specified noninfective gastroenteritis and colitis: Secondary | ICD-10-CM

## 2018-01-10 DIAGNOSIS — K5909 Other constipation: Secondary | ICD-10-CM

## 2018-01-10 DIAGNOSIS — K529 Noninfective gastroenteritis and colitis, unspecified: Principal | ICD-10-CM

## 2018-01-10 LAB — COMPREHENSIVE METABOLIC PANEL
ALBUMIN: 2.4 g/dL — AB (ref 3.5–5.0)
ALK PHOS: 96 U/L (ref 38–126)
ALT: 19 U/L (ref 14–54)
ANION GAP: 5 (ref 5–15)
AST: 21 U/L (ref 15–41)
BUN: 21 mg/dL — AB (ref 6–20)
CO2: 26 mmol/L (ref 22–32)
Calcium: 8.4 mg/dL — ABNORMAL LOW (ref 8.9–10.3)
Chloride: 104 mmol/L (ref 101–111)
Creatinine, Ser: 0.81 mg/dL (ref 0.44–1.00)
GFR calc Af Amer: >60 mL/min (ref >60)
GFR calc non Af Amer: >60 mL/min (ref >60)
GLUCOSE: 131 mg/dL — AB (ref 65–99)
POTASSIUM: 2.7 mmol/L — AB (ref 3.5–5.1)
SODIUM: 135 mmol/L (ref 135–145)
Total Bilirubin: 0.6 mg/dL (ref 0.3–1.2)
Total Protein: 5.3 g/dL — ABNORMAL LOW (ref 6.5–8.1)

## 2018-01-10 LAB — GLUCOSE, CAPILLARY
GLUCOSE-CAPILLARY: 131 mg/dL — AB (ref 65–99)
GLUCOSE-CAPILLARY: 130 mg/dL — AB (ref 65–99)
Glucose-Capillary: 101 mg/dL — ABNORMAL HIGH (ref 65–99)
Glucose-Capillary: 114 mg/dL — ABNORMAL HIGH (ref 65–99)

## 2018-01-10 LAB — CBC
HCT: 27.8 % — ABNORMAL LOW (ref 35.0–47.0)
HEMOGLOBIN: 9.4 g/dL — AB (ref 12.0–16.0)
MCH: 30 pg (ref 26.0–34.0)
MCHC: 33.7 g/dL (ref 32.0–36.0)
MCV: 88.9 fL (ref 80.0–100.0)
Platelets: 289 10*3/uL (ref 150–440)
RBC: 3.13 MIL/uL — ABNORMAL LOW (ref 3.80–5.20)
RDW: 13.3 % (ref 11.5–14.5)
WBC: 19.2 10*3/uL — AB (ref 3.6–11.0)

## 2018-01-10 LAB — MAGNESIUM: Magnesium: 1.7 mg/dL (ref 1.7–2.4)

## 2018-01-10 LAB — POTASSIUM: Potassium: 3.3 mmol/L — ABNORMAL LOW (ref 3.5–5.1)

## 2018-01-10 MED ORDER — DOXYCYCLINE HYCLATE 100 MG PO CAPS: 100.0000 mg | ORAL_CAPSULE | Freq: Two times a day (BID) | ORAL | 0 refills | Status: DC

## 2018-01-10 MED ORDER — POTASSIUM CHLORIDE CRYS ER 20 MEQ PO TBCR: 40.0000 meq | EXTENDED_RELEASE_TABLET | Freq: Once | ORAL | Status: DC

## 2018-01-10 MED ORDER — CEFDINIR 300 MG PO CAPS: 300.0000 mg | ORAL_CAPSULE | Freq: Two times a day (BID) | ORAL | 0 refills | Status: AC

## 2018-01-10 MED ORDER — CEFDINIR 300 MG PO CAPS: 300.0000 mg | ORAL_CAPSULE | Freq: Two times a day (BID) | ORAL | Status: DC

## 2018-01-10 MED ORDER — SORBITOL 70 % SOLN
960.0000 mL | TOPICAL_OIL | Freq: Four times a day (QID) | ORAL | Status: AC
  Administered 2018-01-10 (×2): 960 mL via RECTAL
  Filled 2018-01-10 (×3): qty 473

## 2018-01-10 MED ORDER — POTASSIUM CHLORIDE 10 MEQ/100ML IV SOLN
10.0000 meq | INTRAVENOUS | Status: DC
  Filled 2018-01-10 (×3): qty 100

## 2018-01-10 MED ORDER — POTASSIUM CHLORIDE 10 MEQ/100ML IV SOLN
10.0000 meq | INTRAVENOUS | Status: AC
  Administered 2018-01-10 (×4): 10 meq via INTRAVENOUS
  Filled 2018-01-10: qty 100

## 2018-01-10 MED ORDER — POTASSIUM CHLORIDE CRYS ER 20 MEQ PO TBCR
40.0000 meq | EXTENDED_RELEASE_TABLET | Freq: Two times a day (BID) | ORAL | Status: AC
  Administered 2018-01-10 (×2): 40 meq via ORAL
  Filled 2018-01-10 (×2): qty 2

## 2018-01-10 MED ORDER — CEFDINIR 300 MG PO CAPS
300.0000 mg | ORAL_CAPSULE | Freq: Two times a day (BID) | ORAL | Status: DC
  Administered 2018-01-10 – 2018-01-11 (×3): 300 mg via ORAL
  Filled 2018-01-10 (×4): qty 1

## 2018-01-10 MED ORDER — PEG 3350-KCL-NA BICARB-NACL 420 G PO SOLR
4000.0000 mL | Freq: Once | ORAL | Status: AC
Start: 2018-01-10 — End: 2018-01-10
  Administered 2018-01-10: 4000 mL via ORAL
  Filled 2018-01-10: qty 4000

## 2018-01-10 NOTE — Progress Notes (Signed)
Subjective: S/p Right TKA  Objective: Vital signs in last 24 hours: Temp:  [99.1 F (37.3 C)-99.4 F (37.4 C)] 99.4 F (37.4 C) (05/26 0513) Pulse Rate:  [92-109] 92 (05/26 0513) Resp:  [20-37] 20 (05/26 0513) BP: (127-159)/(55-69) 131/55 (05/26 0513) SpO2:  [92 %-100 %] 96 % (05/26 0735) Weight:  [78.6 kg (173 lb 3.2 oz)] 78.6 kg (173 lb 3.2 oz) (05/25 1329)  Intake/Output from previous day: 05/25 0701 - 05/26 0700 In: 3416.3 [P.O.:60; I.V.:806.3; IV Piggyback:2550] Out: 500 [Urine:500] Intake/Output this shift: Total I/O In: 720 [P.O.:720] Out: -   Recent Labs    01/09/18 0113 01/10/18 0437  HGB 12.0 9.4*   Recent Labs    01/09/18 0113 01/10/18 0437  WBC 28.8* 19.2*  RBC 4.07 3.13*  HCT 36.2 27.8*  PLT 393 289   Recent Labs    01/09/18 0113 01/10/18 0437  NA 135 135  K 3.9 2.7*  CL 98* 104  CO2 25 26  BUN 29* 21*  CREATININE 1.07* 0.81  GLUCOSE 186* 131*  CALCIUM 9.8 8.4*   No results for input(s): LABPT, INR in the last 72 hours.  Neurologically intact ABD soft Neurovascular intact Sensation intact distally Intact pulses distally Dorsiflexion/Plantar flexion intact Incision: dressing C/D/I and no drainage No cellulitis present Compartment soft      Assessment/Plan: 1. S/p R TKA -Pt admitted for Fever.  Found to have Colitis.  On IV Abx -R knee exam unchanged and no physical exam signs of infection.  Continue PT for knee if ok with medicine. Ice prn. Maintain Aquagel dressing in place until postop follow up visit with [facility]. -Ortho will sign off.    Carnella Guadalajara 01/10/2018, 10:22 AM

## 2018-01-10 NOTE — Progress Notes (Signed)
Pharmacy Electrolyte Monitoring Consult:  Pharmacy consulted to assist in monitoring and replacing electrolytes in this 84 y.o. female admitted on 01/09/2018 with Post-op Problem (Doesn't feel good)   Labs:  Sodium (mmol/L)  Date Value  01/10/2018 135  10/15/2016 141   Potassium (mmol/L)  Date Value  01/10/2018 2.7 (LL)   Calcium (mg/dL)  Date Value  16/05/9603 8.4 (L)   Albumin (g/dL)  Date Value  54/04/8118 2.4 (L)  10/15/2016 4.3    Assessment/Plan:  Ordered KCL  IV for this morning in addition to KCL PO BID x 2 doses.  Will recheck K level tonight at 18:00.  Will follow up electrolytes with am labs.   Stormy Card, [facility] 01/10/2018 7:57 AM

## 2018-01-10 NOTE — Progress Notes (Signed)
Pt again reeducated on the need to drink bowel prep to improve her constipation. Pt again reminded of conversation with Dr Earlene Plater. Pt states she does not want to drink bowel prep. Pt educated that her constipation will not be improved without regimen prescribed by Dr Earlene Plater. Pt continues to complain about drinking bowel prep and receiving enemas.

## 2018-01-10 NOTE — Consult Note (Signed)
Melodie Bouillon, MD [address], [address], [place], [place], [address] [address], [place], [place], [address] Phone: [phone]  Fax: [phone]  Consultation  Referring Provider:     Dr. Juliene Pina Primary Care Physician:  Dale Cadillac, MD Reason for Consultation:     Colitis  Date of Admission:  01/09/2018 Date of Consultation:  01/10/2018         HPI:   Alicia Mccann is a 84 y.o. female who presents to the hospital, she states she just was not feeling well at home.  Patient reports having right total knee replacement on May 20, and was not feeling well after that.  She denies any abdominal pain, but reports constipation over the last 1 to 2 weeks at home.  She even describes manual disimpaction due to constipation about 3 to 4 days ago.  She states she made herself vomit at home about 3 to 4 days ago.  When I specifically asked if she did that because she was nauseous, she denies nausea, but she states she was not feeling well so made herself throw up.  She states this consisted of old food, no blood.  Denies any bright red blood or melena at home.  Reports a history of colonoscopy over 10 years ago.  No family history of colon cancer.  No previous EGDs.  No previous history of IBD.  Normally bowel movements are formed every day to every other day.  No blood in stool in the past.  Denies any fever or chills at home.  Past Medical History:  Diagnosis Date  . Allergic rhinitis   . Anemia   . Anxiety   . Cellulitis   . Cholelithiasis   . Essential hypertension   . History of stress test    a. 12/2008 Ex MV: EF 78%, no ischemia.  . Insomnia   . Osteoarthritis   . Tachycardia   . Valvular heart disease    a. 12/2008 Echo: EF 65%, mild MR;  b. 2/6 SEM RUSB - insignificant murmur, prob Ao Sclerosis.    Past Surgical History:  Procedure Laterality Date  . BACK SURGERY    . BUNIONECTOMY    . CHOLECYSTECTOMY  03/29/2007  . GALLBLADDER SURGERY    . HIP SURGERY      bilateral   . REPLACEMENT TOTAL KNEE Right   . THUMB ARTHROSCOPY      Prior to Admission medications   Medication Sig Start Date End Date Taking? Authorizing Provider  acetaminophen (TYLENOL) 500 MG tablet Take 1,000 mg by mouth every 8 (eight) hours as needed for mild pain or moderate pain.   Yes [provider]  ALPRAZolam Prudy Feeler) 0.25 MG tablet TAKE ONE TABLET AT BEDTIME AS NEEDED 12/30/17  Yes Dale [place], MD  amLODipine (NORVASC) 2.5 MG tablet TAKE 1 TABLET BY MOUTH DAILY 09/23/17  Yes Margaretann Loveless, PA-C  aspirin 325 MG tablet Take 325 mg by mouth every 12 (twelve) hours. 01/05/18 02/06/18 Yes [provider]  cholecalciferol (VITAMIN D) 1000 UNITS tablet Take 1,000 Units by mouth every other day.    Yes [provider]  Cyanocobalamin (VITAMIN B 12 PO) Take 2,000 mg by mouth daily.    Yes [provider]  fluticasone (FLONASE) 50 MCG/ACT nasal spray Place 2 sprays into the nose daily as needed for allergies.  04/11/16  Yes [provider]  mupirocin ointment (BACTROBAN) 2 % Place 1 application into the nose 2 (two) times daily. 11/25/17  Yes Dale [place], MD  rosuvastatin (CRESTOR) 10 MG tablet TAKE 1 TABLET BY MOUTH DAILY 11/30/17  Yes Dale [place], MD  tolterodine (DETROL) 2 MG tablet Take 1 tablet (2 mg total) by mouth 2 (two) times daily. 12/31/17  Yes Dale [place], MD  aspirin EC 81 MG tablet Take 81 mg by mouth daily.    [provider]  doxycycline (VIBRAMYCIN) 100 MG capsule Take 1 capsule (100 mg total) by mouth 2 (two) times daily for 5 days. 01/10/18 01/15/18  Adrian Saran, MD    Family History  Problem Relation Age of Onset  . Heart attack Father   . Stroke Father   . Heart disease Father   . CAD Father   . Heart attack Son 72       MI  . Hypertension Son   . Hypertension Mother   . Heart disease Mother   . Stroke Mother   . Breast cancer Neg Hx      Social History   Tobacco Use  . Smoking  status: Never Smoker  . Smokeless tobacco: Never Used  Substance Use Topics  . Alcohol use: No  . Drug use: No    Allergies as of 01/09/2018 - Review Complete 01/09/2018  Allergen Reaction Noted  . Albumen, egg  04/30/2015  . Clindamycin/lincomycin  12/09/2016  . Eggs or egg-derived products  06/27/2013  . Flagyl [metronidazole]  12/09/2016  . Levaquin [levofloxacin]  05/01/2015  . Milk-related compounds  06/27/2013  . Mobic [meloxicam]  05/08/2017  . Penicillins  06/27/2013  . Shellfish allergy  06/27/2013  . Sulfa antibiotics  06/27/2013  . Wheat bran  06/27/2013    Review of Systems:    All systems reviewed and negative except where noted in HPI.   Physical Exam:  Vital signs in last 24 hours: Vitals:   01/09/18 2126 01/09/18 2126 01/10/18 0513 01/10/18 0735  BP: (!) 127/57 (!) 127/57 (!) 131/55   Pulse: (!) 109 (!) 108 92   Resp: Temp: 99.2 F (37.3 C) 99.2 F (37.3 C) 99.4 F (37.4 C)   TempSrc: Oral Oral Oral   SpO2: 100% 100% 98% 96%  Weight:      Height:       Last BM Date: 01/09/18 General:   Pleasant, cooperative in NAD Head:  Normocephalic and atraumatic. Eyes:   No icterus.   Conjunctiva pink. PERRLA. Ears:  Normal auditory acuity. Neck:  Supple; no masses or thyroidomegaly Lungs: Respirations even and unlabored. Lungs clear to auscultation bilaterally.   No wheezes, crackles, or rhonchi.  Abdomen:  Soft, nondistended, nontender. Normal bowel sounds. No appreciable masses or hepatomegaly.  No rebound or guarding.  Neurologic:  Alert and oriented x3;  grossly normal neurologically. Skin:  Intact without significant lesions or rashes. Cervical Nodes:  No significant cervical adenopathy. Psych:  Alert and cooperative. Normal affect.  LAB RESULTS: Recent Labs    01/09/18 0113 01/10/18 0437  WBC 28.8* 19.2*  HGB 12.0 9.4*  HCT 36.2 27.8*  PLT 393 289   BMET Recent Labs    01/09/18 0113 01/10/18 0437  NA 135 135  K 3.9 2.7*  CL  98* 104  CO2 25 26  GLUCOSE 186* 131*  BUN 29* 21*  CREATININE 1.07* 0.81  CALCIUM 9.8 8.4*   LFT Recent Labs    01/10/18 0437  PROT 5.3*  ALBUMIN 2.4*  AST 21  ALT 19  ALKPHOS 96  BILITOT 0.6   PT/INR No  results for input(s): LABPROT, INR in the last 72 hours.  STUDIES: Dg Chest 2 View  Result Date: 01/09/2018 CLINICAL DATA:  Right knee replacement 1 week ago. Since the surgery, patient states she can't walk, food does not taste the stain, bowel movements are black, and vomiting. Shortness of breath. EXAM: CHEST - 2 VIEW COMPARISON:  02/06/2011 FINDINGS: Heart size and pulmonary vascularity are normal. Slight linear fibrosis or atelectasis in the lung bases. No consolidation or airspace disease. No blunting of costophrenic angles. No pneumothorax. Mediastinal contours appear intact. Calcification of the aorta. Degenerative changes in the spine. IMPRESSION: Linear atelectasis or fibrosis in the lung bases. No active consolidation. Electronically Signed   By: Burman Nieves M.D.   On: 01/09/2018 05:25   Ct Angio Chest Pe W And/or Wo Contrast  Result Date: 01/09/2018 CLINICAL DATA:  Abdominal cramping, constipation. Positive D-dimer level. EXAM: CT ANGIOGRAPHY CHEST WITH CONTRAST TECHNIQUE: Multidetector CT imaging of the chest was performed using the standard protocol during bolus administration of intravenous contrast. Multiplanar CT image reconstructions and MIPs were obtained to evaluate the vascular anatomy. CONTRAST:  75mL ISOVUE-370 IOPAMIDOL (ISOVUE-370) INJECTION 76% COMPARISON:  Radiographs of same day. FINDINGS: Cardiovascular: Satisfactory opacification of the pulmonary arteries to the segmental level. No evidence of pulmonary embolism. Normal heart size. No pericardial effusion. Atherosclerosis of thoracic aorta is noted without aneurysm or dissection. Coronary artery calcifications are noted. Mediastinum/Nodes: No enlarged mediastinal, hilar, or axillary lymph nodes. Thyroid  gland, trachea, and esophagus demonstrate no significant findings. Lungs/Pleura: No pneumothorax or pleural effusion is noted. 6 mm nodule seen in left lower lobe best seen on image number 54 of series 6. Right lung is clear. Upper Abdomen: Visualized portion of splenic flexure appears to be dilated. Musculoskeletal: No chest wall abnormality. No acute or significant osseous findings. Review of the MIP images confirms the above findings. IMPRESSION: No definite evidence of pulmonary embolus. Coronary artery calcifications are noted suggesting coronary artery disease. 6 mm nodule seen in left lower lobe. Non-contrast chest CT at 6-12 months is recommended. If the nodule is stable at time of repeat CT, then future CT at 18-24 months (from today's scan) is considered optional for low-risk patients, but is recommended for high-risk patients. This recommendation follows the consensus statement: Guidelines for Management of Incidental Pulmonary Nodules Detected on CT Images: From the Fleischner Society 2017; Radiology 2017; 284:228-243. Splenic flexure of colon appears to be dilated in the visualized portion of upper abdomen. Colonic inflammation or obstruction cannot be excluded. Aortic Atherosclerosis (ICD10-I70.0). Electronically Signed   By: Lupita Raider, M.D.   On: 01/09/2018 10:56   Ct Abdomen Pelvis W Contrast  Result Date: 01/09/2018 CLINICAL DATA:  Acute generalized abdominal pain. EXAM: CT ABDOMEN AND PELVIS WITH CONTRAST TECHNIQUE: Multidetector CT imaging of the abdomen and pelvis was performed using the standard protocol following bolus administration of intravenous contrast. CONTRAST:  75mL ISOVUE-370 IOPAMIDOL (ISOVUE-370) INJECTION 76% COMPARISON:  CT scan of March 24, 2015. FINDINGS: Lower chest: No acute abnormality. Hepatobiliary: No focal liver abnormality is seen. Status post cholecystectomy. No biliary dilatation. Pancreas: Unremarkable. No pancreatic ductal dilatation or surrounding  inflammatory changes. Spleen: Normal in size without focal abnormality. Adrenals/Urinary Tract: Adrenal glands appear normal. Small bilateral renal cysts are noted. No hydronephrosis or renal obstruction is noted. No renal or ureteral calculi are noted. Urinary bladder is unremarkable. Stomach/Bowel: Sigmoid diverticulosis is noted. No small bowel dilatation is noted. Stomach is unremarkable. The appendix is not clearly visualized. Focal wall thickening  is seen involving proximal sigmoid colon and distal descending colon concerning for focal colitis. This most likely is inflammatory or infectious in etiology. There is significant dilatation of the more proximal large bowel suggesting some degree of obstruction. Vascular/Lymphatic: Aortic atherosclerosis. No enlarged abdominal or pelvic lymph nodes. Reproductive: Uterus and bilateral adnexa are unremarkable. Other: No abdominal wall hernia or abnormality. No abdominopelvic ascites. Musculoskeletal: Status post bilateral hip arthroplasties. Postsurgical and degenerative changes are noted in the lumbar spine. No acute osseous abnormality is noted. IMPRESSION: Wall thickening is seen involving distal descending colon and proximal sigmoid colon concerning for infectious or inflammatory colitis. There does appear to be dilatation of the more proximal large bowel suggesting some degree of obstruction as a result. Sigmoid diverticulosis is noted, but no definite evidence of inflamed diverticulum are noted. Aortic Atherosclerosis (ICD10-I70.0). Electronically Signed   By: Lupita Raider, M.D.   On: 01/09/2018 11:09   US Venous Img Lower Unilateral Right  Result Date: 01/09/2018 CLINICAL DATA:  Pain x5 days EXAM: RIGHT LOWER EXTREMITY VENOUS DOPPLER ULTRASOUND TECHNIQUE: Gray-scale sonography with compression, as well as color and duplex ultrasound, were performed to evaluate the deep venous system from the level of the common femoral vein through the popliteal and  proximal calf veins. COMPARISON:  02/06/2011 FINDINGS: Normal compressibility of the common femoral, superficial femoral, and popliteal veins, as well as the proximal calf veins. No filling defects to suggest DVT on grayscale or color Doppler imaging. Doppler waveforms show normal direction of venous flow, normal respiratory phasicity and response to augmentation. Survey views of the contralateral common femoral vein are unremarkable. IMPRESSION: No evidence of right lower extremity deep vein thrombosis. Electronically Signed   By: Corlis Leak M.D.   On: 01/09/2018 10:13   Dg Knee Complete 4 Views Right  Result Date: 01/09/2018 CLINICAL DATA:  Right knee replacement 1 week ago with multiple complaints since then. EXAM: RIGHT KNEE - COMPLETE 4+ VIEW COMPARISON:  MRI right knee 03/16/2017 FINDINGS: Postoperative right knee replacement. Patella femoral component is present. Components appear well seated although oblique positioning on the lateral view limits evaluation. Skin clips and soft tissue gas are consistent with recent surgery. No evidence of acute fracture or dislocation. IMPRESSION: Right total knee arthroplasty. Components appear well seated and no acute complication is suggested. Electronically Signed   By: Burman Nieves M.D.   On: 01/09/2018 05:27   Dg Abdomen Acute W/chest  Result Date: 01/09/2018 CLINICAL DATA:  Fever, acute generalized abdominal pain. EXAM: DG ABDOMEN ACUTE W/ 1V CHEST COMPARISON:  Radiographs of same day. FINDINGS: There is no evidence of dilated bowel loops or free intraperitoneal air. Status post cholecystectomy. Phleboliths are noted in the pelvis. Heart size and mediastinal contours are within normal limits. Both lungs are clear. IMPRESSION: No evidence of bowel obstruction or ileus. No acute cardiopulmonary disease. Electronically Signed   By: Lupita Raider, M.D.   On: 01/09/2018 09:31      Impression / Plan:   Alicia Mccann is a 84 y.o. y/o female with knee  replacement on May 20, reports of constipation at home for the last 1 to 2 weeks, with CT showing wall thickening involving distal descending colon, proximal sigmoid colon, and dilation of the large bowel more proximally  Patient CT shows colitis She reported one very loose large bowel movement yesterday in the hospital Prior to this she was constipated Patient recently had knee surgery, and likely received antibiotics during the surgery, and narcotics as well  -  Recommend checking stool for C. Difficile today, as that could have led to C. difficile colitis -Also obtain GI stool panel -Obtain surgery consult, due to large bowel dilation noted more proximally to the inflammation -Obtain pharmacy consult, given patient's multiple allergies, and need for gram-negative coverage for colitis -Patient has allergy listed to Flagyl, would recommend starting oral Vanco empirically for C. difficile until stool testing is back, given patient's elevated white count, and colitis on CT, and recent surgery  Patient does not have any hematochezia, ischemic colitis is lower on the differential but the location of thickening seen on CT scan could be due to underlying ischemia  Correct hypokalemia Clear liquid diet okay  No indication for endoscopy in the setting of acute colitis  Thank you for involving me in the care of this patient.      LOS: 1 day   Pasty Spillers, MD  01/10/2018, 8:55 AM

## 2018-01-10 NOTE — Progress Notes (Addendum)
Pt asking when she can go home. Stated MD told her she could go. Pt educated that she is currently able to go home when her potassium is finished. Pt stated she is aware.   Addendum: 1030 Pt discharge cancelled. Pt states she wants to go home. Pt appears unhappy. Pt educated MD would like her to stay. Pt verbalizes understanding.

## 2018-01-10 NOTE — Progress Notes (Signed)
[facility] - [place] at [facility]   PATIENT NAME: Alicia Mccann    MR#:  [id number]  DATE OF BIRTH:  [birth year]  SUBJECTIVE:   patient feels fine this am wants to go home  REVIEW OF SYSTEMS:    Review of Systems  Constitutional: Negative for fever, chills weight loss HENT: Negative for ear pain, nosebleeds, congestion, facial swelling, rhinorrhea, neck pain, neck stiffness and ear discharge.   Respiratory: Negative for cough, shortness of breath, wheezing  Cardiovascular: Negative for chest pain, palpitations and leg swelling.  Gastrointestinal: Negative for heartburn, abdominal pain, vomiting, diarrhea  ++ consitpation Genitourinary: Negative for dysuria, urgency, frequency, hematuria Musculoskeletal: Negative for back pain or joint pain Neurological: Negative for dizziness, seizures, syncope, focal weakness,  numbness and headaches.  Hematological: Does not bruise/bleed easily.  Psychiatric/Behavioral: Negative for hallucinations, confusion, dysphoric mood    Tolerating Diet: yes      DRUG ALLERGIES:   Allergies  Allergen Reactions  . Albumen, Egg   . Clindamycin/Lincomycin   . Eggs Or Egg-Derived Products   . Flagyl [Metronidazole]   . Levaquin [Levofloxacin]   . Milk-Related Compounds   . Mobic [Meloxicam]     GI issues   . Penicillins   . Shellfish Allergy   . Sulfa Antibiotics   . Wheat Bran     VITALS:  Blood pressure (!) 131/55, pulse 92, temperature 99.4 F (37.4 C), temperature source Oral, resp. rate 20, height  (1.549 m), weight 78.6 kg (173 lb 3.2 oz), SpO2 96 %.  PHYSICAL EXAMINATION:  Constitutional: Appears well-developed and well-nourished. No distress. HENT: Normocephalic. Marland Kitchen Oropharynx is clear and moist.  Eyes: Conjunctivae and EOM are normal. PERRLA, no scleral icterus.  Neck: Normal ROM. Neck supple. No JVD. No tracheal deviation. CVS: RRR, S1/S2 +, no murmurs, no gallops, no carotid bruit.  Pulmonary: Effort  and breath sounds normal, no stridor, rhonchi, wheezes, rales.  Abdominal: Soft. BS +,  no distension, tenderness, rebound or guarding.  Musculoskeletal: Normal range of motion. No edema and no tenderness.  Neuro: Alert. CN 2-12 grossly intact. No focal deficits. Skin: Skin is warm and dry. No rash noted. Psychiatric: Normal mood and affect.      LABORATORY PANEL:   CBC Recent Labs  Lab 01/10/18 0437  WBC 19.2*  HGB 9.4*  HCT 27.8*  PLT 289   ------------------------------------------------------------------------------------------------------------------  Chemistries  Recent Labs  Lab 01/10/18 0437  NA 135  K 2.7*  CL 104  CO2 26  GLUCOSE 131*  BUN 21*  CREATININE 0.81  CALCIUM 8.4*  MG 1.7  AST 21  ALT 19  ALKPHOS 96  BILITOT 0.6   ------------------------------------------------------------------------------------------------------------------  Cardiac Enzymes No results for input(s): TROPONINI in the last 168 hours. ------------------------------------------------------------------------------------------------------------------  RADIOLOGY:  Dg Chest 2 View  Result Date: 01/09/2018 CLINICAL DATA:  Right knee replacement 1 week ago. Since the surgery, patient states she can't walk, food does not taste the stain, bowel movements are black, and vomiting. Shortness of breath. EXAM: CHEST - 2 VIEW COMPARISON:  02/06/2011 FINDINGS: Heart size and pulmonary vascularity are normal. Slight linear fibrosis or atelectasis in the lung bases. No consolidation or airspace disease. No blunting of costophrenic angles. No pneumothorax. Mediastinal contours appear intact. Calcification of the aorta. Degenerative changes in the spine. IMPRESSION: Linear atelectasis or fibrosis in the lung bases. No active consolidation. Electronically Signed   By: Burman Nieves M.D.   On: 01/09/2018 05:25   Ct Angio Chest Pe W And/or Wo  Contrast  Result Date: 01/09/2018 CLINICAL DATA:   Abdominal cramping, constipation. Positive D-dimer level. EXAM: CT ANGIOGRAPHY CHEST WITH CONTRAST TECHNIQUE: Multidetector CT imaging of the chest was performed using the standard protocol during bolus administration of intravenous contrast. Multiplanar CT image reconstructions and MIPs were obtained to evaluate the vascular anatomy. CONTRAST:  75mL ISOVUE-370 IOPAMIDOL (ISOVUE-370) INJECTION 76% COMPARISON:  Radiographs of same day. FINDINGS: Cardiovascular: Satisfactory opacification of the pulmonary arteries to the segmental level. No evidence of pulmonary embolism. Normal heart size. No pericardial effusion. Atherosclerosis of thoracic aorta is noted without aneurysm or dissection. Coronary artery calcifications are noted. Mediastinum/Nodes: No enlarged mediastinal, hilar, or axillary lymph nodes. Thyroid gland, trachea, and esophagus demonstrate no significant findings. Lungs/Pleura: No pneumothorax or pleural effusion is noted. 6 mm nodule seen in left lower lobe best seen on image number 54 of series 6. Right lung is clear. Upper Abdomen: Visualized portion of splenic flexure appears to be dilated. Musculoskeletal: No chest wall abnormality. No acute or significant osseous findings. Review of the MIP images confirms the above findings. IMPRESSION: No definite evidence of pulmonary embolus. Coronary artery calcifications are noted suggesting coronary artery disease. 6 mm nodule seen in left lower lobe. Non-contrast chest CT at 6-12 months is recommended. If the nodule is stable at time of repeat CT, then future CT at 18-24 months (from today's scan) is considered optional for low-risk patients, but is recommended for high-risk patients. This recommendation follows the consensus statement: Guidelines for Management of Incidental Pulmonary Nodules Detected on CT Images: From the Fleischner Society 2017; Radiology 2017; 284:228-243. Splenic flexure of colon appears to be dilated in the visualized portion of  upper abdomen. Colonic inflammation or obstruction cannot be excluded. Aortic Atherosclerosis (ICD10-I70.0). Electronically Signed   By: Lupita Raider, M.D.   On: 01/09/2018 10:56   Ct Abdomen Pelvis W Contrast  Result Date: 01/09/2018 CLINICAL DATA:  Acute generalized abdominal pain. EXAM: CT ABDOMEN AND PELVIS WITH CONTRAST TECHNIQUE: Multidetector CT imaging of the abdomen and pelvis was performed using the standard protocol following bolus administration of intravenous contrast. CONTRAST:  75mL ISOVUE-370 IOPAMIDOL (ISOVUE-370) INJECTION 76% COMPARISON:  CT scan of March 24, 2015. FINDINGS: Lower chest: No acute abnormality. Hepatobiliary: No focal liver abnormality is seen. Status post cholecystectomy. No biliary dilatation. Pancreas: Unremarkable. No pancreatic ductal dilatation or surrounding inflammatory changes. Spleen: Normal in size without focal abnormality. Adrenals/Urinary Tract: Adrenal glands appear normal. Small bilateral renal cysts are noted. No hydronephrosis or renal obstruction is noted. No renal or ureteral calculi are noted. Urinary bladder is unremarkable. Stomach/Bowel: Sigmoid diverticulosis is noted. No small bowel dilatation is noted. Stomach is unremarkable. The appendix is not clearly visualized. Focal wall thickening is seen involving proximal sigmoid colon and distal descending colon concerning for focal colitis. This most likely is inflammatory or infectious in etiology. There is significant dilatation of the more proximal large bowel suggesting some degree of obstruction. Vascular/Lymphatic: Aortic atherosclerosis. No enlarged abdominal or pelvic lymph nodes. Reproductive: Uterus and bilateral adnexa are unremarkable. Other: No abdominal wall hernia or abnormality. No abdominopelvic ascites. Musculoskeletal: Status post bilateral hip arthroplasties. Postsurgical and degenerative changes are noted in the lumbar spine. No acute osseous abnormality is noted. IMPRESSION: Wall  thickening is seen involving distal descending colon and proximal sigmoid colon concerning for infectious or inflammatory colitis. There does appear to be dilatation of the more proximal large bowel suggesting some degree of obstruction as a result. Sigmoid diverticulosis is noted, but no definite evidence of inflamed  diverticulum are noted. Aortic Atherosclerosis (ICD10-I70.0). Electronically Signed   By: Lupita Raider, M.D.   On: 01/09/2018 11:09   US Venous Img Lower Unilateral Right  Result Date: 01/09/2018 CLINICAL DATA:  Pain x5 days EXAM: RIGHT LOWER EXTREMITY VENOUS DOPPLER ULTRASOUND TECHNIQUE: Gray-scale sonography with compression, as well as color and duplex ultrasound, were performed to evaluate the deep venous system from the level of the common femoral vein through the popliteal and proximal calf veins. COMPARISON:  02/06/2011 FINDINGS: Normal compressibility of the common femoral, superficial femoral, and popliteal veins, as well as the proximal calf veins. No filling defects to suggest DVT on grayscale or color Doppler imaging. Doppler waveforms show normal direction of venous flow, normal respiratory phasicity and response to augmentation. Survey views of the contralateral common femoral vein are unremarkable. IMPRESSION: No evidence of right lower extremity deep vein thrombosis. Electronically Signed   By: Corlis Leak M.D.   On: 01/09/2018 10:13   Dg Knee Complete 4 Views Right  Result Date: 01/09/2018 CLINICAL DATA:  Right knee replacement 1 week ago with multiple complaints since then. EXAM: RIGHT KNEE - COMPLETE 4+ VIEW COMPARISON:  MRI right knee 03/16/2017 FINDINGS: Postoperative right knee replacement. Patella femoral component is present. Components appear well seated although oblique positioning on the lateral view limits evaluation. Skin clips and soft tissue gas are consistent with recent surgery. No evidence of acute fracture or dislocation. IMPRESSION: Right total knee  arthroplasty. Components appear well seated and no acute complication is suggested. Electronically Signed   By: Burman Nieves M.D.   On: 01/09/2018 05:27   Dg Abdomen Acute W/chest  Result Date: 01/09/2018 CLINICAL DATA:  Fever, acute generalized abdominal pain. EXAM: DG ABDOMEN ACUTE W/ 1V CHEST COMPARISON:  Radiographs of same day. FINDINGS: There is no evidence of dilated bowel loops or free intraperitoneal air. Status post cholecystectomy. Phleboliths are noted in the pelvis. Heart size and mediastinal contours are within normal limits. Both lungs are clear. IMPRESSION: No evidence of bowel obstruction or ileus. No acute cardiopulmonary disease. Electronically Signed   By: Lupita Raider, M.D.   On: 01/09/2018 09:31     ASSESSMENT AND PLAN:   84 y/o female s/p right total knee replacement on May 20 who presented due to abdominal pain constipation.   1.  Severe constipation with colitis on CT scan: Case discussed with GI and surgery Patient will need aggressive management for constipation with GoLYTELY and enemas.  2.  Colitis: Will try Ceftin ear for gram-negative coverage.  Patient has multiple allergies.  3.  Electrolyte abnormalities: These will be repleted and rechecked in a.m.  4.  Essential hypertension: Continue triamterene/HCTZ and Norvasc  D/w gi and surgery    Management plans discussed with the patient and she is in agreement.  CODE STATUS: full  TOTAL TIME TAKING CARE OF THIS PATIENT: 30 minutes.     POSSIBLE D/C 1-2 days, DEPENDING ON CLINICAL CONDITION.   Jerid Catherman M.D on 01/10/2018 at 11:39 AM  Between 7am to 6pm - Pager - [phone] After 6pm go to [email or web address] - password [email or web address]  [facility]  Office  [phone]  CC: Primary care physician; [place], MD  Note: This dictation was prepared with Dragon dictation along with smaller phrase technology. Any transcriptional errors that result from this process are  unintentional.

## 2018-01-10 NOTE — Consult Note (Addendum)
SURGICAL CONSULTATION NOTE (initial) - cpt: 56213  HISTORY OF PRESENT ILLNESS (HPI):  84 y.o. female presented to [facility] ED yesterday for constipation with fever and nausea without emesis except a single episode of reportedly self-induced non-bloody emesis in an effort to "feel better". Patient says she's been "constipated my whole life", describes she rarely drinks much water or eats high-fiber foods, doesn't take a stool softener to prevent constipation (despite at least one prior episode of diverticulitis with extensive sigmoid colonic diverticulosis), and recently underwent Right total knee replacement, for which she's been taking narcotic pain medication. She says she's been +flatus, but has recently only passed very small BM's with "a lot of straining". She denies blood per rectum and acknowledges only mild abdominal "discomfort". Patient otherwise says she is eager to go home, has been tolerating her regular diet since admission, had a large BM last night and again this morning, and denies CP or SOB.  Surgery is consulted by medical physician Dr. Juliene Pina in this context for evaluation and management of constipation with colitis.  PAST MEDICAL HISTORY (PMH):  Past Medical History:  Diagnosis Date  . Allergic rhinitis   . Anemia   . Anxiety   . Cellulitis   . Cholelithiasis   . Essential hypertension   . History of stress test    a. 12/2008 Ex MV: EF 78%, no ischemia.  . Insomnia   . Osteoarthritis   . Tachycardia   . Valvular heart disease    a. 12/2008 Echo: EF 65%, mild MR;  b. 2/6 SEM RUSB - insignificant murmur, prob Ao Sclerosis.     PAST SURGICAL HISTORY (PSH):  Past Surgical History:  Procedure Laterality Date  . BACK SURGERY    . BUNIONECTOMY    . CHOLECYSTECTOMY  03/29/2007  . GALLBLADDER SURGERY    . HIP SURGERY     bilateral   . REPLACEMENT TOTAL KNEE Right   . THUMB ARTHROSCOPY       MEDICATIONS:  Prior to Admission medications   Medication Sig Start Date End Date  Taking? Authorizing Provider  acetaminophen (TYLENOL) 500 MG tablet Take 1,000 mg by mouth every 8 (eight) hours as needed for mild pain or moderate pain.   Yes [provider]  ALPRAZolam Prudy Feeler) 0.25 MG tablet TAKE ONE TABLET AT BEDTIME AS NEEDED 12/30/17  Yes Dale Swartz, MD  amLODipine (NORVASC) 2.5 MG tablet TAKE 1 TABLET BY MOUTH DAILY 09/23/17  Yes Margaretann Loveless, PA-C  aspirin 325 MG tablet Take 325 mg by mouth every 12 (twelve) hours. 01/05/18 02/06/18 Yes [provider]  cholecalciferol (VITAMIN D) 1000 UNITS tablet Take 1,000 Units by mouth every other day.    Yes [provider]  Cyanocobalamin (VITAMIN B 12 PO) Take 2,000 mg by mouth daily.    Yes [provider]  fluticasone (FLONASE) 50 MCG/ACT nasal spray Place 2 sprays into the nose daily as needed for allergies.  04/11/16  Yes [provider]  mupirocin ointment (BACTROBAN) 2 % Place 1 application into the nose 2 (two) times daily. 11/25/17  Yes Dale Lansford, MD  rosuvastatin (CRESTOR) 10 MG tablet TAKE 1 TABLET BY MOUTH DAILY 11/30/17  Yes Dale Weidman, MD  tolterodine (DETROL) 2 MG tablet Take 1 tablet (2 mg total) by mouth 2 (two) times daily. 12/31/17  Yes Dale Deepstep, MD  aspirin EC 81 MG tablet Take 81 mg by mouth daily.    [provider]  cefdinir (OMNICEF) 300 MG capsule Take 1 capsule (  300 mg total) by mouth every 12 (twelve) hours for 14 days. 01/10/18 01/24/18  Adrian Saran, MD     ALLERGIES:  Allergies  Allergen Reactions  . Albumen, Egg   . Clindamycin/Lincomycin   . Eggs Or Egg-Derived Products   . Flagyl [Metronidazole]   . Levaquin [Levofloxacin]   . Milk-Related Compounds   . Mobic [Meloxicam]     GI issues   . Penicillins   . Shellfish Allergy   . Sulfa Antibiotics   . Wheat Bran      SOCIAL HISTORY:  Social History   Socioeconomic History  . Marital status: Widowed    Spouse name: Not on file  . Number of children: Not on file  .  Years of education: Not on file  . Highest education level: Not on file  Occupational History  . Not on file  Social Needs  . Financial resource strain: Not on file  . Food insecurity:    Worry: Not on file    Inability: Not on file  . Transportation needs:    Medical: Not on file    Non-medical: Not on file  Tobacco Use  . Smoking status: Never Smoker  . Smokeless tobacco: Never Used  Substance and Sexual Activity  . Alcohol use: No  . Drug use: No  . Sexual activity: Not Currently  Lifestyle  . Physical activity:    Days per week: Not on file    Minutes per session: Not on file  . Stress: Not on file  Relationships  . Social connections:    Talks on phone: Not on file    Gets together: Not on file    Attends religious service: Not on file    Active member of club or organization: Not on file    Attends meetings of clubs or organizations: Not on file    Relationship status: Not on file  . Intimate partner violence:    Fear of current or ex partner: Not on file    Emotionally abused: Not on file    Physically abused: Not on file    Forced sexual activity: Not on file  Other Topics Concern  . Not on file  Social History Narrative  . Not on file    The patient currently resides (home / rehab facility / nursing home): Home The patient normally is (ambulatory / bedbound): Ambulatory   FAMILY HISTORY:  Family History  Problem Relation Age of Onset  . Heart attack Father   . Stroke Father   . Heart disease Father   . CAD Father   . Heart attack Son 59       MI  . Hypertension Son   . Hypertension Mother   . Heart disease Mother   . Stroke Mother   . Breast cancer Neg Hx      REVIEW OF SYSTEMS:  Constitutional: denies weight loss, fever, chills, or sweats  Eyes: denies any other vision changes, history of eye injury  ENT: denies sore throat, hearing problems  Respiratory: denies shortness of breath, wheezing  Cardiovascular: denies chest pain, palpitations   Gastrointestinal: abdominal pain, N/V, and bowel function as per HPI Genitourinary: denies burning with urination or urinary frequency Musculoskeletal: denies any other joint pains or cramps  Skin: denies any other rashes or skin discolorations  Neurological: denies any other headache, dizziness, weakness  Psychiatric: denies any other depression, anxiety   All other review of systems were negative   VITAL SIGNS:  Temp:  [99.1  F (37.3 C)-99.4 F (37.4 C)] 99.4 F (37.4 C) (05/26 0513) Pulse Rate:  [92-109] 92 (05/26 0513) Resp:  [20-37] 20 (05/26 0513) BP: (127-159)/(55-69) 131/55 (05/26 0513) SpO2:  [92 %-100 %] 96 % (05/26 0735) Weight:  [173 lb 3.2 oz (78.6 kg)] 173 lb 3.2 oz (78.6 kg) (05/25 1329)     Height:  (154.9 cm) Weight: 173 lb 3.2 oz (78.6 kg) BMI (Calculated): 32.74   INTAKE/OUTPUT:  This shift: Total I/O In: 720 [P.O.:720] Out: 200 [Urine:200]  Last 2 shifts: @   PHYSICAL EXAM:  Constitutional:  -- Overweight body habitus  -- Awake, alert, and oriented x3, no apparent distress Eyes:  -- Pupils equally round and reactive to light  -- No scleral icterus, B/L no occular discharge Ear, nose, throat: -- Neck is FROM WNL -- No jugular venous distension  Pulmonary:  -- No wheezes or rhales -- Equal breath sounds bilaterally -- Breathing non-labored at rest Cardiovascular:  -- S1, S2 present  -- No pericardial rubs  Gastrointestinal:  -- Abdomen soft, non-distended, and minimal Left-sided tenderness only with deep palpation and no guarding or rebound tenderness -- No abdominal masses appreciated, pulsatile or otherwise  Musculoskeletal and Integumentary:  -- Wounds or skin discoloration: None appreciated -- Extremities: B/L UE and LE FROM, hands and feet warm, no edema  Neurologic:  -- Motor function: Intact and symmetric -- Sensation: Intact and symmetric Psychiatric:  -- Mood and affect WNL  Labs:  CBC Latest Ref Rng & Units  01/10/2018 01/09/2018 10/19/2017  WBC 3.6 - 11.0 K/uL 19.2(H) 28.8(H) 10.5  Hemoglobin 12.0 - 16.0 g/dL 1.6(X) 09.6 04.5  Hematocrit 35.0 - 47.0 % 27.8(L) 36.2 37.3  Platelets 150 - 440 K/uL 289 393 315.0   CMP Latest Ref Rng & Units 01/10/2018 01/09/2018 10/19/2017  Glucose 65 - 99 mg/dL 409(W) 119(J) 91  BUN 6 - 20 mg/dL 47(W) 29(F) 62(Z)  Creatinine 0.44 - 1.00 mg/dL 3.08 6.57(Q) 4.69  Sodium 135 - 145 mmol/L 135 135 140  Potassium 3.5 - 5.1 mmol/L 2.7(LL) 3.9 3.9  Chloride 101 - 111 mmol/L 104 98(L) 104  CO2 22 - 32 mmol/L Calcium 8.9 - 10.3 mg/dL 6.2(X) 9.8 52.8  Total Protein 6.5 - 8.1 g/dL 5.3(L) 7.1 7.1  Total Bilirubin 0.3 - 1.2 mg/dL 0.6 1.0 0.4  Alkaline Phos 38 - 126 U/L 96 149(H) 72  AST 15 - 41 U/L 21 45(H) 19  ALT 14 - 54 U/L Imaging studies:  CT Abdomen and Pelvis with Contrast (01/09/2018) - personally reviewed with patient and her daughter bedside Wall thickening is seen involving distal descending colon and proximal sigmoid colon concerning for infectious or inflammatory colitis. There does appear to be dilatation of the more proximal large bowel suggesting some degree of obstruction as a result. Sigmoid diverticulosis is noted, but no definite evidence of inflamed diverticulum are noted.  Assessment/Plan: (ICD-10's: K59.09, K5.89) 84 y.o. female with severe acute on chronic multi-factorial constipation with associated descending colitis proximal to extensive sigmoid colonic diverticulosis without significant diverticulitis, complicated by pertinent comorbidities including obesity (BMI 33), HTN, mild mitral valvular regurgitation, osteoarthritis, and generalized anxiety disorder.   - minimize narcotics prn  - recheck WBC tomorrow morning  - antibiotics as per primary medical team  - no surgical intervention indicated at this time  - recommend enemas and aggressive bowel regimen, monitor for adequate hydration  - upon resolution/improvement of  acute issues, patient will require  adequate hydration, high-fiber diet, and at least once daily stool softener to at least reduce risk of future constipation and its associated complications  - medical management of comorbidities as per medical team   - DVT prophylaxis, ambulation encouraged  All of the above findings and recommendations were discussed with the patient, her daughter, her RN, and medical physician, and all of patient's and her family's questions were answered to their expressed satisfaction.  Thank you for the opportunity to participate in this patient's care.   -- Scherrie Gerlach Earlene Plater, MD, RPVI [place]: [facility] General Surgery - Partnering for exceptional care. Office: [phone]

## 2018-01-10 NOTE — Progress Notes (Signed)
Pt educated on need to drink the bowel prep along with enemas x3 to improve her constipation. Pt reminded of her conversation with Dr Earlene Plater in regards to same. Pt states she did not know that she had to drink bowel prep. Pt educated again on need to drink bowel prep. Pt asking for alternatives. Pt educated that at this time there are not any per MD.

## 2018-01-10 NOTE — Plan of Care (Signed)
Pt states she feels improved. Pt labwork improving.

## 2018-01-11 ENCOUNTER — Inpatient Hospital Stay: Payer: Medicare Other

## 2018-01-11 DIAGNOSIS — K5732 Diverticulitis of large intestine without perforation or abscess without bleeding: Secondary | ICD-10-CM | POA: Diagnosis not present

## 2018-01-11 DIAGNOSIS — R109 Unspecified abdominal pain: Secondary | ICD-10-CM | POA: Diagnosis not present

## 2018-01-11 DIAGNOSIS — K529 Noninfective gastroenteritis and colitis, unspecified: Secondary | ICD-10-CM | POA: Diagnosis not present

## 2018-01-11 LAB — CBC
HCT: 27.5 % — ABNORMAL LOW (ref 35.0–47.0)
Hemoglobin: 9.1 g/dL — ABNORMAL LOW (ref 12.0–16.0)
MCH: 29.5 pg (ref 26.0–34.0)
MCHC: 33.1 g/dL (ref 32.0–36.0)
MCV: 89.2 fL (ref 80.0–100.0)
PLATELETS: 302 10*3/uL (ref 150–440)
RBC: 3.09 MIL/uL — AB (ref 3.80–5.20)
RDW: 13 % (ref 11.5–14.5)
WBC: 19 10*3/uL — AB (ref 3.6–11.0)

## 2018-01-11 LAB — BASIC METABOLIC PANEL
ANION GAP: 6 (ref 5–15)
BUN: 12 mg/dL (ref 6–20)
CALCIUM: 8.8 mg/dL — AB (ref 8.9–10.3)
CO2: 27 mmol/L (ref 22–32)
CREATININE: 0.85 mg/dL (ref 0.44–1.00)
Chloride: 104 mmol/L (ref 101–111)
GFR calc non Af Amer: >60 mL/min (ref >60)
Glucose, Bld: 115 mg/dL — ABNORMAL HIGH (ref 65–99)
POTASSIUM: 3.3 mmol/L — AB (ref 3.5–5.1)
Sodium: 137 mmol/L (ref 135–145)

## 2018-01-11 LAB — GLUCOSE, CAPILLARY
Glucose-Capillary: 111 mg/dL — ABNORMAL HIGH (ref 65–99)
Glucose-Capillary: 100 mg/dL — ABNORMAL HIGH (ref 65–99)

## 2018-01-11 LAB — MAGNESIUM: MAGNESIUM: 1.8 mg/dL (ref 1.7–2.4)

## 2018-01-11 MED ORDER — FAMOTIDINE 20 MG PO TABS: 20.0000 mg | ORAL_TABLET | Freq: Two times a day (BID) | ORAL | Status: DC

## 2018-01-11 MED ORDER — POTASSIUM CHLORIDE CRYS ER 20 MEQ PO TBCR
40.0000 meq | EXTENDED_RELEASE_TABLET | Freq: Two times a day (BID) | ORAL | Status: DC
  Administered 2018-01-11: 40 meq via ORAL
  Filled 2018-01-11: qty 2

## 2018-01-11 MED ORDER — POLYETHYLENE GLYCOL 3350 17 G PO PACK: 17.0000 g | PACK | Freq: Every day | ORAL | 0 refills | Status: DC

## 2018-01-11 NOTE — Discharge Summary (Addendum)
[facility]   PATIENT NAME: Alicia Mccann    MR#:  [id number]  DATE OF BIRTH:  April 20, 1933  DATE OF ADMISSION:  01/09/2018 ADMITTING PHYSICIAN: Marguarite Arbour, MD  DATE OF DISCHARGE: 01/11/2018  PRIMARY CARE PHYSICIAN: Dale Volusia, MD    ADMISSION DIAGNOSIS:  Post Op Problems  DISCHARGE DIAGNOSIS:  Principal Problem:   Abdominal pain Active Problems:   Diverticulitis   Valvular heart disease   Hyperglycemia   Colitis   SECONDARY DIAGNOSIS:   Past Medical History:  Diagnosis Date  . Allergic rhinitis   . Anemia   . Anxiety   . Cellulitis   . Cholelithiasis   . Essential hypertension   . History of stress test    a. 12/2008 Ex MV: EF 78%, no ischemia.  . Insomnia   . Osteoarthritis   . Tachycardia   . Valvular heart disease    a. 12/2008 Echo: EF 65%, mild MR;  b. 2/6 SEM RUSB - insignificant murmur, prob Ao Sclerosis.    HOSPITAL COURSE:  84 y/o female s/p right total knee replacement on May 20 who presented due to abdominal pain constipation.   1.  Severe constipation with colitis on CT scan: Evaluated by GI and surgery.  She was severely constipated.  Recommendations were for enema and GoLYTELY.  She has had several bowel movement since then.  She will continue with MiraLAX daily.  2.  Colitis: She tolerates cefdinir well without any issues.  She will continue this treatment for colitis  CBC is still 19 and stable we asked that PCP please follow up on another CBC. She has had no fevers 3.  Electrolyte abnormalities: These electrolytes were repleted.  4.  Essential hypertension: Continue triamterene/HCTZ and Norvasc     DISCHARGE CONDITIONS AND DIET:   Stable for discharge on high-fiber regular diet  CONSULTS OBTAINED:  Treatment Team:  Pasty Spillers, MD Carnella Guadalajara, DO Ancil Linsey, MD  DRUG ALLERGIES:   Allergies  Allergen Reactions  . Albumen, Egg   . Clindamycin/Lincomycin   .  Eggs Or Egg-Derived Products   . Flagyl [Metronidazole]   . Levaquin [Levofloxacin]   . Milk-Related Compounds   . Mobic [Meloxicam]     GI issues   . Penicillins   . Shellfish Allergy   . Sulfa Antibiotics   . Wheat Bran     DISCHARGE MEDICATIONS:   Allergies as of 01/11/2018      Reactions   Albumen, Egg    Clindamycin/lincomycin    Eggs Or Egg-derived Products    Flagyl [metronidazole]    Levaquin [levofloxacin]    Milk-related Compounds    Mobic [meloxicam]    GI issues    Penicillins    Shellfish Allergy    Sulfa Antibiotics    Wheat Bran       Medication List    TAKE these medications   acetaminophen 500 MG tablet Commonly known as:  TYLENOL Take 1,000 mg by mouth every 8 (eight) hours as needed for mild pain or moderate pain.   ALPRAZolam 0.25 MG tablet Commonly known as:  XANAX TAKE ONE TABLET AT BEDTIME AS NEEDED   amLODipine 2.5 MG tablet Commonly known as:  NORVASC TAKE 1 TABLET BY MOUTH DAILY   aspirin 325 MG tablet Take 325 mg by mouth every 12 (twelve) hours. What changed:  Another medication with the same name was removed. Continue taking this medication, and follow the directions you see here.  cefdinir 300 MG capsule Commonly known as:  OMNICEF Take 1 capsule (300 mg total) by mouth every 12 (twelve) hours for 14 days.   cholecalciferol 1000 units tablet Commonly known as:  VITAMIN D Take 1,000 Units by mouth every other day.   fluticasone 50 MCG/ACT nasal spray Commonly known as:  FLONASE Place 2 sprays into the nose daily as needed for allergies.   mupirocin ointment 2 % Commonly known as:  BACTROBAN Place 1 application into the nose 2 (two) times daily.   polyethylene glycol packet Commonly known as:  MIRALAX Take 17 g by mouth daily.   rosuvastatin 10 MG tablet Commonly known as:  CRESTOR TAKE 1 TABLET BY MOUTH DAILY   tolterodine 2 MG tablet Commonly known as:  DETROL Take 1 tablet (2 mg total) by mouth 2 (two) times  daily.   VITAMIN B 12 PO Take 2,000 mg by mouth daily.         Today   CHIEF COMPLAINT:   Patient is anxious to go home today she has had several bowel movements.  She denies abdominal pain   VITAL SIGNS:  Blood pressure (!) 161/61, pulse 86, temperature 98.3 F (36.8 C), temperature source Oral, resp. rate 20, height  (1.549 m), weight 79.2 kg (174 lb 9.6 oz), SpO2 93 %.   REVIEW OF SYSTEMS:  Review of Systems  Constitutional: Negative.  Negative for chills, fever and malaise/fatigue.  HENT: Negative.  Negative for ear discharge, ear pain, hearing loss, nosebleeds and sore throat.   Eyes: Negative.  Negative for blurred vision and pain.  Respiratory: Negative.  Negative for cough, hemoptysis, shortness of breath and wheezing.   Cardiovascular: Negative.  Negative for chest pain, palpitations and leg swelling.  Gastrointestinal: Negative.  Negative for abdominal pain, blood in stool, diarrhea, nausea and vomiting.  Genitourinary: Negative.  Negative for dysuria.  Musculoskeletal: Negative.  Negative for back pain.  Skin: Negative.   Neurological: Negative for dizziness, tremors, speech change, focal weakness, seizures and headaches.  Endo/Heme/Allergies: Negative.  Does not bruise/bleed easily.  Psychiatric/Behavioral: Negative.  Negative for depression, hallucinations and suicidal ideas.     PHYSICAL EXAMINATION:  GENERAL:  84 y.o.-year-old patient lying in the bed with no acute distress.  NECK:  Supple, no jugular venous distention. No thyroid enlargement, no tenderness.  LUNGS: Normal breath sounds bilaterally, no wheezing, rales,rhonchi  No use of accessory muscles of respiration.  CARDIOVASCULAR: S1, S2 normal. No murmurs, rubs, or gallops.  ABDOMEN: Soft, non-tender, non-distended. Bowel sounds present. No organomegaly or mass.  EXTREMITIES: No pedal edema, cyanosis, or clubbing.  PSYCHIATRIC: The patient is alert and oriented x 3.  SKIN: No obvious rash,  lesion, or ulcer.   DATA REVIEW:   CBC Recent Labs  Lab 01/11/18 0421  WBC 19.0*  HGB 9.1*  HCT 27.5*  PLT 302    Chemistries  Recent Labs  Lab 01/10/18 0437  01/11/18 0421  NA 135  --  137  K 2.7*   < > 3.3*  CL 104  --  104  CO2 26  --  27  GLUCOSE 131*  --  115*  BUN 21*  --  12  CREATININE 0.81  --  0.85  CALCIUM 8.4*  --  8.8*  MG 1.7  --  1.8  AST 21  --   --   ALT 19  --   --   ALKPHOS 96  --   --   BILITOT 0.6  --   --    < > =  values in this interval not displayed.    Cardiac Enzymes No results for input(s): TROPONINI in the last 168 hours.  Microbiology Results  @  RADIOLOGY:  No results found.    Allergies as of 01/11/2018      Reactions   Albumen, Egg    Clindamycin/lincomycin    Eggs Or Egg-derived Products    Flagyl [metronidazole]    Levaquin [levofloxacin]    Milk-related Compounds    Mobic [meloxicam]    GI issues    Penicillins    Shellfish Allergy    Sulfa Antibiotics    Wheat Bran       Medication List    TAKE these medications   acetaminophen 500 MG tablet Commonly known as:  TYLENOL Take 1,000 mg by mouth every 8 (eight) hours as needed for mild pain or moderate pain.   ALPRAZolam 0.25 MG tablet Commonly known as:  XANAX TAKE ONE TABLET AT BEDTIME AS NEEDED   amLODipine 2.5 MG tablet Commonly known as:  NORVASC TAKE 1 TABLET BY MOUTH DAILY   aspirin 325 MG tablet Take 325 mg by mouth every 12 (twelve) hours. What changed:  Another medication with the same name was removed. Continue taking this medication, and follow the directions you see here.   cefdinir 300 MG capsule Commonly known as:  OMNICEF Take 1 capsule (300 mg total) by mouth every 12 (twelve) hours for 14 days.   cholecalciferol 1000 units tablet Commonly known as:  VITAMIN D Take 1,000 Units by mouth every other day.   fluticasone 50 MCG/ACT nasal spray Commonly known as:  FLONASE Place 2 sprays into the nose daily as needed for  allergies.   mupirocin ointment 2 % Commonly known as:  BACTROBAN Place 1 application into the nose 2 (two) times daily.   polyethylene glycol packet Commonly known as:  MIRALAX Take 17 g by mouth daily.   rosuvastatin 10 MG tablet Commonly known as:  CRESTOR TAKE 1 TABLET BY MOUTH DAILY   tolterodine 2 MG tablet Commonly known as:  DETROL Take 1 tablet (2 mg total) by mouth 2 (two) times daily.   VITAMIN B 12 PO Take 2,000 mg by mouth daily.          Management plans discussed with the patient and she is in agreement. Stable for discharge home with [facility]  Patient should follow up with pcp  CODE STATUS:     Code Status Orders  (From admission, onward)        Start     Ordered   01/09/18 1331  Full code  Continuous     01/09/18 1330    Code Status History    This patient has a current code status but no historical code status.      TOTAL TIME TAKING CARE OF THIS PATIENT: 38 minutes.    Note: This dictation was prepared with Dragon dictation along with smaller phrase technology. Any transcriptional errors that result from this process are unintentional.  Moriyah Byington M.D on 01/11/2018 at 10:57 AM  Between 7am to 6pm - Pager - [phone] After 6pm go to [email or web address] - Social research officer, government  [facility]  Office  [phone]  CC: Primary care physician; Dale , MD

## 2018-01-11 NOTE — Progress Notes (Signed)
Pharmacy Electrolyte Monitoring Consult:  Pharmacy consulted to assist in monitoring and replacing electrolytes in this 84 y.o. female admitted on 01/09/2018 with Post-op Problem (Doesn't feel good)   Labs:  Sodium (mmol/L)  Date Value  01/11/2018 137  10/15/2016 141   Potassium (mmol/L)  Date Value  01/11/2018 3.3 (L)   Magnesium (mg/dL)  Date Value  40/98/1191 1.8   Calcium (mg/dL)  Date Value  47/82/9562 8.8 (L)   Albumin (g/dL)  Date Value  13/03/6577 2.4 (L)  10/15/2016 4.3    Assessment/Plan:  5/26: Ordered KCL  IV for this morning in addition to KCL PO BID x 2 doses.  (2nd dose of KCl 40 mEq given after K 3.3 check in evening)  5/27: K remains 3.3 - will order another KCl 40 mEq PO BID x2 doses. SCr normal.   Will follow up electrolytes with am labs.   Marty Heck, [facility] 01/11/2018 7:54 AM

## 2018-01-11 NOTE — Progress Notes (Signed)
PHARMACIST - PHYSICIAN COMMUNICATION   CONCERNING: IV to Oral Route Change Policy  RECOMMENDATION: This patient is receiving famotidine by the intravenous route.  Based on criteria approved by the Pharmacy and Therapeutics Committee, the intravenous medication(s) is/are being converted to the equivalent oral dose form(s).   DESCRIPTION: These criteria include:  The patient is eating (either orally or via tube) and/or has been taking other orally administered medications for a least 24 hours  The patient has no evidence of active gastrointestinal bleeding or impaired GI absorption (gastrectomy, short bowel, patient on TNA or NPO).  If you have questions about this conversion, please contact the Pharmacy Department    [phone] )  [facility]   [phone] )  [facility]   [phone] )  [facility]   [phone] )  [facility]   [phone] )  [facility]   Sam Overbeck L, [facility] 01/11/2018 9:05 AM

## 2018-01-11 NOTE — Progress Notes (Signed)
IV was removed. Discharge instructions, follow-up appointments, and prescriptions were provided to the pt. All questions answered. The pt was taken downstairs via wheelchair by NT.  

## 2018-01-11 NOTE — Care Management Note (Signed)
Case Management Note  Patient Details  Name: Alicia Mccann MRN: [id number] Date of Birth: [birth year]   Patient to discharge home today.  Resumption for home health orders have been placed.  Jermaine with [facility] notified of discharge.  Patient states that she lives home alone. PCP Scott.  Has rw, rollator, Cane, in home.   Denies issues obtaining medications and transportation. RNCM signing off  Subjective/Objective:                    Action/Plan:   Expected Discharge Date:  01/11/18               Expected Discharge Plan:  Home w Home Health Services  In-House Referral:     Discharge planning Services  CM Consult  Post Acute Care Choice:  Resumption of Svcs/PTA Provider Choice offered to:     DME Arranged:    DME Agency:     HH Arranged:  RN, PT HH Agency:  [facility]  Status of Service:  Completed, signed off  If discussed at Long Length of Stay Meetings, dates discussed:    Additional Comments:  Chapman Fitch, RN 01/11/2018, 11:15 AM

## 2018-01-11 NOTE — Progress Notes (Signed)
SURGICAL PROGRESS NOTE    Alicia Mccann  UJW:[id number] DOB: 07-28-1933 DOA: 01/09/2018   History: The patient is an 84 year-old female who was admitted with constipation and colitis one week s/p TKA. Her clinical status has improved significantly since admission. She is now having regular BMs and abdominal discomfort has resolved.  Subjective: The patient states that she feels well and wants to go home. She had a BM this morning and several yesterday as well. She has been taking the Go-Lytely. She denies abdominal pain, nausea, and vomiting. She denies fever and chills. She denies chest pain and shortness of breath.  Objective: Vitals:   01/10/18 1337 01/10/18 2213 01/11/18 0403 01/11/18 0846  BP: 133/62 (!) 137/50 (!) 147/53 (!) 161/61  Pulse: (!) 101 92 87 86  Resp:  18 20   Temp: 98.8 F (37.1 C) 99 F (37.2 C) 98.3 F (36.8 C)   TempSrc:  Oral Oral   SpO2: 96% 99% 93%   Weight:   79.2 kg (174 lb 9.6 oz)   Height:        Examination:  General exam: Appears calm and comfortable  Respiratory system: Respiratory effort normal. Cardiovascular system: RRR. No pedal edema. Gastrointestinal system: Abdomen is nondistended, soft and nontender. No organomegaly or masses felt. Normal bowel sounds heard. Central nervous system: Alert and oriented. No focal neurological deficits. Extremities: No edema or calf tenderness Skin: No rashes, lesions or ulcers Psychiatry: Judgement and insight appear normal. Mood & affect appropriate.    Data Reviewed: I have personally reviewed following labs and imaging studies  CBC: Recent Labs  Lab 01/09/18 0113 01/10/18 0437 01/11/18 0421  WBC 28.8* 19.2* 19.0*  HGB 12.0 9.4* 9.1*  HCT 36.2 27.8* 27.5*  MCV 89.0 88.9 89.2  PLT 393 289 302   Basic Metabolic Panel: Recent Labs  Lab 01/09/18 0113 01/10/18 0437 01/10/18 1919 01/11/18 0421  NA 135 135  --  137  K 3.9 2.7* 3.3* 3.3*  CL 98* 104  --  104  CO2 25 26  --  27  GLUCOSE  186* 131*  --  115*  BUN 29* 21*  --  12  CREATININE 1.07* 0.81  --  0.85  CALCIUM 9.8 8.4*  --  8.8*  MG  --  1.7  --  1.8   Liver Function Tests: Recent Labs  Lab 01/09/18 0113 01/10/18 0437  AST 45* 21  ALT 27 19  ALKPHOS 149* 96  BILITOT 1.0 0.6  PROT 7.1 5.3*  ALBUMIN 3.4* 2.4*   Radiology Studies: Dg Abd 1 View  Result Date: 01/11/2018 CLINICAL DATA:  Constipation since 01/04/2018 EXAM: ABDOMEN - 1 VIEW COMPARISON:  Body CT 01/09/2018 FINDINGS: The bowel gas pattern is normal. Moderate stool burden. Nonspecific gaseous dilation of the transverse and proximal descending colon. No radio-opaque calculi or other significant radiographic abnormality are seen. Postsurgical changes from cholecystectomy, lower lumbosacral spine fusion and bilateral hip arthroplasty. IMPRESSION: Nonobstructive bowel gas pattern. Electronically Signed   By: Ted Mcalpine M.D.   On: 01/11/2018 11:00    Scheduled Meds: . amLODipine  2.5 mg Oral Daily  . aspirin  81 mg Oral Daily  . cefdinir  300 mg Oral Q12H  . docusate sodium  100 mg Oral BID  . famotidine  20 mg Oral BID  . heparin  5,000 Units Subcutaneous Q8H  . insulin aspart  0-9 Units Subcutaneous TID WC  . mouth rinse  15 mL Mouth Rinse BID  . oxybutynin  5 mg Oral QHS  . potassium chloride  40 mEq Oral BID  . rosuvastatin  10 mg Oral q1800  . triamterene-hydrochlorothiazide  1 tablet Oral Daily   Continuous Infusions: . sodium chloride Stopped (01/10/18 1610)    Assessment & Plan: The patient is an 84 year-old female with a history of chronic constipation and recent TKA who was admitted with abdominal discomfort, constipation and CT findings consistent with colitis. She has improved clinically since admission. She has had multiple BMs and is tolerating a regular diet. Although her WBC count came down from 28.8, it remains elevated at 19.0. This may be a response to colitis. The patient is stable with no plans for surgery. We agree  with continued antibiotic therapy and close follow-up with her PCP including repeat CBC. She will require a colonoscopy as an outpatient. We agree with continued bowel regimen upon discharge.  Star Age, DO  01/11/2018, 2:07 PM

## 2018-01-12 ENCOUNTER — Telehealth: Payer: Self-pay | Admitting: Internal Medicine

## 2018-01-12 ENCOUNTER — Telehealth: Payer: Self-pay | Admitting: Gastroenterology

## 2018-01-12 NOTE — Telephone Encounter (Signed)
Please advise 

## 2018-01-12 NOTE — Telephone Encounter (Signed)
Spoke with pt to set up 1-2 week Fu per note from Dr. Maximino Greenland she states she will let her PCP Patricia Pesa take care of her issues for now and will call us if needed

## 2018-01-12 NOTE — Telephone Encounter (Signed)
Copied from CRM [id number]. Topic: Quick Communication - See Telephone Encounter >> Jan 12, 2018 10:35 AM Arlyss Gandy, NT wrote: CRM for notification. See Telephone encounter for: 01/12/18. Pt would like a call regarding she went to the ER this weekend and the hospital took her off a lot of medications and she would like to know what she should do. Her home health nurse did start the heart medications back up today. Please give pt a call.

## 2018-01-13 ENCOUNTER — Telehealth: Payer: Self-pay | Admitting: Internal Medicine

## 2018-01-13 NOTE — Telephone Encounter (Signed)
Transition Care Management Follow-up Telephone Call  How have you been since you were released from the hospital? Patient feeling better but wouldlike to go over medications with PCP changes made during hospital stay.   Do you understand why you were in the hospital? yes   Do you understand the discharge instrcutions? yes  Items Reviewed:  Medications reviewed: yes  Allergies reviewed: yes  Dietary changes reviewed: yes  Referrals reviewed: yes   Functional Questionnaire:   Activities of Daily Living (ADLs):   She states they are independent in the following:  States they require assistance with the following: ambulation, bathing and hygiene and dressing   Any transportation issues/concerns?: no   Any patient concerns?Yes, medications patient would like to discuss at Visit most medications have been restarted by homehealth.   Confirmed importance and date/time of follow-up visits scheduled: yes   Confirmed with patient if condition begins to worsen call PCP or go to the ER.  Patient was given the Call-a-Nurse line [phone]: yes

## 2018-01-13 NOTE — Telephone Encounter (Signed)
Patient is going to discuss meds with PCP tomorrow.

## 2018-01-14 ENCOUNTER — Encounter: Payer: Self-pay | Admitting: Internal Medicine

## 2018-01-14 ENCOUNTER — Telehealth: Payer: Self-pay

## 2018-01-14 ENCOUNTER — Ambulatory Visit (INDEPENDENT_AMBULATORY_CARE_PROVIDER_SITE_OTHER): Payer: Medicare Other | Admitting: Internal Medicine

## 2018-01-14 VITALS — BP 128/78 | HR 93 | Temp 98.5°F | Resp 18 | Wt 162.2 lb

## 2018-01-14 DIAGNOSIS — I10 Essential (primary) hypertension: Secondary | ICD-10-CM | POA: Diagnosis not present

## 2018-01-14 DIAGNOSIS — N183 Chronic kidney disease, stage 3 unspecified: Secondary | ICD-10-CM

## 2018-01-14 DIAGNOSIS — F419 Anxiety disorder, unspecified: Secondary | ICD-10-CM | POA: Diagnosis not present

## 2018-01-14 DIAGNOSIS — D72829 Elevated white blood cell count, unspecified: Secondary | ICD-10-CM | POA: Diagnosis not present

## 2018-01-14 DIAGNOSIS — D649 Anemia, unspecified: Secondary | ICD-10-CM | POA: Diagnosis not present

## 2018-01-14 DIAGNOSIS — K529 Noninfective gastroenteritis and colitis, unspecified: Secondary | ICD-10-CM

## 2018-01-14 DIAGNOSIS — M25561 Pain in right knee: Secondary | ICD-10-CM | POA: Diagnosis not present

## 2018-01-14 DIAGNOSIS — R911 Solitary pulmonary nodule: Secondary | ICD-10-CM

## 2018-01-14 DIAGNOSIS — R739 Hyperglycemia, unspecified: Secondary | ICD-10-CM

## 2018-01-14 LAB — BASIC METABOLIC PANEL
BUN: 14 mg/dL (ref 6–23)
CALCIUM: 9.8 mg/dL (ref 8.4–10.5)
CO2: 25 mEq/L (ref 19–32)
Chloride: 99 mEq/L (ref 96–112)
Creatinine, Ser: 0.98 mg/dL (ref 0.40–1.20)
GFR: 57.66 mL/min — AB (ref >60.00)
GLUCOSE: 94 mg/dL (ref 70–99)
POTASSIUM: 3.9 meq/L (ref 3.5–5.1)
Sodium: 137 mEq/L (ref 135–145)

## 2018-01-14 LAB — CBC WITH DIFFERENTIAL/PLATELET
BASOS ABS: 0.2 10*3/uL — AB (ref 0.0–0.1)
Basophils Relative: 1 % (ref 0.0–3.0)
EOS ABS: 0.6 10*3/uL (ref 0.0–0.7)
Eosinophils Relative: 3.4 % (ref 0.0–5.0)
HEMATOCRIT: 32.4 % — AB (ref 36.0–46.0)
HEMOGLOBIN: 10.7 g/dL — AB (ref 12.0–15.0)
LYMPHS PCT: 12.8 % (ref 12.0–46.0)
Lymphs Abs: 2.3 10*3/uL (ref 0.7–4.0)
MCHC: 32.9 g/dL (ref 30.0–36.0)
MCV: 89.6 fl (ref 78.0–100.0)
Monocytes Absolute: 1.4 10*3/uL — ABNORMAL HIGH (ref 0.1–1.0)
Monocytes Relative: 8.2 % (ref 3.0–12.0)
Neutro Abs: 13.2 10*3/uL — ABNORMAL HIGH (ref 1.4–7.7)
Neutrophils Relative %: 74.6 % (ref 43.0–77.0)
Platelets: 501 10*3/uL — ABNORMAL HIGH (ref 150.0–400.0)
RBC: 3.62 Mil/uL — AB (ref 3.87–5.11)
RDW: 13.3 % (ref 11.5–15.5)

## 2018-01-14 LAB — HEPATIC FUNCTION PANEL
ALT: 51 U/L — AB (ref 0–35)
AST: 34 U/L (ref 0–37)
Albumin: 3.7 g/dL (ref 3.5–5.2)
Alkaline Phosphatase: 94 U/L (ref 39–117)
BILIRUBIN DIRECT: 0.1 mg/dL (ref 0.0–0.3)
BILIRUBIN TOTAL: 0.5 mg/dL (ref 0.2–1.2)
Total Protein: 6.9 g/dL (ref 6.0–8.3)

## 2018-01-14 LAB — IBC PANEL
Iron: 35 ug/dL — ABNORMAL LOW (ref 42–145)
SATURATION RATIOS: 11.6 % — AB (ref 20.0–50.0)
Transferrin: 215 mg/dL (ref 212.0–360.0)

## 2018-01-14 LAB — FERRITIN: Ferritin: 235.6 ng/mL (ref 10.0–291.0)

## 2018-01-14 LAB — VITAMIN B12: Vitamin B-12: 664 pg/mL (ref 211–911)

## 2018-01-14 NOTE — Patient Instructions (Signed)
Take a probiotic daily while you are on the antibiotics and for two weeks after completing the antibiotic.    Examples of probiotics are:  Culturelle, florastor or align

## 2018-01-14 NOTE — Telephone Encounter (Signed)
Copied from CRM [id number]. Topic: Quick Communication - See Telephone Encounter >> Jan 14, 2018  9:06 AM Herby Abraham C wrote: CRM for notification. See Telephone encounter for: 01/14/18.    Larita Fife PT with [facility] [phone] Verbal orders to see pt for PT, frequency 2 more times this week and 2 times next week - [phone] Larita Fife with PT with [facility]   Verbal order given to PT for patient.

## 2018-01-14 NOTE — Progress Notes (Signed)
Patient ID: Alicia Mccann, female   DOB: [birth year], 84 y.o.   MRN: [id number]   Subjective:    Patient ID: Alicia Mccann, female    DOB: 1933-01-30, 84 y.o.   MRN: [id number]  HPI  Patient here for hospital follow up.  Was admitted 01/09/18 with severe constipation with colitis noted on CT scan.  Evaluated by GI and surgery.  Recommended enema and GoLytely.  She was diagnosed with colitis.  On omnicef.  White count remained elevated.  She was treated with abx.  Remains on omnicef.  States she is feeling some better.  Still with some decreased energy.  Also concerned she is not remembering things as well.  Feels this is getting some better as she is out of the hospital.  Recent had knee surgery.  Doing well regarding her knee.  Plans to f/u with her surgeon next week.  She is ambulating with two canes.  Has 24 hour sitters.  Overall they feel things are stable and gradually improving.  She is eating some now.  Appetite is starting to come back.  No vomiting.  Bowels are moving.  No abdominal pain.  No chest pain.  Breathing stable.     Past Medical History:  Diagnosis Date  . Allergic rhinitis   . Anemia   . Anxiety   . Cellulitis   . Cholelithiasis   . Essential hypertension   . History of stress test    a. 12/2008 Ex MV: EF 78%, no ischemia.  . Insomnia   . Osteoarthritis   . Tachycardia   . Valvular heart disease    a. 12/2008 Echo: EF 65%, mild MR;  b. 2/6 SEM RUSB - insignificant murmur, prob Ao Sclerosis.   Past Surgical History:  Procedure Laterality Date  . BACK SURGERY    . BUNIONECTOMY    . CHOLECYSTECTOMY  03/29/2007  . GALLBLADDER SURGERY    . HIP SURGERY     bilateral   . REPLACEMENT TOTAL KNEE Right   . THUMB ARTHROSCOPY     Family History  Problem Relation Age of Onset  . Heart attack Father   . Stroke Father   . Heart disease Father   . CAD Father   . Heart attack Son 25       MI  . Hypertension Son   . Hypertension Mother   . Heart disease Mother     . Stroke Mother   . Breast cancer Neg Hx    Social History   Socioeconomic History  . Marital status: Widowed    Spouse name: Not on file  . Number of children: Not on file  . Years of education: Not on file  . Highest education level: Not on file  Occupational History  . Not on file  Social Needs  . Financial resource strain: Not on file  . Food insecurity:    Worry: Not on file    Inability: Not on file  . Transportation needs:    Medical: Not on file    Non-medical: Not on file  Tobacco Use  . Smoking status: Never Smoker  . Smokeless tobacco: Never Used  Substance and Sexual Activity  . Alcohol use: No  . Drug use: No  . Sexual activity: Not Currently  Lifestyle  . Physical activity:    Days per week: Not on file    Minutes per session: Not on file  . Stress: Not on file  Relationships  . Social connections:  Talks on phone: Not on file    Gets together: Not on file    Attends religious service: Not on file    Active member of club or organization: Not on file    Attends meetings of clubs or organizations: Not on file    Relationship status: Not on file  Other Topics Concern  . Not on file  Social History Narrative  . Not on file    Outpatient Encounter Medications as of 01/14/2018  Medication Sig  . acetaminophen (TYLENOL) 500 MG tablet Take 1,000 mg by mouth every 8 (eight) hours as needed for mild pain or moderate pain.  Marland Kitchen ALPRAZolam (XANAX) 0.25 MG tablet TAKE ONE TABLET AT BEDTIME AS NEEDED  . amLODipine (NORVASC) 2.5 MG tablet TAKE 1 TABLET BY MOUTH DAILY  . aspirin 325 MG tablet Take 325 mg by mouth every 12 (twelve) hours.  . cefdinir (OMNICEF) 300 MG capsule Take 1 capsule (300 mg total) by mouth every 12 (twelve) hours for 14 days.  . cholecalciferol (VITAMIN D) 1000 UNITS tablet Take 1,000 Units by mouth every other day.   . rosuvastatin (CRESTOR) 10 MG tablet TAKE 1 TABLET BY MOUTH DAILY  . [DISCONTINUED] Cyanocobalamin (VITAMIN B 12 PO) Take  2,000 mg by mouth daily.   . [DISCONTINUED] fluticasone (FLONASE) 50 MCG/ACT nasal spray Place 2 sprays into the nose daily as needed for allergies.   . [DISCONTINUED] mupirocin ointment (BACTROBAN) 2 % Place 1 application into the nose 2 (two) times daily.  . [DISCONTINUED] polyethylene glycol (MIRALAX) packet Take 17 g by mouth daily.  . [DISCONTINUED] tolterodine (DETROL) 2 MG tablet Take 1 tablet (2 mg total) by mouth 2 (two) times daily.   No facility-administered encounter medications on file as of 01/14/2018.     Review of Systems  Constitutional:       Has had decreased appetite.  Starting to improved.  Some increased fatigue.   HENT: Negative for congestion and sinus pressure.   Respiratory: Negative for cough, chest tightness and shortness of breath.   Cardiovascular: Negative for chest pain and palpitations.  Gastrointestinal: Negative for abdominal pain, nausea and vomiting.       Previous issues with constipation.  Bowels moving now.  Soft.  No diarrhea.   Genitourinary: Negative for difficulty urinating and dysuria.  Musculoskeletal: Negative for myalgias.       S/p knee surgery.  Doing well.  Still with some soft tissue swelling.  No increased swelling or redness.    Skin: Negative for color change and rash.  Neurological: Negative for dizziness, light-headedness and headaches.  Psychiatric/Behavioral: Negative for agitation.       Increased stress with her recent hospitalization.         Objective:     Blood pressure rechecked by me:  130/72  Physical Exam  Constitutional: She appears well-developed and well-nourished. No distress.  HENT:  Nose: Nose normal.  Mouth/Throat: Oropharynx is clear and moist.  Neck: Neck supple. No thyromegaly present.  Cardiovascular: Normal rate and regular rhythm.  Pulmonary/Chest: Breath sounds normal. No respiratory distress. She has no wheezes.  Abdominal: Soft. Bowel sounds are normal. There is no tenderness.  Musculoskeletal:  She exhibits no tenderness.  Minimal soft tissue swelling (expected) around the knee.  No increased erythema.    Lymphadenopathy:    She has no cervical adenopathy.  Skin: No rash noted. No erythema.  Psychiatric: She has a normal mood and affect. Her behavior is normal.    BP 128/78 (  BP Location: Left Arm, Patient Position: Sitting, Cuff Size: Normal)   Pulse 93   Temp 98.5 F (36.9 C) (Oral)   Resp 18   Wt 162 lb 3.2 oz (73.6 kg)   SpO2 97%   BMI 30.65 kg/m  Wt Readings from Last 3 Encounters:  01/14/18 162 lb 3.2 oz (73.6 kg)  01/11/18 174 lb 9.6 oz (79.2 kg)  11/25/17 166 lb 3.2 oz (75.4 kg)     Lab Results  Component Value Date   WBC 17.7 Repeated and verified X2. (H) 01/14/2018   HGB 10.7 (L) 01/14/2018   HCT 32.4 (L) 01/14/2018   PLT 501.0 Repeated and verified X2. (H) 01/14/2018   GLUCOSE 94 01/14/2018   CHOL 179 05/26/2017   TRIG 161.0 (H) 05/26/2017   HDL 41.50 05/26/2017   LDLCALC 105 (H) 05/26/2017   ALT 51 (H) 01/14/2018   AST 34 01/14/2018   NA 137 01/14/2018   K 3.9 01/14/2018   CL 99 01/14/2018   CREATININE 0.98 01/14/2018   BUN 14 01/14/2018   CO2 25 01/14/2018   TSH 1.76 10/19/2017   INR 0.9 01/16/2009    Dg Chest 2 View  Result Date: 01/09/2018 CLINICAL DATA:  Right knee replacement 1 week ago. Since the surgery, patient states she can't walk, food does not taste the stain, bowel movements are black, and vomiting. Shortness of breath. EXAM: CHEST - 2 VIEW COMPARISON:  02/06/2011 FINDINGS: Heart size and pulmonary vascularity are normal. Slight linear fibrosis or atelectasis in the lung bases. No consolidation or airspace disease. No blunting of costophrenic angles. No pneumothorax. Mediastinal contours appear intact. Calcification of the aorta. Degenerative changes in the spine. IMPRESSION: Linear atelectasis or fibrosis in the lung bases. No active consolidation. Electronically Signed   By: Lucienne Capers M.D.   On: 01/09/2018 05:25   Ct Angio  Chest Pe W And/or Wo Contrast  Result Date: 01/09/2018 CLINICAL DATA:  Abdominal cramping, constipation. Positive D-dimer level. EXAM: CT ANGIOGRAPHY CHEST WITH CONTRAST TECHNIQUE: Multidetector CT imaging of the chest was performed using the standard protocol during bolus administration of intravenous contrast. Multiplanar CT image reconstructions and MIPs were obtained to evaluate the vascular anatomy. CONTRAST:  64m ISOVUE-370 IOPAMIDOL (ISOVUE-370) INJECTION 76% COMPARISON:  Radiographs of same day. FINDINGS: Cardiovascular: Satisfactory opacification of the pulmonary arteries to the segmental level. No evidence of pulmonary embolism. Normal heart size. No pericardial effusion. Atherosclerosis of thoracic aorta is noted without aneurysm or dissection. Coronary artery calcifications are noted. Mediastinum/Nodes: No enlarged mediastinal, hilar, or axillary lymph nodes. Thyroid gland, trachea, and esophagus demonstrate no significant findings. Lungs/Pleura: No pneumothorax or pleural effusion is noted. 6 mm nodule seen in left lower lobe best seen on image number 54 of series 6. Right lung is clear. Upper Abdomen: Visualized portion of splenic flexure appears to be dilated. Musculoskeletal: No chest wall abnormality. No acute or significant osseous findings. Review of the MIP images confirms the above findings. IMPRESSION: No definite evidence of pulmonary embolus. Coronary artery calcifications are noted suggesting coronary artery disease. 6 mm nodule seen in left lower lobe. Non-contrast chest CT at 6-12 months is recommended. If the nodule is stable at time of repeat CT, then future CT at 18-24 months (from today's scan) is considered optional for low-risk patients, but is recommended for high-risk patients. This recommendation follows the consensus statement: Guidelines for Management of Incidental Pulmonary Nodules Detected on CT Images: From the Fleischner Society 2017; Radiology 2017; 284:228-243. Splenic  flexure of colon appears  to be dilated in the visualized portion of upper abdomen. Colonic inflammation or obstruction cannot be excluded. Aortic Atherosclerosis (ICD10-I70.0). Electronically Signed   By: Marijo Conception, M.D.   On: 01/09/2018 10:56   Ct Abdomen Pelvis W Contrast  Result Date: 01/09/2018 CLINICAL DATA:  Acute generalized abdominal pain. EXAM: CT ABDOMEN AND PELVIS WITH CONTRAST TECHNIQUE: Multidetector CT imaging of the abdomen and pelvis was performed using the standard protocol following bolus administration of intravenous contrast. CONTRAST:  64m ISOVUE-370 IOPAMIDOL (ISOVUE-370) INJECTION 76% COMPARISON:  CT scan of March 24, 2015. FINDINGS: Lower chest: No acute abnormality. Hepatobiliary: No focal liver abnormality is seen. Status post cholecystectomy. No biliary dilatation. Pancreas: Unremarkable. No pancreatic ductal dilatation or surrounding inflammatory changes. Spleen: Normal in size without focal abnormality. Adrenals/Urinary Tract: Adrenal glands appear normal. Small bilateral renal cysts are noted. No hydronephrosis or renal obstruction is noted. No renal or ureteral calculi are noted. Urinary bladder is unremarkable. Stomach/Bowel: Sigmoid diverticulosis is noted. No small bowel dilatation is noted. Stomach is unremarkable. The appendix is not clearly visualized. Focal wall thickening is seen involving proximal sigmoid colon and distal descending colon concerning for focal colitis. This most likely is inflammatory or infectious in etiology. There is significant dilatation of the more proximal large bowel suggesting some degree of obstruction. Vascular/Lymphatic: Aortic atherosclerosis. No enlarged abdominal or pelvic lymph nodes. Reproductive: Uterus and bilateral adnexa are unremarkable. Other: No abdominal wall hernia or abnormality. No abdominopelvic ascites. Musculoskeletal: Status post bilateral hip arthroplasties. Postsurgical and degenerative changes are noted in the  lumbar spine. No acute osseous abnormality is noted. IMPRESSION: Wall thickening is seen involving distal descending colon and proximal sigmoid colon concerning for infectious or inflammatory colitis. There does appear to be dilatation of the more proximal large bowel suggesting some degree of obstruction as a result. Sigmoid diverticulosis is noted, but no definite evidence of inflamed diverticulum are noted. Aortic Atherosclerosis (ICD10-I70.0). Electronically Signed   By: JMarijo Conception M.D.   On: 01/09/2018 11:09   UK[place]Venous Img Lower Unilateral Right  Result Date: 01/09/2018 CLINICAL DATA:  Pain x5 days EXAM: RIGHT LOWER EXTREMITY VENOUS DOPPLER ULTRASOUND TECHNIQUE: Gray-scale sonography with compression, as well as color and duplex ultrasound, were performed to evaluate the deep venous system from the level of the common femoral vein through the popliteal and proximal calf veins. COMPARISON:  02/06/2011 FINDINGS: Normal compressibility of the common femoral, superficial femoral, and popliteal veins, as well as the proximal calf veins. No filling defects to suggest DVT on grayscale or color Doppler imaging. Doppler waveforms show normal direction of venous flow, normal respiratory phasicity and response to augmentation. Survey views of the contralateral common femoral vein are unremarkable. IMPRESSION: No evidence of right lower extremity deep vein thrombosis. Electronically Signed   By: DLucrezia EuropeM.D.   On: 01/09/2018 10:13   Dg Knee Complete 4 Views Right  Result Date: 01/09/2018 CLINICAL DATA:  Right knee replacement 1 week ago with multiple complaints since then. EXAM: RIGHT KNEE - COMPLETE 4+ VIEW COMPARISON:  MRI right knee 03/16/2017 FINDINGS: Postoperative right knee replacement. Patella femoral component is present. Components appear well seated although oblique positioning on the lateral view limits evaluation. Skin clips and soft tissue gas are consistent with recent surgery. No evidence  of acute fracture or dislocation. IMPRESSION: Right total knee arthroplasty. Components appear well seated and no acute complication is suggested. Electronically Signed   By: WLucienne CapersM.D.   On: 01/09/2018 05:27   Dg  Abdomen Acute W/chest  Result Date: 01/09/2018 CLINICAL DATA:  Fever, acute generalized abdominal pain. EXAM: DG ABDOMEN ACUTE W/ 1V CHEST COMPARISON:  Radiographs of same day. FINDINGS: There is no evidence of dilated bowel loops or free intraperitoneal air. Status post cholecystectomy. Phleboliths are noted in the pelvis. Heart size and mediastinal contours are within normal limits. Both lungs are clear. IMPRESSION: No evidence of bowel obstruction or ileus. No acute cardiopulmonary disease. Electronically Signed   By: Marijo Conception, M.D.   On: 01/09/2018 09:31       Assessment & Plan:   Problem List Items Addressed This Visit    Anemia    Found in hospital.  Recheck CBC and iron studies today.        Relevant Orders   Ferritin (Completed)   Vitamin B12 (Completed)   IBC panel (Completed)   Anxiety    Discussed with her today.  Increased stress with her recent hospitalization, etc.  Does not feel she needs any further intervention.  Discussed her current medications.  Discussed sitters, etc.  Follow.        CKD (chronic kidney disease) stage 3, GFR 30-59 ml/min (HCC)    Avoid antiinflammatories.  Follow metabolic panel.  Recheck today.       Colitis    Recently admitted and diagnosed with colitis. On omnicef.  Bowels moving.  No abdominal pain.  Appetite improving.  Follow.        Essential hypertension - Primary    Blood pressure under good control.  Continue same medication regimen.  Follow pressures.  Follow metabolic panel.        Relevant Orders   Hepatic function panel (Completed)   Basic metabolic panel (Completed)   Hyperglycemia    Follow met b and a1c.       Lung nodule    Found on CT in hospital as outlined.  Recommended f/u in 6-12  months.        Right knee pain    S/p knee surgery.  Doing well.  Has f/u with Dr Claiborne Billings next week.         Other Visit Diagnoses    Leukocytosis, unspecified type       Persistent elevation in the hospital.  Was improving.  Recheck cbc today to confirm improved/normal.     Relevant Orders   CBC with Differential/Platelet (Completed)       Einar Pheasant, MD

## 2018-01-15 ENCOUNTER — Other Ambulatory Visit: Payer: Self-pay | Admitting: Internal Medicine

## 2018-01-15 DIAGNOSIS — R945 Abnormal results of liver function studies: Secondary | ICD-10-CM

## 2018-01-15 DIAGNOSIS — R7989 Other specified abnormal findings of blood chemistry: Secondary | ICD-10-CM

## 2018-01-15 DIAGNOSIS — D72829 Elevated white blood cell count, unspecified: Secondary | ICD-10-CM

## 2018-01-15 NOTE — Progress Notes (Signed)
Order placed for f/u labs.  

## 2018-01-17 ENCOUNTER — Encounter: Payer: Self-pay | Admitting: Internal Medicine

## 2018-01-17 DIAGNOSIS — R911 Solitary pulmonary nodule: Secondary | ICD-10-CM | POA: Insufficient documentation

## 2018-01-17 NOTE — Assessment & Plan Note (Signed)
Found in hospital.  Recheck CBC and iron studies today.

## 2018-01-17 NOTE — Assessment & Plan Note (Signed)
Recently admitted and diagnosed with colitis. On omnicef.  Bowels moving.  No abdominal pain.  Appetite improving.  Follow.

## 2018-01-17 NOTE — Assessment & Plan Note (Signed)
Discussed with her today.  Increased stress with her recent hospitalization, etc.  Does not feel she needs any further intervention.  Discussed her current medications.  Discussed sitters, etc.  Follow.

## 2018-01-17 NOTE — Assessment & Plan Note (Signed)
S/p knee surgery.  Doing well.  Has f/u with Dr Tresa EndoKelly next week.

## 2018-01-17 NOTE — Assessment & Plan Note (Signed)
Follow met b and a1c.  

## 2018-01-17 NOTE — Assessment & Plan Note (Signed)
Blood pressure under good control.  Continue same medication regimen.  Follow pressures.  Follow metabolic panel.   

## 2018-01-17 NOTE — Assessment & Plan Note (Signed)
Avoid antiinflammatories.  Follow metabolic panel.  Recheck today.

## 2018-01-17 NOTE — Assessment & Plan Note (Signed)
Found on CT in hospital as outlined.  Recommended f/u in 6-12 months.

## 2018-01-19 ENCOUNTER — Telehealth: Payer: Self-pay | Admitting: Internal Medicine

## 2018-01-19 DIAGNOSIS — K5909 Other constipation: Secondary | ICD-10-CM

## 2018-01-19 DIAGNOSIS — K529 Noninfective gastroenteritis and colitis, unspecified: Secondary | ICD-10-CM

## 2018-01-19 NOTE — Telephone Encounter (Signed)
Called patient and clarified to take multivitamin with iron patient voiced understanding.

## 2018-01-19 NOTE — Telephone Encounter (Signed)
Copied from CRM [id number]. Topic: Quick Communication - See Telephone Encounter >> Jan 19, 2018  2:53 PM Raquel SarnaHayes, Teresa G wrote: Pt needing carnification on her medication. Wanting Dr. Roby LoftsScott's nurse to call her back asap to discuss.

## 2018-01-21 DIAGNOSIS — Z96651 Presence of right artificial knee joint: Secondary | ICD-10-CM | POA: Insufficient documentation

## 2018-01-25 ENCOUNTER — Other Ambulatory Visit: Payer: Medicare Other

## 2018-01-26 ENCOUNTER — Encounter: Payer: Self-pay | Admitting: Physical Therapy

## 2018-01-26 ENCOUNTER — Other Ambulatory Visit: Payer: Self-pay

## 2018-01-26 ENCOUNTER — Ambulatory Visit: Payer: Medicare Other | Attending: Internal Medicine | Admitting: Physical Therapy

## 2018-01-26 DIAGNOSIS — M25561 Pain in right knee: Secondary | ICD-10-CM | POA: Diagnosis present

## 2018-01-26 DIAGNOSIS — M25661 Stiffness of right knee, not elsewhere classified: Secondary | ICD-10-CM | POA: Insufficient documentation

## 2018-01-26 DIAGNOSIS — R262 Difficulty in walking, not elsewhere classified: Secondary | ICD-10-CM | POA: Diagnosis present

## 2018-01-26 DIAGNOSIS — M6281 Muscle weakness (generalized): Secondary | ICD-10-CM | POA: Insufficient documentation

## 2018-01-27 NOTE — Therapy (Signed)
[place] [facility] [address], [place], [address] Phone: [phone]   Fax:  [phone]  Physical Therapy Evaluation  Patient Details  Name: Alicia Mccann MRN: [id number] Date of Birth: 03/11/1933 Referring Provider: Earlene Plater MD   Encounter Date: 01/26/2018  PT End of Session - 01/26/18 1411    Visit Number  1    Number of Visits  12    Date for PT Re-Evaluation  03/09/18    Authorization Time Period  1/10 progress report    PT Start Time  1304    PT Stop Time  1400    PT Time Calculation (min)  56 min    Equipment Utilized During Treatment  Other (comment) bilateral Lofstrand crutches    Activity Tolerance  Patient tolerated treatment well;Patient limited by pain    Behavior During Therapy  [facility] for tasks assessed/performed       Past Medical History:  Diagnosis Date  . Allergic rhinitis   . Anemia   . Anxiety   . Cellulitis   . Cholelithiasis   . Essential hypertension   . History of stress test    a. 12/2008 Ex MV: EF 78%, no ischemia.  . Insomnia   . Osteoarthritis   . Tachycardia   . Valvular heart disease    a. 12/2008 Echo: EF 65%, mild MR;  b. 2/6 SEM RUSB - insignificant murmur, prob Ao Sclerosis.    Past Surgical History:  Procedure Laterality Date  . BACK SURGERY    . BUNIONECTOMY    . CHOLECYSTECTOMY  03/29/2007  . GALLBLADDER SURGERY    . HIP SURGERY     bilateral   . REPLACEMENT TOTAL KNEE Right   . THUMB ARTHROSCOPY      There were no vitals filed for this visit.   Subjective Assessment - 01/26/18 1320    Subjective  Patient reports she has achy, soreness right knee and is still using ice for pain control and wearing anti embolism stockings. She is walking with AD as needed walker or crutches. She is not driving yet.     Pertinent History  TKA right LE 01/04/2018. long history of right knee pain prior to this surgery and has had bilateral THA 2010, 2011. she has  had home health therapy and is now referred to out patient therapy     Limitations  Sitting;Standing;Walking;House hold activities    How long can you sit comfortably?  10 min    How long can you stand comfortably?  <10 min    How long can you walk comfortably?  short distances    Patient Stated Goals  to return to prior level of function and be able to take care of her dog, walking dog     Currently in Pain?  Yes    Pain Score  5  best 2/10 with medication and ice and worse after being up quite a bit 7/10    Pain Location  Knee    Pain Orientation  Right    Pain Descriptors / Indicators  Aching;Throbbing;Tightness;Sore;Constant    Pain Type  Acute pain;Surgical pain 01/04/2018    Pain Onset  1 to 4 weeks ago    Pain Frequency  Constant    Aggravating Factors   walking, standing, bending knee    Pain Relieving Factors  rest, medication    Effect of Pain on Daily Activities  difficulty with all tasks, chores involving standing, walking and  prolonged sitting         [facility] PT Assessment - 01/26/18 1312      Assessment   Medical Diagnosis  right total joint replacement knee    Referring Provider  Earlene PlaterKelley, Scott S. MD    Onset Date/Surgical Date  01/04/18    Hand Dominance  Right    Prior Therapy  yes in patient and home health x 2 weeks      Precautions   Precautions  None      Balance Screen   Has the patient fallen in the past 6 months  No      Home Environment   Living Environment  Private residence    Living Arrangements  Alone    Available Help at Discharge  Friend(s)    Type of Home  House    Home Access  Stairs to enter    Entrance Stairs-Number of Steps  3    Entrance Stairs-Rails  Right    Home Layout  One level      Prior Function   Level of Independence  Independent    Vocation  Retired    GafferVocation Requirements  N/A    Leisure  Walking the dogs, going out to eat with friends      Cognition   Overall Cognitive Status  Within Functional Limits for tasks  assessed      ROM / Strength   AROM / PROM / Strength  AROM;Strength       Gait: ambulating with (2) Lofstrand crutches with decreased weight shift right LE, short step length, decreased push off right LE, decreased hip and knee flexion right with swing phase AAROM: right knee flexion 5-95 degrees in sitting Strength: deferred due to recent surgery Observation: right knee moderate swelling noted; well healing incision scar Palpation: increased warmth right knee, decreased soft tissue mobility right quadriceps, hamstring and calf muscles and along incision distally     Objective measurements completed on examination: See above findings.      PT Education - 01/26/18 1400    Education provided  Yes    Education Details  POC; exercise instruction; rest, elevation, use of ice    Person(s) Educated  Patient    Methods  Explanation;Demonstration;Verbal cues;Handout    Comprehension  Verbalized understanding;Returned demonstration;Verbal cues required          PT Long Term Goals - 01/26/18 1915      PT LONG TERM GOAL #1   Title  FOTO score improved by 10 points  indicating improvement with functional tasks involving right LE    Status  New    Target Date  02/16/18      PT LONG TERM GOAL #2   Title  FOTO score improved by 20 or more  indicating improvement with functional tasks involving right LE    Status  New    Target Date  03/09/18      PT LONG TERM GOAL #3   Title  Pt will be independent with home exercise and pain control to allow continued self managment of symptoms once discharged    Baseline  limited knowledge of appropriate exercise performance and progression.     Status  New    Target Date  03/09/18             Plan - 01/26/18 1414    Clinical Impression Statement  Patient is an 84 year old female who presents s/p right TKA 01/04/2018 with right knee stiffness and pain limiting  functional activities including walking and standing for extended periods of  time. She has AAROM of 5-95 degrees flexion with pain limiting further motion. She has limited knowledge of appropriate pain control strategies, exercise and progression in order to return to prior level of function.     Clinical Presentation  Evolving    Clinical Presentation due to:  s/p surgery    Clinical Decision Making  Moderate    Rehab Potential  Good    Clinical Impairments Affecting Rehab Potential  (-)age, multiple co morbidities     PT Frequency  2x / week    PT Duration  6 weeks    PT Treatment/Interventions  Cryotherapy;Electrical Stimulation;Moist Heat;Neuromuscular re-education;Patient/family education;Manual techniques    PT Next Visit Plan  pain control, therapeutic exercises    PT Home Exercise Plan  seated knee flexion/extension with ball under her foot, quad sets, ROM exercises    Consulted and Agree with Plan of Care  Patient       Patient will benefit from skilled therapeutic intervention in order to improve the following deficits and impairments:  Decreased strength, Pain, Decreased activity tolerance, Difficulty walking, Impaired perceived functional ability, Increased muscle spasms, Decreased range of motion, Decreased endurance  Visit Diagnosis: Muscle weakness (generalized) - Plan: PT plan of care cert/re-cert  Right knee pain, unspecified chronicity - Plan: PT plan of care cert/re-cert  Difficulty in walking, not elsewhere classified - Plan: PT plan of care cert/re-cert  Stiffness of right knee, not elsewhere classified - Plan: PT plan of care cert/re-cert     Problem List Patient Active Problem List   Diagnosis Date Noted  . Chronic constipation   . Noninfectious gastroenteritis   . Lung nodule 01/17/2018  . Anemia 01/14/2018  . Hyperglycemia 01/09/2018  . Colitis 01/09/2018  . Right knee pain 11/28/2017  . Light headedness 10/21/2017  . Healthcare maintenance 06/25/2017  . CKD (chronic kidney disease) stage 3, GFR 30-59 ml/min (HCC) 06/25/2017   . Malaise and fatigue 01/05/2017  . PAD (peripheral artery disease) (HCC) 05/19/2016  . Valvular heart disease   . Diverticulitis 05/25/2015  . Abdominal pain 05/24/2015  . Allergic rhinitis 04/30/2015  . Absolute anemia 04/30/2015  . Anxiety 04/30/2015  . BMI 30.0-30.9,adult 04/30/2015  . Carpal tunnel syndrome 04/30/2015  . Chronic LBP 04/30/2015  . Edema extremities 04/30/2015  . Cannot sleep 04/30/2015  . Arthritis sicca 04/30/2015  . Seasonal affective disorder (HCC) 04/30/2015  . Heart murmur 06/27/2013  . Essential hypertension 06/27/2013  . Osteoarthritis 06/27/2013  . Mixed hyperlipidemia 06/27/2013  . History of repair of hip joint 07/08/2012  . Cholelithiasis without obstruction 03/22/2007    Beacher May PT 01/27/2018, 7:27 PM  [place] [facility] PHYSICAL AND SPORTS MEDICINE [address] [place], [address] Phone: [phone]   Fax:  [phone]  Name: Alicia Mccann MRN: [id number] Date of Birth: 1932-09-23

## 2018-01-28 ENCOUNTER — Ambulatory Visit: Payer: Medicare Other | Admitting: Physical Therapy

## 2018-01-28 ENCOUNTER — Encounter: Payer: Self-pay | Admitting: Physical Therapy

## 2018-01-28 DIAGNOSIS — M6281 Muscle weakness (generalized): Secondary | ICD-10-CM | POA: Diagnosis not present

## 2018-01-28 DIAGNOSIS — M25561 Pain in right knee: Secondary | ICD-10-CM

## 2018-01-28 DIAGNOSIS — R262 Difficulty in walking, not elsewhere classified: Secondary | ICD-10-CM

## 2018-01-28 DIAGNOSIS — M25661 Stiffness of right knee, not elsewhere classified: Secondary | ICD-10-CM

## 2018-01-29 ENCOUNTER — Other Ambulatory Visit: Payer: Self-pay | Admitting: Internal Medicine

## 2018-01-29 NOTE — Therapy (Signed)
[place] [facility] [address], [place], [address] Phone: [phone]   Fax:  [phone]  Physical Therapy Treatment  Patient Details  Name: Alicia Mccann MRN: [id number] Date of Birth: 1933/08/11 Referring Provider: Earlene Plater MD   Encounter Date: 01/28/2018  PT End of Session - 01/28/18 1405    Visit Number  2    Number of Visits  12    Date for PT Re-Evaluation  03/09/18    Authorization Type  --    Authorization Time Period  2/10 progress report    PT Start Time  1400    PT Stop Time  1445    PT Time Calculation (min)  45 min    Equipment Utilized During Treatment  Other (comment) bilateral Lofstrand crutches    Activity Tolerance  Patient tolerated treatment well;Patient limited by pain    Behavior During Therapy  [facility] for tasks assessed/performed       Past Medical History:  Diagnosis Date  . Allergic rhinitis   . Anemia   . Anxiety   . Cellulitis   . Cholelithiasis   . Essential hypertension   . History of stress test    a. 12/2008 Ex MV: EF 78%, no ischemia.  . Insomnia   . Osteoarthritis   . Tachycardia   . Valvular heart disease    a. 12/2008 Echo: EF 65%, mild MR;  b. 2/6 SEM RUSB - insignificant murmur, prob Ao Sclerosis.    Past Surgical History:  Procedure Laterality Date  . BACK SURGERY    . BUNIONECTOMY    . CHOLECYSTECTOMY  03/29/2007  . GALLBLADDER SURGERY    . HIP SURGERY     bilateral   . REPLACEMENT TOTAL KNEE Right   . THUMB ARTHROSCOPY      There were no vitals filed for this visit.  Subjective Assessment - 01/28/18 1401    Subjective  Patient reports increased pain in right knee yesterday and is hurting today.     Pertinent History  TKA right LE 01/04/2018. long history of right knee pain prior to this surgery and has had bilateral THA 2010, 2011. she has had home health therapy and is now referred to out patient therapy     Limitations   Sitting;Standing;Walking;House hold activities    How long can you sit comfortably?  10 min    How long can you stand comfortably?  <10 min    How long can you walk comfortably?  short distances    Patient Stated Goals  to return to prior level of function and be able to take care of her dog, walking dog     Currently in Pain?  Yes    Pain Score  5     Pain Location  Knee    Pain Orientation  Right    Pain Descriptors / Indicators  Aching;Tightness;Throbbing;Sore;Constant    Pain Type  Acute pain;Surgical pain 01/04/2018    Pain Onset  1 to 4 weeks ago    Pain Frequency  Constant        Objective: Gait: antalgic, lofstrand crutches, WBAT Right LE Observation: moderated swelling and warmth right knee primarily inferior to patella and distal incision  Treatment:  Therapeutic exercise: patient performed exercises with right LE with facilitation, demonstration and verbal/tactile cues of therapist: goal: imrpove ROM, strength, gait, functional use right LE  Supine:  AAROM right LE hip/knee flexion/extension 3 sets x 10 reps Isometric end  range knee flexion 3 x 5 reps with mild resistance tolerated   Manual therapy: right LE x 10 min : goal improve soft tissue and patella mobility, decrease pain STM superficial techniques right LE quadriceps, hamstring and calf muscles with patient supine lying with LE supported on pillow; patella mobilization superior/inferior glides x 10 reps  Modalities: Electrical stimulation: 15 min: Russian stim. 10/10 cycle applied (2) electrodes to quadriceps/VMO right LE with patient reclined with LE supported on pillow; pt. Performing quad sets with each cycle; goal muscle re education High volt estim.clincial program for muscle spasms  (2) electrodes applied to medial and lateral aspect right knee,  intensity to tolerance with patient in reclined position with LE supported on pillow goal: pain, spasms (ice pack applied to right knee with estim. For pain control;  no charge; no adverse reactions noted)    Patient response to treatment: patient demonstrated improved technique with exercises with facilitation and minimal VC for correct alignment. Patient with decreased pain from 5/10 to 3/10. Patient with decreased improved soft tissue elasticity by 25% following STM.       PT Education - 01/28/18 1404    Education provided  Yes    Education Details  exercise instruction    Person(s) Educated  Patient    Methods  Explanation;Verbal cues;Demonstration    Comprehension  Verbalized understanding;Returned demonstration;Verbal cues required          PT Long Term Goals - 01/26/18 1915      PT LONG TERM GOAL #1   Title  FOTO score improved by 10 points  indicating improvement with functional tasks involving right LE    Status  New    Target Date  02/16/18      PT LONG TERM GOAL #2   Title  FOTO score improved by 20 or more  indicating improvement with functional tasks involving right LE    Status  New    Target Date  03/09/18      PT LONG TERM GOAL #3   Title  Pt will be independent with home exercise and pain control to allow continued self managment of symptoms once discharged    Baseline  limited knowledge of appropriate exercise performance and progression.     Status  New    Target Date  03/09/18            Plan - 01/28/18 1445    Clinical Impression Statement  Patient demonstrated improved ROM and ability to ambulate with less pain at end of session. She continues with hpain and limited ROM and strength and will benefit from continued physical therapy intervention to achieve maximal functional return.     Rehab Potential  Good    Clinical Impairments Affecting Rehab Potential  (-)age, multiple co morbidities     PT Frequency  2x / week    PT Duration  6 weeks    PT Treatment/Interventions  Cryotherapy;Electrical Stimulation;Moist Heat;Neuromuscular re-education;Patient/family education;Manual techniques    PT Next Visit Plan   pain control, therapeutic exercises    PT Home Exercise Plan  seated knee flexion/extension with ball under her foot, quad sets, ROM exercises       Patient will benefit from skilled therapeutic intervention in order to improve the following deficits and impairments:  Decreased strength, Pain, Decreased activity tolerance, Difficulty walking, Impaired perceived functional ability, Increased muscle spasms, Decreased range of motion, Decreased endurance  Visit Diagnosis: Muscle weakness (generalized)  Right knee pain, unspecified chronicity  Difficulty in walking, not  elsewhere classified  Stiffness of right knee, not elsewhere classified     Problem List Patient Active Problem List   Diagnosis Date Noted  . Chronic constipation   . Noninfectious gastroenteritis   . Lung nodule 01/17/2018  . Anemia 01/14/2018  . Hyperglycemia 01/09/2018  . Colitis 01/09/2018  . Right knee pain 11/28/2017  . Light headedness 10/21/2017  . Healthcare maintenance 06/25/2017  . CKD (chronic kidney disease) stage 3, GFR 30-59 ml/min (HCC) 06/25/2017  . Malaise and fatigue 01/05/2017  . PAD (peripheral artery disease) (HCC) 05/19/2016  . Valvular heart disease   . Diverticulitis 05/25/2015  . Abdominal pain 05/24/2015  . Allergic rhinitis 04/30/2015  . Absolute anemia 04/30/2015  . Anxiety 04/30/2015  . BMI 30.0-30.9,adult 04/30/2015  . Carpal tunnel syndrome 04/30/2015  . Chronic LBP 04/30/2015  . Edema extremities 04/30/2015  . Cannot sleep 04/30/2015  . Arthritis sicca 04/30/2015  . Seasonal affective disorder (HCC) 04/30/2015  . Heart murmur 06/27/2013  . Essential hypertension 06/27/2013  . Osteoarthritis 06/27/2013  . Mixed hyperlipidemia 06/27/2013  . History of repair of hip joint 07/08/2012  . Cholelithiasis without obstruction 03/22/2007    Beacher MayBrooks, Caprisha Bridgett PT 01/29/2018, 12:07 PM  [place] [facility] [address] [place], [place], [id number] Phone: [phone]   Fax:  [phone]  Name: Alicia Mccann MRN: [id number] Date of Birth: May 24, 1933

## 2018-02-01 ENCOUNTER — Other Ambulatory Visit (INDEPENDENT_AMBULATORY_CARE_PROVIDER_SITE_OTHER): Payer: Medicare Other

## 2018-02-01 ENCOUNTER — Other Ambulatory Visit: Payer: Self-pay | Admitting: Internal Medicine

## 2018-02-01 DIAGNOSIS — D72829 Elevated white blood cell count, unspecified: Secondary | ICD-10-CM

## 2018-02-01 DIAGNOSIS — F419 Anxiety disorder, unspecified: Secondary | ICD-10-CM

## 2018-02-01 DIAGNOSIS — R945 Abnormal results of liver function studies: Secondary | ICD-10-CM | POA: Diagnosis not present

## 2018-02-01 DIAGNOSIS — R7989 Other specified abnormal findings of blood chemistry: Secondary | ICD-10-CM

## 2018-02-01 LAB — HEPATIC FUNCTION PANEL
ALBUMIN: 4 g/dL (ref 3.5–5.2)
ALK PHOS: 79 U/L (ref 39–117)
ALT: 17 U/L (ref 0–35)
AST: 14 U/L (ref 0–37)
BILIRUBIN DIRECT: 0.1 mg/dL (ref 0.0–0.3)
TOTAL PROTEIN: 6.6 g/dL (ref 6.0–8.3)
Total Bilirubin: 0.3 mg/dL (ref 0.2–1.2)

## 2018-02-01 LAB — CBC WITH DIFFERENTIAL/PLATELET
BASOS ABS: 0.4 10*3/uL — AB (ref 0.0–0.1)
BASOS PCT: 3.3 % — AB (ref 0.0–3.0)
Eosinophils Absolute: 0.5 10*3/uL (ref 0.0–0.7)
Eosinophils Relative: 4.5 % (ref 0.0–5.0)
HEMATOCRIT: 30.3 % — AB (ref 36.0–46.0)
HEMOGLOBIN: 10 g/dL — AB (ref 12.0–15.0)
LYMPHS PCT: 14.5 % (ref 12.0–46.0)
Lymphs Abs: 1.7 10*3/uL (ref 0.7–4.0)
MCHC: 33 g/dL (ref 30.0–36.0)
MCV: 89.6 fl (ref 78.0–100.0)
MONOS PCT: 5.9 % (ref 3.0–12.0)
Monocytes Absolute: 0.7 10*3/uL (ref 0.1–1.0)
Neutro Abs: 8.5 10*3/uL — ABNORMAL HIGH (ref 1.4–7.7)
Neutrophils Relative %: 71.8 % (ref 43.0–77.0)
Platelets: 431 10*3/uL — ABNORMAL HIGH (ref 150.0–400.0)
RBC: 3.39 Mil/uL — ABNORMAL LOW (ref 3.87–5.11)
RDW: 13.7 % (ref 11.5–15.5)
WBC: 11.8 10*3/uL — AB (ref 4.0–10.5)

## 2018-02-02 ENCOUNTER — Ambulatory Visit: Payer: Medicare Other | Admitting: Physical Therapy

## 2018-02-02 ENCOUNTER — Other Ambulatory Visit: Payer: Self-pay | Admitting: Internal Medicine

## 2018-02-02 DIAGNOSIS — M25561 Pain in right knee: Secondary | ICD-10-CM

## 2018-02-02 DIAGNOSIS — M25661 Stiffness of right knee, not elsewhere classified: Secondary | ICD-10-CM

## 2018-02-02 DIAGNOSIS — M6281 Muscle weakness (generalized): Secondary | ICD-10-CM | POA: Diagnosis not present

## 2018-02-02 DIAGNOSIS — R262 Difficulty in walking, not elsewhere classified: Secondary | ICD-10-CM

## 2018-02-02 DIAGNOSIS — D649 Anemia, unspecified: Secondary | ICD-10-CM

## 2018-02-02 NOTE — Progress Notes (Signed)
Orders placed for f/u labs.  

## 2018-02-02 NOTE — Telephone Encounter (Signed)
Last OV 01/14/2018 Next OV 02/17/2018 Last refill 12/30/2017

## 2018-02-03 NOTE — Therapy (Signed)
[facility] [address] [place], [address] Phone: [phone]   Fax:  [phone]  Physical Therapy Treatment  Patient Details  Name: Alicia Mccann MRN: [id number] Date of Birth: 30-Aug-1932 Referring Provider: Earlene Plater MD   Encounter Date: 02/02/2018  PT End of Session - 02/02/18 1355    Visit Number  3    Number of Visits  12    Date for PT Re-Evaluation  03/09/18    Authorization Time Period  3/10 progress report    PT Start Time  1307    PT Stop Time  1350    PT Time Calculation (min)  43 min    Equipment Utilized During Treatment  Other (comment) bilateral Lofstrand crutches    Activity Tolerance  Patient tolerated treatment well;Patient limited by pain    Behavior During Therapy  [facility] for tasks assessed/performed       Past Medical History:  Diagnosis Date  . Allergic rhinitis   . Anemia   . Anxiety   . Cellulitis   . Cholelithiasis   . Essential hypertension   . History of stress test    a. 12/2008 Ex MV: EF 78%, no ischemia.  . Insomnia   . Osteoarthritis   . Tachycardia   . Valvular heart disease    a. 12/2008 Echo: EF 65%, mild MR;  b. 2/6 SEM RUSB - insignificant murmur, prob Ao Sclerosis.    Past Surgical History:  Procedure Laterality Date  . BACK SURGERY    . BUNIONECTOMY    . CHOLECYSTECTOMY  03/29/2007  . GALLBLADDER SURGERY    . HIP SURGERY     bilateral   . REPLACEMENT TOTAL KNEE Right   . THUMB ARTHROSCOPY      There were no vitals filed for this visit.  Subjective Assessment - 02/02/18 1307    Subjective  Patient reports increased pain in right knee and is concerned with swelliing in right LE and ankle.     Pertinent History  TKA right LE 01/04/2018. long history of right knee pain prior to this surgery and has had bilateral THA 2010, 2011. she has had home health therapy and is now referred to out patient therapy     Limitations   Sitting;Standing;Walking;House hold activities    How long can you sit comfortably?  10 min    How long can you stand comfortably?  <10 min    How long can you walk comfortably?  short distances    Patient Stated Goals  to return to prior level of function and be able to take care of her dog, walking dog     Currently in Pain?  Yes    Pain Score  7     Pain Location  Knee    Pain Orientation  Right    Pain Descriptors / Indicators  Aching;Tightness;Sore    Pain Type  Acute pain;Surgical pain 01/04/2018    Pain Onset  1 to 4 weeks ago    Pain Frequency  Constant          Objective: Gait: antalgic, WBAT Right LE using (2) Loftstrand crutches Observation: moderated swelling and warmth right knee  AAROM: right knee flexion 0-85 degrees with pain limiting further motion   Treatment:  Therapeutic exercise: patient performed exercises with right LE with facilitation, demonstration and verbal/tactile cues of therapist: goal: imrpove ROM, strength, gait, functional use right LE   Supine:  AAROM right LE  hip/knee flexion/extension 3 sets x 10 reps Isometric end range knee flexion 3 x 5 reps with mild resistance tolerated    Manual therapy: right LE x 13 min : goal improve soft tissue and patella mobility, decrease pain STM superficial techniques right LE quadriceps, hamstring and calf muscles with patient supine lying with LE supported on pillow; patella mobilization superior/inferior glides x 10 reps   Modalities: Electrical stimulation: 15 min: Russian stim. 10/10 cycle applied (2) electrodes to quadriceps/VMO right LE with patient reclined with LE supported on pillow; pt. Performing quad sets with each cycle; goal muscle re education High volt estim.clincial program for muscle spasms  (2) electrodes applied to medial and lateral aspect right knee,  intensity to tolerance with patient in reclined position with LE supported on pillow goal: pain, spasms (ice pack applied to right knee with  estim. For pain control; no charge; no adverse reactions noted)    Patient response to treatment: improved flexibility right knee into flexion up to 95 degrees with treatment. patient required assistance and moderate cuing to perform exercises with good technique.      PT Education - 02/02/18 1350    Education provided  Yes    Education Details  exercise instruction    Person(s) Educated  Patient    Methods  Explanation;Verbal cues;Demonstration    Comprehension  Verbalized understanding;Returned demonstration;Verbal cues required          PT Long Term Goals - 01/26/18 1915      PT LONG TERM GOAL #1   Title  FOTO score improved by 10 points  indicating improvement with functional tasks involving right LE    Status  New    Target Date  02/16/18      PT LONG TERM GOAL #2   Title  FOTO score improved by 20 or more  indicating improvement with functional tasks involving right LE    Status  New    Target Date  03/09/18      PT LONG TERM GOAL #3   Title  Pt will be independent with home exercise and pain control to allow continued self managment of symptoms once discharged    Baseline  limited knowledge of appropriate exercise performance and progression.     Status  New    Target Date  03/09/18            Plan - 02/02/18 1350    Clinical Impression Statement  Patient demonstrates improving right knee flexibiliyt into fleixon with treatment. She continues with decreased ROM and strength right LE which limits function with standing and walking activities and she will benefit from continued physical therapy intervention to achieve goals.     Rehab Potential  Good    Clinical Impairments Affecting Rehab Potential  (-)age, multiple co morbidities     PT Frequency  2x / week    PT Duration  6 weeks    PT Treatment/Interventions  Cryotherapy;Electrical Stimulation;Moist Heat;Neuromuscular re-education;Patient/family education;Manual techniques    PT Next Visit Plan  pain  control, therapeutic exercises    PT Home Exercise Plan  seated knee flexion/extension with ball under her foot, quad sets, ROM exercises       Patient will benefit from skilled therapeutic intervention in order to improve the following deficits and impairments:  Decreased strength, Pain, Decreased activity tolerance, Difficulty walking, Impaired perceived functional ability, Increased muscle spasms, Decreased range of motion, Decreased endurance  Visit Diagnosis: Muscle weakness (generalized)  Right knee pain, unspecified chronicity  Difficulty  in walking, not elsewhere classified  Stiffness of right knee, not elsewhere classified     Problem List Patient Active Problem List   Diagnosis Date Noted  . Chronic constipation   . Noninfectious gastroenteritis   . Lung nodule 01/17/2018  . Anemia 01/14/2018  . Hyperglycemia 01/09/2018  . Colitis 01/09/2018  . Right knee pain 11/28/2017  . Light headedness 10/21/2017  . Healthcare maintenance 06/25/2017  . CKD (chronic kidney disease) stage 3, GFR 30-59 ml/min (HCC) 06/25/2017  . Malaise and fatigue 01/05/2017  . PAD (peripheral artery disease) (HCC) 05/19/2016  . Valvular heart disease   . Diverticulitis 05/25/2015  . Abdominal pain 05/24/2015  . Allergic rhinitis 04/30/2015  . Absolute anemia 04/30/2015  . Anxiety 04/30/2015  . BMI 30.0-30.9,adult 04/30/2015  . Carpal tunnel syndrome 04/30/2015  . Chronic LBP 04/30/2015  . Edema extremities 04/30/2015  . Cannot sleep 04/30/2015  . Arthritis sicca 04/30/2015  . Seasonal affective disorder (HCC) 04/30/2015  . Heart murmur 06/27/2013  . Essential hypertension 06/27/2013  . Osteoarthritis 06/27/2013  . Mixed hyperlipidemia 06/27/2013  . History of repair of hip joint 07/08/2012  . Cholelithiasis without obstruction 03/22/2007    Carl Best 02/03/2018, 5:03 PM  [facility] [address] [place], [address] Phone: [phone]   Fax:  [phone]  Name: Alicia Mccann MRN: [id number] Date of Birth: 09-03-1932

## 2018-02-03 NOTE — Telephone Encounter (Signed)
rx ok'd for alprazolam #[id number] with no refills.   

## 2018-02-04 ENCOUNTER — Ambulatory Visit: Payer: Medicare Other | Admitting: Physical Therapy

## 2018-02-04 DIAGNOSIS — R262 Difficulty in walking, not elsewhere classified: Secondary | ICD-10-CM

## 2018-02-04 DIAGNOSIS — M25561 Pain in right knee: Secondary | ICD-10-CM

## 2018-02-04 DIAGNOSIS — M6281 Muscle weakness (generalized): Secondary | ICD-10-CM

## 2018-02-04 DIAGNOSIS — M25661 Stiffness of right knee, not elsewhere classified: Secondary | ICD-10-CM

## 2018-02-04 NOTE — Therapy (Signed)
[facility] [address] [place], [address] Phone: [phone]   Fax:  [phone]  Physical Therapy Treatment  Patient Details  Name: Alicia Mccann MRN: [id number] Date of Birth: 06-Sep-1932 Referring Provider: Earlene Plater MD   Encounter Date: 02/04/2018  PT End of Session - 02/04/18 1700    Visit Number  4    Number of Visits  12    Date for PT Re-Evaluation  03/09/18    Authorization Time Period  4/10 progress report    PT Start Time  1430    PT Stop Time  1515    PT Time Calculation (min)  45 min    Equipment Utilized During Treatment  Other (comment) bilateral Lofstrand crutches    Activity Tolerance  Patient tolerated treatment well;Patient limited by pain    Behavior During Therapy  [facility] for tasks assessed/performed       Past Medical History:  Diagnosis Date  . Allergic rhinitis   . Anemia   . Anxiety   . Cellulitis   . Cholelithiasis   . Essential hypertension   . History of stress test    a. 12/2008 Ex MV: EF 78%, no ischemia.  . Insomnia   . Osteoarthritis   . Tachycardia   . Valvular heart disease    a. 12/2008 Echo: EF 65%, mild MR;  b. 2/6 SEM RUSB - insignificant murmur, prob Ao Sclerosis.    Past Surgical History:  Procedure Laterality Date  . BACK SURGERY    . BUNIONECTOMY    . CHOLECYSTECTOMY  03/29/2007  . GALLBLADDER SURGERY    . HIP SURGERY     bilateral   . REPLACEMENT TOTAL KNEE Right   . THUMB ARTHROSCOPY      There were no vitals filed for this visit.  Subjective Assessment - 02/04/18 1440    Subjective  Patient reports concerns with swelling and pain in right knee. She is up and moving around her home quite a bit. She is working on elevating her leg more.     Pertinent History  TKA right LE 01/04/2018. long history of right knee pain prior to this surgery and has had bilateral THA 2010, 2011. she has had home health therapy and is now referred to out  patient therapy     Limitations  Sitting;Standing;Walking;House hold activities    How long can you sit comfortably?  10 min    How long can you stand comfortably?  <10 min    How long can you walk comfortably?  short distances    Patient Stated Goals  to return to prior level of function and be able to take care of her dog, walking dog     Currently in Pain?  Yes    Pain Score  7     Pain Location  Knee    Pain Orientation  Right    Pain Descriptors / Indicators  Aching;Tightness;Sore;Constant    Pain Type  Acute pain;Surgical pain 01/04/2018    Pain Onset  1 to 4 weeks ago    Pain Frequency  Constant           Objective: Gait: antalgic, WBAT Right LE using (2) Loftstrand crutches Observation: moderated swelling and warmth right knee  AAROM: right knee flexion 0- up to100 degrees with pain limiting further motion  Treatment:  Therapeutic exercise: patient performed exercises with right LE with facilitation, demonstration and verbal/tactile cues of therapist: goal: imrpove  ROM, strength, gait, functional use right LE  Supine:  AAROM right LE hip/knee flexion/extension 3 sets x 10 reps Isometric end range knee flexion 3 x 5 reps with mild resistance tolerated AAROM knee flexion on ball with assist of therapist to perform through full flexion range of motion   Manual therapy: right LE x 13 min : goal improve soft tissue and patella mobility, decrease pain STM superficial techniques right LE quadriceps, hamstring and calf muscles with patient supine lying with LE supported on pillow; patella mobilization superior/inferior glides x 10 reps  Modalities: Electrical stimulation:15 ZOX:[id number] stim. 10/10 cycle applied (2) electrodes to quadriceps/VMOrightLE with patient reclined with LE supported on pillow; pt. Performing quad setswith each cycle; goal muscle re education High volt estim.clincial program for muscle spasms (2) electrodes applied to medial and lateral aspect  right knee,intensity to tolerance with patient in reclined position with LE supported on pillow with elevation goal: pain, spasms (ice pack applied to right knee with estim. For pain control; no charge; no adverse reactions noted)  Patient response to treatment: improved flexibility right knee into flexion up to100 degrees with treatment. patient required assistance and moderate cuing to perform exercises with good technique. improved pain level 50% at end of session         PT Education - 02/04/18 1533    Education provided  Yes    Education Details  home program keep right LE elevated more and continue with exercise throughout the day    Person(s) Educated  Patient    Methods  Explanation;Demonstration;Verbal cues    Comprehension  Verbalized understanding;Returned demonstration;Verbal cues required          PT Long Term Goals - 01/26/18 1915      PT LONG TERM GOAL #1   Title  FOTO score improved by 10 points  indicating improvement with functional tasks involving right LE    Status  New    Target Date  02/16/18      PT LONG TERM GOAL #2   Title  FOTO score improved by 20 or more  indicating improvement with functional tasks involving right LE    Status  New    Target Date  03/09/18      PT LONG TERM GOAL #3   Title  Pt will be independent with home exercise and pain control to allow continued self managment of symptoms once discharged    Baseline  limited knowledge of appropriate exercise performance and progression.     Status  New    Target Date  03/09/18            Plan - 02/04/18 1518    Clinical Impression Statement  Patient demonstrates improved ROM, decreased swelling and improved ambulation following treatment.  She continues with swelling and pain and weakness following right TKA and will require continued physical therapy intervention to achieve maximal function with daily activities and ambulation.    Rehab Potential  Good    Clinical Impairments  Affecting Rehab Potential  (-)age, multiple co morbidities     PT Frequency  2x / week    PT Duration  6 weeks    PT Treatment/Interventions  Cryotherapy;Electrical Stimulation;Moist Heat;Neuromuscular re-education;Patient/family education;Manual techniques    PT Next Visit Plan  pain control, therapeutic exercises    PT Home Exercise Plan  seated knee flexion/extension with ball under her foot, quad sets, ROM exercises       Patient will benefit from skilled therapeutic intervention in order to improve  the following deficits and impairments:  Decreased strength, Pain, Decreased activity tolerance, Difficulty walking, Impaired perceived functional ability, Increased muscle spasms, Decreased range of motion, Decreased endurance  Visit Diagnosis: Muscle weakness (generalized)  Right knee pain, unspecified chronicity  Difficulty in walking, not elsewhere classified  Stiffness of right knee, not elsewhere classified     Problem List Patient Active Problem List   Diagnosis Date Noted  . Chronic constipation   . Noninfectious gastroenteritis   . Lung nodule 01/17/2018  . Anemia 01/14/2018  . Hyperglycemia 01/09/2018  . Colitis 01/09/2018  . Right knee pain 11/28/2017  . Light headedness 10/21/2017  . Healthcare maintenance 06/25/2017  . CKD (chronic kidney disease) stage 3, GFR 30-59 ml/min (HCC) 06/25/2017  . Malaise and fatigue 01/05/2017  . PAD (peripheral artery disease) (HCC) 05/19/2016  . Valvular heart disease   . Diverticulitis 05/25/2015  . Abdominal pain 05/24/2015  . Allergic rhinitis 04/30/2015  . Absolute anemia 04/30/2015  . Anxiety 04/30/2015  . BMI 30.0-30.9,adult 04/30/2015  . Carpal tunnel syndrome 04/30/2015  . Chronic LBP 04/30/2015  . Edema extremities 04/30/2015  . Cannot sleep 04/30/2015  . Arthritis sicca 04/30/2015  . Seasonal affective disorder (HCC) 04/30/2015  . Heart murmur 06/27/2013  . Essential hypertension 06/27/2013  . Osteoarthritis  06/27/2013  . Mixed hyperlipidemia 06/27/2013  . History of repair of hip joint 07/08/2012  . Cholelithiasis without obstruction 03/22/2007    Beacher MayBrooks, Armanie Ullmer PT 02/05/2018, 10:14 AM   [facility] [address] [place], [place], [id number] Phone: [phone]   Fax:  [phone]  Name: Alicia Mccann MRN: [id number] Date of Birth: [birth year]

## 2018-02-04 NOTE — Telephone Encounter (Signed)
Called to pharmacy 

## 2018-02-08 ENCOUNTER — Ambulatory Visit: Payer: Medicare Other | Admitting: Physical Therapy

## 2018-02-08 ENCOUNTER — Encounter: Payer: Self-pay | Admitting: Physical Therapy

## 2018-02-08 DIAGNOSIS — M25661 Stiffness of right knee, not elsewhere classified: Secondary | ICD-10-CM

## 2018-02-08 DIAGNOSIS — M6281 Muscle weakness (generalized): Secondary | ICD-10-CM

## 2018-02-08 DIAGNOSIS — M25561 Pain in right knee: Secondary | ICD-10-CM

## 2018-02-08 DIAGNOSIS — R262 Difficulty in walking, not elsewhere classified: Secondary | ICD-10-CM

## 2018-02-08 NOTE — Therapy (Addendum)
[facility] PHYS[facility] MEDICINE [address] [place], [place], [address] Phone: [phone]   Fax:  [phone]  Physical Therapy Treatment  Patient Details  Name: Alicia Mccann MRN: [id number] Date of Birth: [birth year] Referring Provider: Earlene PlaterKelley, Scott S. MD   Encounter Date: 02/08/2018  PT End of Session - 02/08/18 1158    Visit Number  5    Number of Visits  12    Date for PT Re-Evaluation  03/09/18    Authorization Time Period  5/10 progress report    PT Start Time  1151    PT Stop Time  1232    PT Time Calculation (min)  41 min    Equipment Utilized During Treatment  Other (comment) one Lofstrand crutch    Activity Tolerance  Patient tolerated treatment well;Patient limited by pain    Behavior During Therapy  [facility]/per[facility]       Past Medical History:  Diagnosis Date  . Allergic rhinitis   . Anemia   . Anxiety   . Cellulitis   . Cholelithiasis   . Essential hypertension   . History of stress test    a. 12/2008 Ex MV: EF 78%, no ischemia.  . Insomnia   . Osteoarthritis   . Tachycardia   . Valvular heart disease    a. 12/2008 Echo: EF 65%, mild MR;  b. 2/6 SEM RUSB - insignificant murmur, prob Ao Sclerosis.    Past Surgical History:  Procedure Laterality Date  . BACK SURGERY    . BUNIONECTOMY    . CHOLECYSTECTOMY  03/29/2007  . GALLBLADDER SURGERY    . HIP SURGERY     bilateral   . REPLACEMENT TOTAL KNEE Right   . THUMB ARTHROSCOPY      There were no vitals filed for this visit.  Subjective Assessment - 02/08/18 1155    Subjective  Patient reports she is seeing improvement with significant decreased pain in right knee and is now walking with one Lofstrand crutch.     Pertinent History  TKA right LE 01/04/2018. long history of right knee pain prior to this surgery and has had bilateral THA 2010, 2011. she has had home health therapy and is now referred to out patient therapy     Limitations   Sitting;Standing;Walking;House hold activities    How long can you sit comfortably?  10 min    How long can you stand comfortably?  <10 min    How long can you walk comfortably?  short distances    Patient Stated Goals  to return to prior level of function and be able to take care of her dog, walking dog     Currently in Pain?  Yes    Pain Score  4     Pain Location  Knee    Pain Descriptors / Indicators  Aching;Tightness;Sore    Pain Type  Acute pain;Surgical pain 01/04/2018    Pain Onset  1 to 4 weeks ago    Pain Frequency  Constant         Objective: Gait: antalgic, WBAT Right LE using (1) Loftstrand crutch Observation: moderated swelling and warmth right knee mild redness around distal incision  AAROM: right knee flexion 0-100 degrees with pain adn stiffness limiting further motion    Treatment:  Therapeutic exercise: patient performed exercises with right LE with facilitation, demonstration and verbal/tactile cues of therapist: goal: imrpove ROM, strength, gait, functional use right LE   Supine:  AAROM right LE hip/knee flexion/extension 3 sets x 10 reps Isometric end range knee flexion 3 x 5 reps with mild resistance tolerated sitting: AAROM right knee flexion x 10 isometric end range right knee flexion 10 reps 5 second holds to improve ROM    Manual therapy: right LE x 8 min : goal improve soft tissue and patella mobility, decrease pain STM superficial techniques right LE quadriceps, hamstring and calf muscles with patient supine lying with LE supported on pillow; patella mobilization superior/inferior glides x 10 reps   Modalities: Electrical stimulation: 15 min: Russian stim. 10/10 cycle applied (2) electrodes to quadriceps/VMO right LE with patient reclined with LE supported on pillow; pt. Performing quad sets with each cycle; goal muscle re education High volt estim.clincial program for muscle spasms  (2) electrodes applied to medial and lateral aspect right knee,   intensity to tolerance with patient in reclined position with LE supported on pillow with elevation goal: pain, spasms (ice pack applied to right knee with estim. For pain control; no charge; no adverse reactions noted)    Patient response to treatment: improved soft tissue elasticity with improved flexibility into knee flexion following STM, exercise.          PT Education - 02/08/18 1225    Education provided  Yes    Education Details  re assessed home program to continue for ROM, elevation, ice for pain control and decreased swelling    Person(s) Educated  Patient    Methods  Explanation    Comprehension  Verbalized understanding          PT Long Term Goals - 01/26/18 1915      PT LONG TERM GOAL #1   Title  FOTO score improved by 10 points  indicating improvement with functional tasks involving right LE    Status  New    Target Date  02/16/18      PT LONG TERM GOAL #2   Title  FOTO score improved by 20 or more  indicating improvement with functional tasks involving right LE    Status  New    Target Date  03/09/18      PT LONG TERM GOAL #3   Title  Pt will be independent with home exercise and pain control to allow continued self managment of symptoms once discharged    Baseline  limited knowledge of appropriate exercise performance and progression.     Status  New    Target Date  03/09/18            Plan - 02/08/18 1224    Clinical Impression Statement  Patient is progressing steadily towards goals with improving ambulation with decreased assistance, using one crutch instead of 2. She is progressing steadily with ROM as well and should conitnue to improve with additional physical therapy intervention.     Rehab Potential  Good    Clinical Impairments Affecting Rehab Potential  (-)age, multiple co morbidities     PT Frequency  2x / week    PT Duration  6 weeks    PT Treatment/Interventions  Cryotherapy;Electrical Stimulation;Moist Heat;Neuromuscular  re-education;Patient/family education;Manual techniques    PT Next Visit Plan  pain control, therapeutic exercises    PT Home Exercise Plan  seated knee flexion/extension with ball under her foot, quad sets, ROM exercises       Patient will benefit from skilled therapeutic intervention in order to improve the following deficits and impairments:  Decreased strength, Pain, Decreased activity tolerance, Difficulty walking, Impaired  perceived functional ability, Increased muscle spasms, Decreased range of motion, Decreased endurance  Visit Diagnosis: Muscle weakness (generalized)  Right knee pain, unspecified chronicity  Difficulty in walking, not elsewhere classified  Stiffness of right knee, not elsewhere classified     Problem List Patient Active Problem List   Diagnosis Date Noted  . Chronic constipation   . Noninfectious gastroenteritis   . Lung nodule 01/17/2018  . Anemia 01/14/2018  . Hyperglycemia 01/09/2018  . Colitis 01/09/2018  . Right knee pain 11/28/2017  . Light headedness 10/21/2017  . Healthcare maintenance 06/25/2017  . CKD (chronic kidney disease) stage 3, GFR 30-59 ml/min (HCC) 06/25/2017  . Malaise and fatigue 01/05/2017  . PAD (peripheral artery disease) (HCC) 05/19/2016  . Valvular heart disease   . Diverticulitis 05/25/2015  . Abdominal pain 05/24/2015  . Allergic rhinitis 04/30/2015  . Absolute anemia 04/30/2015  . Anxiety 04/30/2015  . BMI 30.0-30.9,adult 04/30/2015  . Carpal tunnel syndrome 04/30/2015  . Chronic LBP 04/30/2015  . Edema extremities 04/30/2015  . Cannot sleep 04/30/2015  . Arthritis sicca 04/30/2015  . Seasonal affective disorder (HCC) 04/30/2015  . Heart murmur 06/27/2013  . Essential hypertension 06/27/2013  . Osteoarthritis 06/27/2013  . Mixed hyperlipidemia 06/27/2013  . History of repair of hip joint 07/08/2012  . Cholelithiasis without obstruction 03/22/2007    Beacher May PT 02/08/2018, 10:54 PM  [facility] PHYSICAL AND SPORTS MEDICINE [address], [place], [address] Phone: [phone]   Fax:  [phone]  Name: Alicia Mccann MRN: [id number] Date of Birth: June 10, 1933

## 2018-02-09 ENCOUNTER — Ambulatory Visit: Payer: Medicare Other | Admitting: Physical Therapy

## 2018-02-11 ENCOUNTER — Encounter: Payer: Self-pay | Admitting: Physical Therapy

## 2018-02-11 ENCOUNTER — Ambulatory Visit: Payer: Medicare Other | Admitting: Physical Therapy

## 2018-02-11 DIAGNOSIS — R262 Difficulty in walking, not elsewhere classified: Secondary | ICD-10-CM

## 2018-02-11 DIAGNOSIS — M6281 Muscle weakness (generalized): Secondary | ICD-10-CM

## 2018-02-11 DIAGNOSIS — M25661 Stiffness of right knee, not elsewhere classified: Secondary | ICD-10-CM

## 2018-02-11 DIAGNOSIS — M25561 Pain in right knee: Secondary | ICD-10-CM

## 2018-02-11 NOTE — Therapy (Signed)
[facility] [address], [place], [address] Phone: [phone]   Fax:  [phone]  Physical Therapy Treatment  Patient Details  Name: Alicia Mccann MRN: [id number] Date of Birth: June 18, 1933 Referring Provider: Earlene Plater MD   Encounter Date: 02/11/2018  PT End of Session - 02/11/18 1311    Visit Number  6    Number of Visits  12    Date for PT Re-Evaluation  03/09/18    Authorization Time Period  6/10 progress report    PT Start Time  1301    PT Stop Time  1344    PT Time Calculation (min)  43 min    Equipment Utilized During Treatment  Other (comment) bilateral Lofstrand crutches    Activity Tolerance  Patient tolerated treatment well;Patient limited by pain    Behavior During Therapy  [facility] for tasks assessed/performed       Past Medical History:  Diagnosis Date  . Allergic rhinitis   . Anemia   . Anxiety   . Cellulitis   . Cholelithiasis   . Essential hypertension   . History of stress test    a. 12/2008 Ex MV: EF 78%, no ischemia.  . Insomnia   . Osteoarthritis   . Tachycardia   . Valvular heart disease    a. 12/2008 Echo: EF 65%, mild MR;  b. 2/6 SEM RUSB - insignificant murmur, prob Ao Sclerosis.    Past Surgical History:  Procedure Laterality Date  . BACK SURGERY    . BUNIONECTOMY    . CHOLECYSTECTOMY  03/29/2007  . GALLBLADDER SURGERY    . HIP SURGERY     bilateral   . REPLACEMENT TOTAL KNEE Right   . THUMB ARTHROSCOPY      There were no vitals filed for this visit.  Subjective Assessment - 02/11/18 1309    Subjective  Patient reports she is walking without crutch more at home and using crutch outside the home. She reports she has noticed a significant change in her progress for the better this week. She is using medication to control pain at night to help her sleep.     Pertinent History  TKA right LE 01/04/2018. long history of right knee pain prior to this surgery  and has had bilateral THA 2010, 2011. she has had home health therapy and is now referred to out patient therapy     Limitations  Sitting;Standing;Walking;House hold activities    How long can you sit comfortably?  10 min    How long can you stand comfortably?  <10 min    How long can you walk comfortably?  short distances    Patient Stated Goals  to return to prior level of function and be able to take care of her dog, walking dog     Currently in Pain?  No/denies        Objective: AAROM: right knee flexion 0-100 flexion up to 103 degrees following treatment  Gait: ambulating independently without AD with normalizing gait pattern Palpation: decreased soft tissue mobility right knee incision distal > proximal   Treatment:  Manual Therapy: 13 min STM performed to right knee, quadriceps, knee soft tissue around incision with patient seated at edge of treatment table with LE supported  Therapeutic exercise: patient performed with demonstration, verbal cues of therapist: goal: independent with home program, improve ROM, function with daily activities  Isometric right knee flexion x 10 reps, 5 second holds step  ups onto balance stones 10 reps leading with each LE side stepping on balance beam x 2 min Stair training reciprocal ascending and one step at a time descending: patient had difficulty with stepping up with right LE, improved with repetition   Nu step 5 min with monitoring pain and ROM; level #3 intensity   Patient response to treatment: patient demonstrated improved technique with exercises with minimal VC for correct alignment. Patient with improved soft tissue elasticity by 30% following STM. Improved motor control with repetition and cuing           PT Education - 02/11/18 1310    Education provided  Yes    Education Details  exercise instrucion for technique    Person(s) Educated  Patient    Methods  Explanation;Demonstration;Verbal cues    Comprehension  Verbalized  understanding;Verbal cues required;Returned demonstration          PT Long Term Goals - 01/26/18 1915      PT LONG TERM GOAL #1   Title  FOTO score improved by 10 points  indicating improvement with functional tasks involving right LE    Status  New    Target Date  02/16/18      PT LONG TERM GOAL #2   Title  FOTO score improved by 20 or more  indicating improvement with functional tasks involving right LE    Status  New    Target Date  03/09/18      PT LONG TERM GOAL #3   Title  Pt will be independent with home exercise and pain control to allow continued self managment of symptoms once discharged    Baseline  limited knowledge of appropriate exercise performance and progression.     Status  New    Target Date  03/09/18            Plan - 02/11/18 1347    Clinical Impression Statement  Patient is progressing steadily towards goals with improving ambulation and strength and decreasing pain throughout the day with improving function at home. she continues with increased swelling and pain in the evening. She should continue to progress with additional physical therapy intervention.     Rehab Potential  Good    Clinical Impairments Affecting Rehab Potential  (-)age, multiple co morbidities     PT Frequency  2x / week    PT Duration  6 weeks    PT Treatment/Interventions  Cryotherapy;Electrical Stimulation;Moist Heat;Neuromuscular re-education;Patient/family education;Manual techniques    PT Next Visit Plan  pain control, therapeutic exercises    PT Home Exercise Plan  seated knee flexion/extension with ball under her foot, quad sets, ROM exercises       Patient will benefit from skilled therapeutic intervention in order to improve the following deficits and impairments:  Decreased strength, Pain, Decreased activity tolerance, Difficulty walking, Impaired perceived functional ability, Increased muscle spasms, Decreased range of motion, Decreased endurance  Visit  Diagnosis: Muscle weakness (generalized)  Right knee pain, unspecified chronicity  Difficulty in walking, not elsewhere classified  Stiffness of right knee, not elsewhere classified     Problem List Patient Active Problem List   Diagnosis Date Noted  . Chronic constipation   . Noninfectious gastroenteritis   . Lung nodule 01/17/2018  . Anemia 01/14/2018  . Hyperglycemia 01/09/2018  . Colitis 01/09/2018  . Right knee pain 11/28/2017  . Light headedness 10/21/2017  . Healthcare maintenance 06/25/2017  . CKD (chronic kidney disease) stage 3, GFR 30-59 ml/min (HCC) 06/25/2017  .  Malaise and fatigue 01/05/2017  . PAD (peripheral artery disease) (HCC) 05/19/2016  . Valvular heart disease   . Diverticulitis 05/25/2015  . Abdominal pain 05/24/2015  . Allergic rhinitis 04/30/2015  . Absolute anemia 04/30/2015  . Anxiety 04/30/2015  . BMI 30.0-30.9,adult 04/30/2015  . Carpal tunnel syndrome 04/30/2015  . Chronic LBP 04/30/2015  . Edema extremities 04/30/2015  . Cannot sleep 04/30/2015  . Arthritis sicca 04/30/2015  . Seasonal affective disorder (HCC) 04/30/2015  . Heart murmur 06/27/2013  . Essential hypertension 06/27/2013  . Osteoarthritis 06/27/2013  . Mixed hyperlipidemia 06/27/2013  . History of repair of hip joint 07/08/2012  . Cholelithiasis without obstruction 03/22/2007    Beacher MayBrooks, Marie PT 02/12/2018, 9:24 AM  [facility] [address] [place], [place], [address] Phone: [phone]   Fax:  [phone]  Name: Alicia Mccann MRN: [id number] Date of Birth: 01-18-1933

## 2018-02-16 ENCOUNTER — Ambulatory Visit: Payer: Medicare Other | Attending: Internal Medicine | Admitting: Physical Therapy

## 2018-02-16 ENCOUNTER — Encounter: Payer: Self-pay | Admitting: Physical Therapy

## 2018-02-16 DIAGNOSIS — M6281 Muscle weakness (generalized): Secondary | ICD-10-CM | POA: Insufficient documentation

## 2018-02-16 DIAGNOSIS — M25661 Stiffness of right knee, not elsewhere classified: Secondary | ICD-10-CM | POA: Insufficient documentation

## 2018-02-16 DIAGNOSIS — M25561 Pain in right knee: Secondary | ICD-10-CM | POA: Insufficient documentation

## 2018-02-16 DIAGNOSIS — R262 Difficulty in walking, not elsewhere classified: Secondary | ICD-10-CM | POA: Diagnosis present

## 2018-02-16 NOTE — Therapy (Signed)
[facility]ORTS MEDICINE [address], [place], [address] Phone: [phone]   Fax:  [phone]  Physical Therapy Treatment  Patient Details  Name: Alicia Mccann MRN: [id number] Date of Birth: 08-Feb-1933 Referring Provider: Earlene Plater MD   Encounter Date: 02/16/2018  PT End of Session - 02/16/18 1351    Visit Number  7    Number of Visits  12    Date for PT Re-Evaluation  03/09/18    Authorization Time Period  7/10 progress report    PT Start Time  1303    PT Stop Time  1348    PT Time Calculation (min)  45 min    Equipment Utilized During Treatment  Other (comment) bilateral Lofstrand crutches    Activity Tolerance  Patient tolerated treatment well;Patient limited by pain    Behavior During Therapy  [facility] for tasks assessed/performed       Past Medical History:  Diagnosis Date  . Allergic rhinitis   . Anemia   . Anxiety   . Cellulitis   . Cholelithiasis   . Essential hypertension   . History of stress test    a. 12/2008 Ex MV: EF 78%, no ischemia.  . Insomnia   . Osteoarthritis   . Tachycardia   . Valvular heart disease    a. 12/2008 Echo: EF 65%, mild MR;  b. 2/6 SEM RUSB - insignificant murmur, prob Ao Sclerosis.    Past Surgical History:  Procedure Laterality Date  . BACK SURGERY    . BUNIONECTOMY    . CHOLECYSTECTOMY  03/29/2007  . GALLBLADDER SURGERY    . HIP SURGERY     bilateral   . REPLACEMENT TOTAL KNEE Right   . THUMB ARTHROSCOPY      There were no vitals filed for this visit.  Subjective Assessment - 02/16/18 1303    Subjective  Patient reports increased soreness and pain in left knee today. She is up and moving around more and not using crutch at home.     Pertinent History  TKA right LE 01/04/2018. long history of right knee pain prior to this surgery and has had bilateral THA 2010, 2011. she has had home health therapy and is now referred to out patient therapy     Limitations   Sitting;Standing;Walking;House hold activities    How long can you sit comfortably?  10 min    How long can you stand comfortably?  <10 min    How long can you walk comfortably?  short distances    Patient Stated Goals  to return to prior level of function and be able to take care of her dog, walking dog     Currently in Pain?  Yes    Pain Score  4     Pain Location  Knee    Pain Orientation  Right    Pain Descriptors / Indicators  Aching;Tightness    Pain Type  Acute pain;Surgical pain 01/04/2018    Pain Onset  1 to 4 weeks ago    Pain Frequency  Intermittent       Objective: AAROM: right knee flexion 0-103 flexion up to 105 degrees following treatment  Gait: ambulating independently without AD with antalgic gait pattern Palpation: decreased soft tissue mobility right LE quadriceps with spasms   Treatment:  Manual Therapy: 10 min STM performed to right knee, quadricep muscle and soft tissue around incision with patient supine with LE supported   Therapeutic  exercise: patient performed with demonstration, verbal cues of therapist: goal: independent with home program, improve ROM, function with daily activities  supine lying: AAROM with assistance of therapist hip/knee flexion right LE with isometric hamstring curls end range 5 reps 3 sets hip/knee flexion with large 45cm ball under right LE x 10 reps hip adduction with ball with glute sets and aprtial bridge x 10 reps  Modalities: Electrical stimulation: 15 min: Russian stim. 10/10 cycle applied (2) electrodes to quadriceps/VMO right LE with patient reclined with LE supported on pillow; pt. Performing quad sets with each cycle; goal muscle re education High volt estim.clincial program for muscle spasms  (2) electrodes applied to right knee medial/lateral aspect, intensity to tolerance with patient in reclined position with LE supported on pillow goal: pain, spasms    Patient response to treatment: improved soft tissue elasticity  with decreased spasms by up to 50% right quadriceps following STM. improved ROM and hamstring strength with repetition and facilitation of therapist during exercises      PT Education - 02/16/18 1330    Education provided  Yes    Education Details  exercise instruction    Person(s) Educated  Patient    Methods  Explanation;Demonstration;Verbal cues    Comprehension  Verbal cues required;Returned demonstration;Verbalized understanding          PT Long Term Goals - 01/26/18 1915      PT LONG TERM GOAL #1   Title  FOTO score improved by 10 points  indicating improvement with functional tasks involving right LE    Status  New    Target Date  02/16/18      PT LONG TERM GOAL #2   Title  FOTO score improved by 20 or more  indicating improvement with functional tasks involving right LE    Status  New    Target Date  03/09/18      PT LONG TERM GOAL #3   Title  Pt will be independent with home exercise and pain control to allow continued self managment of symptoms once discharged    Baseline  limited knowledge of appropriate exercise performance and progression.     Status  New    Target Date  03/09/18            Plan - 02/16/18 1345    Clinical Impression Statement  Patient continues steady progress towards goals with improving ROM, function with daily activities at home. She contiues with ROM and strength deficits and will require additional physicaltherapy intervention to achieve maximal functional return.     Rehab Potential  Good    Clinical Impairments Affecting Rehab Potential  (-)age, multiple co morbidities     PT Frequency  2x / week    PT Duration  6 weeks    PT Treatment/Interventions  Cryotherapy;Electrical Stimulation;Moist Heat;Neuromuscular re-education;Patient/family education;Manual techniques    PT Next Visit Plan  pain control, therapeutic exercises    PT Home Exercise Plan  seated knee flexion/extension with ball under her foot, quad sets, ROM exercises        Patient will benefit from skilled therapeutic intervention in order to improve the following deficits and impairments:  Decreased strength, Pain, Decreased activity tolerance, Difficulty walking, Impaired perceived functional ability, Increased muscle spasms, Decreased range of motion, Decreased endurance  Visit Diagnosis: Muscle weakness (generalized)  Right knee pain, unspecified chronicity  Difficulty in walking, not elsewhere classified  Stiffness of right knee, not elsewhere classified     Problem List Patient Active Problem  List   Diagnosis Date Noted  . Chronic constipation   . Noninfectious gastroenteritis   . Lung nodule 01/17/2018  . Anemia 01/14/2018  . Hyperglycemia 01/09/2018  . Colitis 01/09/2018  . Right knee pain 11/28/2017  . Light headedness 10/21/2017  . Healthcare maintenance 06/25/2017  . CKD (chronic kidney disease) stage 3, GFR 30-59 ml/min (HCC) 06/25/2017  . Malaise and fatigue 01/05/2017  . PAD (peripheral artery disease) (HCC) 05/19/2016  . Valvular heart disease   . Diverticulitis 05/25/2015  . Abdominal pain 05/24/2015  . Allergic rhinitis 04/30/2015  . Absolute anemia 04/30/2015  . Anxiety 04/30/2015  . BMI 30.0-30.9,adult 04/30/2015  . Carpal tunnel syndrome 04/30/2015  . Chronic LBP 04/30/2015  . Edema extremities 04/30/2015  . Cannot sleep 04/30/2015  . Arthritis sicca 04/30/2015  . Seasonal affective disorder (HCC) 04/30/2015  . Heart murmur 06/27/2013  . Essential hypertension 06/27/2013  . Osteoarthritis 06/27/2013  . Mixed hyperlipidemia 06/27/2013  . History of repair of hip joint 07/08/2012  . Cholelithiasis without obstruction 03/22/2007    Beacher May PT 02/16/2018, 11:18 PM  [place] [facility] PHYSICAL AND SPORTS MEDICINE [address] [place], [address] Phone: [phone]   Fax:  [phone]  Name: Alicia Mccann MRN: [id number] Date of Birth: [birth year]

## 2018-02-17 ENCOUNTER — Encounter: Payer: Self-pay | Admitting: Internal Medicine

## 2018-02-17 ENCOUNTER — Ambulatory Visit (INDEPENDENT_AMBULATORY_CARE_PROVIDER_SITE_OTHER): Payer: Medicare Other | Admitting: Internal Medicine

## 2018-02-17 VITALS — BP 120/70 | HR 79 | Temp 97.8°F | Resp 16 | Wt 156.8 lb

## 2018-02-17 DIAGNOSIS — N183 Chronic kidney disease, stage 3 unspecified: Secondary | ICD-10-CM

## 2018-02-17 DIAGNOSIS — L989 Disorder of the skin and subcutaneous tissue, unspecified: Secondary | ICD-10-CM | POA: Diagnosis not present

## 2018-02-17 DIAGNOSIS — I10 Essential (primary) hypertension: Secondary | ICD-10-CM | POA: Diagnosis not present

## 2018-02-17 DIAGNOSIS — D649 Anemia, unspecified: Secondary | ICD-10-CM | POA: Diagnosis not present

## 2018-02-17 DIAGNOSIS — K5909 Other constipation: Secondary | ICD-10-CM | POA: Diagnosis not present

## 2018-02-17 DIAGNOSIS — I739 Peripheral vascular disease, unspecified: Secondary | ICD-10-CM

## 2018-02-17 DIAGNOSIS — Z96651 Presence of right artificial knee joint: Secondary | ICD-10-CM | POA: Diagnosis not present

## 2018-02-17 DIAGNOSIS — R739 Hyperglycemia, unspecified: Secondary | ICD-10-CM

## 2018-02-17 DIAGNOSIS — F419 Anxiety disorder, unspecified: Secondary | ICD-10-CM | POA: Diagnosis not present

## 2018-02-17 DIAGNOSIS — E782 Mixed hyperlipidemia: Secondary | ICD-10-CM

## 2018-02-17 LAB — CBC WITH DIFFERENTIAL/PLATELET
BASOS PCT: 1.9 % (ref 0.0–3.0)
Basophils Absolute: 0.2 10*3/uL — ABNORMAL HIGH (ref 0.0–0.1)
EOS PCT: 2.9 % (ref 0.0–5.0)
Eosinophils Absolute: 0.3 10*3/uL (ref 0.0–0.7)
HCT: 35.7 % — ABNORMAL LOW (ref 36.0–46.0)
Hemoglobin: 11.6 g/dL — ABNORMAL LOW (ref 12.0–15.0)
LYMPHS ABS: 2.3 10*3/uL (ref 0.7–4.0)
Lymphocytes Relative: 19.7 % (ref 12.0–46.0)
MCHC: 32.6 g/dL (ref 30.0–36.0)
MCV: 89.6 fl (ref 78.0–100.0)
Monocytes Absolute: 0.8 10*3/uL (ref 0.1–1.0)
Monocytes Relative: 6.8 % (ref 3.0–12.0)
Neutro Abs: 7.9 10*3/uL — ABNORMAL HIGH (ref 1.4–7.7)
Neutrophils Relative %: 68.7 % (ref 43.0–77.0)
Platelets: 401 10*3/uL — ABNORMAL HIGH (ref 150.0–400.0)
RBC: 3.98 Mil/uL (ref 3.87–5.11)
RDW: 13.4 % (ref 11.5–15.5)
WBC: 11.4 10*3/uL — ABNORMAL HIGH (ref 4.0–10.5)

## 2018-02-17 LAB — BASIC METABOLIC PANEL
BUN: 23 mg/dL (ref 6–23)
CALCIUM: 10.1 mg/dL (ref 8.4–10.5)
CO2: 28 meq/L (ref 19–32)
Chloride: 102 mEq/L (ref 96–112)
Creatinine, Ser: 1.14 mg/dL (ref 0.40–1.20)
GFR: 48.41 mL/min — ABNORMAL LOW (ref >60.00)
GLUCOSE: 89 mg/dL (ref 70–99)
Potassium: 4.1 mEq/L (ref 3.5–5.1)
Sodium: 140 mEq/L (ref 135–145)

## 2018-02-17 LAB — LIPID PANEL
Cholesterol: 123 mg/dL (ref 0–200)
HDL: 48.4 mg/dL (ref >39.00)
LDL CALC: 40 mg/dL (ref 0–99)
NONHDL: 74.4
Total CHOL/HDL Ratio: 3
Triglycerides: 171 mg/dL — ABNORMAL HIGH (ref 0.0–149.0)
VLDL: 34.2 mg/dL (ref 0.0–40.0)

## 2018-02-17 LAB — HEPATIC FUNCTION PANEL
ALK PHOS: 82 U/L (ref 39–117)
ALT: 11 U/L (ref 0–35)
AST: 14 U/L (ref 0–37)
Albumin: 4.5 g/dL (ref 3.5–5.2)
BILIRUBIN DIRECT: 0.1 mg/dL (ref 0.0–0.3)
BILIRUBIN TOTAL: 0.4 mg/dL (ref 0.2–1.2)
Total Protein: 7.1 g/dL (ref 6.0–8.3)

## 2018-02-17 LAB — IBC PANEL
Iron: 51 ug/dL (ref 42–145)
Saturation Ratios: 13.9 % — ABNORMAL LOW (ref 20.0–50.0)
Transferrin: 263 mg/dL (ref 212.0–360.0)

## 2018-02-17 LAB — FERRITIN: FERRITIN: 72.6 ng/mL (ref 10.0–291.0)

## 2018-02-17 NOTE — Progress Notes (Signed)
Patient ID: Alicia Mccann, female   DOB: [birth year], 84 y.o.   MRN: [id number]   Subjective:    Patient ID: Alicia Mccann, female    DOB: [birth year], 84 y.o.   MRN: [id number]  HPI  Patient here for a scheduled follow up.  She is 7 weeks s/p total knee replacement.  Is doing better.  Now staying by herself at home.  Still doing exercises - therapy.  Eating regular meals.  Eating better.  Appetite better.  No chest pain. Breathing stable.  No acid reflux.  No abdominal pain.  Bowels moving.  Due to f/u with Dr Claiborne Billings (surgery0 - 03/02/18.  She stopped her iron last week.  Having some issues with constipation.  Overall feels better.  Has lost weight.     Past Medical History:  Diagnosis Date  . Allergic rhinitis   . Anemia   . Anxiety   . Cellulitis   . Cholelithiasis   . Essential hypertension   . History of stress test    a. 12/2008 Ex MV: EF 78%, no ischemia.  . Insomnia   . Osteoarthritis   . Tachycardia   . Valvular heart disease    a. 12/2008 Echo: EF 65%, mild MR;  b. 2/6 SEM RUSB - insignificant murmur, prob Ao Sclerosis.   Past Surgical History:  Procedure Laterality Date  . BACK SURGERY    . BUNIONECTOMY    . CHOLECYSTECTOMY  03/29/2007  . GALLBLADDER SURGERY    . HIP SURGERY     bilateral   . REPLACEMENT TOTAL KNEE Right   . THUMB ARTHROSCOPY     Family History  Problem Relation Age of Onset  . Heart attack Father   . Stroke Father   . Heart disease Father   . CAD Father   . Heart attack Son 56       MI  . Hypertension Son   . Hypertension Mother   . Heart disease Mother   . Stroke Mother   . Breast cancer Neg Hx    Social History   Socioeconomic History  . Marital status: Widowed    Spouse name: Not on file  . Number of children: Not on file  . Years of education: Not on file  . Highest education level: Not on file  Occupational History  . Not on file  Social Needs  . Financial resource strain: Not on file  . Food insecurity:    Worry: Not  on file    Inability: Not on file  . Transportation needs:    Medical: Not on file    Non-medical: Not on file  Tobacco Use  . Smoking status: Never Smoker  . Smokeless tobacco: Never Used  Substance and Sexual Activity  . Alcohol use: No  . Drug use: No  . Sexual activity: Not Currently  Lifestyle  . Physical activity:    Days per week: Not on file    Minutes per session: Not on file  . Stress: Not on file  Relationships  . Social connections:    Talks on phone: Not on file    Gets together: Not on file    Attends religious service: Not on file    Active member of club or organization: Not on file    Attends meetings of clubs or organizations: Not on file    Relationship status: Not on file  Other Topics Concern  . Not on file  Social History Narrative  . Not on file  Outpatient Encounter Medications as of 02/17/2018  Medication Sig  . acetaminophen (TYLENOL) 500 MG tablet Take 1,000 mg by mouth every 8 (eight) hours as needed for mild pain or moderate pain.  Marland Kitchen ALPRAZolam (XANAX) 0.25 MG tablet TAKE 1 TABLET BY MOUTH AT BEDTIME AS NEEDED  . amLODipine (NORVASC) 2.5 MG tablet TAKE 1 TABLET BY MOUTH DAILY  . cholecalciferol (VITAMIN D) 1000 UNITS tablet Take 1,000 Units by mouth every other day.   . cyanocobalamin ([employer] VITAMIN B12) 2000 MCG tablet Take by mouth.  . mupirocin ointment (BACTROBAN) 2 % Apply to affected area bid  . rosuvastatin (CRESTOR) 10 MG tablet TAKE 1 TABLET BY MOUTH DAILY  . traMADol (ULTRAM) 50 MG tablet    No facility-administered encounter medications on file as of 02/17/2018.     Review of Systems  Constitutional:       Weight is down.  She is now eating better.  Appetite is better.    HENT: Negative for congestion and sinus pressure.   Respiratory: Negative for cough, chest tightness and shortness of breath.   Cardiovascular: Negative for chest pain, palpitations and leg swelling.  Gastrointestinal: Negative for abdominal pain, diarrhea and  nausea.  Genitourinary: Negative for difficulty urinating and dysuria.  Musculoskeletal: Negative for myalgias.       Knee - swelling improved.  Increased rom.   Skin: Negative for color change and rash.  Neurological: Negative for dizziness, light-headedness and headaches.  Psychiatric/Behavioral: Negative for agitation and dysphoric mood.       Objective:    Physical Exam  Constitutional: She appears well-developed and well-nourished. No distress.  HENT:  Nose: Nose normal.  Mouth/Throat: Oropharynx is clear and moist.  Neck: Neck supple. No thyromegaly present.  Cardiovascular: Normal rate and regular rhythm.  Pulmonary/Chest: Breath sounds normal. No respiratory distress. She has no wheezes.  Abdominal: Soft. Bowel sounds are normal. There is no tenderness.  Musculoskeletal: She exhibits no tenderness.  Well healed incision site.  No increased erythema.    Lymphadenopathy:    She has no cervical adenopathy.  Skin: No rash noted. No erythema.  Psychiatric: She has a normal mood and affect. Her behavior is normal.    BP 120/70 (BP Location: Left Arm, Patient Position: Sitting, Cuff Size: Normal)   Pulse 79   Temp 97.8 F (36.6 C) (Oral)   Resp 16   Wt 156 lb 12.8 oz (71.1 kg)   SpO2 98%   BMI 29.63 kg/m  Wt Readings from Last 3 Encounters:  02/17/18 156 lb 12.8 oz (71.1 kg)  01/14/18 162 lb 3.2 oz (73.6 kg)  01/11/18 174 lb 9.6 oz (79.2 kg)     Lab Results  Component Value Date   WBC 11.4 (H) 02/17/2018   HGB 11.6 (L) 02/17/2018   HCT 35.7 (L) 02/17/2018   PLT 401.0 (H) 02/17/2018   GLUCOSE 89 02/17/2018   CHOL 123 02/17/2018   TRIG 171.0 (H) 02/17/2018   HDL 48.40 02/17/2018   LDLCALC 40 02/17/2018   ALT 11 02/17/2018   AST 14 02/17/2018   NA 140 02/17/2018   K 4.1 02/17/2018   CL 102 02/17/2018   CREATININE 1.14 02/17/2018   BUN 23 02/17/2018   CO2 28 02/17/2018   TSH 1.76 10/19/2017   INR 0.9 01/16/2009    Dg Chest 2 View  Result Date:  01/09/2018 CLINICAL DATA:  Right knee replacement 1 week ago. Since the surgery, patient states she can't walk, food does not taste the stain,  bowel movements are black, and vomiting. Shortness of breath. EXAM: CHEST - 2 VIEW COMPARISON:  02/06/2011 FINDINGS: Heart size and pulmonary vascularity are normal. Slight linear fibrosis or atelectasis in the lung bases. No consolidation or airspace disease. No blunting of costophrenic angles. No pneumothorax. Mediastinal contours appear intact. Calcification of the aorta. Degenerative changes in the spine. IMPRESSION: Linear atelectasis or fibrosis in the lung bases. No active consolidation. Electronically Signed   By: Lucienne Capers M.D.   On: 01/09/2018 05:25   Ct Angio Chest Pe W And/or Wo Contrast  Result Date: 01/09/2018 CLINICAL DATA:  Abdominal cramping, constipation. Positive D-dimer level. EXAM: CT ANGIOGRAPHY CHEST WITH CONTRAST TECHNIQUE: Multidetector CT imaging of the chest was performed using the standard protocol during bolus administration of intravenous contrast. Multiplanar CT image reconstructions and MIPs were obtained to evaluate the vascular anatomy. CONTRAST:  55m ISOVUE-370 IOPAMIDOL (ISOVUE-370) INJECTION 76% COMPARISON:  Radiographs of same day. FINDINGS: Cardiovascular: Satisfactory opacification of the pulmonary arteries to the segmental level. No evidence of pulmonary embolism. Normal heart size. No pericardial effusion. Atherosclerosis of thoracic aorta is noted without aneurysm or dissection. Coronary artery calcifications are noted. Mediastinum/Nodes: No enlarged mediastinal, hilar, or axillary lymph nodes. Thyroid gland, trachea, and esophagus demonstrate no significant findings. Lungs/Pleura: No pneumothorax or pleural effusion is noted. 6 mm nodule seen in left lower lobe best seen on image number 54 of series 6. Right lung is clear. Upper Abdomen: Visualized portion of splenic flexure appears to be dilated. Musculoskeletal: No  chest wall abnormality. No acute or significant osseous findings. Review of the MIP images confirms the above findings. IMPRESSION: No definite evidence of pulmonary embolus. Coronary artery calcifications are noted suggesting coronary artery disease. 6 mm nodule seen in left lower lobe. Non-contrast chest CT at 6-12 months is recommended. If the nodule is stable at time of repeat CT, then future CT at 18-24 months (from today's scan) is considered optional for low-risk patients, but is recommended for high-risk patients. This recommendation follows the consensus statement: Guidelines for Management of Incidental Pulmonary Nodules Detected on CT Images: From the Fleischner Society 2017; Radiology 2017; 284:228-243. Splenic flexure of colon appears to be dilated in the visualized portion of upper abdomen. Colonic inflammation or obstruction cannot be excluded. Aortic Atherosclerosis (ICD10-I70.0). Electronically Signed   By: JMarijo Conception M.D.   On: 01/09/2018 10:56   Ct Abdomen Pelvis W Contrast  Result Date: 01/09/2018 CLINICAL DATA:  Acute generalized abdominal pain. EXAM: CT ABDOMEN AND PELVIS WITH CONTRAST TECHNIQUE: Multidetector CT imaging of the abdomen and pelvis was performed using the standard protocol following bolus administration of intravenous contrast. CONTRAST:  759mISOVUE-370 IOPAMIDOL (ISOVUE-370) INJECTION 76% COMPARISON:  CT scan of March 24, 2015. FINDINGS: Lower chest: No acute abnormality. Hepatobiliary: No focal liver abnormality is seen. Status post cholecystectomy. No biliary dilatation. Pancreas: Unremarkable. No pancreatic ductal dilatation or surrounding inflammatory changes. Spleen: Normal in size without focal abnormality. Adrenals/Urinary Tract: Adrenal glands appear normal. Small bilateral renal cysts are noted. No hydronephrosis or renal obstruction is noted. No renal or ureteral calculi are noted. Urinary bladder is unremarkable. Stomach/Bowel: Sigmoid diverticulosis is  noted. No small bowel dilatation is noted. Stomach is unremarkable. The appendix is not clearly visualized. Focal wall thickening is seen involving proximal sigmoid colon and distal descending colon concerning for focal colitis. This most likely is inflammatory or infectious in etiology. There is significant dilatation of the more proximal large bowel suggesting some degree of obstruction. Vascular/Lymphatic: Aortic atherosclerosis. No enlarged  abdominal or pelvic lymph nodes. Reproductive: Uterus and bilateral adnexa are unremarkable. Other: No abdominal wall hernia or abnormality. No abdominopelvic ascites. Musculoskeletal: Status post bilateral hip arthroplasties. Postsurgical and degenerative changes are noted in the lumbar spine. No acute osseous abnormality is noted. IMPRESSION: Wall thickening is seen involving distal descending colon and proximal sigmoid colon concerning for infectious or inflammatory colitis. There does appear to be dilatation of the more proximal large bowel suggesting some degree of obstruction as a result. Sigmoid diverticulosis is noted, but no definite evidence of inflamed diverticulum are noted. Aortic Atherosclerosis (ICD10-I70.0). Electronically Signed   By: Marijo Conception, M.D.   On: 01/09/2018 11:09   US Venous Img Lower Unilateral Right  Result Date: 01/09/2018 CLINICAL DATA:  Pain x5 days EXAM: RIGHT LOWER EXTREMITY VENOUS DOPPLER ULTRASOUND TECHNIQUE: Gray-scale sonography with compression, as well as color and duplex ultrasound, were performed to evaluate the deep venous system from the level of the common femoral vein through the popliteal and proximal calf veins. COMPARISON:  02/06/2011 FINDINGS: Normal compressibility of the common femoral, superficial femoral, and popliteal veins, as well as the proximal calf veins. No filling defects to suggest DVT on grayscale or color Doppler imaging. Doppler waveforms show normal direction of venous flow, normal respiratory  phasicity and response to augmentation. Survey views of the contralateral common femoral vein are unremarkable. IMPRESSION: No evidence of right lower extremity deep vein thrombosis. Electronically Signed   By: Lucrezia Europe M.D.   On: 01/09/2018 10:13   Dg Knee Complete 4 Views Right  Result Date: 01/09/2018 CLINICAL DATA:  Right knee replacement 1 week ago with multiple complaints since then. EXAM: RIGHT KNEE - COMPLETE 4+ VIEW COMPARISON:  MRI right knee 03/16/2017 FINDINGS: Postoperative right knee replacement. Patella femoral component is present. Components appear well seated although oblique positioning on the lateral view limits evaluation. Skin clips and soft tissue gas are consistent with recent surgery. No evidence of acute fracture or dislocation. IMPRESSION: Right total knee arthroplasty. Components appear well seated and no acute complication is suggested. Electronically Signed   By: Lucienne Capers M.D.   On: 01/09/2018 05:27   Dg Abdomen Acute W/chest  Result Date: 01/09/2018 CLINICAL DATA:  Fever, acute generalized abdominal pain. EXAM: DG ABDOMEN ACUTE W/ 1V CHEST COMPARISON:  Radiographs of same day. FINDINGS: There is no evidence of dilated bowel loops or free intraperitoneal air. Status post cholecystectomy. Phleboliths are noted in the pelvis. Heart size and mediastinal contours are within normal limits. Both lungs are clear. IMPRESSION: No evidence of bowel obstruction or ileus. No acute cardiopulmonary disease. Electronically Signed   By: Marijo Conception, M.D.   On: 01/09/2018 09:31       Assessment & Plan:   Problem List Items Addressed This Visit    Anemia - Primary    Has been on iron.  Bowels moving better since stopped.  Recheck cbc and ferritin today.  May need to start [place].        Relevant Medications   cyanocobalamin ([facility] VITAMIN B12) 2000 MCG tablet   Other Relevant Orders   Fecal occult blood, imunochemical   Anxiety    Has xanax as needed.  Doing better.   Follow.       Chronic constipation    Has issues with constipation.  Doing better since off iron.  May need to try integra.  Follow.        CKD (chronic kidney disease) stage 3, GFR 30-59 ml/min (HCC)  Recheck GFR/creatinine today.  Avoid antiinflammatories.        Essential hypertension    Blood pressure under good control.  Continue same medication regimen.  Follow pressures.  Follow metabolic panel.        History of total right knee replacement    Doing better.  Ambulating better.  Has f/u with ortho 03/02/18.        Hyperglycemia    Low carb diet and exercise.  Follow met b and a1c.        Mixed hyperlipidemia    On crestor.  Low cholesterol diet and exercise.  Follow lipid panel and liver function tests.        PAD (peripheral artery disease) ([place])    Continue crestor and aspirin.         Other Visit Diagnoses    Chest skin lesion       Apply bactroban.  notify me if persistent.        Einar Pheasant, MD

## 2018-02-19 ENCOUNTER — Other Ambulatory Visit: Payer: Self-pay | Admitting: Internal Medicine

## 2018-02-19 ENCOUNTER — Other Ambulatory Visit: Payer: Self-pay

## 2018-02-19 DIAGNOSIS — N183 Chronic kidney disease, stage 3 unspecified: Secondary | ICD-10-CM

## 2018-02-19 DIAGNOSIS — D649 Anemia, unspecified: Secondary | ICD-10-CM

## 2018-02-19 MED ORDER — INTEGRA 62.5-62.5-40-3 MG PO CAPS: 1.0000 | ORAL_CAPSULE | Freq: Every day | ORAL | 1 refills | Status: DC

## 2018-02-19 NOTE — Progress Notes (Signed)
Orders placed for f/u labs.  

## 2018-02-21 ENCOUNTER — Encounter: Payer: Self-pay | Admitting: Internal Medicine

## 2018-02-21 MED ORDER — MUPIROCIN 2 % EX OINT: TOPICAL_OINTMENT | CUTANEOUS | 0 refills | Status: DC

## 2018-02-21 NOTE — Assessment & Plan Note (Signed)
Has issues with constipation.  Doing better since off iron.  May need to try integra.  Follow.

## 2018-02-21 NOTE — Assessment & Plan Note (Signed)
Low carb diet and exercise.  Follow met b and a1c.   

## 2018-02-21 NOTE — Assessment & Plan Note (Signed)
Doing better.  Ambulating better.  Has f/u with ortho 03/02/18.

## 2018-02-21 NOTE — Assessment & Plan Note (Signed)
Blood pressure under good control.  Continue same medication regimen.  Follow pressures.  Follow metabolic panel.   

## 2018-02-21 NOTE — Assessment & Plan Note (Signed)
Continue crestor and aspirin.  

## 2018-02-21 NOTE — Assessment & Plan Note (Signed)
Has xanax as needed.  Doing better.  Follow.

## 2018-02-21 NOTE — Assessment & Plan Note (Signed)
On crestor.  Low cholesterol diet and exercise.  Follow lipid panel and liver function tests.   

## 2018-02-21 NOTE — Assessment & Plan Note (Signed)
Has been on iron.  Bowels moving better since stopped.  Recheck cbc and ferritin today.  May need to start integra.

## 2018-02-21 NOTE — Assessment & Plan Note (Signed)
Recheck GFR/creatinine today.  Avoid antiinflammatories.

## 2018-02-22 ENCOUNTER — Ambulatory Visit: Payer: Medicare Other | Admitting: Physical Therapy

## 2018-02-22 ENCOUNTER — Encounter: Payer: Self-pay | Admitting: Physical Therapy

## 2018-02-22 DIAGNOSIS — R262 Difficulty in walking, not elsewhere classified: Secondary | ICD-10-CM

## 2018-02-22 DIAGNOSIS — M25561 Pain in right knee: Secondary | ICD-10-CM

## 2018-02-22 DIAGNOSIS — M6281 Muscle weakness (generalized): Secondary | ICD-10-CM | POA: Diagnosis not present

## 2018-02-22 DIAGNOSIS — M25661 Stiffness of right knee, not elsewhere classified: Secondary | ICD-10-CM

## 2018-02-23 ENCOUNTER — Other Ambulatory Visit: Payer: Self-pay | Admitting: Internal Medicine

## 2018-02-23 NOTE — Therapy (Signed)
[facility] PHYSICAL AND SPORTS MEDICINE [address] [place], [place], [address] Phone: [phone]   Fax:  [phone]  Physical Therapy Treatment  Patient Details  Name: Alicia Mccann MRN: [id number] Date of Birth: 1932-09-26 Referring Provider: Earlene PlaterKelley, Scott S. MD   Encounter Date: 02/22/2018  PT End of Session - 02/22/18 1501    Visit Number  8    Number of Visits  12    Date for PT Re-Evaluation  03/09/18    Authorization Time Period  8/10 progress report    PT Start Time  1433    PT Stop Time  1515    PT Time Calculation (min)  42 min    Equipment Utilized During Treatment  Other (comment) bilateral [place] crutches    Activity Tolerance  Patient tolerated treatment well;Patient limited by pain    Behavior During Therapy  [facility] for tasks assessed/performed       Past Medical History:  Diagnosis Date  . Allergic rhinitis   . Anemia   . Anxiety   . Cellulitis   . Cholelithiasis   . Essential hypertension   . History of stress test    a. 12/2008 Ex MV: EF 78%, no ischemia.  . Insomnia   . Osteoarthritis   . Tachycardia   . Valvular heart disease    a. 12/2008 Echo: EF 65%, mild MR;  b. 2/6 SEM RUSB - insignificant murmur, prob Ao Sclerosis.    Past Surgical History:  Procedure Laterality Date  . BACK SURGERY    . BUNIONECTOMY    . CHOLECYSTECTOMY  03/29/2007  . GALLBLADDER SURGERY    . HIP SURGERY     bilateral   . REPLACEMENT TOTAL KNEE Right   . THUMB ARTHROSCOPY      There were no vitals filed for this visit.  Subjective Assessment - 02/22/18 1440    Subjective  Patient reports she continues with pain in right knee with increased activity    Pertinent History  TKA right LE 01/04/2018. long history of right knee pain prior to this surgery and has had bilateral THA 2010, 2011. she has had home health therapy and is now referred to out patient therapy     Limitations  Sitting;Standing;Walking;House hold activities     How long can you sit comfortably?  10 min    How long can you stand comfortably?  <10 min    How long can you walk comfortably?  short distances    Patient Stated Goals  to return to prior level of function and be able to take care of her dog, walking dog     Currently in Pain?  No/denies no pain but knows it's there          Objective: AAROM: right knee flexion 0-105 flexion up to 108 degrees following treatment  Gait: ambulating independently without AD with mild antalgic gait pattern Palpation: decreased soft tissue mobility right LE quadriceps with spasms laterally, improved from previous session and decreased mobility distal incision around patella   Treatment:  Manual Therapy: 15 min STM performed to right knee, quadricep muscle and soft tissue around incision with patient sitting ar edge of treatment with LE supported   Therapeutic exercise: patient performed with demonstration, verbal cues of therapist: goal: independent with home program, improve ROM, function with daily activities  sitting: AAROM with assistance of therapist knee flexion right LE with isometric hamstring curls end range 10 reps  standing stair climbing x 4  sets 4 steps reciprocal ascending and leading with right LE descending with verbal cues for correct sequencing Knee extension x 15 reps with guided motion  Gait exercises to improve gait pattern with isolated hip/knee flexion for swing through  Patient response to treatment: improved ROM with improved soft tissue elasticity following STM. improved motor control and stair climbing with repetition and verbal cuing for sequencing. improved ROM and hamstring strength with repetition and facilitation of therapist with exercises        PT Education - 02/22/18 1458    Education provided  Yes    Education Details  exercise instruction; stair climbing with reciprocal ascending and descending one step at a time      Person(s) Educated  Patient    Methods   Explanation;Demonstration;Verbal cues    Comprehension  Verbalized understanding;Returned demonstration;Verbal cues required          PT Long Term Goals - 01/26/18 1915      PT LONG TERM GOAL #1   Title  FOTO score improved by 10 points  indicating improvement with functional tasks involving right LE    Status  New    Target Date  02/16/18      PT LONG TERM GOAL #2   Title  FOTO score improved by 20 or more  indicating improvement with functional tasks involving right LE    Status  New    Target Date  03/09/18      PT LONG TERM GOAL #3   Title  Pt will be independent with home exercise and pain control to allow continued self managment of symptoms once discharged    Baseline  limited knowledge of appropriate exercise performance and progression.     Status  New    Target Date  03/09/18            Plan - 02/22/18 1502    Clinical Impression Statement  Patient continues steady progress towards goals with improvement noted in ROM, ability to walk with less difficulty without AD. She continues with limitation of pain, stiffness right knee which limits function aand will benefit from continued physical therapy intervention.     Rehab Potential  Good    Clinical Impairments Affecting Rehab Potential  (-)age, multiple co morbidities     PT Frequency  2x / week    PT Duration  6 weeks    PT Treatment/Interventions  Cryotherapy;Electrical Stimulation;Moist Heat;Neuromuscular re-education;Patient/family education;Manual techniques    PT Next Visit Plan  pain control, therapeutic exercises    PT Home Exercise Plan  seated knee flexion/extension with ball under her foot, quad sets, ROM exercises       Patient will benefit from skilled therapeutic intervention in order to improve the following deficits and impairments:  Decreased strength, Pain, Decreased activity tolerance, Difficulty walking, Impaired perceived functional ability, Increased muscle spasms, Decreased range of motion,  Decreased endurance  Visit Diagnosis: Muscle weakness (generalized)  Right knee pain, unspecified chronicity  Difficulty in walking, not elsewhere classified  Stiffness of right knee, not elsewhere classified     Problem List Patient Active Problem List   Diagnosis Date Noted  . History of total right knee replacement 01/21/2018  . Chronic constipation   . Noninfectious gastroenteritis   . Lung nodule 01/17/2018  . Anemia 01/14/2018  . Hyperglycemia 01/09/2018  . Colitis 01/09/2018  . Right knee pain 11/28/2017  . Light headedness 10/21/2017  . Healthcare maintenance 06/25/2017  . CKD (chronic kidney disease) stage 3, GFR 30-59 ml/min (  HCC) 06/25/2017  . Malaise and fatigue 01/05/2017  . PAD (peripheral artery disease) (HCC) 05/19/2016  . Valvular heart disease   . Diverticulitis 05/25/2015  . Abdominal pain 05/24/2015  . Allergic rhinitis 04/30/2015  . Absolute anemia 04/30/2015  . Anxiety 04/30/2015  . Carpal tunnel syndrome 04/30/2015  . Chronic LBP 04/30/2015  . Edema extremities 04/30/2015  . Cannot sleep 04/30/2015  . Arthritis sicca 04/30/2015  . Seasonal affective disorder (HCC) 04/30/2015  . Heart murmur 06/27/2013  . Essential hypertension 06/27/2013  . Osteoarthritis 06/27/2013  . Mixed hyperlipidemia 06/27/2013  . History of repair of hip joint 07/08/2012  . Cholelithiasis without obstruction 03/22/2007    Beacher May PT 02/23/2018, 8:42 PM  [facility] AND SPORTS MEDICINE [address] [place], [address] Phone: [phone]   Fax:  [phone]  Name: Alicia Mccann MRN: [id number] Date of Birth: 18-Feb-1933

## 2018-02-24 ENCOUNTER — Encounter: Payer: Self-pay | Admitting: Physical Therapy

## 2018-02-24 ENCOUNTER — Ambulatory Visit: Payer: Medicare Other | Admitting: Physical Therapy

## 2018-02-24 DIAGNOSIS — R262 Difficulty in walking, not elsewhere classified: Secondary | ICD-10-CM

## 2018-02-24 DIAGNOSIS — M6281 Muscle weakness (generalized): Secondary | ICD-10-CM

## 2018-02-24 DIAGNOSIS — M25661 Stiffness of right knee, not elsewhere classified: Secondary | ICD-10-CM

## 2018-02-24 DIAGNOSIS — M25561 Pain in right knee: Secondary | ICD-10-CM

## 2018-02-24 NOTE — Therapy (Signed)
[facility] PHYS[facility] AND [facility] MEDICINE [address], [place], [address] Phone: [phone]   Fax:  [phone]  Physical Therapy Treatment  Patient Details  Name: Alicia Mccann MRN: [id number] Date of Birth: Jan 15, 1933 Referring Provider: Earlene Plater MD   Encounter Date: 02/24/2018  PT End of Session - 02/24/18 1607    Visit Number  9    Number of Visits  12    Date for PT Re-Evaluation  03/09/18    Authorization Time Period  9/10 progress report    PT Start Time  1403    PT Stop Time  1445    PT Time Calculation (min)  42 min    Activity Tolerance  Patient tolerated treatment well;Patient limited by pain    Behavior During Therapy  [facility] for tasks assessed/performed       Past Medical History:  Diagnosis Date  . Allergic rhinitis   . Anemia   . Anxiety   . Cellulitis   . Cholelithiasis   . Essential hypertension   . History of stress test    a. 12/2008 Ex MV: EF 78%, no ischemia.  . Insomnia   . Osteoarthritis   . Tachycardia   . Valvular heart disease    a. 12/2008 Echo: EF 65%, mild MR;  b. 2/6 SEM RUSB - insignificant murmur, prob Ao Sclerosis.    Past Surgical History:  Procedure Laterality Date  . BACK SURGERY    . BUNIONECTOMY    . CHOLECYSTECTOMY  03/29/2007  . GALLBLADDER SURGERY    . HIP SURGERY     bilateral   . REPLACEMENT TOTAL KNEE Right   . THUMB ARTHROSCOPY      There were no vitals filed for this visit.  Subjective Assessment - 02/24/18 1412    Subjective  Patient reports she continues with nagging, annoying dull aching in right knee and is up and active due to living alone and having to care for her home and personal needs.     Pertinent History  TKA right LE 01/04/2018. long history of right knee pain prior to this surgery and has had bilateral THA 2010, 2011. she has had home health therapy and is now referred to out patient therapy     Limitations  Sitting;Standing;Walking;House hold  activities    How long can you sit comfortably?  10 min    How long can you stand comfortably?  <10 min    How long can you walk comfortably?  short distances    Patient Stated Goals  to return to prior level of function and be able to take care of her dog, walking dog     Currently in Pain?  Other (Comment) dull nagging ache, no number given          Objective: AAROM: right knee flexion 0-105 flexion up to 110 degrees following treatment  Gait: ambulating independently without AD with mild antalgic gait pattern Palpation: decreased soft tissue mobility right LE quadriceps with spasms laterally   Treatment:  Manual Therapy: 23 min STM performed to right knee, quadricep muscle and soft tissue around incision with patient supine with right LE supported on pillow; goal: improve ROM, pain, improve soft tissu elasticity   Therapeutic exercise: patient performed with demonstration, verbal cues of therapist: goal: independent with home program, improve ROM, function with daily activities  supine: hip and knee flexion with assist of therapist 3 x 10 reps with isometric knee flexion end range 3  x 5 reps each cycle; mild to moderate resistance given   sitting: AAROM with assistance of therapist knee flexion right LE x 5 reps   standing:  step ups onto airex balance pad leading with each LE x 10 reps with VC and guidance to perform isolated hip/knee flexion and avoid hip abduction/circumduction    Patient response to treatment: improved AAROM to 110 degrees right knee flexion and improved osft tissue elastictiy following STM. improved motor control with step ups with repetition and repeated VC. Improved ROM and hamstring strength with repetition and facilitation of therapist with exercises           PT Education - 02/24/18 1607    Education provided  Yes    Education Details  discussed pain control and joint protection to continue healing of knee    Person(s) Educated  Patient     Methods  Explanation    Comprehension  Verbalized understanding          PT Long Term Goals - 01/26/18 1915      PT LONG TERM GOAL #1   Title  FOTO score improved by 10 points  indicating improvement with functional tasks involving right LE    Status  New    Target Date  02/16/18      PT LONG TERM GOAL #2   Title  FOTO score improved by 20 or more  indicating improvement with functional tasks involving right LE    Status  New    Target Date  03/09/18      PT LONG TERM GOAL #3   Title  Pt will be independent with home exercise and pain control to allow continued self managment of symptoms once discharged    Baseline  limited knowledge of appropriate exercise performance and progression.     Status  New    Target Date  03/09/18            Plan - 02/24/18 1608    Clinical Impression Statement  Patient with decreased pain to very mild/none at end of session and was able to ambulate without pain in right knee. She continues with decreased hip/knee flexion right LE during swing through and will benefit from continued physical therapy intervention to achieve goals.     Rehab Potential  Good    Clinical Impairments Affecting Rehab Potential  (-)age, multiple co morbidities     PT Frequency  2x / week    PT Duration  6 weeks    PT Treatment/Interventions  Cryotherapy;Electrical Stimulation;Moist Heat;Neuromuscular re-education;Patient/family education;Manual techniques    PT Next Visit Plan  pain control, therapeutic exercises, manual therapy    PT Home Exercise Plan  seated knee flexion/extension with ball under her foot, quad sets, ROM exercises, elevation, heat to quadriceps       Patient will benefit from skilled therapeutic intervention in order to improve the following deficits and impairments:  Decreased strength, Pain, Decreased activity tolerance, Difficulty walking, Impaired perceived functional ability, Increased muscle spasms, Decreased range of motion, Decreased  endurance  Visit Diagnosis: Muscle weakness (generalized)  Right knee pain, unspecified chronicity  Difficulty in walking, not elsewhere classified  Stiffness of right knee, not elsewhere classified     Problem List Patient Active Problem List   Diagnosis Date Noted  . History of total right knee replacement 01/21/2018  . Chronic constipation   . Noninfectious gastroenteritis   . Lung nodule 01/17/2018  . Anemia 01/14/2018  . Hyperglycemia 01/09/2018  . Colitis 01/09/2018  .  Right knee pain 11/28/2017  . Light headedness 10/21/2017  . Healthcare maintenance 06/25/2017  . CKD (chronic kidney disease) stage 3, GFR 30-59 ml/min (HCC) 06/25/2017  . Malaise and fatigue 01/05/2017  . PAD (peripheral artery disease) (HCC) 05/19/2016  . Valvular heart disease   . Diverticulitis 05/25/2015  . Abdominal pain 05/24/2015  . Allergic rhinitis 04/30/2015  . Absolute anemia 04/30/2015  . Anxiety 04/30/2015  . Carpal tunnel syndrome 04/30/2015  . Chronic LBP 04/30/2015  . Edema extremities 04/30/2015  . Cannot sleep 04/30/2015  . Arthritis sicca 04/30/2015  . Seasonal affective disorder (HCC) 04/30/2015  . Heart murmur 06/27/2013  . Essential hypertension 06/27/2013  . Osteoarthritis 06/27/2013  . Mixed hyperlipidemia 06/27/2013  . History of repair of hip joint 07/08/2012  . Cholelithiasis without obstruction 03/22/2007    Beacher May PT 02/24/2018, 4:19 PM  [facility] PHYSICAL AND SPORTS MEDICINE [address] [place], [address] Phone: [phone]   Fax:  [phone]  Name: Alicia Mccann MRN: [id number] Date of Birth: Dec 26, 1932

## 2018-02-25 ENCOUNTER — Other Ambulatory Visit: Payer: Self-pay | Admitting: Internal Medicine

## 2018-02-25 ENCOUNTER — Ambulatory Visit: Payer: Self-pay | Admitting: Internal Medicine

## 2018-02-25 NOTE — Telephone Encounter (Signed)
Pt c/o 5 episodes of loose "chunky" stools. Pt stated that she thinks it is the iron that she is taking and would like to know if she can discontinue the iron. Pt stated that there is no pain, vomiting, or dehydration. Pt stated that she urinated every time she goes to the bathroom. Other than the diarrhea, pt states she feels ok but is aggravated that she is having the diarrhea. Pt just wanting to know if she can switch to high iron foods instead of the po iron tablets. Care advice given and pt verbalized understanding. Routing back to office for review.  Reason for Disposition . MILD-MODERATE diarrhea (e.g., 1-6 times / day more than normal)  Answer Assessment - Initial Assessment Questions 1. DIARRHEA SEVERITY: "How bad is the diarrhea?" "How many extra stools have you had in the past 24 hours than normal?"    - NO DIARRHEA (SCALE 0)   - MILD (SCALE 1-3): Few loose or mushy BMs; increase of 1-3 stools over normal daily number of stools; mild increase in ostomy output.   -  MODERATE (SCALE 4-7): Increase of 4-6 stools daily over normal; moderate increase in ostomy output. * SEVERE (SCALE 8-10; OR 'WORST POSSIBLE'): Increase of 7 or more stools daily over normal; moderate increase in ostomy output; incontinence.     Loose - moderate 2. ONSET: "When did the diarrhea begin?"      today 3. BM CONSISTENCY: "How loose or watery is the diarrhea?"      Loose chunky 4. VOMITING: "Are you also vomiting?" If so, ask: "How many times in the past 24 hours?"      no 5. ABDOMINAL PAIN: "Are you having any abdominal pain?" If yes: "What does it feel like?" (e.g., crampy, dull, intermittent, constant)      no 6. ABDOMINAL PAIN SEVERITY: If present, ask: "How bad is the pain?"  (e.g., Scale 1-10; mild, moderate, or severe)   - MILD (1-3): doesn't interfere with normal activities, abdomen soft and not tender to touch    - MODERATE (4-7): interferes with normal activities or awakens from sleep, tender to touch     - SEVERE (8-10): excruciating pain, doubled over, unable to do any normal activities       n/a 7. ORAL INTAKE: If vomiting, "Have you been able to drink liquids?" "How much fluids have you had in the past 24 hours?"   n/a 8. HYDRATION: "Any signs of dehydration?" (e.g., dry mouth [not just dry lips], too weak to stand, dizziness, new weight loss) "When did you last urinate?"  this am every time she goes to the bathroom 9. EXPOSURE: "Have you traveled to a foreign country recently?" "Have you been exposed to anyone with diarrhea?" "Could you have eaten any food that was spoiled?"     No-no-no spoiled food 10. ANTIBIOTIC USE: "Are you taking antibiotics now or have you taken antibiotics in the past 2 months?"       no 11. OTHER SYMPTOMS: "Do you have any other symptoms?" (e.g., fever, blood in stool)       no 12. PREGNANCY: "Is there any chance you are pregnant?" "When was your last menstrual period?"       n/a  Protocols used: DIARRHEA-A-AH

## 2018-02-25 NOTE — Telephone Encounter (Signed)
Please advise 

## 2018-02-25 NOTE — Telephone Encounter (Signed)
I called patient & she stated that she feels fine in general just has the diarrhea. I stated that per Dr. Lorin PicketScott is was ok to stop iron supplement. If symptoms persist to give us a call back.

## 2018-02-25 NOTE — Telephone Encounter (Signed)
I am ok if she stops the iron for now and see if symptoms improve.  I am not sure if the iron is causing the loose stool.  Usually side effect is constipation.  If persistent or worsening symptoms, needs to be evaluated.

## 2018-03-01 ENCOUNTER — Encounter: Payer: Self-pay | Admitting: Physical Therapy

## 2018-03-01 ENCOUNTER — Ambulatory Visit: Payer: Medicare Other | Admitting: Physical Therapy

## 2018-03-01 DIAGNOSIS — M6281 Muscle weakness (generalized): Secondary | ICD-10-CM | POA: Diagnosis not present

## 2018-03-01 DIAGNOSIS — M25561 Pain in right knee: Secondary | ICD-10-CM

## 2018-03-01 DIAGNOSIS — M25661 Stiffness of right knee, not elsewhere classified: Secondary | ICD-10-CM

## 2018-03-01 DIAGNOSIS — R262 Difficulty in walking, not elsewhere classified: Secondary | ICD-10-CM

## 2018-03-01 NOTE — Therapy (Signed)
[facility] PHYS[facility]TS MEDICINE [address] [place], [place], [address] Phone: [phone]   Fax:  [phone]  Physical Therapy Treatment Physical Therapy Progress Note   Dates of reporting period 01/26/2018  to 03/01/2018   Patient Details  Name: Alicia Mccann MRN: [id number] Date of Birth: 07/21/1933 Referring Provider: Earlene PlaterKelley, Scott S. MD   Encounter Date: 03/01/2018  PT End of Session - 03/01/18 1351    Visit Number  10    Number of Visits  12    Date for PT Re-Evaluation  03/09/18    Authorization Time Period  10/10 progress report    PT Start Time PT Stop Time PT time calculation (min)  1346  1430  44 min    Activity Tolerance  Patient tolerated treatment well;Patient limited by pain    Behavior During Therapy  [facility] tasks assessed/performed       Past Medical History:  Diagnosis Date  . Allergic rhinitis   . Anemia   . Anxiety   . Cellulitis   . Cholelithiasis   . Essential hypertension   . History of stress test    a. 12/2008 Ex MV: EF 78%, no ischemia.  . Insomnia   . Osteoarthritis   . Tachycardia   . Valvular heart disease    a. 12/2008 Echo: EF 65%, mild MR;  b. 2/6 SEM RUSB - insignificant murmur, prob Ao Sclerosis.    Past Surgical History:  Procedure Laterality Date  . BACK SURGERY    . BUNIONECTOMY    . CHOLECYSTECTOMY  03/29/2007  . GALLBLADDER SURGERY    . HIP SURGERY     bilateral   . REPLACEMENT TOTAL KNEE Right   . THUMB ARTHROSCOPY      There were no vitals filed for this visit.  Subjective Assessment - 03/01/18 1349    Subjective  Patient reports no pain in her right knee and is walking better with less difficulty and not using crutches any longer. She is driving more without difficulty as well.     Pertinent History  TKA right LE 01/04/2018. long history of right knee pain prior to this surgery and has had bilateral THA 2010, 2011. she has had home health therapy and is now  referred to out patient therapy     Limitations  Sitting;Standing;Walking;House hold activities    How long can you sit comfortably?  10 min    How long can you stand comfortably?  <10 min    How long can you walk comfortably?  short distances    Patient Stated Goals  to return to prior level of function and be able to take care of her dog, walking dog     Currently in Pain?  No/denies          Objective: AAROM: right knee flexion 0-105 flexion up to 110 degrees following treatment  Gait: ambulating independently without AD with mild antalgic gait pattern Palpation: decreased soft tissue mobility right LE quadriceps with spasms laterally, improved from previous session and decreased mobility distal incision around patella Outcome measure: FOTO 48/100   Treatment:  Manual Therapy: 28 min STM performed to right knee, quadricep muscle and soft tissue around incision with patient sitting at edge of treatment with LE supported   Therapeutic exercise: patient performed with demonstration, verbal cues of therapist: goal: independent with home program, improve ROM, function with daily activities  sitting: AAROM with assistance of therapist knee flexion right LE with isometric  hamstring curls end range 10 reps  standing:  step ups onto airex balance pad leading with each LE x 10 lateral step up and overs airex balance pad x 15 reps  Patient response to treatment: improved ROM with improved soft tissue elasticity following STM. improved motor control with exercises with repetition.      Education: Exercise instruction with verbal cues and demonstration Patient demonstrated exercises with verbal cues     PT Long Term Goals - 03/01/18 1555      PT LONG TERM GOAL #1   Title  FOTO score improved by 10 points  indicating improvement with functional tasks involving right LE    Baseline  FOTO 48    Status  On-going      PT LONG TERM GOAL #2   Title  FOTO score improved by 20 or more   indicating improvement with functional tasks involving right LE    Status  On-going    Target Date  03/09/18      PT LONG TERM GOAL #3   Title  Pt will be independent with home exercise and pain control to allow continued self managment of symptoms once discharged    Baseline  limited knowledge of appropriate exercise performance and progression.     Status  On-going    Target Date  03/09/18            Plan - 03/01/18 1353    Clinical Impression Statement  Patient continues steady progress towards goals. She demonstrates improved balance and functional community ambulation as indicated by of 1 m/s and TUG of 11.4 seconds. She continues with limitations in ROM right knee 0-105/110 degrees and limited function with stairs and walking long distances. She will benefit from continued physical therapy interveniton to improve deficits in order to achieve maximal function with daily tasks and be able to transition to independent self management.     Rehab Potential  Good    Clinical Impairments Affecting Rehab Potential  (-)age, multiple co morbidities     PT Frequency  2x / week    PT Duration  6 weeks    PT Treatment/Interventions  Cryotherapy;Electrical Stimulation;Moist Heat;Neuromuscular re-education;Patient/family education;Manual techniques    PT Next Visit Plan  pain control, therapeutic exercises, manual therapy    PT Home Exercise Plan  seated knee flexion/extension with ball under her foot, quad sets, ROM exercises, elevation, heat to quadriceps    Consulted and Agree with Plan of Care  Patient       Patient will benefit from skilled therapeutic intervention in order to improve the following deficits and impairments:  Decreased strength, Pain, Decreased activity tolerance, Difficulty walking, Impaired perceived functional ability, Increased muscle spasms, Decreased range of motion, Decreased endurance  Visit Diagnosis: Muscle weakness (generalized)  Right knee pain,  unspecified chronicity  Difficulty in walking, not elsewhere classified  Stiffness of right knee, not elsewhere classified     Problem List Patient Active Problem List   Diagnosis Date Noted  . History of total right knee replacement 01/21/2018  . Chronic constipation   . Noninfectious gastroenteritis   . Lung nodule 01/17/2018  . Anemia 01/14/2018  . Hyperglycemia 01/09/2018  . Colitis 01/09/2018  . Right knee pain 11/28/2017  . Light headedness 10/21/2017  . Healthcare maintenance 06/25/2017  . CKD (chronic kidney disease) stage 3, GFR 30-59 ml/min (HCC) 06/25/2017  . Malaise and fatigue 01/05/2017  . PAD (peripheral artery disease) (HCC) 05/19/2016  . Valvular heart disease   .  Diverticulitis 05/25/2015  . Abdominal pain 05/24/2015  . Allergic rhinitis 04/30/2015  . Absolute anemia 04/30/2015  . Anxiety 04/30/2015  . Carpal tunnel syndrome 04/30/2015  . Chronic LBP 04/30/2015  . Edema extremities 04/30/2015  . Cannot sleep 04/30/2015  . Arthritis sicca 04/30/2015  . Seasonal affective disorder (HCC) 04/30/2015  . Heart murmur 06/27/2013  . Essential hypertension 06/27/2013  . Osteoarthritis 06/27/2013  . Mixed hyperlipidemia 06/27/2013  . History of repair of hip joint 07/08/2012  . Cholelithiasis without obstruction 03/22/2007    Beacher May PT 03/02/2018, 10:09 PM  [facility] PHYSICAL AND SPORTS MEDICINE [address], [place], [address] Phone: [phone]   Fax:  [phone]  Name: Alicia Mccann MRN: [id number] Date of Birth: [birth year]

## 2018-03-03 ENCOUNTER — Ambulatory Visit: Payer: Medicare Other | Admitting: Physical Therapy

## 2018-03-08 ENCOUNTER — Ambulatory Visit: Payer: Medicare Other | Admitting: Physical Therapy

## 2018-03-10 ENCOUNTER — Ambulatory Visit: Payer: Medicare Other | Admitting: Physical Therapy

## 2018-03-10 ENCOUNTER — Other Ambulatory Visit (INDEPENDENT_AMBULATORY_CARE_PROVIDER_SITE_OTHER): Payer: Medicare Other

## 2018-03-10 DIAGNOSIS — D649 Anemia, unspecified: Secondary | ICD-10-CM

## 2018-03-11 ENCOUNTER — Other Ambulatory Visit (INDEPENDENT_AMBULATORY_CARE_PROVIDER_SITE_OTHER): Payer: Medicare Other

## 2018-03-11 DIAGNOSIS — D649 Anemia, unspecified: Secondary | ICD-10-CM | POA: Diagnosis not present

## 2018-03-11 DIAGNOSIS — N183 Chronic kidney disease, stage 3 unspecified: Secondary | ICD-10-CM

## 2018-03-11 LAB — BASIC METABOLIC PANEL
BUN: 27 mg/dL — ABNORMAL HIGH (ref 6–23)
CALCIUM: 9.9 mg/dL (ref 8.4–10.5)
CO2: 27 mEq/L (ref 19–32)
CREATININE: 1.1 mg/dL (ref 0.40–1.20)
Chloride: 105 mEq/L (ref 96–112)
GFR: 50.44 mL/min — ABNORMAL LOW (ref >60.00)
Glucose, Bld: 95 mg/dL (ref 70–99)
Potassium: 4.1 mEq/L (ref 3.5–5.1)
Sodium: 140 mEq/L (ref 135–145)

## 2018-03-11 LAB — FERRITIN: Ferritin: 45.1 ng/mL (ref 10.0–291.0)

## 2018-03-11 LAB — IBC PANEL
Iron: 33 ug/dL — ABNORMAL LOW (ref 42–145)
SATURATION RATIOS: 9.4 % — AB (ref 20.0–50.0)
TRANSFERRIN: 250 mg/dL (ref 212.0–360.0)

## 2018-03-11 LAB — FECAL OCCULT BLOOD, IMMUNOCHEMICAL: FECAL OCCULT BLD: NEGATIVE

## 2018-03-11 NOTE — Progress Notes (Signed)
CBC was not ran because tube can not be found. Test was reordered future.

## 2018-03-11 NOTE — Addendum Note (Signed)
Addended by: Warden FillersWRIGHT, Westin Knotts S on: 03/11/2018 04:10 PM   Modules accepted: Orders

## 2018-03-15 ENCOUNTER — Encounter: Payer: Medicare Other | Admitting: Physical Therapy

## 2018-03-16 ENCOUNTER — Ambulatory Visit (INDEPENDENT_AMBULATORY_CARE_PROVIDER_SITE_OTHER): Payer: Medicare Other

## 2018-03-16 ENCOUNTER — Encounter: Payer: Self-pay | Admitting: Internal Medicine

## 2018-03-16 ENCOUNTER — Ambulatory Visit (INDEPENDENT_AMBULATORY_CARE_PROVIDER_SITE_OTHER): Payer: Medicare Other | Admitting: Internal Medicine

## 2018-03-16 VITALS — BP 138/68 | HR 68 | Temp 97.7°F | Resp 18 | Ht 61.0 in | Wt 157.6 lb

## 2018-03-16 DIAGNOSIS — N183 Chronic kidney disease, stage 3 unspecified: Secondary | ICD-10-CM

## 2018-03-16 DIAGNOSIS — F419 Anxiety disorder, unspecified: Secondary | ICD-10-CM | POA: Diagnosis not present

## 2018-03-16 DIAGNOSIS — R911 Solitary pulmonary nodule: Secondary | ICD-10-CM

## 2018-03-16 DIAGNOSIS — I739 Peripheral vascular disease, unspecified: Secondary | ICD-10-CM

## 2018-03-16 DIAGNOSIS — D649 Anemia, unspecified: Secondary | ICD-10-CM | POA: Diagnosis not present

## 2018-03-16 DIAGNOSIS — I10 Essential (primary) hypertension: Secondary | ICD-10-CM | POA: Diagnosis not present

## 2018-03-16 DIAGNOSIS — Z0000 Encounter for general adult medical examination without abnormal findings: Secondary | ICD-10-CM

## 2018-03-16 DIAGNOSIS — R739 Hyperglycemia, unspecified: Secondary | ICD-10-CM

## 2018-03-16 DIAGNOSIS — E782 Mixed hyperlipidemia: Secondary | ICD-10-CM

## 2018-03-16 NOTE — Progress Notes (Signed)
Patient ID: Alicia Mccann, female   DOB: October 09, 1932, 84 y.o.   MRN: [id number]   Subjective:    Patient ID: Alicia Mccann, female    DOB: November 21, 1932, 84 y.o.   MRN: [id number]  HPI  Patient here for a scheduled follow up.  She reports she is doing better.  Energy is improving.  Able to do more now.  No chest pain.  Breathing stable.  No acid reflux.  No abdominal pain.  Bowels moving.  Had some increased stress related to her recent surgery.  Overall doing better.  Does not feel needs any further intervention.     Past Medical History:  Diagnosis Date  . Allergic rhinitis   . Anemia   . Anxiety   . Cellulitis   . Cholelithiasis   . Essential hypertension   . History of stress test    a. 12/2008 Ex MV: EF 78%, no ischemia.  . Insomnia   . Osteoarthritis   . Tachycardia   . Valvular heart disease    a. 12/2008 Echo: EF 65%, mild MR;  b. 2/6 SEM RUSB - insignificant murmur, prob Ao Sclerosis.   Past Surgical History:  Procedure Laterality Date  . BACK SURGERY    . BUNIONECTOMY    . CHOLECYSTECTOMY  03/29/2007  . GALLBLADDER SURGERY    . HIP SURGERY     bilateral   . REPLACEMENT TOTAL KNEE Right   . THUMB ARTHROSCOPY     Family History  Problem Relation Age of Onset  . Heart attack Father   . Stroke Father   . Heart disease Father   . CAD Father   . Heart attack Son 76       MI  . Hypertension Son   . Hypertension Mother   . Heart disease Mother   . Stroke Mother   . Breast cancer Neg Hx    Social History   Socioeconomic History  . Marital status: Widowed    Spouse name: Not on file  . Number of children: Not on file  . Years of education: Not on file  . Highest education level: Not on file  Occupational History  . Not on file  Social Needs  . Financial resource strain: Not hard at all  . Food insecurity:    Worry: Never true    Inability: Never true  . Transportation needs:    Medical: No    Non-medical: No  Tobacco Use  . Smoking status: Never  Smoker  . Smokeless tobacco: Never Used  Substance and Sexual Activity  . Alcohol use: No  . Drug use: No  . Sexual activity: Not Currently  Lifestyle  . Physical activity:    Days per week: 4 days    Minutes per session: 20 min  . Stress: Not at all  Relationships  . Social connections:    Talks on phone: Not on file    Gets together: Not on file    Attends religious service: Not on file    Active member of club or organization: Not on file    Attends meetings of clubs or organizations: Not on file    Relationship status: Not on file  Other Topics Concern  . Not on file  Social History Narrative  . Not on file    Outpatient Encounter Medications as of 03/16/2018  Medication Sig  . acetaminophen (TYLENOL) 500 MG tablet Take 1,000 mg by mouth every 8 (eight) hours as needed for mild pain or  moderate pain.  Marland Kitchen ALPRAZolam (XANAX) 0.25 MG tablet TAKE 1 TABLET BY MOUTH AT BEDTIME AS NEEDED  . amLODipine (NORVASC) 2.5 MG tablet TAKE 1 TABLET BY MOUTH DAILY  . cholecalciferol (VITAMIN D) 1000 UNITS tablet Take 1,000 Units by mouth every other day.   . cyanocobalamin ([employer] VITAMIN B12) 2000 MCG tablet Take by mouth.  . mupirocin ointment (BACTROBAN) 2 % Apply to affected area bid  . rosuvastatin (CRESTOR) 10 MG tablet TAKE 1 TABLET BY MOUTH DAILY  . traMADol (ULTRAM) 50 MG tablet   . triamterene-hydrochlorothiazide (MAXZIDE-25) 37.5-25 MG tablet TAKE 1 TABLET DAILY  . [DISCONTINUED] Fe Fum-FePoly-Vit C-Vit B3 (INTEGRA) 62.5-62.5-40-3 MG CAPS Take 1 tablet by mouth daily.   No facility-administered encounter medications on file as of 03/16/2018.     Review of Systems  Constitutional: Negative for appetite change and unexpected weight change.  HENT: Negative for congestion and sinus pressure.   Respiratory: Negative for cough, chest tightness and shortness of breath.   Cardiovascular: Negative for chest pain, palpitations and leg swelling.  Gastrointestinal: Negative for abdominal  pain, diarrhea and nausea.  Genitourinary: Negative for difficulty urinating and dysuria.  Musculoskeletal: Negative for joint swelling and myalgias.  Skin: Negative for color change and rash.  Neurological: Negative for dizziness, light-headedness and headaches.  Psychiatric/Behavioral: Negative for agitation and dysphoric mood.       Objective:    Physical Exam  Constitutional: She appears well-developed and well-nourished. No distress.  HENT:  Nose: Nose normal.  Mouth/Throat: Oropharynx is clear and moist.  Neck: Neck supple. No thyromegaly present.  Cardiovascular: Normal rate and regular rhythm.  Pulmonary/Chest: Breath sounds normal. No respiratory distress. She has no wheezes.  Abdominal: Soft. Bowel sounds are normal. There is no tenderness.  Musculoskeletal: She exhibits no edema or tenderness.  Lymphadenopathy:    She has no cervical adenopathy.  Skin: No rash noted. No erythema.  Psychiatric: She has a normal mood and affect. Her behavior is normal.    BP 138/68 (BP Location: Left Arm, Patient Position: Sitting, Cuff Size: Normal)   Pulse 68   Temp 97.7 F (36.5 C) (Oral)   Resp 18   Wt 157 lb 9.6 oz (71.5 kg)   SpO2 97%   BMI 29.78 kg/m  Wt Readings from Last 3 Encounters:  03/16/18 157 lb 9.6 oz (71.5 kg)  03/16/18 157 lb 9.6 oz (71.5 kg)  02/17/18 156 lb 12.8 oz (71.1 kg)     Lab Results  Component Value Date   WBC 11.4 (H) 02/17/2018   HGB 11.6 (L) 02/17/2018   HCT 35.7 (L) 02/17/2018   PLT 401.0 (H) 02/17/2018   GLUCOSE 95 03/11/2018   CHOL 123 02/17/2018   TRIG 171.0 (H) 02/17/2018   HDL 48.40 02/17/2018   LDLCALC 40 02/17/2018   ALT 11 02/17/2018   AST 14 02/17/2018   NA 140 03/11/2018   K 4.1 03/11/2018   CL 105 03/11/2018   CREATININE 1.10 03/11/2018   BUN 27 (H) 03/11/2018   CO2 27 03/11/2018   TSH 1.76 10/19/2017   INR 0.9 01/16/2009    Dg Chest 2 View  Result Date: 01/09/2018 CLINICAL DATA:  Right knee replacement 1 week ago.  Since the surgery, patient states she can't walk, food does not taste the stain, bowel movements are black, and vomiting. Shortness of breath. EXAM: CHEST - 2 VIEW COMPARISON:  02/06/2011 FINDINGS: Heart size and pulmonary vascularity are normal. Slight linear fibrosis or atelectasis in the lung bases. No  consolidation or airspace disease. No blunting of costophrenic angles. No pneumothorax. Mediastinal contours appear intact. Calcification of the aorta. Degenerative changes in the spine. IMPRESSION: Linear atelectasis or fibrosis in the lung bases. No active consolidation. Electronically Signed   By: Lucienne Capers M.D.   On: 01/09/2018 05:25   Ct Angio Chest Pe W And/or Wo Contrast  Result Date: 01/09/2018 CLINICAL DATA:  Abdominal cramping, constipation. Positive D-dimer level. EXAM: CT ANGIOGRAPHY CHEST WITH CONTRAST TECHNIQUE: Multidetector CT imaging of the chest was performed using the standard protocol during bolus administration of intravenous contrast. Multiplanar CT image reconstructions and MIPs were obtained to evaluate the vascular anatomy. CONTRAST:  21m ISOVUE-370 IOPAMIDOL (ISOVUE-370) INJECTION 76% COMPARISON:  Radiographs of same day. FINDINGS: Cardiovascular: Satisfactory opacification of the pulmonary arteries to the segmental level. No evidence of pulmonary embolism. Normal heart size. No pericardial effusion. Atherosclerosis of thoracic aorta is noted without aneurysm or dissection. Coronary artery calcifications are noted. Mediastinum/Nodes: No enlarged mediastinal, hilar, or axillary lymph nodes. Thyroid gland, trachea, and esophagus demonstrate no significant findings. Lungs/Pleura: No pneumothorax or pleural effusion is noted. 6 mm nodule seen in left lower lobe best seen on image number 54 of series 6. Right lung is clear. Upper Abdomen: Visualized portion of splenic flexure appears to be dilated. Musculoskeletal: No chest wall abnormality. No acute or significant osseous findings.  Review of the MIP images confirms the above findings. IMPRESSION: No definite evidence of pulmonary embolus. Coronary artery calcifications are noted suggesting coronary artery disease. 6 mm nodule seen in left lower lobe. Non-contrast chest CT at 6-12 months is recommended. If the nodule is stable at time of repeat CT, then future CT at 18-24 months (from today's scan) is considered optional for low-risk patients, but is recommended for high-risk patients. This recommendation follows the consensus statement: Guidelines for Management of Incidental Pulmonary Nodules Detected on CT Images: From the Fleischner Society 2017; Radiology 2017; 284:228-243. Splenic flexure of colon appears to be dilated in the visualized portion of upper abdomen. Colonic inflammation or obstruction cannot be excluded. Aortic Atherosclerosis (ICD10-I70.0). Electronically Signed   By: JMarijo Conception M.D.   On: 01/09/2018 10:56   Ct Abdomen Pelvis W Contrast  Result Date: 01/09/2018 CLINICAL DATA:  Acute generalized abdominal pain. EXAM: CT ABDOMEN AND PELVIS WITH CONTRAST TECHNIQUE: Multidetector CT imaging of the abdomen and pelvis was performed using the standard protocol following bolus administration of intravenous contrast. CONTRAST:  714mISOVUE-370 IOPAMIDOL (ISOVUE-370) INJECTION 76% COMPARISON:  CT scan of March 24, 2015. FINDINGS: Lower chest: No acute abnormality. Hepatobiliary: No focal liver abnormality is seen. Status post cholecystectomy. No biliary dilatation. Pancreas: Unremarkable. No pancreatic ductal dilatation or surrounding inflammatory changes. Spleen: Normal in size without focal abnormality. Adrenals/Urinary Tract: Adrenal glands appear normal. Small bilateral renal cysts are noted. No hydronephrosis or renal obstruction is noted. No renal or ureteral calculi are noted. Urinary bladder is unremarkable. Stomach/Bowel: Sigmoid diverticulosis is noted. No small bowel dilatation is noted. Stomach is unremarkable.  The appendix is not clearly visualized. Focal wall thickening is seen involving proximal sigmoid colon and distal descending colon concerning for focal colitis. This most likely is inflammatory or infectious in etiology. There is significant dilatation of the more proximal large bowel suggesting some degree of obstruction. Vascular/Lymphatic: Aortic atherosclerosis. No enlarged abdominal or pelvic lymph nodes. Reproductive: Uterus and bilateral adnexa are unremarkable. Other: No abdominal wall hernia or abnormality. No abdominopelvic ascites. Musculoskeletal: Status post bilateral hip arthroplasties. Postsurgical and degenerative changes are noted in  the lumbar spine. No acute osseous abnormality is noted. IMPRESSION: Wall thickening is seen involving distal descending colon and proximal sigmoid colon concerning for infectious or inflammatory colitis. There does appear to be dilatation of the more proximal large bowel suggesting some degree of obstruction as a result. Sigmoid diverticulosis is noted, but no definite evidence of inflamed diverticulum are noted. Aortic Atherosclerosis (ICD10-I70.0). Electronically Signed   By: Marijo Conception, M.D.   On: 01/09/2018 11:09   US Venous Img Lower Unilateral Right  Result Date: 01/09/2018 CLINICAL DATA:  Pain x5 days EXAM: RIGHT LOWER EXTREMITY VENOUS DOPPLER ULTRASOUND TECHNIQUE: Gray-scale sonography with compression, as well as color and duplex ultrasound, were performed to evaluate the deep venous system from the level of the common femoral vein through the popliteal and proximal calf veins. COMPARISON:  02/06/2011 FINDINGS: Normal compressibility of the common femoral, superficial femoral, and popliteal veins, as well as the proximal calf veins. No filling defects to suggest DVT on grayscale or color Doppler imaging. Doppler waveforms show normal direction of venous flow, normal respiratory phasicity and response to augmentation. Survey views of the contralateral  common femoral vein are unremarkable. IMPRESSION: No evidence of right lower extremity deep vein thrombosis. Electronically Signed   By: Lucrezia Europe M.D.   On: 01/09/2018 10:13   Dg Knee Complete 4 Views Right  Result Date: 01/09/2018 CLINICAL DATA:  Right knee replacement 1 week ago with multiple complaints since then. EXAM: RIGHT KNEE - COMPLETE 4+ VIEW COMPARISON:  MRI right knee 03/16/2017 FINDINGS: Postoperative right knee replacement. Patella femoral component is present. Components appear well seated although oblique positioning on the lateral view limits evaluation. Skin clips and soft tissue gas are consistent with recent surgery. No evidence of acute fracture or dislocation. IMPRESSION: Right total knee arthroplasty. Components appear well seated and no acute complication is suggested. Electronically Signed   By: Lucienne Capers M.D.   On: 01/09/2018 05:27   Dg Abdomen Acute W/chest  Result Date: 01/09/2018 CLINICAL DATA:  Fever, acute generalized abdominal pain. EXAM: DG ABDOMEN ACUTE W/ 1V CHEST COMPARISON:  Radiographs of same day. FINDINGS: There is no evidence of dilated bowel loops or free intraperitoneal air. Status post cholecystectomy. Phleboliths are noted in the pelvis. Heart size and mediastinal contours are within normal limits. Both lungs are clear. IMPRESSION: No evidence of bowel obstruction or ileus. No acute cardiopulmonary disease. Electronically Signed   By: Marijo Conception, M.D.   On: 01/09/2018 09:31       Assessment & Plan:   Problem List Items Addressed This Visit    Anemia    Unable to tolerate iron.  Discussed with her today.  Wants to remain off.  Follow.        Anxiety    Doing better.  Does not feel needs any further intervention.  Follow.        CKD (chronic kidney disease) stage 3, GFR 30-59 ml/min (HCC)    Follow metabolic panel.        Essential hypertension    Blood pressure has been doing relatively well.  Follow.        Hyperglycemia     Low carb diet and exercise.  Follow met b and a1c.       Lung nodule    Found on CT in hospital as outlined.  Recommended f/u in 6-12 months.        Mixed hyperlipidemia    On crestor.  Low cholesterol diet and exercise.  Follow lipid panel and liver function tests.        PAD (peripheral artery disease) ([place])    Continue crestor and aspirin.            Einar Pheasant, MD

## 2018-03-16 NOTE — Progress Notes (Addendum)
Subjective:   Alicia Mccann is a 84 y.o. female who presents for Medicare Annual (Subsequent) preventive examination.  Review of Systems:  No ROS.  Medicare Wellness Visit. Additional risk factors are reflected in the social history.  Cardiac Risk Factors include: advanced age (>53men, >37 women);hypertension     Objective:     Vitals: BP 138/68 (BP Location: Left Arm, Patient Position: Sitting, Cuff Size: Normal)   Pulse 68   Temp 97.7 F (36.5 C) (Oral)   Resp 18   Ht 5\' 1"  (1.549 m)   Wt 157 lb 9.6 oz (71.5 kg)   SpO2 97%   BMI 29.78 kg/m   Body mass index is 29.78 kg/m.  Advanced Directives 03/16/2018 01/26/2018 01/09/2018 10/26/2017 01/06/2017 05/19/2016 05/16/2016  Does Patient Have a Medical Advance Directive? Yes Yes No Yes Yes Yes No;Yes  Type of Estate agent of [place];Living will Healthcare Power of [place];Living will - Healthcare Power of [place];Living will Living will;Healthcare Power of Attorney - Living will;Healthcare Power of Attorney  Does patient want to make changes to medical advance directive? No - Patient declined - - No - Patient declined No - Patient declined - No - Patient declined  Copy of Healthcare Power of Attorney in Chart? No - copy requested - - - - - -  Would patient like information on creating a medical advance directive? - No - Patient declined No - Patient declined - - - -    Tobacco Social History   Tobacco Use  Smoking Status Never Smoker  Smokeless Tobacco Never Used     Counseling given: Not Answered   Clinical Intake:  Pre-visit preparation completed: Yes  Pain : No/denies pain     Nutritional Status: BMI 25 -29 Overweight Diabetes: No  How often do you need to have someone help you when you read instructions, pamphlets, or other written materials from your doctor or pharmacy?: 1 - Never  Interpreter Needed?: No     Past Medical History:  Diagnosis Date  . Allergic rhinitis   . Anemia    . Anxiety   . Cellulitis   . Cholelithiasis   . Essential hypertension   . History of stress test    a. 12/2008 Ex MV: EF 78%, no ischemia.  . Insomnia   . Osteoarthritis   . Tachycardia   . Valvular heart disease    a. 12/2008 Echo: EF 65%, mild MR;  b. 2/6 SEM RUSB - insignificant murmur, prob Ao Sclerosis.   Past Surgical History:  Procedure Laterality Date  . BACK SURGERY    . BUNIONECTOMY    . CHOLECYSTECTOMY  03/29/2007  . GALLBLADDER SURGERY    . HIP SURGERY     bilateral   . REPLACEMENT TOTAL KNEE Right   . THUMB ARTHROSCOPY     Family History  Problem Relation Age of Onset  . Heart attack Father   . Stroke Father   . Heart disease Father   . CAD Father   . Heart attack Son 64       MI  . Hypertension Son   . Hypertension Mother   . Heart disease Mother   . Stroke Mother   . Breast cancer Neg Hx    Social History   Socioeconomic History  . Marital status: Widowed    Spouse name: Not on file  . Number of children: Not on file  . Years of education: Not on file  . Highest education level: Not on  file  Occupational History  . Not on file  Social Needs  . Financial resource strain: Not hard at all  . Food insecurity:    Worry: Never true    Inability: Never true  . Transportation needs:    Medical: No    Non-medical: No  Tobacco Use  . Smoking status: Never Smoker  . Smokeless tobacco: Never Used  Substance and Sexual Activity  . Alcohol use: No  . Drug use: No  . Sexual activity: Not Currently  Lifestyle  . Physical activity:    Days per week: 4 days    Minutes per session: 20 min  . Stress: Not at all  Relationships  . Social connections:    Talks on phone: Not on file    Gets together: Not on file    Attends religious service: Not on file    Active member of club or organization: Not on file    Attends meetings of clubs or organizations: Not on file    Relationship status: Not on file  Other Topics Concern  . Not on file  Social  History Narrative  . Not on file    Outpatient Encounter Medications as of 03/16/2018  Medication Sig  . acetaminophen (TYLENOL) 500 MG tablet Take 1,000 mg by mouth every 8 (eight) hours as needed for mild pain or moderate pain.  Marland Kitchen. ALPRAZolam (XANAX) 0.25 MG tablet TAKE 1 TABLET BY MOUTH AT BEDTIME AS NEEDED  . amLODipine (NORVASC) 2.5 MG tablet TAKE 1 TABLET BY MOUTH DAILY  . cholecalciferol (VITAMIN D) 1000 UNITS tablet Take 1,000 Units by mouth every other day.   . cyanocobalamin ([employer] VITAMIN B12) 2000 MCG tablet Take by mouth.  . Fe Fum-FePoly-Vit C-Vit B3 (INTEGRA) 62.5-62.5-40-3 MG CAPS Take 1 tablet by mouth daily.  . mupirocin ointment (BACTROBAN) 2 % Apply to affected area bid  . rosuvastatin (CRESTOR) 10 MG tablet TAKE 1 TABLET BY MOUTH DAILY  . traMADol (ULTRAM) 50 MG tablet   . triamterene-hydrochlorothiazide (MAXZIDE-25) 37.5-25 MG tablet TAKE 1 TABLET DAILY   No facility-administered encounter medications on file as of 03/16/2018.     Activities of Daily Living In your present state of health, do you have any difficulty performing the following activities: 03/16/2018 01/09/2018  Hearing? N N  Vision? N N  Difficulty concentrating or making decisions? N N  Walking or climbing stairs? N Y  Dressing or bathing? N Y  Doing errands, shopping? N N  Preparing Food and eating ? N -  Using the Toilet? N -  In the past six months, have you accidently leaked urine? N -  Do you have problems with loss of bowel control? N -  Managing your Medications? N -  Managing your Finances? N -  Housekeeping or managing your Housekeeping? N -  Some recent data might be hidden    Patient Care Team: Dale DurhamScott, Charlene, MD as PCP - General (Internal Medicine)    Assessment:   This is a routine wellness examination for Alicia Mccann.  The goal of the wellness visit is to assist the patient how to close the gaps in care and create a preventative care plan for the patient.   The roster of all  physicians providing medical care to patient is listed in the Snapshot section of the chart.  Taking calcium VIT D as appropriate/Osteoporosis risk reviewed.    Safety issues reviewed; Lives alone. Alarm with smoke and carbon monoxide detectors in the home. No firearms in the home.  Wears seatbelts when driving or riding with others. No violence in the home.  They do not have excessive sun exposure.  Discussed the need for sun protection: hats, long sleeves and the use of sunscreen if there is significant sun exposure.  Patient is alert, normal appearance, oriented to person/place/and time. Correctly identified the president of the [place] and recalls of 3/3 words.Performs simple calculations and can read correct time from watch face. Displays appropriate judgement.  No new identified risk were noted.  No failures at ADL's or IADL's.   BMI- discussed the importance of a healthy diet, water intake and the benefits of aerobic exercise. She does not dine out often and tries to make healthy choices at home. Walks her dog for exercise and plans to increase her walking to 150 minutes per week.   24 hour diet recall: Regular diet  Dental- every 6 months.  Sleep patterns- Sleeps 6-8 hours at night.  Tylenol in use as needed for pain.   TDAP vaccine discussed.   Patient Concerns: None at this time. Follow up with PCP as needed.  Exercise Activities and Dietary recommendations Current Exercise Habits: Structured exercise class, Type of exercise: walking;stretching(Physical therapy home exercises), Time (Minutes): 20, Frequency (Times/Week): 4, Weekly Exercise (Minutes/Week): 80, Intensity: Mild  Goals    . Exercise 150 minutes per week (moderate activity)       Fall Risk Fall Risk  03/16/2018 05/08/2017 06/07/2015  Falls in the past year? No No No   Depression Screen PHQ 2/9 Scores 03/16/2018 05/08/2017 11/11/2016 06/07/2015  PHQ - 2 Score 0 0 0 0  PHQ- 9 Score - 0 3 -     Cognitive  Function MMSE - Mini Mental State Exam 03/16/2018  Orientation to time 5  Orientation to Place 5  Registration 3  Attention/ Calculation 5  Recall 3  Language- name 2 objects 2  Language- repeat 1  Language- follow 3 step command 3  Language- read & follow direction 1  Write a sentence 1  Copy design 1  Total score 30        Immunization History  Administered Date(s) Administered  . Influenza Split 05/16/2010, 07/22/2012  . Influenza, High Dose Seasonal PF 06/09/2014, 06/07/2015, 05/08/2017  . Influenza,inj,Quad PF,6+ Mos 05/18/2013  . Influenza-Unspecified 06/11/2016  . Pneumococcal Conjugate-13 06/09/2014  . Pneumococcal Polysaccharide-23 06/18/2004  . Td 05/24/2007  . Zoster 01/28/2008    Screening Tests Health Maintenance  Topic Date Due  . TETANUS/TDAP  05/23/2017  . INFLUENZA VACCINE  03/18/2018  . DEXA SCAN  Completed  . PNA vac Low Risk Adult  Completed      Plan:    End of life planning; Advance aging; Advanced directives discussed. Copy of current HCPOA/Living Will requested.    I have personally reviewed and noted the following in the patient's chart:   . Medical and social history . Use of alcohol, tobacco or illicit drugs  . Current medications and supplements . Functional ability and status . Nutritional status . Physical activity . Advanced directives . List of other physicians . Hospitalizations, surgeries, and ER visits in previous 12 months . Vitals . Screenings to include cognitive, depression, and falls . Referrals and appointments  In addition, I have reviewed and discussed with patient certain preventive protocols, quality metrics, and best practice recommendations. A written personalized care plan for preventive services as well as general preventive health recommendations were provided to patient.     Ashok Pall, LPN  1/[id number]/[id number]  Reviewed above information.  Agree with assessment and plan.    Dr Nicki Reaper

## 2018-03-16 NOTE — Patient Instructions (Addendum)
  [name] , Thank you for taking time to come for your Medicare Wellness Visit. I appreciate your ongoing commitment to your health goals. Please review the following plan we discussed and let me know if I can assist you in the future.   These are the goals we discussed: Goals    . Exercise 150 minutes per week (moderate activity)       This is a list of the screening recommended for you and due dates:  Health Maintenance  Topic Date Due  . Tetanus Vaccine  05/23/2017  . Flu Shot  03/18/2018  . DEXA scan (bone density measurement)  Completed  . Pneumonia vaccines  Completed

## 2018-03-17 ENCOUNTER — Encounter: Payer: Medicare Other | Admitting: Physical Therapy

## 2018-03-18 ENCOUNTER — Other Ambulatory Visit: Payer: Self-pay | Admitting: Internal Medicine

## 2018-03-20 ENCOUNTER — Encounter: Payer: Self-pay | Admitting: Internal Medicine

## 2018-03-20 NOTE — Assessment & Plan Note (Signed)
Blood pressure has been doing relatively well.  Follow.

## 2018-03-20 NOTE — Assessment & Plan Note (Signed)
Unable to tolerate iron.  Discussed with her today.  Wants to remain off.  Follow.

## 2018-03-20 NOTE — Assessment & Plan Note (Signed)
Follow metabolic panel.  

## 2018-03-20 NOTE — Assessment & Plan Note (Signed)
On crestor.  Low cholesterol diet and exercise.  Follow lipid panel and liver function tests.   

## 2018-03-20 NOTE — Assessment & Plan Note (Signed)
Found on CT in hospital as outlined.  Recommended f/u in 6-12 months.

## 2018-03-20 NOTE — Assessment & Plan Note (Signed)
Low carb diet and exercise.  Follow met b and a1c.  

## 2018-03-20 NOTE — Assessment & Plan Note (Signed)
Doing better.  Does not feel needs any further intervention.  Follow.

## 2018-03-20 NOTE — Assessment & Plan Note (Signed)
Continue crestor and aspirin.  

## 2018-03-22 ENCOUNTER — Encounter: Payer: Medicare Other | Admitting: Physical Therapy

## 2018-03-24 ENCOUNTER — Encounter: Payer: Medicare Other | Admitting: Physical Therapy

## 2018-03-29 ENCOUNTER — Encounter: Payer: Medicare Other | Admitting: Physical Therapy

## 2018-03-31 ENCOUNTER — Encounter: Payer: Medicare Other | Admitting: Physical Therapy

## 2018-04-05 ENCOUNTER — Encounter: Payer: Medicare Other | Admitting: Physical Therapy

## 2018-04-07 ENCOUNTER — Encounter: Payer: Medicare Other | Admitting: Physical Therapy

## 2018-04-12 ENCOUNTER — Encounter: Payer: Medicare Other | Admitting: Physical Therapy

## 2018-04-14 ENCOUNTER — Encounter: Payer: Medicare Other | Admitting: Physical Therapy

## 2018-04-27 ENCOUNTER — Encounter: Payer: Self-pay | Admitting: Internal Medicine

## 2018-04-27 ENCOUNTER — Ambulatory Visit: Payer: Medicare Other | Admitting: Internal Medicine

## 2018-04-27 DIAGNOSIS — R739 Hyperglycemia, unspecified: Secondary | ICD-10-CM

## 2018-04-27 DIAGNOSIS — N183 Chronic kidney disease, stage 3 unspecified: Secondary | ICD-10-CM

## 2018-04-27 DIAGNOSIS — Z1231 Encounter for screening mammogram for malignant neoplasm of breast: Secondary | ICD-10-CM

## 2018-04-27 DIAGNOSIS — R911 Solitary pulmonary nodule: Secondary | ICD-10-CM

## 2018-04-27 DIAGNOSIS — I10 Essential (primary) hypertension: Secondary | ICD-10-CM

## 2018-04-27 DIAGNOSIS — F419 Anxiety disorder, unspecified: Secondary | ICD-10-CM | POA: Diagnosis not present

## 2018-04-27 DIAGNOSIS — D649 Anemia, unspecified: Secondary | ICD-10-CM

## 2018-04-27 DIAGNOSIS — E782 Mixed hyperlipidemia: Secondary | ICD-10-CM

## 2018-04-27 DIAGNOSIS — Z1239 Encounter for other screening for malignant neoplasm of breast: Secondary | ICD-10-CM

## 2018-04-27 DIAGNOSIS — H9190 Unspecified hearing loss, unspecified ear: Secondary | ICD-10-CM

## 2018-04-27 NOTE — Progress Notes (Signed)
Patient ID: Alicia Mccann, female   DOB: 1933/05/25, 84 y.o.   MRN: [id number]   Subjective:    Patient ID: Alicia Mccann, female    DOB: 09-16-1932, 84 y.o.   MRN: [id number]  HPI  Patient here for a scheduled follow up. She reports she is doing relatively well. Overall doing better, but still reports some fatigue.  She stopped crestor with concerns this was contributing to her fatigue.  Only stopped one day ago.  No chest pain.  No sob.  No acid reflux.  No abdominal pain.  Bowels moving.  Knee is doing better.  She does report decreased hearing.  Request referral.  Increased stress, but she feels she is handling things relatively well.  Does not feel needs any further intervention.     Past Medical History:  Diagnosis Date  . Allergic rhinitis   . Anemia   . Anxiety   . Cellulitis   . Cholelithiasis   . Essential hypertension   . History of stress test    a. 12/2008 Ex MV: EF 78%, no ischemia.  . Insomnia   . Osteoarthritis   . Tachycardia   . Valvular heart disease    a. 12/2008 Echo: EF 65%, mild MR;  b. 2/6 SEM RUSB - insignificant murmur, prob Ao Sclerosis.   Past Surgical History:  Procedure Laterality Date  . BACK SURGERY    . BUNIONECTOMY    . CHOLECYSTECTOMY  03/29/2007  . GALLBLADDER SURGERY    . HIP SURGERY     bilateral   . REPLACEMENT TOTAL KNEE Right   . THUMB ARTHROSCOPY     Family History  Problem Relation Age of Onset  . Heart attack Father   . Stroke Father   . Heart disease Father   . CAD Father   . Heart attack Son 11       MI  . Hypertension Son   . Hypertension Mother   . Heart disease Mother   . Stroke Mother   . Breast cancer Neg Hx    Social History   Socioeconomic History  . Marital status: Widowed    Spouse name: Not on file  . Number of children: Not on file  . Years of education: Not on file  . Highest education level: Not on file  Occupational History  . Not on file  Social Needs  . Financial resource strain: Not hard  at all  . Food insecurity:    Worry: Never true    Inability: Never true  . Transportation needs:    Medical: No    Non-medical: No  Tobacco Use  . Smoking status: Never Smoker  . Smokeless tobacco: Never Used  Substance and Sexual Activity  . Alcohol use: No  . Drug use: No  . Sexual activity: Not Currently  Lifestyle  . Physical activity:    Days per week: 4 days    Minutes per session: 20 min  . Stress: Not at all  Relationships  . Social connections:    Talks on phone: Not on file    Gets together: Not on file    Attends religious service: Not on file    Active member of club or organization: Not on file    Attends meetings of clubs or organizations: Not on file    Relationship status: Not on file  Other Topics Concern  . Not on file  Social History Narrative  . Not on file    Outpatient Encounter Medications  as of 04/27/2018  Medication Sig  . acetaminophen (TYLENOL) 500 MG tablet Take 1,000 mg by mouth every 8 (eight) hours as needed for mild pain or moderate pain.  Marland Kitchen ALPRAZolam (XANAX) 0.25 MG tablet TAKE 1 TABLET BY MOUTH AT BEDTIME AS NEEDED  . amLODipine (NORVASC) 2.5 MG tablet TAKE 1 TABLET BY MOUTH DAILY  . cholecalciferol (VITAMIN D) 1000 UNITS tablet Take 1,000 Units by mouth every other day.   . cyanocobalamin ([employer] VITAMIN B12) 2000 MCG tablet Take by mouth.  . triamterene-hydrochlorothiazide (MAXZIDE-25) 37.5-25 MG tablet TAKE 1 TABLET DAILY  . [DISCONTINUED] Fe Fum-FePoly-Vit C-Vit B3 (INTEGRA) 62.5-62.5-40-3 MG CAPS TAKE 1 CAPSULE BY MOUTH DAILY  . [DISCONTINUED] mupirocin ointment (BACTROBAN) 2 % Apply to affected area bid  . [DISCONTINUED] rosuvastatin (CRESTOR) 10 MG tablet TAKE 1 TABLET BY MOUTH DAILY  . [DISCONTINUED] traMADol (ULTRAM) 50 MG tablet    No facility-administered encounter medications on file as of 04/27/2018.     Review of Systems  Constitutional: Positive for fatigue. Negative for appetite change and unexpected weight change.    HENT: Negative for congestion and sinus pressure.   Respiratory: Negative for cough, chest tightness and shortness of breath.   Cardiovascular: Negative for chest pain, palpitations and leg swelling.  Gastrointestinal: Negative for abdominal pain, diarrhea, nausea and vomiting.  Genitourinary: Negative for difficulty urinating and dysuria.  Musculoskeletal: Negative for joint swelling and myalgias.  Skin: Negative for color change and rash.  Neurological: Negative for dizziness, light-headedness and headaches.  Psychiatric/Behavioral: Negative for agitation and dysphoric mood.       Objective:    Physical Exam  Constitutional: She appears well-developed and well-nourished. No distress.  HENT:  Nose: Nose normal.  Mouth/Throat: Oropharynx is clear and moist.  Neck: Neck supple. No thyromegaly present.  Cardiovascular: Normal rate and regular rhythm.  Pulmonary/Chest: Breath sounds normal. No respiratory distress. She has no wheezes.  Abdominal: Soft. Bowel sounds are normal. There is no tenderness.  Musculoskeletal: She exhibits no edema or tenderness.  Lymphadenopathy:    She has no cervical adenopathy.  Skin: No rash noted. No erythema.  Psychiatric: She has a normal mood and affect. Her behavior is normal.    BP 140/70 (BP Location: Left Arm, Patient Position: Sitting, Cuff Size: Normal)   Pulse 82   Temp 98 F (36.7 C) (Oral)   Resp 18   Wt 161 lb 3.2 oz (73.1 kg)   SpO2 98%   BMI 30.46 kg/m  Wt Readings from Last 3 Encounters:  04/27/18 161 lb 3.2 oz (73.1 kg)  03/16/18 157 lb 9.6 oz (71.5 kg)  03/16/18 157 lb 9.6 oz (71.5 kg)     Lab Results  Component Value Date   WBC 11.4 (H) 02/17/2018   HGB 11.6 (L) 02/17/2018   HCT 35.7 (L) 02/17/2018   PLT 401.0 (H) 02/17/2018   GLUCOSE 95 03/11/2018   CHOL 123 02/17/2018   TRIG 171.0 (H) 02/17/2018   HDL 48.40 02/17/2018   LDLCALC 40 02/17/2018   ALT 11 02/17/2018   AST 14 02/17/2018   NA 140 03/11/2018   K  4.1 03/11/2018   CL 105 03/11/2018   CREATININE 1.10 03/11/2018   BUN 27 (H) 03/11/2018   CO2 27 03/11/2018   TSH 1.76 10/19/2017   INR 0.9 01/16/2009    Dg Chest 2 View  Result Date: 01/09/2018 CLINICAL DATA:  Right knee replacement 1 week ago. Since the surgery, patient states she can't walk, food does not taste the  stain, bowel movements are black, and vomiting. Shortness of breath. EXAM: CHEST - 2 VIEW COMPARISON:  02/06/2011 FINDINGS: Heart size and pulmonary vascularity are normal. Slight linear fibrosis or atelectasis in the lung bases. No consolidation or airspace disease. No blunting of costophrenic angles. No pneumothorax. Mediastinal contours appear intact. Calcification of the aorta. Degenerative changes in the spine. IMPRESSION: Linear atelectasis or fibrosis in the lung bases. No active consolidation. Electronically Signed   By: Lucienne Capers M.D.   On: 01/09/2018 05:25   Ct Angio Chest Pe W And/or Wo Contrast  Result Date: 01/09/2018 CLINICAL DATA:  Abdominal cramping, constipation. Positive D-dimer level. EXAM: CT ANGIOGRAPHY CHEST WITH CONTRAST TECHNIQUE: Multidetector CT imaging of the chest was performed using the standard protocol during bolus administration of intravenous contrast. Multiplanar CT image reconstructions and MIPs were obtained to evaluate the vascular anatomy. CONTRAST:  69m ISOVUE-370 IOPAMIDOL (ISOVUE-370) INJECTION 76% COMPARISON:  Radiographs of same day. FINDINGS: Cardiovascular: Satisfactory opacification of the pulmonary arteries to the segmental level. No evidence of pulmonary embolism. Normal heart size. No pericardial effusion. Atherosclerosis of thoracic aorta is noted without aneurysm or dissection. Coronary artery calcifications are noted. Mediastinum/Nodes: No enlarged mediastinal, hilar, or axillary lymph nodes. Thyroid gland, trachea, and esophagus demonstrate no significant findings. Lungs/Pleura: No pneumothorax or pleural effusion is noted. 6  mm nodule seen in left lower lobe best seen on image number 54 of series 6. Right lung is clear. Upper Abdomen: Visualized portion of splenic flexure appears to be dilated. Musculoskeletal: No chest wall abnormality. No acute or significant osseous findings. Review of the MIP images confirms the above findings. IMPRESSION: No definite evidence of pulmonary embolus. Coronary artery calcifications are noted suggesting coronary artery disease. 6 mm nodule seen in left lower lobe. Non-contrast chest CT at 6-12 months is recommended. If the nodule is stable at time of repeat CT, then future CT at 18-24 months (from today's scan) is considered optional for low-risk patients, but is recommended for high-risk patients. This recommendation follows the consensus statement: Guidelines for Management of Incidental Pulmonary Nodules Detected on CT Images: From the Fleischner Society 2017; Radiology 2017; 284:228-243. Splenic flexure of colon appears to be dilated in the visualized portion of upper abdomen. Colonic inflammation or obstruction cannot be excluded. Aortic Atherosclerosis (ICD10-I70.0). Electronically Signed   By: JMarijo Conception M.D.   On: 01/09/2018 10:56   Ct Abdomen Pelvis W Contrast  Result Date: 01/09/2018 CLINICAL DATA:  Acute generalized abdominal pain. EXAM: CT ABDOMEN AND PELVIS WITH CONTRAST TECHNIQUE: Multidetector CT imaging of the abdomen and pelvis was performed using the standard protocol following bolus administration of intravenous contrast. CONTRAST:  720mISOVUE-370 IOPAMIDOL (ISOVUE-370) INJECTION 76% COMPARISON:  CT scan of March 24, 2015. FINDINGS: Lower chest: No acute abnormality. Hepatobiliary: No focal liver abnormality is seen. Status post cholecystectomy. No biliary dilatation. Pancreas: Unremarkable. No pancreatic ductal dilatation or surrounding inflammatory changes. Spleen: Normal in size without focal abnormality. Adrenals/Urinary Tract: Adrenal glands appear normal. Small  bilateral renal cysts are noted. No hydronephrosis or renal obstruction is noted. No renal or ureteral calculi are noted. Urinary bladder is unremarkable. Stomach/Bowel: Sigmoid diverticulosis is noted. No small bowel dilatation is noted. Stomach is unremarkable. The appendix is not clearly visualized. Focal wall thickening is seen involving proximal sigmoid colon and distal descending colon concerning for focal colitis. This most likely is inflammatory or infectious in etiology. There is significant dilatation of the more proximal large bowel suggesting some degree of obstruction. Vascular/Lymphatic: Aortic atherosclerosis. No  enlarged abdominal or pelvic lymph nodes. Reproductive: Uterus and bilateral adnexa are unremarkable. Other: No abdominal wall hernia or abnormality. No abdominopelvic ascites. Musculoskeletal: Status post bilateral hip arthroplasties. Postsurgical and degenerative changes are noted in the lumbar spine. No acute osseous abnormality is noted. IMPRESSION: Wall thickening is seen involving distal descending colon and proximal sigmoid colon concerning for infectious or inflammatory colitis. There does appear to be dilatation of the more proximal large bowel suggesting some degree of obstruction as a result. Sigmoid diverticulosis is noted, but no definite evidence of inflamed diverticulum are noted. Aortic Atherosclerosis (ICD10-I70.0). Electronically Signed   By: Marijo Conception, M.D.   On: 01/09/2018 11:09   US Venous Img Lower Unilateral Right  Result Date: 01/09/2018 CLINICAL DATA:  Pain x5 days EXAM: RIGHT LOWER EXTREMITY VENOUS DOPPLER ULTRASOUND TECHNIQUE: Gray-scale sonography with compression, as well as color and duplex ultrasound, were performed to evaluate the deep venous system from the level of the common femoral vein through the popliteal and proximal calf veins. COMPARISON:  02/06/2011 FINDINGS: Normal compressibility of the common femoral, superficial femoral, and popliteal  veins, as well as the proximal calf veins. No filling defects to suggest DVT on grayscale or color Doppler imaging. Doppler waveforms show normal direction of venous flow, normal respiratory phasicity and response to augmentation. Survey views of the contralateral common femoral vein are unremarkable. IMPRESSION: No evidence of right lower extremity deep vein thrombosis. Electronically Signed   By: Lucrezia Europe M.D.   On: 01/09/2018 10:13   Dg Knee Complete 4 Views Right  Result Date: 01/09/2018 CLINICAL DATA:  Right knee replacement 1 week ago with multiple complaints since then. EXAM: RIGHT KNEE - COMPLETE 4+ VIEW COMPARISON:  MRI right knee 03/16/2017 FINDINGS: Postoperative right knee replacement. Patella femoral component is present. Components appear well seated although oblique positioning on the lateral view limits evaluation. Skin clips and soft tissue gas are consistent with recent surgery. No evidence of acute fracture or dislocation. IMPRESSION: Right total knee arthroplasty. Components appear well seated and no acute complication is suggested. Electronically Signed   By: Lucienne Capers M.D.   On: 01/09/2018 05:27   Dg Abdomen Acute W/chest  Result Date: 01/09/2018 CLINICAL DATA:  Fever, acute generalized abdominal pain. EXAM: DG ABDOMEN ACUTE W/ 1V CHEST COMPARISON:  Radiographs of same day. FINDINGS: There is no evidence of dilated bowel loops or free intraperitoneal air. Status post cholecystectomy. Phleboliths are noted in the pelvis. Heart size and mediastinal contours are within normal limits. Both lungs are clear. IMPRESSION: No evidence of bowel obstruction or ileus. No acute cardiopulmonary disease. Electronically Signed   By: Marijo Conception, M.D.   On: 01/09/2018 09:31       Assessment & Plan:   Problem List Items Addressed This Visit    Anemia    Unable to tolerate iron.  Off.  Follow cbc and ferritin.        Relevant Orders   Ferritin   CBC with Differential/Platelet    IBC panel   Anxiety    Overall doing better.  Has xanax if needed.  Follow.        CKD (chronic kidney disease) stage 3, GFR 30-59 ml/min (HCC)    Follow metabolic panel.       Decreased hearing    Refer to ENT for evaluation.        Relevant Orders   Ambulatory referral to ENT   Essential hypertension    Blood pressure has been doing  relatively well.  Continue current medication regimen.  Follow pressures.  Follow metabolic panel.        Relevant Orders   Basic metabolic panel   Hyperglycemia    Low carb diet and exercise.  Follow met b and a1c.        Relevant Orders   Hemoglobin A1c   Lung nodule    Found on CT in hospital.  Recommended f/u in 6-12 months.        Mixed hyperlipidemia    Off crestor.  Stopped yesterday.  Felt contributing to fatigue.  Low cholesterol diet and exercise.  Follow lipid panel.  Call with update over the next 2-3 weeks.        Relevant Orders   Hepatic function panel   Lipid panel    Other Visit Diagnoses    Breast cancer screening       Relevant Orders   MM 3D SCREEN BREAST BILATERAL       Einar Pheasant, MD

## 2018-05-02 ENCOUNTER — Encounter: Payer: Self-pay | Admitting: Internal Medicine

## 2018-05-02 DIAGNOSIS — H9190 Unspecified hearing loss, unspecified ear: Secondary | ICD-10-CM | POA: Insufficient documentation

## 2018-05-02 NOTE — Assessment & Plan Note (Signed)
Overall doing better.  Has xanax if needed.  Follow.

## 2018-05-02 NOTE — Assessment & Plan Note (Signed)
Off crestor.  Stopped yesterday.  Felt contributing to fatigue.  Low cholesterol diet and exercise.  Follow lipid panel.  Call with update over the next 2-3 weeks.

## 2018-05-02 NOTE — Assessment & Plan Note (Signed)
Refer to ENT for evaluation 

## 2018-05-02 NOTE — Assessment & Plan Note (Signed)
Unable to tolerate iron.  Off.  Follow cbc and ferritin.

## 2018-05-02 NOTE — Assessment & Plan Note (Signed)
Low carb diet and exercise.  Follow met b and a1c.   

## 2018-05-02 NOTE — Assessment & Plan Note (Signed)
Blood pressure has been doing relatively well.  Continue current medication regimen.  Follow pressures.  Follow metabolic panel.  

## 2018-05-02 NOTE — Assessment & Plan Note (Signed)
Found on CT in hospital.  Recommended f/u in 6-12 months.

## 2018-05-02 NOTE — Assessment & Plan Note (Signed)
Follow metabolic panel.  

## 2018-05-08 NOTE — Progress Notes (Signed)
Cardiology Office Note  Date:  05/10/2018   ID:  Alicia Mccann, DOB 1932/09/16, MRN [id number]  PCP:  Dale [place], MD   Chief Complaint  Patient presents with  . other    12 month follow up. Meds reviewed by the pt. verbally. "doing well."     HPI:  Alicia Mccann is a very pleasant 84 year old woman history of hypertension,  obesity,  osteoarthritis,  previously evaluated for murmur , aortic valve sclerosis Without significant stenosis  Hypertension 12/2008 Ex MV: EF 78%, no ischemia. She presents for routine follow-up of her hypertension and murmur   lost her son  likely secondary to acute MI.  Husband with severe heart disease, LVAD, died  lives alone  TKR in early 2019 Then diverticulitis requiring hospitalization Difficult time recovering,  better now  CT chest May 2019 coronary calcium , moderate in nature LAD Aortic plaque, mild to moderate/arch  No regular exercise program but stays busy all of the time She denies having any shortness of breath or chest discomfort.  Total chol 182, LDL 102 Reports having problem on Crestor in the past, off the medication  EKG personally reviewed by myself on todays visit Shows normal sinus rhythm rate 80 bpm no significant ST or T wave changes  Other past medical history reviewed Echocardiogram May 2010 showed normal LV systolic function, mild MR and TR  Echocardiogram stress test May 2010 was essentially normal. She exercised for 3 minutes achieved 4.6 METs, peak heart rate 151 beats per minute. No EKG changes concerning for ischemia. Echocardiogram showed no ischemia  Followup note from cardiology April 2011 suggested she have fatigue from low-dose beta blocker. She was on lisinopril 20 mg at that time.  She does report having a family history. Mother was a smoker, had a stroke in her early 4s. Father had MI in his late 59s was not a smoker  PMH:   has a past medical history of Allergic rhinitis, Anemia, Anxiety,  Cellulitis, Cholelithiasis, Essential hypertension, History of stress test, Insomnia, Osteoarthritis, Tachycardia, and Valvular heart disease.  PSH:    Past Surgical History:  Procedure Laterality Date  . BACK SURGERY    . BUNIONECTOMY    . CHOLECYSTECTOMY  03/29/2007  . GALLBLADDER SURGERY    . HIP SURGERY     bilateral   . REPLACEMENT TOTAL KNEE Right   . THUMB ARTHROSCOPY      Current Outpatient Medications  Medication Sig Dispense Refill  . acetaminophen (TYLENOL) 500 MG tablet Take 1,000 mg by mouth every 8 (eight) hours as needed for mild pain or moderate pain.    Marland Kitchen ALPRAZolam (XANAX) 0.25 MG tablet TAKE 1 TABLET BY MOUTH AT BEDTIME AS NEEDED 30 tablet 0  . amLODipine (NORVASC) 2.5 MG tablet TAKE 1 TABLET BY MOUTH DAILY 90 tablet 1  . aspirin (ASPIRIN 81) 81 MG EC tablet Take 81 mg by mouth daily. Swallow whole.    . cholecalciferol (VITAMIN D) 1000 UNITS tablet Take 1,000 Units by mouth every other day.     . cyanocobalamin ([employer] VITAMIN B12) 2000 MCG tablet Take by mouth.    . triamterene-hydrochlorothiazide (MAXZIDE-25) 37.5-25 MG tablet TAKE 1 TABLET DAILY 30 tablet 5   No current facility-administered medications for this visit.      Allergies:   Albumen, egg; Clindamycin/lincomycin; Eggs or egg-derived products; Flagyl [metronidazole]; Levaquin [levofloxacin]; Milk-related compounds; Mobic [meloxicam]; Penicillins; Shellfish allergy; Sulfa antibiotics; and Wheat bran   Social History:  The patient  reports that she  has never smoked. She has never used smokeless tobacco. She reports that she does not drink alcohol or use drugs.   Family History:   family history includes CAD in her father; Heart attack in her father; Heart attack (age of onset: 1) in her son; Heart disease in her father and mother; Hypertension in her mother and son; Stroke in her father and mother.    Review of Systems: Review of Systems  Constitutional: Negative.   Respiratory: Negative.    Cardiovascular: Negative.   Gastrointestinal: Negative.   Musculoskeletal: Negative.   Neurological: Negative.   Psychiatric/Behavioral: Negative.   All other systems reviewed and are negative.    PHYSICAL EXAM: VS:  BP 130/70 (BP Location: Left Arm, Patient Position: Sitting, Cuff Size: Normal)   Pulse 80   Ht 5\' 2"  (1.575 m)   Wt 161 lb 8 oz (73.3 kg)   BMI 29.54 kg/m  , BMI Body mass index is 29.54 kg/m. Constitutional:  oriented to person, place, and time. No distress.  HENT:  Head: Normocephalic and atraumatic.  Eyes:  no discharge. No scleral icterus.  Neck: Normal range of motion. Neck supple. No JVD present.  Cardiovascular: Normal rate, regular rhythm, normal heart sounds and intact distal pulses. Exam reveals no gallop and no friction rub. No edema No murmur heard. Pulmonary/Chest: Effort normal and breath sounds normal. No stridor. No respiratory distress.  no wheezes.  no rales.  no tenderness.  Abdominal: Soft.  no distension.  no tenderness.  Musculoskeletal: Normal range of motion.  no  tenderness or deformity.  Neurological:  normal muscle tone. Coordination normal. No atrophy Skin: Skin is warm and dry. No rash noted. not diaphoretic.  Psychiatric:  normal mood and affect. behavior is normal. Thought content normal.    Recent Labs: 10/19/2017: TSH 1.76 01/11/2018: Magnesium 1.8 02/17/2018: ALT 11; Hemoglobin 11.6; Platelets 401.0 03/11/2018: BUN 27; Creatinine, Ser 1.10; Potassium 4.1; Sodium 140    Lipid Panel Lab Results  Component Value Date   CHOL 123 02/17/2018   HDL 48.40 02/17/2018   LDLCALC 40 02/17/2018   TRIG 171.0 (H) 02/17/2018  On Crestor.  She has since stopped the medication    Wt Readings from Last 3 Encounters:  05/10/18 161 lb 8 oz (73.3 kg)  04/27/18 161 lb 3.2 oz (73.1 kg)  03/16/18 157 lb 9.6 oz (71.5 kg)       ASSESSMENT AND PLAN:  Valvular heart disease - Plan: EKG 12-Lead Minimal aortic valve disease, sclerosis without  stenosis No significant murmur appreciated today No further testing needed  Coronary calcification on CT scan Denies anginal symptoms No further work-up at this time Stressed importance of aggressive lipid management  Mixed hyperlipidemia - Plan: EKG 12-Lead CT images pulled up in the office today and discussed with her Goal LDL less than 70 She will try Lipitor 10 mg every other day Slow titration upwards if tolerated Could also try Zetia  Essential hypertension - Plan: EKG 12-Lead Blood pressure is well controlled on today's visit. No changes made to the medications.  Edema extremities - Plan: EKG 12-Lead No significant leg edema on today's visit Stable  Aortic atherosclerosis Seen on CT scan images Stressed the importance of aggressive lipid management   Total encounter time more than 25 minutes  Greater than 50% was spent in counseling and coordination of care with the patient   Disposition:   F/U 12 months as needed   Orders Placed This Encounter  Procedures  . EKG 12-Lead  Signed, Dossie Arbourim Nathaneal Sommers, M.D., Ph.D. 05/10/2018  [facility], [place] [phone]

## 2018-05-10 ENCOUNTER — Ambulatory Visit: Payer: Medicare Other | Admitting: Cardiovascular Disease

## 2018-05-10 ENCOUNTER — Encounter: Payer: Self-pay | Admitting: Cardiovascular Disease

## 2018-05-10 VITALS — BP 130/70 | HR 80 | Ht 62.0 in | Wt 161.5 lb

## 2018-05-10 DIAGNOSIS — I2510 Atherosclerotic heart disease of native coronary artery without angina pectoris: Secondary | ICD-10-CM

## 2018-05-10 DIAGNOSIS — I38 Endocarditis, valve unspecified: Secondary | ICD-10-CM | POA: Diagnosis not present

## 2018-05-10 DIAGNOSIS — I10 Essential (primary) hypertension: Secondary | ICD-10-CM

## 2018-05-10 DIAGNOSIS — R600 Localized edema: Secondary | ICD-10-CM

## 2018-05-10 DIAGNOSIS — R5381 Other malaise: Secondary | ICD-10-CM

## 2018-05-10 DIAGNOSIS — I700 Atherosclerosis of aorta: Secondary | ICD-10-CM | POA: Diagnosis not present

## 2018-05-10 DIAGNOSIS — I739 Peripheral vascular disease, unspecified: Secondary | ICD-10-CM | POA: Diagnosis not present

## 2018-05-10 DIAGNOSIS — E782 Mixed hyperlipidemia: Secondary | ICD-10-CM

## 2018-05-10 DIAGNOSIS — R5383 Other fatigue: Secondary | ICD-10-CM

## 2018-05-10 MED ORDER — ATORVASTATIN CALCIUM 10 MG PO TABS: 10.0000 mg | ORAL_TABLET | Freq: Every day | ORAL | 11 refills | Status: DC

## 2018-05-10 NOTE — Patient Instructions (Signed)
Medication Instructions:   Start atorvastatin 10 mg one pill daily  (start every other day for the first month)  Labwork:  No new labs needed  Testing/Procedures:  No further testing at this time   Follow-Up: It was a pleasure seeing you in the office today. Please call us if you have new issues that need to be addressed before your next appt.  [phone]  Your physician wants you to follow-up in: 12 months.  You will receive a reminder letter in the mail two months in advance. If you don't receive a letter, please call our office to schedule the follow-up appointment.  If you need a refill on your cardiac medications before your next appointment, please call your pharmacy.  For educational health videos Log in to : [email or web address] Or : [email or web address], password : [email or web address]

## 2018-05-31 ENCOUNTER — Other Ambulatory Visit: Payer: Self-pay | Admitting: Internal Medicine

## 2018-05-31 DIAGNOSIS — I10 Essential (primary) hypertension: Secondary | ICD-10-CM

## 2018-06-01 ENCOUNTER — Telehealth: Payer: Self-pay | Admitting: Internal Medicine

## 2018-06-01 NOTE — Telephone Encounter (Signed)
Duplicate request, already routed to provider Rx pool.

## 2018-06-01 NOTE — Telephone Encounter (Signed)
Copied from CRM [id number]. Topic: Quick Communication - Rx Refill/Question >> Jun 01, 2018 11:37 AM Marylen Ponto wrote: Medication: amLODipine (NORVASC) 2.5 MG tablet  Has the patient contacted their pharmacy? yes  Preferred Pharmacy (with phone number or street name): [facility] - [place], [place] - [address] [phone] (Phone) [phone] (Fax)  Agent: Please be advised that RX refills may take up to 3 business days. We ask that you follow-up with your pharmacy.

## 2018-06-18 ENCOUNTER — Telehealth: Payer: Self-pay | Admitting: Internal Medicine

## 2018-06-18 ENCOUNTER — Other Ambulatory Visit: Payer: Self-pay | Admitting: Internal Medicine

## 2018-06-18 DIAGNOSIS — M79606 Pain in leg, unspecified: Secondary | ICD-10-CM

## 2018-06-18 DIAGNOSIS — M79643 Pain in unspecified hand: Secondary | ICD-10-CM

## 2018-06-18 NOTE — Telephone Encounter (Signed)
Patient would like a referral to physical therapy

## 2018-06-18 NOTE — Progress Notes (Signed)
Order placed for physical therapy referral.   

## 2018-06-18 NOTE — Telephone Encounter (Signed)
Order placed for referral to physical therapy.  

## 2018-06-18 NOTE — Telephone Encounter (Signed)
Copied from CRM [id number]. Topic: Referral - Request for Referral >> Jun 18, 2018 11:42 AM Maia Petties wrote: Has patient seen PCP for this complaint? yes *If NO, is insurance requiring patient see PCP for this issue before PCP can refer them? Referral for which specialty: Physical Therapy Preferred provider/office: [facility] Reason for referral: pt states she has arthritis in hands and legs and PT has helped in the past

## 2018-06-22 ENCOUNTER — Encounter: Payer: Self-pay | Admitting: Internal Medicine

## 2018-06-23 ENCOUNTER — Ambulatory Visit: Payer: Medicare Other

## 2018-06-24 ENCOUNTER — Telehealth: Payer: Self-pay

## 2018-06-24 NOTE — Telephone Encounter (Signed)
duplicate

## 2018-06-24 NOTE — Telephone Encounter (Signed)
It looks like this referral was placed to [facility] PT on 06/18/18. Is this something you can help with?

## 2018-06-24 NOTE — Telephone Encounter (Signed)
Copied from CRM [id number]. Topic: Referral - Request for Referral >> Jun 18, 2018 11:42 AM Maia Petties wrote: Has patient seen PCP for this complaint? yes *If NO, is insurance requiring patient see PCP for this issue before PCP can refer them? Referral for which specialty: Physical Therapy Preferred provider/office: [facility] Reason for referral: pt states she has arthritis in hands and legs and PT has helped in the past >> Jun 24, 2018 10:41 AM Leafy Ro wrote: Pt is calling and [facility] has not received the referral please fax to  [phone]

## 2018-06-24 NOTE — Telephone Encounter (Signed)
Copied from CRM [id number]. Topic: Referral - Request for Referral >> Jun 18, 2018 11:42 AM Maia Petties wrote: Has patient seen PCP for this complaint? yes *If NO, is insurance requiring patient see PCP for this issue before PCP can refer them? Referral for which specialty: Physical Therapy Preferred provider/office: [facility] Reason for referral: pt states she has arthritis in hands and legs and PT has helped in the past >> Jun 24, 2018 10:41 AM Leafy Ro wrote: Pt is calling and [facility] has not received the referral please fax to  [phone]  Please advise?

## 2018-06-25 NOTE — Telephone Encounter (Signed)
Patient has been contacted with an appt.

## 2018-06-25 NOTE — Telephone Encounter (Signed)
It has been sent. It usually does take a couple days for them to schedule since they receive a lot of referrals. She should be contacted within the next few days, at least by Tuesday. If she hasn't heard by then she can call [phone].

## 2018-07-21 ENCOUNTER — Ambulatory Visit: Payer: Medicare Other | Attending: Internal Medicine

## 2018-07-21 DIAGNOSIS — M6281 Muscle weakness (generalized): Secondary | ICD-10-CM | POA: Diagnosis not present

## 2018-07-21 DIAGNOSIS — R262 Difficulty in walking, not elsewhere classified: Secondary | ICD-10-CM | POA: Insufficient documentation

## 2018-07-22 NOTE — Therapy (Signed)
[place] [facility] PHYSICAL AND SPORTS MEDICINE [address], [place], [address] Phone: [phone]   Fax:  [phone]  Physical Therapy Evaluation  Patient Details  Name: Alicia Mccann MRN: [id number] Date of Birth: [birth year] Referring Provider (PT): Dale Hernandez MD   Encounter Date: 07/21/2018  PT End of Session - 07/21/18 1256    Visit Number  1    Number of Visits  13    Date for PT Re-Evaluation  09/01/18    Authorization Type  1 /10 G Code    PT Start Time  1030    PT Stop Time  1130    PT Time Calculation (min)  60 min    Activity Tolerance  Patient tolerated treatment well    Behavior During Therapy  [facility] for tasks assessed/performed       Past Medical History:  Diagnosis Date  . Allergic rhinitis   . Anemia   . Anxiety   . Cellulitis   . Cholelithiasis   . Essential hypertension   . History of stress test    a. 12/2008 Ex MV: EF 78%, no ischemia.  . Insomnia   . Osteoarthritis   . Tachycardia   . Valvular heart disease    a. 12/2008 Echo: EF 65%, mild MR;  b. 2/6 SEM RUSB - insignificant murmur, prob Ao Sclerosis.    Past Surgical History:  Procedure Laterality Date  . BACK SURGERY    . BUNIONECTOMY    . CHOLECYSTECTOMY  03/29/2007  . GALLBLADDER SURGERY    . HIP SURGERY     bilateral   . REPLACEMENT TOTAL KNEE Right   . THUMB ARTHROSCOPY      There were no vitals filed for this visit.   Subjective Assessment - 07/21/18 1240    Subjective  Patient states increased difficulty with performing walking motions and feels "unsteady" with walking and reports increased unsteadiness with performing ascending and desending the stairs. Patient states she performs asending and desending using a step-to gait pattern. Patient states she has not been performing an HEP. Patient reports she avoids going out in the public secondary to inability to rise from a chair. Patient reports some balance difficulties with walking.       Pertinent History  TKA right LE 01/04/2018. long history of right knee pain prior to this surgery and has had bilateral THA 2010, 2011. she has had home health therapy and is now referred to out patient therapy     Limitations  Sitting;Standing;Walking;House hold activities    How long can you sit comfortably?  unlimited    How long can you stand comfortably?  unlimited    How long can you walk comfortably?  short distances    Patient Stated Goals  to return to prior level of function and be able to take care of her dog, walking dog     Currently in Pain?  No/denies         [facility] PT Assessment - 07/22/18 0847      Assessment   Medical Diagnosis  Difficulties in walking    Referring Provider (PT)  Dale Hartshorne MD    Onset Date/Surgical Date  01/04/18    Hand Dominance  Right    Prior Therapy  Yes for TKA - OP      Precautions   Precautions  None      Balance Screen   Has the patient fallen in the past 6 months  No  Home Environment   Living Environment  Private residence    Living Arrangements  Alone    Available Help at Discharge  Friend(s)    Type of Home  House    Home Access  Stairs to enter    Entrance Stairs-Number of Steps  3    Entrance Stairs-Rails  Right    Home Layout  One level      Prior Function   Level of Independence  Independent    Vocation  Retired    GafferVocation Requirements  N/A    Leisure  Walking dogs      Cognition   Overall Cognitive Status  Within Functional Limits for tasks assessed      Sensation   Light Touch  Appears Intact      Functional Tests   Functional tests  Single leg stance;Sit to Stand      Single Leg Stance   Comments  SLS with intermittent UE support able to perform for 5 sec B      Sit to Stand   Comments  Patient performs with UE support at decreased speed      ROM / Strength   AROM / PROM / Strength  Strength;AROM      AROM   AROM Assessment Site  Knee    Right/Left Knee  Right;Left    Right Knee Extension   0    Right Knee Flexion  100    Left Knee Extension  0    Left Knee Flexion  100      Strength   Strength Assessment Site  Knee;Hip    Right/Left Hip  Right;Left    Right Hip Flexion  4+/5    Right Hip External Rotation   3+/5    Right Hip Internal Rotation  4/5    Right Hip ABduction  4-/5    Right Hip ADduction  4-/5    Left Hip Flexion  4+/5    Left Hip External Rotation  3+/5    Left Hip Internal Rotation  4/5    Left Hip ABduction  4-/5    Left Hip ADduction  4-/5    Right/Left Knee  Right;Left    Right Knee Flexion  4-/5    Right Knee Extension  4+/5    Left Knee Flexion  4-/5    Left Knee Extension  4+/5      Transfers   Five time sit to stand comments   13.5sec      Ambulation/Gait   Gait Comments  Circumduction on the R, decreased heel strike on the R, decreased speed, increased hip ER on the R      Standardized Balance Assessment   Standardized Balance Assessment  10 meter walk test    10 Meter Walk  1.4121m/s      Functional Gait  Assessment   Gait assessed   Yes    Gait Level Surface  Walks 20 ft in less than 5.5 sec, no assistive devices, good speed, no evidence for imbalance, normal gait pattern, deviates no more than 6 in outside of the 12 in walkway width.    Change in Gait Speed  Able to smoothly change walking speed without loss of balance or gait deviation. Deviate no more than 6 in outside of the 12 in walkway width.    Gait with Horizontal Head Turns  Performs head turns smoothly with no change in gait. Deviates no more than 6 in outside 12 in walkway width  Gait with Vertical Head Turns  Performs head turns with no change in gait. Deviates no more than 6 in outside 12 in walkway width.    Gait and Pivot Turn  Pivot turns safely within 3 sec and stops quickly with no loss of balance.    Step Over Obstacle  Is able to step over one shoe box (4.5 in total height) but must slow down and adjust steps to clear box safely. May require verbal cueing.    Gait  with Narrow Base of Support  Ambulates 7-9 steps.    Gait with Eyes Closed  Walks 20 ft, uses assistive device, slower speed, mild gait deviations, deviates 6-10 in outside 12 in walkway width. Ambulates 20 ft in less than 9 sec but greater than 7 sec.    Ambulating Backwards  Walks 20 ft, no assistive devices, good speed, no evidence for imbalance, normal gait    Steps  Two feet to a stair, must use rail.    Total Score  24       Objective measurements completed on examination: See above findings.   TREATMENT Therapeutic Exercise: Asecnding /desending stairs -- x 20 Sit to stands in standing -- x 10  Patient demonstrates increased confidence the movement       PT Education - 07/21/18 1255    Education provided  Yes    Education Details  form/technique with exercise; HEP: asending desending the stairs; sit to stands    Person(s) Educated  Patient    Methods  Explanation;Demonstration    Comprehension  Verbalized understanding;Returned demonstration          PT Long Term Goals - 07/22/18 0841      PT LONG TERM GOAL #1   Title  Patient will be independent with HEP to continue benefits of therapy after discharge.    Baseline  Dependent with form/technique    Time  6    Period  Weeks    Status  New    Target Date  09/02/18      PT LONG TERM GOAL #2   Title  Patient will improve 5xSTS score to under 10sec to indicating decreased fall risk and improvement in functional LE strength.     Baseline  13.5sec    Time  6    Period  Weeks    Status  New    Target Date  09/02/17      PT LONG TERM GOAL #3   Title  Patient will be able to ascend/descend the stairs without UE support to better able to perform stairs in community settings.     Baseline  Step to gait pattern with B UE support    Time  6    Period  Weeks    Status  New    Target Date  09/02/17             Plan - 07/21/18 1501    Clinical Impression Statement  Patient is a 84 yo right hand dominant  female presenting with increased difficulty with performing the acsending and desending of stairs with increased fall risk. Patient demonstrates increased fall risk as indicated by demonstrating decreased SLS on the affected side, decreased FGA, and decreased sit to stand scores. Patient also demonstrates poor motor control with stepping motions and will benefit from further skilled therapy to return to prior level of function.     History and Personal Factors relevant to plan of care:  previous TKA    Clinical  Presentation  Stable    Clinical Presentation due to:  no pain    Clinical Decision Making  Low    Rehab Potential  Good    Clinical Impairments Affecting Rehab Potential  (-)age, multiple co morbidities (+) highly motivated    PT Frequency  2x / week    PT Duration  6 weeks    PT Treatment/Interventions  Cryotherapy;Electrical Stimulation;Moist Heat;Neuromuscular re-education;Patient/family education;Manual techniques;Therapeutic exercise;Balance training;Gait training;Functional mobility training;Therapeutic activities;Iontophoresis 4mg /ml Dexamethasone;Passive range of motion;Dry needling    PT Next Visit Plan  Progress balance and strength    PT Home Exercise Plan  See education section    Consulted and Agree with Plan of Care  Patient       Patient will benefit from skilled therapeutic intervention in order to improve the following deficits and impairments:  Decreased strength, Pain, Decreased activity tolerance, Difficulty walking, Impaired perceived functional ability, Increased muscle spasms, Decreased range of motion, Decreased endurance, Abnormal gait  Visit Diagnosis: Muscle weakness (generalized)  Difficulty in walking, not elsewhere classified     Problem List Patient Active Problem List   Diagnosis Date Noted  . Aortic atherosclerosis (HCC) 05/10/2018  . Coronary artery calcification seen on CAT scan 05/10/2018  . Decreased hearing 05/02/2018  . History of total  right knee replacement 01/21/2018  . Chronic constipation   . Noninfectious gastroenteritis   . Lung nodule 01/17/2018  . Anemia 01/14/2018  . Hyperglycemia 01/09/2018  . Colitis 01/09/2018  . Right knee pain 11/28/2017  . Light headedness 10/21/2017  . Healthcare maintenance 06/25/2017  . CKD (chronic kidney disease) stage 3, GFR 30-59 ml/min (HCC) 06/25/2017  . Malaise and fatigue 01/05/2017  . PAD (peripheral artery disease) (HCC) 05/19/2016  . Valvular heart disease   . Diverticulitis 05/25/2015  . Abdominal pain 05/24/2015  . Allergic rhinitis 04/30/2015  . Absolute anemia 04/30/2015  . Anxiety 04/30/2015  . Carpal tunnel syndrome 04/30/2015  . Chronic LBP 04/30/2015  . Edema extremities 04/30/2015  . Cannot sleep 04/30/2015  . Arthritis sicca 04/30/2015  . Seasonal affective disorder (HCC) 04/30/2015  . Heart murmur 06/27/2013  . Essential hypertension 06/27/2013  . Osteoarthritis 06/27/2013  . Mixed hyperlipidemia 06/27/2013  . History of repair of hip joint 07/08/2012  . Cholelithiasis without obstruction 03/22/2007    Myrene Galas, PT DPT 07/22/2018, 9:04 AM  [place] [facility] PHYSICAL AND SPORTS MEDICINE [address], [place], [address] Phone: [phone]   Fax:  [phone]  Name: DARION JUHASZ MRN: [id number] Date of Birth: August 24, 1932

## 2018-07-26 ENCOUNTER — Ambulatory Visit: Payer: Medicare Other

## 2018-07-26 DIAGNOSIS — M6281 Muscle weakness (generalized): Secondary | ICD-10-CM

## 2018-07-26 DIAGNOSIS — R262 Difficulty in walking, not elsewhere classified: Secondary | ICD-10-CM

## 2018-07-26 NOTE — Therapy (Signed)
[place] [facility] PHYSICAL AND SPORTS MEDICINE [address] [place], [place], [address] Phone: [phone]   Fax:  [phone]  Physical Therapy Treatment  Patient Details  Name: Alicia Mccann MRN: [id number] Date of Birth: 1932-12-06 Referring Provider (PT): Dale Durhamharlene Scott MD   Encounter Date: 07/26/2018  PT End of Session - 07/26/18 1058    Visit Number  2    Number of Visits  13    Date for PT Re-Evaluation  09/01/18    Authorization Type  2 /10 G Code    PT Start Time  1030    PT Stop Time  1115    PT Time Calculation (min)  45 min    Activity Tolerance  Patient tolerated treatment well    Behavior During Therapy  [facility] for tasks assessed/performed       Past Medical History:  Diagnosis Date  . Allergic rhinitis   . Anemia   . Anxiety   . Cellulitis   . Cholelithiasis   . Essential hypertension   . History of stress test    a. 12/2008 Ex MV: EF 78%, no ischemia.  . Insomnia   . Osteoarthritis   . Tachycardia   . Valvular heart disease    a. 12/2008 Echo: EF 65%, mild MR;  b. 2/6 SEM RUSB - insignificant murmur, prob Ao Sclerosis.    Past Surgical History:  Procedure Laterality Date  . BACK SURGERY    . BUNIONECTOMY    . CHOLECYSTECTOMY  03/29/2007  . GALLBLADDER SURGERY    . HIP SURGERY     bilateral   . REPLACEMENT TOTAL KNEE Right   . THUMB ARTHROSCOPY      There were no vitals filed for this visit.  Subjective Assessment - 07/26/18 1038    Subjective  Patient reports she continues to have difficulties with asending/desending the stairs. Patient states she had some weakness along her LEs with walking.     Pertinent History  TKA right LE 01/04/2018. long history of right knee pain prior to this surgery and has had bilateral THA 2010, 2011. she has had home health therapy and is now referred to out patient therapy     Limitations  Sitting;Standing;Walking;House hold activities    How long can you sit comfortably?   unlimited    How long can you walk comfortably?  short distances    Patient Stated Goals  to return to prior level of function and be able to take care of her dog, walking dog     Currently in Pain?  No/denies       TREATMENT  Therapeutic Exerscise  Asending/desending stairs - x 20 with slowly lesser UE support as she progresses Forward lunges in standing - x 10 B Step up onto 8" step - x 10 Stepping on balance stone - x15 Foam SLS - 3 x 30sec SLS on ground - 2 x 30sec Semi tandem stance on ground - 3 x 30sec  Hip abduction in standing - x 10 Hip abduction with GTB - 2 x 10  Patient demonstrates improvement in stair stepping at end of the session  PT Education - 07/26/18 1055    Education provided  Yes    Education Details  form/technique with exercise     Person(s) Educated  Patient    Methods  Explanation;Demonstration    Comprehension  Verbalized understanding;Returned demonstration          PT Long Term Goals - 07/22/18 [phone]  PT LONG TERM GOAL #1   Title  Patient will be independent with HEP to continue benefits of therapy after discharge.    Baseline  Dependent with form/technique    Time  6    Period  Weeks    Status  New    Target Date  09/02/18      PT LONG TERM GOAL #2   Title  Patient will improve 5xSTS score to under 10sec to indicating decreased fall risk and improvement in functional LE strength.     Baseline  13.5sec    Time  6    Period  Weeks    Status  New    Target Date  09/02/17      PT LONG TERM GOAL #3   Title  Patient will be able to ascend/descend the stairs without UE support to better able to perform stairs in community settings.     Baseline  Step to gait pattern with B UE support    Time  6    Period  Weeks    Status  New    Target Date  09/02/17            Plan - 07/26/18 1329    Clinical Impression Statement  Patient continues to have difficulty with performing exercises with narrow BOS indicating poor dynamic and  static balance. Although patient does have difficulty with this she was able to perform the stairs with out UE support indicating functional carryover between sessions. PAtient will benefit from further skilled therapy to return to prior level of function.     Rehab Potential  Good    Clinical Impairments Affecting Rehab Potential  (-)age, multiple co morbidities (+) highly motivated    PT Frequency  2x / week    PT Duration  6 weeks    PT Treatment/Interventions  Cryotherapy;Electrical Stimulation;Moist Heat;Neuromuscular re-education;Patient/family education;Manual techniques;Therapeutic exercise;Balance training;Gait training;Functional mobility training;Therapeutic activities;Iontophoresis 4mg /ml Dexamethasone;Passive range of motion;Dry needling    PT Next Visit Plan  Progress balance and strength    PT Home Exercise Plan  See education section    Consulted and Agree with Plan of Care  Patient       Patient will benefit from skilled therapeutic intervention in order to improve the following deficits and impairments:  Decreased strength, Pain, Decreased activity tolerance, Difficulty walking, Impaired perceived functional ability, Increased muscle spasms, Decreased range of motion, Decreased endurance, Abnormal gait  Visit Diagnosis: Muscle weakness (generalized)  Difficulty in walking, not elsewhere classified     Problem List Patient Active Problem List   Diagnosis Date Noted  . Aortic atherosclerosis (HCC) 05/10/2018  . Coronary artery calcification seen on CAT scan 05/10/2018  . Decreased hearing 05/02/2018  . History of total right knee replacement 01/21/2018  . Chronic constipation   . Noninfectious gastroenteritis   . Lung nodule 01/17/2018  . Anemia 01/14/2018  . Hyperglycemia 01/09/2018  . Colitis 01/09/2018  . Right knee pain 11/28/2017  . Light headedness 10/21/2017  . Healthcare maintenance 06/25/2017  . CKD (chronic kidney disease) stage 3, GFR 30-59 ml/min  (HCC) 06/25/2017  . Malaise and fatigue 01/05/2017  . PAD (peripheral artery disease) (HCC) 05/19/2016  . Valvular heart disease   . Diverticulitis 05/25/2015  . Abdominal pain 05/24/2015  . Allergic rhinitis 04/30/2015  . Absolute anemia 04/30/2015  . Anxiety 04/30/2015  . Carpal tunnel syndrome 04/30/2015  . Chronic LBP 04/30/2015  . Edema extremities 04/30/2015  . Cannot sleep 04/30/2015  . Arthritis sicca 04/30/2015  .  Seasonal affective disorder (HCC) 04/30/2015  . Heart murmur 06/27/2013  . Essential hypertension 06/27/2013  . Osteoarthritis 06/27/2013  . Mixed hyperlipidemia 06/27/2013  . History of repair of hip joint 07/08/2012  . Cholelithiasis without obstruction 03/22/2007    Myrene Galas, PT DPT 07/26/2018, 1:32 PM  [place] [facility] PHYSICAL AND SPORTS MEDICINE [address] [place], [address] Phone: [phone]   Fax:  [phone]  Name: Alicia Mccann MRN: [id number] Date of Birth: 11-01-1932

## 2018-07-28 ENCOUNTER — Ambulatory Visit: Payer: Medicare Other

## 2018-07-28 DIAGNOSIS — M6281 Muscle weakness (generalized): Secondary | ICD-10-CM | POA: Diagnosis not present

## 2018-07-28 DIAGNOSIS — R262 Difficulty in walking, not elsewhere classified: Secondary | ICD-10-CM

## 2018-07-28 NOTE — Therapy (Signed)
[place] [facility] [address] [place], [address] Phone: [phone]   Fax:  [phone]  Physical Therapy Treatment  Patient Details  Name: Alicia Mccann MRN: [id number] Date of Birth: December 07, 1932 Referring Provider (PT): Dale Chinle MD   Encounter Date: 07/28/2018  PT End of Session - 07/28/18 1128    Visit Number  3    Number of Visits  13    Date for PT Re-Evaluation  09/01/18    Authorization Type  3/10 G Code    PT Start Time  1030    PT Stop Time  1115    PT Time Calculation (min)  45 min    Activity Tolerance  Patient tolerated treatment well    Behavior During Therapy  [facility] for tasks assessed/performed       Past Medical History:  Diagnosis Date  . Allergic rhinitis   . Anemia   . Anxiety   . Cellulitis   . Cholelithiasis   . Essential hypertension   . History of stress test    a. 12/2008 Ex MV: EF 78%, no ischemia.  . Insomnia   . Osteoarthritis   . Tachycardia   . Valvular heart disease    a. 12/2008 Echo: EF 65%, mild MR;  b. 2/6 SEM RUSB - insignificant murmur, prob Ao Sclerosis.    Past Surgical History:  Procedure Laterality Date  . BACK SURGERY    . BUNIONECTOMY    . CHOLECYSTECTOMY  03/29/2007  . GALLBLADDER SURGERY    . HIP SURGERY     bilateral   . REPLACEMENT TOTAL KNEE Right   . THUMB ARTHROSCOPY      There were no vitals filed for this visit.  Subjective Assessment - 07/28/18 1126    Subjective  Patient reports improvement with performing stepping up and down at the end of the session.     Pertinent History  TKA right LE 01/04/2018. long history of right knee pain prior to this surgery and has had bilateral THA 2010, 2011. she has had home health therapy and is now referred to out patient therapy     Limitations  Sitting;Standing;Walking;House hold activities    How long can you sit comfortably?  unlimited    How long can you walk comfortably?  short distances     Patient Stated Goals  to return to prior level of function and be able to take care of her dog, walking dog     Currently in Pain?  No/denies       TREATMENT  Therapeutic Exerscise  Asending/desending stairs - x 20 with slowly lesser UE support as she progresses Forward lunges in standing - x 10 B Step up onto 8" step  then onto 16" afterwards. - x 10 Side lunges in standing - x 10 B Hip abduction with hip extension in standing with furniture slider - x 20  Sit to stands with weighted ball - x 20 3kg  Patient demonstrates improvement in stair stepping at end of the session   PT Education - 07/28/18 1128    Education provided  Yes    Education Details  form/technique with exercise; stairs     Person(s) Educated  Patient    Methods  Explanation;Demonstration    Comprehension  Verbalized understanding;Returned demonstration          PT Long Term Goals - 07/22/18 0841      PT LONG TERM GOAL #1   Title  Patient will be independent with HEP to continue benefits of therapy after discharge.    Baseline  Dependent with form/technique    Time  6    Period  Weeks    Status  New    Target Date  09/02/18      PT LONG TERM GOAL #2   Title  Patient will improve 5xSTS score to under 10sec to indicating decreased fall risk and improvement in functional LE strength.     Baseline  13.5sec    Time  6    Period  Weeks    Status  New    Target Date  09/02/17      PT LONG TERM GOAL #3   Title  Patient will be able to ascend/descend the stairs without UE support to better able to perform stairs in community settings.     Baseline  Step to gait pattern with B UE support    Time  6    Period  Weeks    Status  New    Target Date  09/02/17            Plan - 07/28/18 1129    Clinical Impression Statement  Patient demonstrates improvement with abiltiy to asend/desend the stairs requiring less UE support compared to previous session. Patient conitnues to have dificulty with single  leg stance and narrow base of support. Patient will benefit from further skilled therapy to return to prior level of function.     Rehab Potential  Good    Clinical Impairments Affecting Rehab Potential  (-)age, multiple co morbidities (+) highly motivated    PT Frequency  2x / week    PT Duration  6 weeks    PT Treatment/Interventions  Cryotherapy;Electrical Stimulation;Moist Heat;Neuromuscular re-education;Patient/family education;Manual techniques;Therapeutic exercise;Balance training;Gait training;Functional mobility training;Therapeutic activities;Iontophoresis 4mg /ml Dexamethasone;Passive range of motion;Dry needling    PT Next Visit Plan  Progress balance and strength    PT Home Exercise Plan  See education section    Consulted and Agree with Plan of Care  Patient       Patient will benefit from skilled therapeutic intervention in order to improve the following deficits and impairments:  Decreased strength, Pain, Decreased activity tolerance, Difficulty walking, Impaired perceived functional ability, Increased muscle spasms, Decreased range of motion, Decreased endurance, Abnormal gait  Visit Diagnosis: Muscle weakness (generalized)  Difficulty in walking, not elsewhere classified     Problem List Patient Active Problem List   Diagnosis Date Noted  . Aortic atherosclerosis (HCC) 05/10/2018  . Coronary artery calcification seen on CAT scan 05/10/2018  . Decreased hearing 05/02/2018  . History of total right knee replacement 01/21/2018  . Chronic constipation   . Noninfectious gastroenteritis   . Lung nodule 01/17/2018  . Anemia 01/14/2018  . Hyperglycemia 01/09/2018  . Colitis 01/09/2018  . Right knee pain 11/28/2017  . Light headedness 10/21/2017  . Healthcare maintenance 06/25/2017  . CKD (chronic kidney disease) stage 3, GFR 30-59 ml/min (HCC) 06/25/2017  . Malaise and fatigue 01/05/2017  . PAD (peripheral artery disease) (HCC) 05/19/2016  . Valvular heart disease    . Diverticulitis 05/25/2015  . Abdominal pain 05/24/2015  . Allergic rhinitis 04/30/2015  . Absolute anemia 04/30/2015  . Anxiety 04/30/2015  . Carpal tunnel syndrome 04/30/2015  . Chronic LBP 04/30/2015  . Edema extremities 04/30/2015  . Cannot sleep 04/30/2015  . Arthritis sicca 04/30/2015  . Seasonal affective disorder (HCC) 04/30/2015  . Heart murmur 06/27/2013  . Essential hypertension 06/27/2013  .  Osteoarthritis 06/27/2013  . Mixed hyperlipidemia 06/27/2013  . History of repair of hip joint 07/08/2012  . Cholelithiasis without obstruction 03/22/2007    Myrene Galas, PT DPT 07/28/2018, 11:31 AM  [place] [facility] SPORTS MEDICINE [address] [place], [address] Phone: [phone]   Fax:  [phone]  Name: Alicia Mccann MRN: [id number] Date of Birth: 1933-05-12

## 2018-08-02 ENCOUNTER — Ambulatory Visit: Payer: Medicare Other

## 2018-08-04 ENCOUNTER — Ambulatory Visit: Payer: Medicare Other

## 2018-08-05 ENCOUNTER — Ambulatory Visit: Payer: Medicare Other | Admitting: Family Medicine

## 2018-08-05 ENCOUNTER — Encounter: Payer: Self-pay | Admitting: Family Medicine

## 2018-08-05 VITALS — BP 130/60 | HR 97 | Temp 99.3°F | Ht 62.0 in | Wt 166.6 lb

## 2018-08-05 DIAGNOSIS — R1032 Left lower quadrant pain: Secondary | ICD-10-CM | POA: Diagnosis not present

## 2018-08-05 NOTE — Patient Instructions (Signed)
Drink clear liquids for the next day or 2, avoid milk products for a few days. Clear liquids are things like water, gatorade, pedialyte, chicken/beef/vegetable broth, jello, ginger ale, sprite  Bland Diet A bland diet consists of foods that are often soft and do not have a lot of fat, fiber, or extra seasonings. Foods without fat, fiber, or seasoning are easier for the body to digest. They are also less likely to irritate your mouth, throat, stomach, and other parts of your digestive system. A bland diet is sometimes called a BRAT diet. What is my plan? Your health care provider or food and nutrition specialist (dietitian) may recommend specific changes to your diet to prevent symptoms or to treat your symptoms. These changes may include:  Eating small meals often.  Cooking food until it is soft enough to chew easily.  Chewing your food well.  Drinking fluids slowly.  Not eating foods that are very spicy, sour, or fatty.  Not eating citrus fruits, such as oranges and grapefruit. What do I need to know about this diet?  Eat a variety of foods from the bland diet food list.  Do not follow a bland diet longer than needed.  Ask your health care provider whether you should take vitamins or supplements. What foods can I eat? Grains  Hot cereals, such as cream of wheat. Rice. Bread, crackers, or tortillas made from refined white flour. Vegetables Canned or cooked vegetables. Mashed or boiled potatoes. Fruits  Bananas. Applesauce. Other types of cooked or canned fruit with the skin and seeds removed, such as canned peaches or pears. Meats and other proteins  Scrambled eggs. Creamy peanut butter or other nut butters. Lean, well-cooked meats, such as chicken or fish. Tofu. Soups or broths. Dairy Low-fat dairy products, such as milk, cottage cheese, or yogurt. Beverages  Water. Herbal tea. Apple juice. Fats and oils Mild salad dressings. Canola or olive oil. Sweets and  desserts Pudding. Custard. Fruit gelatin. Ice cream. The items listed above may not be a complete list of recommended foods and beverages. Contact a dietitian for more options. What foods are not recommended? Grains Whole grain breads and cereals. Vegetables Raw vegetables. Fruits Raw fruits, especially citrus, berries, or dried fruits. Dairy Whole fat dairy foods. Beverages Caffeinated drinks. Alcohol. Seasonings and condiments Strongly flavored seasonings or condiments. Hot sauce. Salsa. Other foods Spicy foods. Fried foods. Sour foods, such as pickled or fermented foods. Foods with high sugar content. Foods high in fiber. The items listed above may not be a complete list of foods and beverages to avoid. Contact a dietitian for more information. Summary  A bland diet consists of foods that are often soft and do not have a lot of fat, fiber, or extra seasonings.  Foods without fat, fiber, or seasoning are easier for the body to digest.  Check with your health care provider to see how long you should follow this diet plan. It is not meant to be followed for long periods. This information is not intended to replace advice given to you by your health care provider. Make sure you discuss any questions you have with your health care provider. Document Released: 11/26/2015 Document Revised: 09/02/2017 Document Reviewed: 09/02/2017 Elsevier Interactive Patient Education  2019 [employer]

## 2018-08-05 NOTE — Progress Notes (Signed)
Subjective:    Patient ID: Alicia Mccann, female    DOB: 02-Apr-1933, 84 y.o.   MRN: [id number]  HPI  Presents to clinic c/o "diverticulitis acting up".  Patient states the past couple of nights she has noticed her stomach grumbling a lot and some pain on the left side.  Denies any nausea, vomiting or diarrhea.  Bowel movements have been soft and regular.  Her appetite has been stable, states she is been eating a lot of nuts lately.  Denies any fever or chills.  Denies any issues urinating.  Patient Active Problem List   Diagnosis Date Noted  . Aortic atherosclerosis (HCC) 05/10/2018  . Coronary artery calcification seen on CAT scan 05/10/2018  . Decreased hearing 05/02/2018  . History of total right knee replacement 01/21/2018  . Chronic constipation   . Noninfectious gastroenteritis   . Lung nodule 01/17/2018  . Anemia 01/14/2018  . Hyperglycemia 01/09/2018  . Colitis 01/09/2018  . Right knee pain 11/28/2017  . Light headedness 10/21/2017  . Healthcare maintenance 06/25/2017  . CKD (chronic kidney disease) stage 3, GFR 30-59 ml/min (HCC) 06/25/2017  . Malaise and fatigue 01/05/2017  . PAD (peripheral artery disease) (HCC) 05/19/2016  . Valvular heart disease   . Diverticulitis 05/25/2015  . Abdominal pain 05/24/2015  . Allergic rhinitis 04/30/2015  . Absolute anemia 04/30/2015  . Anxiety 04/30/2015  . Carpal tunnel syndrome 04/30/2015  . Chronic LBP 04/30/2015  . Edema extremities 04/30/2015  . Cannot sleep 04/30/2015  . Arthritis sicca 04/30/2015  . Seasonal affective disorder (HCC) 04/30/2015  . Heart murmur 06/27/2013  . Essential hypertension 06/27/2013  . Osteoarthritis 06/27/2013  . Mixed hyperlipidemia 06/27/2013  . History of repair of hip joint 07/08/2012  . Cholelithiasis without obstruction 03/22/2007   Social History   Tobacco Use  . Smoking status: Never Smoker  . Smokeless tobacco: Never Used  Substance Use Topics  . Alcohol use: No     Review of Systems  Constitutional: Negative for chills, fatigue and fever.  HENT: Negative for congestion, ear pain, sinus pain and sore throat.   Eyes: Negative.   Respiratory: Negative for cough, shortness of breath and wheezing.   Cardiovascular: Negative for chest pain, palpitations and leg swelling.  Gastrointestinal: +ABD pain, left sided.  Genitourinary: Negative for dysuria, frequency and urgency.  Musculoskeletal: Negative for arthralgias and myalgias.  Skin: Negative for color change, pallor and rash.  Neurological: Negative for syncope, light-headedness and headaches.  Psychiatric/Behavioral: The patient is not nervous/anxious.       Objective:   Physical Exam  Constitutional: She appears well-developed and well-nourished. No distress. Non toxic.  HENT:  Head: Normocephalic and atraumatic.  Eyes: EOM are normal. No scleral icterus.  Neck: Normal range of motion. Neck supple. No tracheal deviation present.  Cardiovascular: Normal rate, regular rhythm and normal heart sounds.  Pulmonary/Chest: Effort normal and breath sounds normal. No respiratory distress. She has no wheezes. She has no rales.  Abdominal: Soft. Bowel sounds are normal.  Very mild left-lower quadrant ABD tenderness. No rebound or guarding. No mass or hernia palpated.  Neurological: She is alert and oriented to person, place, and time.  Gait normal  Skin: Skin is warm and dry. No pallor.  Psychiatric: She has a normal mood and affect. Her behavior is normal.  Nursing note and vitals reviewed.   Vitals:   08/05/18 1548  BP: 130/60  Pulse: 97  Temp: 99.3 F (37.4 C)  SpO2: 95%  Assessment & Plan:    A total of 25  minutes were spent face-to-face with the patient during this encounter and over half of that time was spent on counseling and coordination of care. The patient was counseled on treatment options, lab work we will be getting, what foods/drinks she can have on clear liquids &  bland foods diet.   Left lower quadrant abdominal pain-discussed different options.  Patient's pain is not that bad, so patient and I both agree that she can follow a clear liquid and bland food diet for the next few days and monitor her symptoms.  Patient has multiple allergies including allergies to many different antibiotics (allergy to clindamycin, Flagyl, Levaquin, penicillins, sulfa) -so she would prefer not to have to take an antibiotic if she really does not need to.  We will get CBC and CMP in clinic today to evaluate for any elevated white cells, look for any anemia, check stability of electrolytes, liver or kidney functions.  Patient advised that oftentimes got rest from eating bland foods and drinking only clear liquids can be really helpful and allows stomach and intestines time to recuperate.  Patient advised that if her pain becomes worse rather than better we will most likely have to move forward with CT scan imaging and or antibiotic treatment.  Patient will keep regularly scheduled follow-up with PCP as planned.  Advised to call office if pain does not improve over the next few days.

## 2018-08-06 LAB — COMPREHENSIVE METABOLIC PANEL
ALBUMIN: 4.3 g/dL (ref 3.5–5.2)
ALT: 13 U/L (ref 0–35)
AST: 13 U/L (ref 0–37)
Alkaline Phosphatase: 76 U/L (ref 39–117)
BUN: 27 mg/dL — ABNORMAL HIGH (ref 6–23)
CO2: 26 mEq/L (ref 19–32)
Calcium: 9.8 mg/dL (ref 8.4–10.5)
Chloride: 101 mEq/L (ref 96–112)
Creatinine, Ser: 1.19 mg/dL (ref 0.40–1.20)
GFR: 46.02 mL/min — ABNORMAL LOW (ref >60.00)
Glucose, Bld: 108 mg/dL — ABNORMAL HIGH (ref 70–99)
Potassium: 4 mEq/L (ref 3.5–5.1)
SODIUM: 136 meq/L (ref 135–145)
Total Bilirubin: 0.8 mg/dL (ref 0.2–1.2)
Total Protein: 7 g/dL (ref 6.0–8.3)

## 2018-08-06 LAB — CBC
HEMATOCRIT: 38.1 % (ref 36.0–46.0)
HEMOGLOBIN: 12.6 g/dL (ref 12.0–15.0)
MCHC: 32.9 g/dL (ref 30.0–36.0)
MCV: 88.1 fl (ref 78.0–100.0)
PLATELETS: 257 10*3/uL (ref 150.0–400.0)
RBC: 4.33 Mil/uL (ref 3.87–5.11)
RDW: 13.5 % (ref 11.5–15.5)
WBC: 17.2 10*3/uL — AB (ref 4.0–10.5)

## 2018-08-06 NOTE — Addendum Note (Signed)
Addended by: Leanora CoverGUSE, Virgil Lightner on: 08/06/2018 04:51 PM   Modules accepted: Orders

## 2018-08-20 ENCOUNTER — Other Ambulatory Visit: Payer: Self-pay | Admitting: Internal Medicine

## 2018-08-20 DIAGNOSIS — I10 Essential (primary) hypertension: Secondary | ICD-10-CM

## 2018-08-23 ENCOUNTER — Ambulatory Visit: Payer: Medicare Other | Attending: Orthopedic Surgery

## 2018-08-23 DIAGNOSIS — M6281 Muscle weakness (generalized): Secondary | ICD-10-CM | POA: Insufficient documentation

## 2018-08-23 DIAGNOSIS — R262 Difficulty in walking, not elsewhere classified: Secondary | ICD-10-CM

## 2018-08-23 NOTE — Therapy (Signed)
[place] [facility] [address] [place], [address] Phone: [phone]   Fax:  [phone]  Physical Therapy Treatment  Patient Details  Name: Alicia Mccann MRN: [id number] Date of Birth: Dec 17, 1932 Referring Provider (PT): Dale [place] MD   Encounter Date: 08/23/2018  PT End of Session - 08/23/18 1537    Visit Number  4    Number of Visits  13    Date for PT Re-Evaluation  09/01/18    Authorization Type  4/10 G Code    PT Start Time  1515    PT Stop Time  1600    PT Time Calculation (min)  45 min    Activity Tolerance  Patient tolerated treatment well    Behavior During Therapy  [facility] for tasks assessed/performed       Past Medical History:  Diagnosis Date  . Allergic rhinitis   . Anemia   . Anxiety   . Cellulitis   . Cholelithiasis   . Essential hypertension   . History of stress test    a. 12/2008 Ex MV: EF 78%, no ischemia.  . Insomnia   . Osteoarthritis   . Tachycardia   . Valvular heart disease    a. 12/2008 Echo: EF 65%, mild MR;  b. 2/6 SEM RUSB - insignificant murmur, prob Ao Sclerosis.    Past Surgical History:  Procedure Laterality Date  . BACK SURGERY    . BUNIONECTOMY    . CHOLECYSTECTOMY  03/29/2007  . GALLBLADDER SURGERY    . HIP SURGERY     bilateral   . REPLACEMENT TOTAL KNEE Right   . THUMB ARTHROSCOPY      There were no vitals filed for this visit.  Subjective Assessment - 08/23/18 1530    Subjective  Patient reports she has been sick over the holidays and reports she conitnues to feel LE weakness and difficulty ascending/desending the stairs    Pertinent History  TKA right LE 01/04/2018. long history of right knee pain prior to this surgery and has had bilateral THA 2010, 2011. she has had home health therapy and is now referred to out patient therapy     Limitations  Sitting;Standing;Walking;House hold activities    How long can you sit comfortably?  unlimited    How  long can you walk comfortably?  short distances    Patient Stated Goals  to return to prior level of function and be able to take care of her dog, walking dog     Currently in Pain?  No/denies       TREATMENT  Therapeutic Exerscise  Asending/desending stairs - x 10 with slowly lesser UE support as she progresses Forward lunges in standing - x 15 B Side step up onto 8" step - x 15 Sit to stands with weighted ball - 2 x 20 3kg Lateral lunges in standing - x 20  Hops in standing with UE support - 2 x 10  Hip abduction circles with GTB - x 20    Patient demonstrates improvement in stair stepping at end of the session   PT Education - 08/23/18 1537    Education provided  Yes    Education Details  form/technique with exercise    Person(s) Educated  Patient    Methods  Explanation;Demonstration    Comprehension  Verbalized understanding;Returned demonstration          PT Long Term Goals - 07/22/18 0841      PT  LONG TERM GOAL #1   Title  Patient will be independent with HEP to continue benefits of therapy after discharge.    Baseline  Dependent with form/technique    Time  6    Period  Weeks    Status  New    Target Date  09/02/18      PT LONG TERM GOAL #2   Title  Patient will improve 5xSTS score to under 10sec to indicating decreased fall risk and improvement in functional LE strength.     Baseline  13.5sec    Time  6    Period  Weeks    Status  New    Target Date  09/02/17      PT LONG TERM GOAL #3   Title  Patient will be able to ascend/descend the stairs without UE support to better able to perform stairs in community settings.     Baseline  Step to gait pattern with B UE support    Time  6    Period  Weeks    Status  New    Target Date  09/02/17            Plan - 08/23/18 1547    Clinical Impression Statement  Patient demonstrates improvement with ability to perform greater amount of sit to stands compared to the previous session. Although patient is  improving, she continues to demonstrate poor coordination with squatting motions. Patient will benefit from further skilled therapy to return to prior level of function.     Rehab Potential  Good    Clinical Impairments Affecting Rehab Potential  (-)age, multiple co morbidities (+) highly motivated    PT Frequency  2x / week    PT Duration  6 weeks    PT Treatment/Interventions  Cryotherapy;Electrical Stimulation;Moist Heat;Neuromuscular re-education;Patient/family education;Manual techniques;Therapeutic exercise;Balance training;Gait training;Functional mobility training;Therapeutic activities;Iontophoresis 4mg /ml Dexamethasone;Passive range of motion;Dry needling    PT Next Visit Plan  Progress balance and strength    PT Home Exercise Plan  See education section    Consulted and Agree with Plan of Care  Patient       Patient will benefit from skilled therapeutic intervention in order to improve the following deficits and impairments:  Decreased strength, Pain, Decreased activity tolerance, Difficulty walking, Impaired perceived functional ability, Increased muscle spasms, Decreased range of motion, Decreased endurance, Abnormal gait  Visit Diagnosis: Muscle weakness (generalized)  Difficulty in walking, not elsewhere classified     Problem List Patient Active Problem List   Diagnosis Date Noted  . Aortic atherosclerosis (HCC) 05/10/2018  . Coronary artery calcification seen on CAT scan 05/10/2018  . Decreased hearing 05/02/2018  . History of total right knee replacement 01/21/2018  . Chronic constipation   . Noninfectious gastroenteritis   . Lung nodule 01/17/2018  . Anemia 01/14/2018  . Hyperglycemia 01/09/2018  . Colitis 01/09/2018  . Right knee pain 11/28/2017  . Light headedness 10/21/2017  . Healthcare maintenance 06/25/2017  . CKD (chronic kidney disease) stage 3, GFR 30-59 ml/min (HCC) 06/25/2017  . Malaise and fatigue 01/05/2017  . PAD (peripheral artery disease)  (HCC) 05/19/2016  . Valvular heart disease   . Diverticulitis 05/25/2015  . Abdominal pain 05/24/2015  . Allergic rhinitis 04/30/2015  . Absolute anemia 04/30/2015  . Anxiety 04/30/2015  . Carpal tunnel syndrome 04/30/2015  . Chronic LBP 04/30/2015  . Edema extremities 04/30/2015  . Cannot sleep 04/30/2015  . Arthritis sicca 04/30/2015  . Seasonal affective disorder (HCC) 04/30/2015  . Heart  murmur 06/27/2013  . Essential hypertension 06/27/2013  . Osteoarthritis 06/27/2013  . Mixed hyperlipidemia 06/27/2013  . History of repair of hip joint 07/08/2012  . Cholelithiasis without obstruction 03/22/2007    Myrene Galas, PT DPT 08/23/2018, 3:59 PM  [place] [facility] PHYSICAL AND SPORTS MEDICINE [address], [place], [address] Phone: [phone]   Fax:  [phone]  Name: DEZERA BUIE MRN: [id number] Date of Birth: 1933-05-12

## 2018-08-25 ENCOUNTER — Ambulatory Visit: Payer: Medicare Other

## 2018-08-26 ENCOUNTER — Ambulatory Visit
Admission: RE | Admit: 2018-08-26 | Discharge: 2018-08-26 | Disposition: A | Discharge status: Home / Self Care | Payer: Medicare Other | Source: Ambulatory Visit | Attending: Internal Medicine | Admitting: Internal Medicine

## 2018-08-26 DIAGNOSIS — Z1231 Encounter for screening mammogram for malignant neoplasm of breast: Secondary | ICD-10-CM | POA: Diagnosis not present

## 2018-08-26 DIAGNOSIS — Z1239 Encounter for other screening for malignant neoplasm of breast: Secondary | ICD-10-CM

## 2018-08-30 ENCOUNTER — Ambulatory Visit: Payer: Medicare Other

## 2018-09-01 ENCOUNTER — Ambulatory Visit: Payer: Medicare Other

## 2018-09-01 DIAGNOSIS — M6281 Muscle weakness (generalized): Secondary | ICD-10-CM | POA: Diagnosis not present

## 2018-09-01 DIAGNOSIS — R262 Difficulty in walking, not elsewhere classified: Secondary | ICD-10-CM

## 2018-09-01 NOTE — Therapy (Signed)
[facility] [address] [place], [place], [address] Phone: [phone]   Fax:  [phone]  Physical Therapy Treatment  Patient Details  Name: Alicia Mccann MRN: [id number] Date of Birth: [birth year] Referring Provider (PT): Dale Durhamharlene Scott MD   Encounter Date: 09/01/2018  PT End of Session - 09/01/18 1053    Visit Number  5    Number of Visits  13    Date for PT Re-Evaluation  09/01/18    Authorization Type  5/10 G Code (PN [place])    PT Start Time  1000    PT Stop Time  1045    PT Time Calculation (min)  45 min    Activity Tolerance  Patient tolerated treatment well    Behavior During Therapy  [facility] for tasks assessed/performed       Past Medical History:  Diagnosis Date  . Allergic rhinitis   . Anemia   . Anxiety   . Cellulitis   . Cholelithiasis   . Essential hypertension   . History of stress test    a. 12/2008 Ex MV: EF 78%, no ischemia.  . Insomnia   . Osteoarthritis   . Tachycardia   . Valvular heart disease    a. 12/2008 Echo: EF 65%, mild MR;  b. 2/6 SEM RUSB - insignificant murmur, prob Ao Sclerosis.    Past Surgical History:  Procedure Laterality Date  . BACK SURGERY    . BUNIONECTOMY    . CHOLECYSTECTOMY  03/29/2007  . GALLBLADDER SURGERY    . HIP SURGERY     bilateral   . REPLACEMENT TOTAL KNEE Right   . THUMB ARTHROSCOPY      There were no vitals filed for this visit.  Subjective Assessment - 09/01/18 1051    Subjective  Patient reports increased soreness along her low back after the previous treatment session and states she is worried about her low back being injured.     Pertinent History  TKA right LE 01/04/2018. long history of right knee pain prior to this surgery and has had bilateral THA 2010, 2011. she has had home health therapy and is now referred to out patient therapy     Limitations  Sitting;Standing;Walking;House hold activities    How long can you sit comfortably?   unlimited    How long can you walk comfortably?  short distances    Patient Stated Goals  to return to prior level of function and be able to take care of her dog, walking dog     Currently in Pain?  No/denies         TREATMENT  Therapeutic Exerscise  Asending/desending stairs - x 10 with slowly lesser UE support as she progresses Side step up onto 8" step x 2 - 2 x 15 Sit to stands with weighted ball - 2 x 15 3kg Hip abduction in standing with hip machine -- x 25 40# Hip extension in standing with hip machine -- x 25 40#  Pain Science Emotions and pain Patient educated on the relationship between emotions, pain and the role of fear, catastrophization, nociception, and threat play in persistent pain, by activating the body's alarm system, resulting in hypersensitive nerves. Metaphors incorporated to foster deep learning and a paradigm shift for a patien    Patient demonstrates improvement in stair stepping at end of the session    PT Education - 09/01/18 1053    Education provided  Yes    Education  Details  form/technique with exercise    Person(s) Educated  Patient    Methods  Explanation;Demonstration    Comprehension  Verbalized understanding;Returned demonstration          PT Long Term Goals - 07/22/18 0841      PT LONG TERM GOAL #1   Title  Patient will be independent with HEP to continue benefits of therapy after discharge.    Baseline  Dependent with form/technique    Time  6    Period  Weeks    Status  New    Target Date  09/02/18      PT LONG TERM GOAL #2   Title  Patient will improve 5xSTS score to under 10sec to indicating decreased fall risk and improvement in functional LE strength.     Baseline  13.5sec    Time  6    Period  Weeks    Status  New    Target Date  09/02/17      PT LONG TERM GOAL #3   Title  Patient will be able to ascend/descend the stairs without UE support to better able to perform stairs in community settings.     Baseline  Step  to gait pattern with B UE support    Time  6    Period  Weeks    Status  New    Target Date  09/02/17            Plan - 09/01/18 1107    Clinical Impression Statement  Educated patient on pain science secondary to maladaptive thought patterns surrounding her pain. Patient was able to answer superficial questions on pain after the session and decorrelates arthritis with pain. Continues to focus on improving muscular strength with exercises most notably with performing sit to stand movements. Patient demonstrates improvement with stairs as she does not require UE support to perform. However when constructing stairs at a higher grade, patient demonstrates decreased confidence and increased difficulty. Patient will benefit from further skilled therapy to return to prior level of function.     Rehab Potential  Good    Clinical Impairments Affecting Rehab Potential  (-)age, multiple co morbidities (+) highly motivated    PT Frequency  2x / week    PT Duration  6 weeks    PT Treatment/Interventions  Cryotherapy;Electrical Stimulation;Moist Heat;Neuromuscular re-education;Patient/family education;Manual techniques;Therapeutic exercise;Balance training;Gait training;Functional mobility training;Therapeutic activities;Iontophoresis 4mg /ml Dexamethasone;Passive range of motion;Dry needling    PT Next Visit Plan  Progress balance and strength    PT Home Exercise Plan  See education section    Consulted and Agree with Plan of Care  Patient       Patient will benefit from skilled therapeutic intervention in order to improve the following deficits and impairments:  Decreased strength, Pain, Decreased activity tolerance, Difficulty walking, Impaired perceived functional ability, Increased muscle spasms, Decreased range of motion, Decreased endurance, Abnormal gait  Visit Diagnosis: Muscle weakness (generalized)  Difficulty in walking, not elsewhere classified     Problem List Patient Active  Problem List   Diagnosis Date Noted  . Aortic atherosclerosis (HCC) 05/10/2018  . Coronary artery calcification seen on CAT scan 05/10/2018  . Decreased hearing 05/02/2018  . History of total right knee replacement 01/21/2018  . Chronic constipation   . Noninfectious gastroenteritis   . Lung nodule 01/17/2018  . Anemia 01/14/2018  . Hyperglycemia 01/09/2018  . Colitis 01/09/2018  . Right knee pain 11/28/2017  . Light headedness 10/21/2017  . Healthcare maintenance  06/25/2017  . CKD (chronic kidney disease) stage 3, GFR 30-59 ml/min (HCC) 06/25/2017  . Malaise and fatigue 01/05/2017  . PAD (peripheral artery disease) (HCC) 05/19/2016  . Valvular heart disease   . Diverticulitis 05/25/2015  . Abdominal pain 05/24/2015  . Allergic rhinitis 04/30/2015  . Absolute anemia 04/30/2015  . Anxiety 04/30/2015  . Carpal tunnel syndrome 04/30/2015  . Chronic LBP 04/30/2015  . Edema extremities 04/30/2015  . Cannot sleep 04/30/2015  . Arthritis sicca 04/30/2015  . Seasonal affective disorder (HCC) 04/30/2015  . Heart murmur 06/27/2013  . Essential hypertension 06/27/2013  . Osteoarthritis 06/27/2013  . Mixed hyperlipidemia 06/27/2013  . History of repair of hip joint 07/08/2012  . Cholelithiasis without obstruction 03/22/2007    Myrene GalasWesley Briseidy Spark, PT DPT 09/01/2018, 11:16 AM  [place] [facility] [address] [place], [place], [address] Phone: [phone]   Fax:  [phone]  Name: Alicia Mccann MRN: [id number] Date of Birth: Dec 08, 1932

## 2018-09-06 ENCOUNTER — Ambulatory Visit: Payer: Medicare Other

## 2018-09-07 ENCOUNTER — Ambulatory Visit (INDEPENDENT_AMBULATORY_CARE_PROVIDER_SITE_OTHER): Payer: Medicare Other | Admitting: Internal Medicine

## 2018-09-07 ENCOUNTER — Encounter: Payer: Self-pay | Admitting: Internal Medicine

## 2018-09-07 DIAGNOSIS — F419 Anxiety disorder, unspecified: Secondary | ICD-10-CM

## 2018-09-07 DIAGNOSIS — N183 Chronic kidney disease, stage 3 unspecified: Secondary | ICD-10-CM

## 2018-09-07 DIAGNOSIS — I10 Essential (primary) hypertension: Secondary | ICD-10-CM | POA: Diagnosis not present

## 2018-09-07 DIAGNOSIS — F338 Other recurrent depressive disorders: Secondary | ICD-10-CM

## 2018-09-07 DIAGNOSIS — D649 Anemia, unspecified: Secondary | ICD-10-CM

## 2018-09-07 DIAGNOSIS — R911 Solitary pulmonary nodule: Secondary | ICD-10-CM

## 2018-09-07 DIAGNOSIS — H9190 Unspecified hearing loss, unspecified ear: Secondary | ICD-10-CM

## 2018-09-07 DIAGNOSIS — E782 Mixed hyperlipidemia: Secondary | ICD-10-CM | POA: Diagnosis not present

## 2018-09-07 DIAGNOSIS — R739 Hyperglycemia, unspecified: Secondary | ICD-10-CM

## 2018-09-07 DIAGNOSIS — I700 Atherosclerosis of aorta: Secondary | ICD-10-CM

## 2018-09-07 LAB — CBC WITH DIFFERENTIAL/PLATELET
Basophils Absolute: 0.1 10*3/uL (ref 0.0–0.1)
Basophils Relative: 1.3 % (ref 0.0–3.0)
EOS ABS: 0.5 10*3/uL (ref 0.0–0.7)
Eosinophils Relative: 4.5 % (ref 0.0–5.0)
HCT: 39.4 % (ref 36.0–46.0)
Hemoglobin: 13.1 g/dL (ref 12.0–15.0)
Lymphocytes Relative: 21.7 % (ref 12.0–46.0)
Lymphs Abs: 2.2 10*3/uL (ref 0.7–4.0)
MCHC: 33.2 g/dL (ref 30.0–36.0)
MCV: 88.4 fl (ref 78.0–100.0)
Monocytes Absolute: 0.8 10*3/uL (ref 0.1–1.0)
Monocytes Relative: 7.7 % (ref 3.0–12.0)
Neutro Abs: 6.6 10*3/uL (ref 1.4–7.7)
Neutrophils Relative %: 64.8 % (ref 43.0–77.0)
Platelets: 270 10*3/uL (ref 150.0–400.0)
RBC: 4.45 Mil/uL (ref 3.87–5.11)
RDW: 13.8 % (ref 11.5–15.5)
WBC: 10.2 10*3/uL (ref 4.0–10.5)

## 2018-09-07 LAB — BASIC METABOLIC PANEL
BUN: 27 mg/dL — ABNORMAL HIGH (ref 6–23)
CO2: 26 mEq/L (ref 19–32)
Calcium: 10.4 mg/dL (ref 8.4–10.5)
Chloride: 103 mEq/L (ref 96–112)
Creatinine, Ser: 1.03 mg/dL (ref 0.40–1.20)
GFR: 51.14 mL/min — AB (ref >60.00)
Glucose, Bld: 92 mg/dL (ref 70–99)
Potassium: 4.2 mEq/L (ref 3.5–5.1)
SODIUM: 139 meq/L (ref 135–145)

## 2018-09-07 LAB — LIPID PANEL
Cholesterol: 139 mg/dL (ref 0–200)
HDL: 44.4 mg/dL (ref >39.00)
NonHDL: 94.24
Total CHOL/HDL Ratio: 3
Triglycerides: 214 mg/dL — ABNORMAL HIGH (ref 0.0–149.0)
VLDL: 42.8 mg/dL — ABNORMAL HIGH (ref 0.0–40.0)

## 2018-09-07 LAB — HEPATIC FUNCTION PANEL
ALT: 20 U/L (ref 0–35)
AST: 19 U/L (ref 0–37)
Albumin: 4.4 g/dL (ref 3.5–5.2)
Alkaline Phosphatase: 81 U/L (ref 39–117)
Bilirubin, Direct: 0.1 mg/dL (ref 0.0–0.3)
Total Bilirubin: 0.6 mg/dL (ref 0.2–1.2)
Total Protein: 6.7 g/dL (ref 6.0–8.3)

## 2018-09-07 LAB — FERRITIN: Ferritin: 29.8 ng/mL (ref 10.0–291.0)

## 2018-09-07 LAB — IBC PANEL
Iron: 101 ug/dL (ref 42–145)
Saturation Ratios: 26.9 % (ref 20.0–50.0)
Transferrin: 268 mg/dL (ref 212.0–360.0)

## 2018-09-07 LAB — LDL CHOLESTEROL, DIRECT: Direct LDL: 66 mg/dL

## 2018-09-07 LAB — HEMOGLOBIN A1C: Hgb A1c MFr Bld: 6.1 % (ref 4.6–6.5)

## 2018-09-07 NOTE — Progress Notes (Signed)
Patient ID: Alicia Mccann, female   DOB: 1932/11/24, 85 y.o.   MRN: [id number]   Subjective:    Patient ID: Alicia Mccann, female    DOB: March 14, 1933, 85 y.o.   MRN: [id number]  HPI  Patient here for a scheduled physical.  Given concerns today, wanted to postpone physical.  She has been seeing physical therapy for her legs.  Legs are better.  Feels stronger. Having problems with her hands.  Increased pain, etc.  Request referral to therapy for her hands.  Trying to stay active.  No chest pain.  No sob.  No acid reflux.  No abdominal pain.  Bowels moving.  Some increased stress.  Discussed with her today.  She is getting around better.  Still with stress not being able to do the things she used to do.  Seeing ENT for her hearing.  Seeing ophthalmology for her eyes.     Past Medical History:  Diagnosis Date  . Allergic rhinitis   . Anemia   . Anxiety   . Cellulitis   . Cholelithiasis   . Essential hypertension   . History of stress test    a. 12/2008 Ex MV: EF 78%, no ischemia.  . Insomnia   . Osteoarthritis   . Tachycardia   . Valvular heart disease    a. 12/2008 Echo: EF 65%, mild MR;  b. 2/6 SEM RUSB - insignificant murmur, prob Ao Sclerosis.   Past Surgical History:  Procedure Laterality Date  . BACK SURGERY    . BUNIONECTOMY    . CHOLECYSTECTOMY  03/29/2007  . GALLBLADDER SURGERY    . HIP SURGERY     bilateral   . REPLACEMENT TOTAL KNEE Right   . THUMB ARTHROSCOPY     Family History  Problem Relation Age of Onset  . Heart attack Father   . Stroke Father   . Heart disease Father   . CAD Father   . Heart attack Son 44       MI  . Hypertension Son   . Hypertension Mother   . Heart disease Mother   . Stroke Mother   . Breast cancer Neg Hx    Social History   Socioeconomic History  . Marital status: Widowed    Spouse name: Not on file  . Number of children: Not on file  . Years of education: Not on file  . Highest education level: Not on file    Occupational History  . Not on file  Social Needs  . Financial resource strain: Not hard at all  . Food insecurity:    Worry: Never true    Inability: Never true  . Transportation needs:    Medical: No    Non-medical: No  Tobacco Use  . Smoking status: Never Smoker  . Smokeless tobacco: Never Used  Substance and Sexual Activity  . Alcohol use: No  . Drug use: No  . Sexual activity: Not Currently  Lifestyle  . Physical activity:    Days per week: 4 days    Minutes per session: 20 min  . Stress: Not at all  Relationships  . Social connections:    Talks on phone: Not on file    Gets together: Not on file    Attends religious service: Not on file    Active member of club or organization: Not on file    Attends meetings of clubs or organizations: Not on file    Relationship status: Not on file  Other  Topics Concern  . Not on file  Social History Narrative  . Not on file    Outpatient Encounter Medications as of 09/07/2018  Medication Sig  . acetaminophen (TYLENOL) 500 MG tablet Take 1,000 mg by mouth every 8 (eight) hours as needed for mild pain or moderate pain.  Marland Kitchen ALPRAZolam (XANAX) 0.25 MG tablet TAKE 1 TABLET BY MOUTH AT BEDTIME AS NEEDED  . amLODipine (NORVASC) 2.5 MG tablet TAKE ONE TABLET EVERY DAY  . aspirin (ASPIRIN 81) 81 MG EC tablet Take 81 mg by mouth daily. Swallow whole.  Marland Kitchen atorvastatin (LIPITOR) 10 MG tablet Take 1 tablet (10 mg total) by mouth daily.  . cholecalciferol (VITAMIN D) 1000 UNITS tablet Take 1,000 Units by mouth every other day.   . cyanocobalamin ([employer] VITAMIN B12) 2000 MCG tablet Take by mouth.  . triamterene-hydrochlorothiazide (MAXZIDE-25) 37.5-25 MG tablet TAKE ONE TABLET BY MOUTH EVERY DAY   No facility-administered encounter medications on file as of 09/07/2018.     Review of Systems  Constitutional: Negative for appetite change and unexpected weight change.  HENT: Negative for congestion and sinus pain.   Respiratory: Negative for  cough, chest tightness and shortness of breath.   Cardiovascular: Negative for chest pain, palpitations and leg swelling.  Gastrointestinal: Negative for abdominal pain, diarrhea, nausea and vomiting.  Genitourinary: Negative for difficulty urinating and dysuria.  Musculoskeletal: Negative for myalgias.       Hand pain and stiffness as outlined.  Legs better.    Skin: Negative for color change and rash.  Neurological: Negative for dizziness, light-headedness and headaches.  Psychiatric/Behavioral: Negative for agitation and dysphoric mood.       Objective:    Physical Exam Constitutional:      General: She is not in acute distress.    Appearance: Normal appearance.  HENT:     Nose: Nose normal. No congestion.     Mouth/Throat:     Pharynx: No oropharyngeal exudate or posterior oropharyngeal erythema.  Neck:     Musculoskeletal: Neck supple. No muscular tenderness.     Thyroid: No thyromegaly.  Cardiovascular:     Rate and Rhythm: Normal rate and regular rhythm.  Pulmonary:     Effort: No respiratory distress.     Breath sounds: Normal breath sounds. No wheezing.  Abdominal:     General: Bowel sounds are normal.     Palpations: Abdomen is soft.     Tenderness: There is no abdominal tenderness.  Musculoskeletal:        General: No swelling or tenderness.  Lymphadenopathy:     Cervical: No cervical adenopathy.  Skin:    Findings: No erythema or rash.  Neurological:     Mental Status: She is alert.  Psychiatric:        Mood and Affect: Mood normal.        Behavior: Behavior normal.     BP 136/74 (BP Location: Left Arm, Patient Position: Sitting, Cuff Size: Normal)   Pulse 70   Temp 98.1 F (36.7 C) (Oral)   Resp 16   Wt 170 lb 6.4 oz (77.3 kg)   SpO2 98%   BMI 31.17 kg/m  Wt Readings from Last 3 Encounters:  09/07/18 170 lb 6.4 oz (77.3 kg)  08/05/18 166 lb 9.6 oz (75.6 kg)  05/10/18 161 lb 8 oz (73.3 kg)     Lab Results  Component Value Date   WBC 10.2  09/07/2018   HGB 13.1 09/07/2018   HCT 39.4 09/07/2018  PLT 270.0 09/07/2018   GLUCOSE 92 09/07/2018   CHOL 139 09/07/2018   TRIG 214.0 (H) 09/07/2018   HDL 44.40 09/07/2018   LDLDIRECT 66.0 09/07/2018   LDLCALC 40 02/17/2018   ALT 20 09/07/2018   AST 19 09/07/2018   NA 139 09/07/2018   K 4.2 09/07/2018   CL 103 09/07/2018   CREATININE 1.03 09/07/2018   BUN 27 (H) 09/07/2018   CO2 26 09/07/2018   TSH 1.76 10/19/2017   INR 0.9 01/16/2009   HGBA1C 6.1 09/07/2018    Mm 3d Screen Breast Bilateral  Result Date: 08/26/2018 CLINICAL DATA:  Screening. EXAM: DIGITAL SCREENING BILATERAL MAMMOGRAM WITH TOMO AND CAD COMPARISON:  Previous exam(s). ACR Breast Density Category b: There are scattered areas of fibroglandular density. FINDINGS: There are no findings suspicious for malignancy. Images were processed with CAD. IMPRESSION: No mammographic evidence of malignancy. A result letter of this screening mammogram will be mailed directly to the patient. RECOMMENDATION: Screening mammogram in one year. (Code:SM-B-01Y) BI-RADS CATEGORY  1: Negative. Electronically Signed   By: Evangeline Dakin M.D.   On: 08/26/2018 16:49       Assessment & Plan:   Problem List Items Addressed This Visit    Anemia    Off iron.  Follow cbc.       Anxiety    Overall doing better.  Follow.        Aortic atherosclerosis (HCC)    On lipitor.        CKD (chronic kidney disease) stage 3, GFR 30-59 ml/min (HCC)    Follow metabolic panel.        Decreased hearing    Seeing ENT.        Essential hypertension    Blood pressure under good control.  Continue same medication regimen.  Follow pressures.  Follow metabolic panel.        Hyperglycemia    Low carb diet and exercise.  Follow met b and a1c.        Lung nodule    Found on CT in hospital 12/2017.  Recommended f/u in 6-12 months.        Mixed hyperlipidemia    Low cholesterol diet and exercise.  On lipitor.  Follow lipid panel.         Seasonal affective disorder ([place])    Overall doing well.  Follow.            Einar Pheasant, MD

## 2018-09-09 ENCOUNTER — Other Ambulatory Visit: Payer: Self-pay | Admitting: Internal Medicine

## 2018-09-09 ENCOUNTER — Telehealth: Payer: Self-pay

## 2018-09-09 ENCOUNTER — Ambulatory Visit: Payer: Medicare Other

## 2018-09-09 DIAGNOSIS — M79643 Pain in unspecified hand: Secondary | ICD-10-CM

## 2018-09-09 NOTE — Assessment & Plan Note (Signed)
Blood pressure under good control.  Continue same medication regimen.  Follow pressures.  Follow metabolic panel.   

## 2018-09-09 NOTE — Progress Notes (Signed)
Order placed for referral to PT

## 2018-09-09 NOTE — Assessment & Plan Note (Signed)
Overall doing better.  Follow.  

## 2018-09-09 NOTE — Telephone Encounter (Signed)
Order placed for PT referral.  

## 2018-09-09 NOTE — Telephone Encounter (Signed)
Patient has been advised that referrals can take about 7-10 days to be processed and someone will contact her with appt date/time.

## 2018-09-09 NOTE — Telephone Encounter (Signed)
Copied from CRM [id number]. Topic: Referral - Status >> Sep 09, 2018  3:11 PM Baldo Daub L wrote: Reason for CRM:   Pt called because she wants to know status of referral to the hand center at [facility], pt states that she has contacted them and they have nothing on file. Pt can be reached at [phone]

## 2018-09-09 NOTE — Telephone Encounter (Signed)
Copied from CRM [id number]. Topic: General - Other >> Sep 09, 2018  2:59 PM Jaquita Rector A wrote: Reason for CRM: Patient is requesting a call back with lab results please. Ph# [phone]

## 2018-09-09 NOTE — Assessment & Plan Note (Signed)
Off iron.  Follow cbc.

## 2018-09-09 NOTE — Assessment & Plan Note (Signed)
Low carb diet and exercise.  Follow met b and a1c.   

## 2018-09-09 NOTE — Assessment & Plan Note (Signed)
Follow metabolic panel.  

## 2018-09-09 NOTE — Assessment & Plan Note (Signed)
On lipitor

## 2018-09-09 NOTE — Telephone Encounter (Signed)
Copied from CRM [id number]. Topic: General - Other >> Sep 09, 2018  2:59 PM Jaquita Rector A wrote: Reason for CRM: Patient is requesting a call back with lab results please. Ph# [phone]   **See result note.**

## 2018-09-13 ENCOUNTER — Ambulatory Visit: Payer: Medicare Other

## 2018-09-13 ENCOUNTER — Encounter: Payer: Self-pay | Admitting: Internal Medicine

## 2018-09-13 NOTE — Assessment & Plan Note (Signed)
Low cholesterol diet and exercise.  On lipitor.  Follow lipid panel.

## 2018-09-13 NOTE — Assessment & Plan Note (Signed)
Found on CT in hospital 12/2017.  Recommended f/u in 6-12 months.

## 2018-09-13 NOTE — Assessment & Plan Note (Signed)
Seeing ENT

## 2018-09-13 NOTE — Assessment & Plan Note (Signed)
Overall doing well.  Follow.   

## 2018-09-15 ENCOUNTER — Ambulatory Visit: Payer: Medicare Other

## 2018-09-20 ENCOUNTER — Ambulatory Visit: Payer: Medicare Other | Attending: Internal Medicine | Admitting: Occupational Therapy

## 2018-09-20 ENCOUNTER — Other Ambulatory Visit: Payer: Self-pay

## 2018-09-20 ENCOUNTER — Encounter: Payer: Self-pay | Admitting: Occupational Therapy

## 2018-09-20 DIAGNOSIS — M6281 Muscle weakness (generalized): Secondary | ICD-10-CM | POA: Diagnosis present

## 2018-09-20 DIAGNOSIS — M79642 Pain in left hand: Secondary | ICD-10-CM | POA: Insufficient documentation

## 2018-09-20 DIAGNOSIS — M79641 Pain in right hand: Secondary | ICD-10-CM | POA: Diagnosis present

## 2018-09-20 DIAGNOSIS — M25641 Stiffness of right hand, not elsewhere classified: Secondary | ICD-10-CM | POA: Insufficient documentation

## 2018-09-20 DIAGNOSIS — M25642 Stiffness of left hand, not elsewhere classified: Secondary | ICD-10-CM | POA: Diagnosis present

## 2018-09-20 NOTE — Patient Instructions (Signed)
Moist heat AM and PM  Tendon glides  And Opposition - AROM of all - do not force  Joint protection  And [facility] neoprene splint use of thumb pain

## 2018-09-20 NOTE — Therapy (Signed)
[place] [facility] [address] [place], [place], [address] Phone: [phone]   Fax:  [phone]  Occupational Therapy Evaluation  Patient Details  Name: Alicia Mccann MRN: [id number] Date of Birth: [birth year] Referring Provider (OT): scott    Encounter Date: 09/20/2018  OT End of Session - 09/20/18 1741    Visit Number  1    Number of Visits  3    Date for OT Re-Evaluation  10/18/18    OT Start Time  1345    OT Stop Time  1443    OT Time Calculation (min)  58 min    Activity Tolerance  Patient tolerated treatment well    Behavior During Therapy  [facility]       Past Medical History:  Diagnosis Date  . Allergic rhinitis   . Anemia   . Anxiety   . Cellulitis   . Cholelithiasis   . Essential hypertension   . History of stress test    a. 12/2008 Ex MV: EF 78%, no ischemia.  . Insomnia   . Osteoarthritis   . Tachycardia   . Valvular heart disease    a. 12/2008 Echo: EF 65%, mild MR;  b. 2/6 SEM RUSB - insignificant murmur, prob Ao Sclerosis.    Past Surgical History:  Procedure Laterality Date  . BACK SURGERY    . BUNIONECTOMY    . CHOLECYSTECTOMY  03/29/2007  . GALLBLADDER SURGERY    . HIP SURGERY     bilateral   . REPLACEMENT TOTAL KNEE Right   . THUMB ARTHROSCOPY      There were no vitals filed for this visit.  Subjective Assessment - 09/20/18 1655    Subjective   My thumbs had been killing me - I am constantly thinking about my thumbs and pain in my hands - I am always using my hands - never sits down     Patient Stated Goals  Get more strength in my hands ,  not drop things - do not want my hands  to get worse     Currently in Pain?  Yes    Pain Score  6     Pain Location  Hand    Pain Orientation  Right;Left    Pain Descriptors / Indicators  Aching    Pain Type  Chronic pain    Pain Onset  1 to 4 weeks ago    Pain Frequency  Constant    Aggravating Factors   using  my hands         [facility] OT Assessment - 09/20/18 0001      Assessment   Medical Diagnosis  bilateral hand pain     Referring Provider (OT)  scott     Onset Date/Surgical Date  09/13/18    Hand Dominance  Right      Home  Environment   Lives With  Alone      Prior Function   Vocation  Retired    Leisure  walk dog, words on phone,       Strength   Right Hand Grip (lbs)  20    Right Hand Lateral Pinch  6 lbs    Right Hand 3 Point Pinch  5 lbs    Left Hand Grip (lbs)  21    Left Hand Lateral Pinch  6 lbs    Left Hand 3 Point Pinch  6 lbs      Right Hand  AROM   R Thumb MCP 0-60  55 Degrees    R Thumb IP 0-80  45 Degrees    R Thumb Radial ABduction/ADduction 0-55  45    R Thumb Palmar ABduction/ADduction 0-45  35    R Thumb Opposition to Index  --   2nd fold of 5th      Left Hand AROM   L Thumb MCP 0-60  50 Degrees    L Thumb IP 0-80  60 Degrees    L Thumb Radial ADduction/ABduction 0-55  45    L Thumb Palmar ADduction/ABduction 0-45  50    L Thumb Opposition to Index  --   base of 5th        paraffin done to bilateral hands to decrease pain -and fitted with bilateral thumb CMC neoprene splint -to decrease pain - to wear with use of hands  Review HEP - hand out provided  See pt instruction               OT Education - 09/20/18 1702    Education provided  Yes    Education Details  findings of eval and HEP     Person(s) Educated  Patient    Methods  Explanation;Demonstration;Handout    Comprehension  Verbalized understanding;Returned demonstration;Verbal cues required       OT Short Term Goals - 09/20/18 1750      OT SHORT TERM GOAL #1   Title  Pt report decrease pain to less than 4/10 and not constant  in bilateral hands and thumbs with implementing homeprogram     Baseline  pain constant     Time  2    Period  Weeks    Status  New    Target Date  10/04/18        OT Long Term Goals - 09/20/18 1752      OT LONG TERM GOAL #1   Title  Pt to  be ind in HEP to increase grip and prehension in R /L hand by 2-5 lbs , increase/maintain ROM  in digits      Baseline  grip decrease by 50% compare to 2018 in R hand and 12 lbs in L hand - grip now 20 and 21 lbs     Time  4    Period  Weeks    Status  New    Target Date  10/18/18      OT LONG TERM GOAL #2   Title  Pt verbalize  3 joint protection and AE to increase ease of performing ADL's  and IADL;s at home     Baseline  very little knowledge-     Time  4    Period  Weeks    Status  New    Target Date  10/18/18            Plan - 09/20/18 1742    Clinical Impression Statement  Pt present at OT eval with increase pain mostly bilateral thumb CMC's but also in the digits - pt has diagnosis of OA - pt report increase pain constantly - pt was seen in 2018 by this OT - compare to 2018 pt grip decrease about 50% in R hand and 12 lbs in L - decrease thumb and digist AROM in R hand - L hand about the same - pt ed on use of paraffin bath , CMC neoprene splints fitted and joint protection to implement to decrease pain - and AROM - pt can  benefit from OT services to decrease pain and maintain AROM - increase grip strength    Occupational performance deficits (Please refer to evaluation for details):  ADL's;IADL's;Leisure    Rehab Potential  Fair    OT Frequency  Biweekly    OT Duration  4 weeks    OT Treatment/Interventions  Self-care/ADL training;Paraffin;Therapeutic exercise;Patient/family education;Manual Therapy;Splinting    Plan  assess progress with HEP    Clinical Decision Making  Several treatment options, min-mod task modification necessary    OT Home Exercise Plan  see pt instruction    Consulted and Agree with Plan of Care  Patient       Patient will benefit from skilled therapeutic intervention in order to improve the following deficits and impairments:  Impaired UE functional use, Impaired flexibility, Decreased strength, Decreased knowledge of use of DME, Decreased range of  motion, Pain  Visit Diagnosis: Pain in left hand - Plan: Ot plan of care cert/re-cert  Pain in right hand - Plan: Ot plan of care cert/re-cert  Muscle weakness (generalized) - Plan: Ot plan of care cert/re-cert    Problem List Patient Active Problem List   Diagnosis Date Noted  . Aortic atherosclerosis (HCC) 05/10/2018  . Coronary artery calcification seen on CAT scan 05/10/2018  . Decreased hearing 05/02/2018  . History of total right knee replacement 01/21/2018  . Chronic constipation   . Noninfectious gastroenteritis   . Lung nodule 01/17/2018  . Anemia 01/14/2018  . Hyperglycemia 01/09/2018  . Colitis 01/09/2018  . Right knee pain 11/28/2017  . Light headedness 10/21/2017  . Healthcare maintenance 06/25/2017  . CKD (chronic kidney disease) stage 3, GFR 30-59 ml/min (HCC) 06/25/2017  . Malaise and fatigue 01/05/2017  . PAD (peripheral artery disease) (HCC) 05/19/2016  . Valvular heart disease   . Diverticulitis 05/25/2015  . Abdominal pain 05/24/2015  . Allergic rhinitis 04/30/2015  . Absolute anemia 04/30/2015  . Anxiety 04/30/2015  . Carpal tunnel syndrome 04/30/2015  . Chronic LBP 04/30/2015  . Edema extremities 04/30/2015  . Cannot sleep 04/30/2015  . Arthritis sicca 04/30/2015  . Seasonal affective disorder (HCC) 04/30/2015  . Heart murmur 06/27/2013  . Essential hypertension 06/27/2013  . Osteoarthritis 06/27/2013  . Mixed hyperlipidemia 06/27/2013  . History of repair of hip joint 07/08/2012  . Cholelithiasis without obstruction 03/22/2007    Oletta Cohn OTR/l,CLT 09/20/2018, 5:55 PM  [place] [facility] PHYSICAL AND SPORTS MEDICINE [address], [place], [address] Phone: [phone]   Fax:  [phone]  Name: Alicia Mccann MRN: [id number] Date of Birth: [birth year]

## 2018-09-23 ENCOUNTER — Other Ambulatory Visit: Payer: Self-pay

## 2018-09-23 MED ORDER — ATORVASTATIN CALCIUM 10 MG PO TABS: 10.0000 mg | ORAL_TABLET | Freq: Every day | ORAL | 0 refills | Status: DC

## 2018-10-04 ENCOUNTER — Ambulatory Visit: Payer: Medicare Other | Admitting: Occupational Therapy

## 2018-10-04 DIAGNOSIS — M25642 Stiffness of left hand, not elsewhere classified: Secondary | ICD-10-CM

## 2018-10-04 DIAGNOSIS — M79641 Pain in right hand: Secondary | ICD-10-CM

## 2018-10-04 DIAGNOSIS — M79642 Pain in left hand: Secondary | ICD-10-CM

## 2018-10-04 DIAGNOSIS — M25641 Stiffness of right hand, not elsewhere classified: Secondary | ICD-10-CM

## 2018-10-04 DIAGNOSIS — M6281 Muscle weakness (generalized): Secondary | ICD-10-CM

## 2018-10-04 NOTE — Patient Instructions (Signed)
Use her paraffin 3 x day- to decrease pain  And joint protection - cont with CMC neoprene splints

## 2018-10-04 NOTE — Therapy (Signed)
[place] [facility]TS MEDICINE [address], [place], [address] Phone: [phone]   Fax:  [phone]  Occupational Therapy Treatment  Patient Details  Name: Alicia Mccann MRN: [id number] Date of Birth: 1932-08-28 Referring Provider (OT): scott    Encounter Date: 10/04/2018  OT End of Session - 10/04/18 1820    Visit Number  2    Number of Visits  3    Date for OT Re-Evaluation  10/18/18    OT Start Time  1315    OT Stop Time  1357    OT Time Calculation (min)  42 min    Activity Tolerance  Patient tolerated treatment well    Behavior During Therapy  [facility] for tasks assessed/performed       Past Medical History:  Diagnosis Date  . Allergic rhinitis   . Anemia   . Anxiety   . Cellulitis   . Cholelithiasis   . Essential hypertension   . History of stress test    a. 12/2008 Ex MV: EF 78%, no ischemia.  . Insomnia   . Osteoarthritis   . Tachycardia   . Valvular heart disease    a. 12/2008 Echo: EF 65%, mild MR;  b. 2/6 SEM RUSB - insignificant murmur, prob Ao Sclerosis.    Past Surgical History:  Procedure Laterality Date  . BACK SURGERY    . BUNIONECTOMY    . CHOLECYSTECTOMY  03/29/2007  . GALLBLADDER SURGERY    . HIP SURGERY     bilateral   . REPLACEMENT TOTAL KNEE Right   . THUMB ARTHROSCOPY      There were no vitals filed for this visit.  Subjective Assessment - 10/04/18 1812    Subjective   My thumbs had been killing me and my index on R hand - I cleaned , bath the dog - and am just always busy - I don't know if I maybe need to get somebody to clean my house - I did buy and paraffin bath - but did not use it - 3 of my appliances broked     Patient Stated Goals  Get more strength in my hands ,  not drop things - do not want my hands  to get worse     Currently in Pain?  Yes    Pain Score  6     Pain Location  Hand    Pain Orientation  Left    Pain Descriptors / Indicators  Aching    Pain Type   Chronic pain    Pain Onset  More than a month ago    Pain Frequency  Constant         [facility] OT Assessment - 10/04/18 0001      Strength   Right Hand Grip (lbs)  18    Right Hand Lateral Pinch  5 lbs    Right Hand 3 Point Pinch  4 lbs    Left Hand Grip (lbs)  22    Left Hand Lateral Pinch  8 lbs    Left Hand 3 Point Pinch  8 lbs       Assess grip and prehension - see flow sheet Pain increase in R hand this date - CMC of thumb and 2nd digit Decrease grip and prenhension this date in R hand    Discuss in length with pt about modifications and joint protection -ask for help maybe with house work And use of her paraffin bath at  home   did not start using it          OT Treatments/Exercises (OP) - 10/04/18 0001      Ultrasound   Ultrasound Location  R thumb CMC and 2nd MC/PIP    Ultrasound Parameters  3. , 20%, 0.8     Ultrasound Goals  Pain   end of session      RUE Paraffin   Number Minutes Paraffin  10 Minutes    RUE Paraffin Location  Hand    Comments  to decrease pain       LUE Paraffin   Number Minutes Paraffin  10 Minutes    LUE Paraffin Location  Hand    Comments  to decrease pain        pain decrease with Paraffin and with [place]   pt to cont with HEP and then contact me if [place] helped  Soft tissue mobs done to webspace - of bilateral hands        OT Education - 10/04/18 1819    Education provided  Yes    Education Details  HEP  and joint protection    Person(s) Educated  Patient    Methods  Explanation;Demonstration;Handout    Comprehension  Verbalized understanding;Returned demonstration;Verbal cues required       OT Short Term Goals - 10/04/18 1824      OT SHORT TERM GOAL #1   Title  Pt report decrease pain to less than 4/10 and not constant  in bilateral hands and thumbs with implementing homeprogram     Baseline  pain constant  and cont to be 6/10     Time  2    Period  Weeks    Status  On-going    Target Date  10/11/18         OT Long Term Goals - 10/04/18 1824      OT LONG TERM GOAL #1   Title  Pt to be ind in HEP to increase grip and prehension in R /L hand by 2-5 lbs , increase/maintain ROM  in digits      Baseline  grip decrease by 50% compare to 2018 in R hand and 12 lbs in L hand - grip now 20 and 21 lbs - and this date decrease on R to 18 lbs     Time  2    Period  Weeks    Status  On-going    Target Date  10/18/18      OT LONG TERM GOAL #2   Title  Pt verbalize  3 joint protection and AE to increase ease of performing ADL's  and IADL;s at home     Status  Achieved            Plan - 10/04/18 1820    Clinical Impression Statement  Pt was evaluated about 2 wks ago for bilateral increase CMC thumb pain - compare to when seen 2018 pt 's grip decrease in R hand 50% and L 12lbs - this date pt show increase pain -and grip and prehension decrease since 2 wks ago in R hand because of pain - L hand did increase with 1-2 lbs - did talk with pt about other options - like surgery - but that is something that she needs to decide herself when the pain gets to bad- pt can cont with Paraffin , CMC neoprene splints, joint protection - did some [place] this date - pt can call if she feels it helped -  and will repeat     Occupational performance deficits (Please refer to evaluation for details):  ADL's;IADL's;Leisure    Rehab Potential  Fair    OT Frequency  Biweekly    OT Duration  2 weeks    OT Treatment/Interventions  Self-care/ADL training;Paraffin;Therapeutic exercise;Patient/family education;Manual Therapy;Splinting    Plan  Pt to call if want to be assess and repeat US     Clinical Decision Making  Several treatment options, min-mod task modification necessary    OT Home Exercise Plan  see pt instruction    Consulted and Agree with Plan of Care  Patient       Patient will benefit from skilled therapeutic intervention in order to improve the following deficits and impairments:  Impaired UE functional use,  Impaired flexibility, Decreased strength, Decreased knowledge of use of DME, Decreased range of motion, Pain  Visit Diagnosis: Pain in left hand  Pain in right hand  Muscle weakness (generalized)  Stiffness of right hand, not elsewhere classified  Stiffness of left hand, not elsewhere classified    Problem List Patient Active Problem List   Diagnosis Date Noted  . Aortic atherosclerosis (HCC) 05/10/2018  . Coronary artery calcification seen on CAT scan 05/10/2018  . Decreased hearing 05/02/2018  . History of total right knee replacement 01/21/2018  . Chronic constipation   . Noninfectious gastroenteritis   . Lung nodule 01/17/2018  . Anemia 01/14/2018  . Hyperglycemia 01/09/2018  . Colitis 01/09/2018  . Right knee pain 11/28/2017  . Light headedness 10/21/2017  . Healthcare maintenance 06/25/2017  . CKD (chronic kidney disease) stage 3, GFR 30-59 ml/min (HCC) 06/25/2017  . Malaise and fatigue 01/05/2017  . PAD (peripheral artery disease) (HCC) 05/19/2016  . Valvular heart disease   . Diverticulitis 05/25/2015  . Abdominal pain 05/24/2015  . Allergic rhinitis 04/30/2015  . Absolute anemia 04/30/2015  . Anxiety 04/30/2015  . Carpal tunnel syndrome 04/30/2015  . Chronic LBP 04/30/2015  . Edema extremities 04/30/2015  . Cannot sleep 04/30/2015  . Arthritis sicca 04/30/2015  . Seasonal affective disorder (HCC) 04/30/2015  . Heart murmur 06/27/2013  . Essential hypertension 06/27/2013  . Osteoarthritis 06/27/2013  . Mixed hyperlipidemia 06/27/2013  . History of repair of hip joint 07/08/2012  . Cholelithiasis without obstruction 03/22/2007    Oletta CohnuPreez, Darrell Hauk OTR/L,CLT 10/04/2018, 6:34 PM  [facility] [address] [place], [place], [address] Phone: [phone]   Fax:  [phone]  Name: Alicia Mccann MRN: [id number] Date of Birth: 12-Feb-1933

## 2018-10-28 ENCOUNTER — Ambulatory Visit: Payer: Self-pay

## 2018-10-28 NOTE — Telephone Encounter (Signed)
Incoming call from Patient.  Who reports having is currently going through an episodef diverticulitis  Does not want to come in off related to the issues wanted to know if something could be called in. last Bm was  2 hours ago.  . States this morning Patient had to strain to have a BM.  Denies pain coming out.   Composition was hard.  Denies blood in stool.   States she hasnt been eating  Correctly.   Denies laxatives.   Denies abdominal pain.  Provided care advice. Patient voided understanding.  Encouraged Patient to call back if Sx worsen .  Voiced understanding.   Reason for Disposition . Mild constipation  Answer Assessment - Initial Assessment Questions 1. STOOL PATTERN OR FREQUENCY: "How often do you pass bowel movements (BMs)?"  (Normal range: tid to q 3 days)  "When was the last BM passed?"       2 hours ago 2. STRAINING: "Do you have to strain to have a BM?"      No this am 3. RECTAL PAIN: "Does your rectum hurt when the stool comes out?" If so, ask: "Do you have hemorrhoids? How bad is the pain?"  (Scale 1-10; or mild, moderate, severe)     Denies pain whe coming out mild 4. STOOL COMPOSITION: "Are the stools hard?"     hard 5. BLOOD ON STOOLS: "Has there been any blood on the toilet tissue or on the surface of the BM?" If so, ask: "When was the last time?"      denies 6. CHRONIC CONSTIPATION: "Is this a new problem for you?"  If no, ask: "How long have you had this problem?" (days, weeks, months)      no 7. CHANGES IN DIET: "Have there been any recent changes in your diet?"      States hasnt been eating correctly 8. MEDICATIONS: "Have you been taking any new medications?"     denies 9. LAXATIVES: "Have you been using any laxatives or enemas?"  If yes, ask "What, how often, and when was the last time?"     denies 10. CAUSE: "What do you think is causing the constipation?"        *No Answer* 11. OTHER SYMPTOMS: "Do you have any other symptoms?" (e.g., abdominal pain, fever,  vomiting)       denies 12. PREGNANCY: "Is there any chance you are pregnant?" "When was your last menstrual period?"       *No Answer*  Protocols used: CONSTIPATION-A-AH

## 2018-10-28 NOTE — Telephone Encounter (Signed)
Call placed to patient. Left VM to return call to office 

## 2018-12-13 ENCOUNTER — Other Ambulatory Visit: Payer: Self-pay | Admitting: Cardiovascular Disease

## 2018-12-15 ENCOUNTER — Telehealth: Payer: Self-pay | Admitting: Internal Medicine

## 2018-12-15 NOTE — Telephone Encounter (Signed)
-----   Message from Larry Sierras, LPN sent at 8/36/6294  9:23 AM EDT ----- Regarding: RE: f/u CT chest Patient has been scheduled for a follow up with you on 5/5 to discuss this further. She says she vaguely remembers this and would like to talk with you about it. Pt would also like to discuss starting b12 injections. She is currently taking b12 100 mcg every other day.  ----- Message ----- From: Dale [place], MD Sent: 10/31/2018  10:16 AM EDT To: Larry Sierras, LPN Subject: f/u CT chest                                   Ms Vanderwood was seen previously in ER for abdominal pain, etc.  CT scan performed.  Incidental finding - small lung nodule.  I had discussed this with her at the time.  Needs a f/u CT scan (6/12 months).  We are in the middle of this time frame and I would like to get CT scheduled.  Please call pt and see if she would be agreeable to schedule.  If does not want to now, I can order at her f/u appt with me.  See me before calling.    Dr Lorin Picket

## 2018-12-15 NOTE — Telephone Encounter (Unsigned)
Copied from CRM [id number]. Topic: General - Other >> Dec 15, 2018  8:57 AM Elliot Gault wrote:  Relation to pt: self  Call back number: [phone]  Pharmacy: [facility] - [place], [place] - [address] [phone] ([phone] (Fax)  Reason for call:  Patient requesting B12 injection and would like her pharmacist to administer, pharmacist advised patient to contact PCP and request prescription, please advise

## 2018-12-15 NOTE — Telephone Encounter (Signed)
Call handled by Bethann Berkshire and Dr. Lorin Picket.  Nina,cma

## 2018-12-17 ENCOUNTER — Encounter: Payer: Medicare Other | Admitting: Internal Medicine

## 2018-12-21 ENCOUNTER — Ambulatory Visit (INDEPENDENT_AMBULATORY_CARE_PROVIDER_SITE_OTHER): Payer: Medicare Other | Admitting: Internal Medicine

## 2018-12-21 ENCOUNTER — Other Ambulatory Visit: Payer: Self-pay

## 2018-12-21 ENCOUNTER — Encounter: Payer: Self-pay | Admitting: Internal Medicine

## 2018-12-21 DIAGNOSIS — D649 Anemia, unspecified: Secondary | ICD-10-CM

## 2018-12-21 DIAGNOSIS — R5381 Other malaise: Secondary | ICD-10-CM

## 2018-12-21 DIAGNOSIS — R5383 Other fatigue: Secondary | ICD-10-CM

## 2018-12-21 DIAGNOSIS — I739 Peripheral vascular disease, unspecified: Secondary | ICD-10-CM

## 2018-12-21 DIAGNOSIS — R918 Other nonspecific abnormal finding of lung field: Secondary | ICD-10-CM | POA: Diagnosis not present

## 2018-12-21 DIAGNOSIS — I10 Essential (primary) hypertension: Secondary | ICD-10-CM

## 2018-12-21 DIAGNOSIS — R911 Solitary pulmonary nodule: Secondary | ICD-10-CM

## 2018-12-21 DIAGNOSIS — R739 Hyperglycemia, unspecified: Secondary | ICD-10-CM

## 2018-12-21 DIAGNOSIS — E782 Mixed hyperlipidemia: Secondary | ICD-10-CM

## 2018-12-21 DIAGNOSIS — F419 Anxiety disorder, unspecified: Secondary | ICD-10-CM | POA: Diagnosis not present

## 2018-12-21 DIAGNOSIS — H9190 Unspecified hearing loss, unspecified ear: Secondary | ICD-10-CM

## 2018-12-21 DIAGNOSIS — I700 Atherosclerosis of aorta: Secondary | ICD-10-CM

## 2018-12-21 DIAGNOSIS — N183 Chronic kidney disease, stage 3 unspecified: Secondary | ICD-10-CM

## 2018-12-21 MED ORDER — ALPRAZOLAM 0.25 MG PO TABS: 0.2500 mg | ORAL_TABLET | Freq: Every evening | ORAL | 0 refills | Status: DC | PRN

## 2018-12-21 NOTE — Progress Notes (Signed)
Patient ID: Alicia Mccann, female   DOB: 1932-10-24, 85 y.o.   MRN: [id number]   Virtual Visit via Telephone Note  This visit type was conducted due to national recommendations for restrictions regarding the COVID-19 pandemic (e.g. social distancing).  This format is felt to be most appropriate for this patient at this time.  All issues noted in this document were discussed and addressed.  No physical exam was performed (except for noted visual exam findings with Video Visits).   I connected with Alicia Mccann by telephone and verified that I am speaking with the correct person using two identifiers. Location patient: home Location provider: work  Persons participating in the telephone visit: patient, provider  I discussed the limitations, risks, security and privacy concerns of performing an evaluation and management service by telephone and the availability of in person appointments.  The patient expressed understanding and agreed to proceed.   Reason for visit: scheduled follow up.   HPI: She reports she is doing relatively well.  Has a history of anemia.  Off iron.  Last hgb 08/2018 - 13.1.  Does report some increased fatigue.  She feels this is related to the increased stress with COVID, the restrictions, etc.  She is trying to stay in.  No increased cough or congestion.  No sob.  No fever.  No chest pain.  No acid reflux.  No abdominal pain.  Bowels moving.  Discussed again regarding the CT chest and f/u lung nodule.  She is agreeable to f/u chest CT.  Has hearing aids.  Tolerating.     ROS: See pertinent positives and negatives per HPI.  Past Medical History:  Diagnosis Date  . Allergic rhinitis   . Anemia   . Anxiety   . Cellulitis   . Cholelithiasis   . Essential hypertension   . History of stress test    a. 12/2008 Ex MV: EF 78%, no ischemia.  . Insomnia   . Osteoarthritis   . Tachycardia   . Valvular heart disease    a. 12/2008 Echo: EF 65%, mild MR;  b. 2/6 SEM RUSB  - insignificant murmur, prob Ao Sclerosis.    Past Surgical History:  Procedure Laterality Date  . BACK SURGERY    . BUNIONECTOMY    . CHOLECYSTECTOMY  03/29/2007  . GALLBLADDER SURGERY    . HIP SURGERY     bilateral   . REPLACEMENT TOTAL KNEE Right   . THUMB ARTHROSCOPY      Family History  Problem Relation Age of Onset  . Heart attack Father   . Stroke Father   . Heart disease Father   . CAD Father   . Heart attack Son 39       MI  . Hypertension Son   . Hypertension Mother   . Heart disease Mother   . Stroke Mother   . Breast cancer Neg Hx     SOCIAL HX: reviewed.    Current Outpatient Medications:  .  acetaminophen (TYLENOL) 500 MG tablet, Take 1,000 mg by mouth every 8 (eight) hours as needed for mild pain or moderate pain., Disp: , Rfl:  .  ALPRAZolam (XANAX) 0.25 MG tablet, Take 1 tablet (0.25 mg total) by mouth at bedtime as needed., Disp: 30 tablet, Rfl: 0 .  amLODipine (NORVASC) 2.5 MG tablet, TAKE ONE TABLET EVERY DAY, Disp: 90 tablet, Rfl: 1 .  aspirin (ASPIRIN 81) 81 MG EC tablet, Take 81 mg by mouth daily. Swallow whole., Disp: , Rfl:  .  atorvastatin (LIPITOR) 10 MG tablet, TAKE 1 TABLET BY MOUTH DAILY, Disp: 90 tablet, Rfl: 0 .  cholecalciferol (VITAMIN D) 1000 UNITS tablet, Take 1,000 Units by mouth every other day. , Disp: , Rfl:  .  cyanocobalamin 100 MCG tablet, Take 100 mcg by mouth daily., Disp: , Rfl:  .  triamterene-hydrochlorothiazide (MAXZIDE-25) 37.5-25 MG tablet, TAKE ONE TABLET BY MOUTH EVERY DAY, Disp: 30 tablet, Rfl: 5  EXAM:  GENERAL: alert.  Answering questions appropriately.  Sounds to be in no acute distress.    PSYCH/NEURO: pleasant and cooperative, no obvious depression or anxiety, speech and thought processing grossly intact  ASSESSMENT AND PLAN:  Discussed the following assessment and plan:  Anxiety - Plan: ALPRAZolam (XANAX) 0.25 MG tablet  Abnormal findings on diagnostic imaging of lung - Plan: CT Chest Wo Contrast   Anemia, unspecified type  Aortic atherosclerosis (HCC)  CKD (chronic kidney disease) stage 3, GFR 30-59 ml/min (HCC)  Decreased hearing, unspecified laterality  Essential hypertension  Hyperglycemia  Lung nodule  Malaise and fatigue  Mixed hyperlipidemia  PAD (peripheral artery disease) (HCC)  Anemia Off iron.  Follow cbc and ferritin.    Anxiety Increased anxiety as outlined.  Discussed with her today.  She does not feel needs anything more at this time.  Follow.    Aortic atherosclerosis (HCC) On lipitor.    CKD (chronic kidney disease) stage 3, GFR 30-59 ml/min (HCC) Avoid antiinflammatories.  Follow metabolic panel.   Decreased hearing Wearing hearing aids now.    Essential hypertension Blood pressure has been under good control.  Follow pressures.  Follow metabolic panel.   Hyperglycemia Low carb diet and exercise.  Follow met b and a1c.   Lung nodule Found on CT 12/2017.  Discussed again with her regarding f/u CT chest.  She is in agreement.    Malaise and fatigue Reports increased fatigue.  Discussed with her today.  She feels is related to the increased stress she has experienced from the corona virus.    Mixed hyperlipidemia On lipitor.  Low cholesterol diet and exercise.  Follow lipid panel and liver function tests.    PAD (peripheral artery disease) ([place]) Continue crestor and aspirin.      I discussed the assessment and treatment plan with the patient. The patient was provided an opportunity to ask questions and all were answered. The patient agreed with the plan and demonstrated an understanding of the instructions.   The patient was advised to call back or seek an in-person evaluation if the symptoms worsen or if the condition fails to improve as anticipated.  I provided 25 minutes of non-face-to-face time during this encounter.   Einar Pheasant, MD

## 2018-12-25 ENCOUNTER — Encounter: Payer: Self-pay | Admitting: Internal Medicine

## 2018-12-25 NOTE — Assessment & Plan Note (Signed)
Continue crestor and aspirin.  

## 2018-12-25 NOTE — Assessment & Plan Note (Signed)
Blood pressure has been under good control.  Follow pressures.  Follow metabolic panel.  

## 2018-12-25 NOTE — Assessment & Plan Note (Signed)
Found on CT 12/2017.  Discussed again with her regarding f/u CT chest.  She is in agreement.

## 2018-12-25 NOTE — Assessment & Plan Note (Signed)
On lipitor

## 2018-12-25 NOTE — Assessment & Plan Note (Signed)
Wearing hearing aids now.

## 2018-12-25 NOTE — Assessment & Plan Note (Signed)
Low carb diet and exercise.  Follow met b and a1c.  

## 2018-12-25 NOTE — Assessment & Plan Note (Signed)
Off iron.  Follow cbc and ferritin.

## 2018-12-25 NOTE — Assessment & Plan Note (Signed)
On lipitor.  Low cholesterol diet and exercise.  Follow lipid panel and liver function tests.   

## 2018-12-25 NOTE — Assessment & Plan Note (Signed)
Increased anxiety as outlined.  Discussed with her today.  She does not feel needs anything more at this time.  Follow.

## 2018-12-25 NOTE — Assessment & Plan Note (Signed)
Avoid antiinflammatories.  Follow metabolic panel.  

## 2018-12-25 NOTE — Assessment & Plan Note (Signed)
Reports increased fatigue.  Discussed with her today.  She feels is related to the increased stress she has experienced from the corona virus.

## 2019-01-12 ENCOUNTER — Ambulatory Visit: Payer: Medicare Other

## 2019-01-21 IMAGING — CR DG CHEST 2V
2 series · 2 of 2 positions shown · non-contrast
Comparison: 02/06/2011

CLINICAL DATA: Right knee replacement 1 week ago. Since the
surgery, patient states she can't walk, food does not taste the
stain, bowel movements are black, and vomiting. Shortness of breath.

EXAM:
CHEST - 2 VIEW

[chest lat]
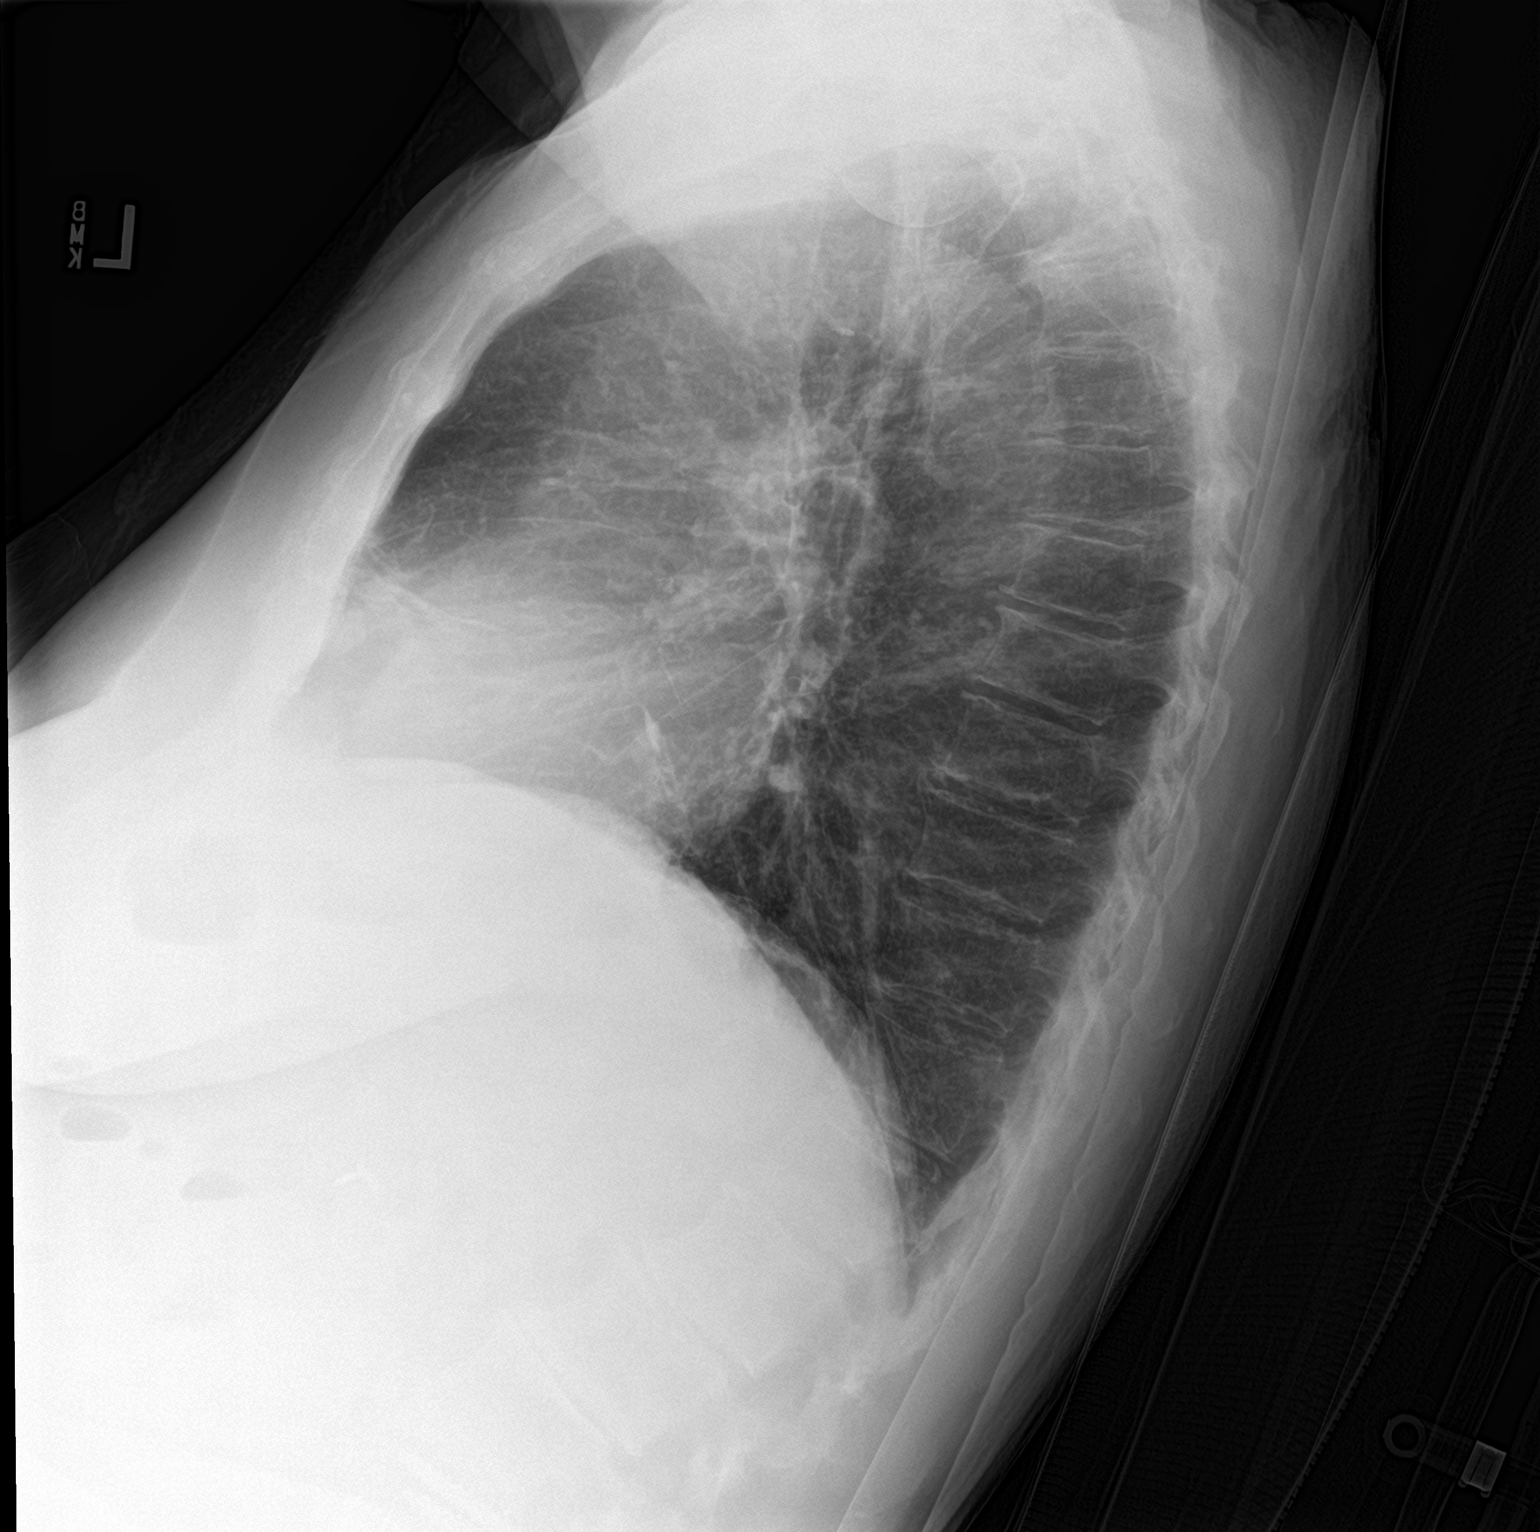

[chest ap]
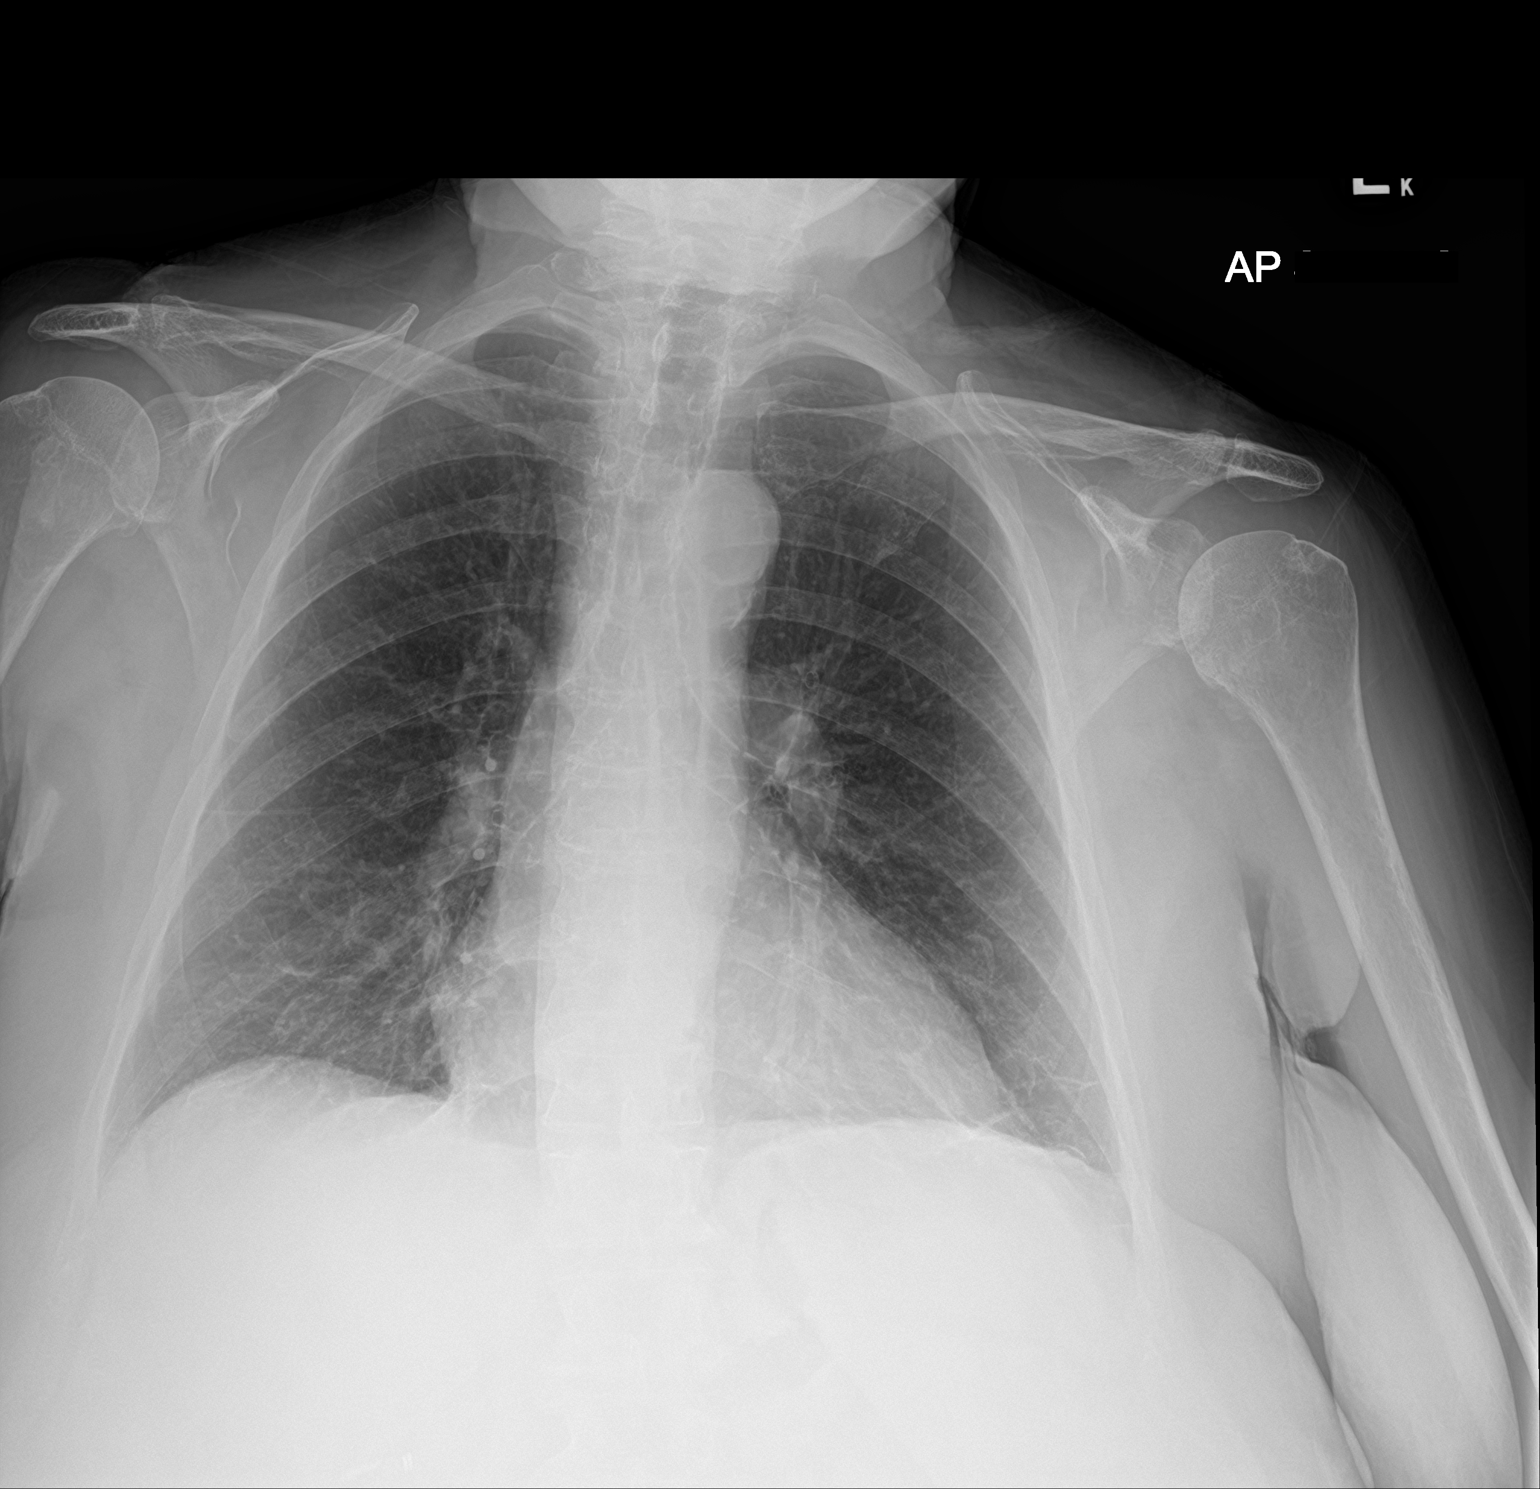

[2 of 2 positions shown; findings below may reference images not displayed]

FINDINGS: Heart size and pulmonary vascularity are normal. Slight linear
fibrosis or atelectasis in the lung bases. No consolidation or
airspace disease. No blunting of costophrenic angles. No
pneumothorax. Mediastinal contours appear intact. Calcification of
the aorta. Degenerative changes in the spine.
IMPRESSION: Linear atelectasis or fibrosis in the lung bases. No active
consolidation.

## 2019-01-23 IMAGING — CR DG ABDOMEN 1V
1 series · 1 of 1 positions shown · non-contrast
Comparison: Body CT 01/09/2018

CLINICAL DATA: Constipation since 01/04/2018

EXAM:
ABDOMEN - 1 VIEW

[dg abd 1 view]
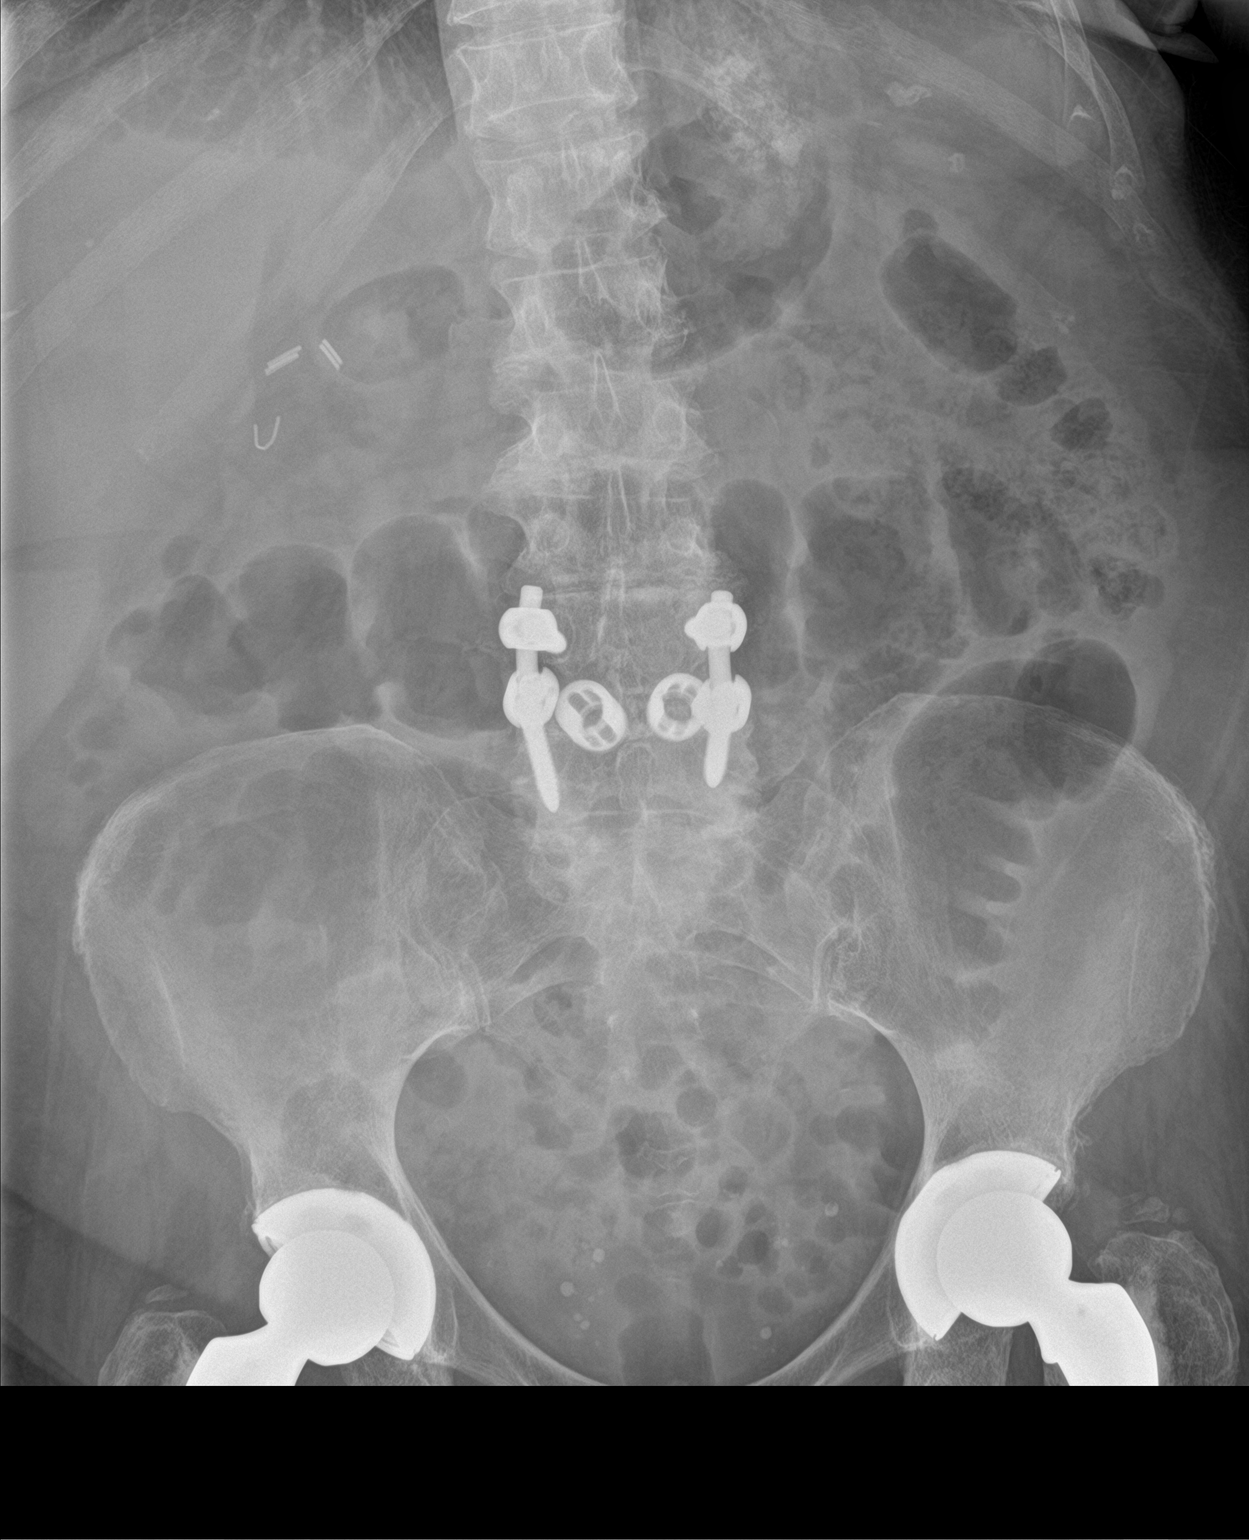

[1 of 1 positions shown; findings below may reference images not displayed]

FINDINGS: The bowel gas pattern is normal. Moderate stool burden. Nonspecific
gaseous dilation of the transverse and proximal descending colon. No
radio-opaque calculi or other significant radiographic abnormality
are seen.

Postsurgical changes from cholecystectomy, lower lumbosacral spine
fusion and bilateral hip arthroplasty.
IMPRESSION: Nonobstructive bowel gas pattern.

## 2019-01-27 ENCOUNTER — Telehealth: Payer: Self-pay

## 2019-01-27 NOTE — Telephone Encounter (Signed)
Copied from [place] [id number]. Topic: General - Other >> Jan 27, 2019  8:45 AM Ivar Drape wrote: Reason for CRM: Patient would like a call back from [place] concerning her Diverticulitis

## 2019-01-28 NOTE — Telephone Encounter (Signed)
Spoke with patient to confirm no new acute issues. Pt confirmed that she thinks she has diverticulitis because she has had some dull belly pain every now and then and some loose stools. Patient stated that this has been going on for about a month and would just like to discuss with Dr. Nicki Reaper. Scheduled virtual on Tuesday. Advised if symptoms become persistent or worsen she should be evaluated sooner. Pt agreed

## 2019-02-01 ENCOUNTER — Ambulatory Visit (INDEPENDENT_AMBULATORY_CARE_PROVIDER_SITE_OTHER): Payer: Medicare Other | Admitting: Internal Medicine

## 2019-02-01 ENCOUNTER — Other Ambulatory Visit: Payer: Self-pay

## 2019-02-01 DIAGNOSIS — I10 Essential (primary) hypertension: Secondary | ICD-10-CM

## 2019-02-01 DIAGNOSIS — N183 Chronic kidney disease, stage 3 unspecified: Secondary | ICD-10-CM

## 2019-02-01 DIAGNOSIS — R1032 Left lower quadrant pain: Secondary | ICD-10-CM | POA: Diagnosis not present

## 2019-02-01 DIAGNOSIS — R911 Solitary pulmonary nodule: Secondary | ICD-10-CM

## 2019-02-01 DIAGNOSIS — F419 Anxiety disorder, unspecified: Secondary | ICD-10-CM

## 2019-02-01 DIAGNOSIS — I700 Atherosclerosis of aorta: Secondary | ICD-10-CM | POA: Diagnosis not present

## 2019-02-01 DIAGNOSIS — D649 Anemia, unspecified: Secondary | ICD-10-CM

## 2019-02-01 NOTE — Progress Notes (Signed)
Patient ID: Alicia Mccann, female   DOB: 1933/01/07, 85 y.o.   MRN: [id number]   Virtual Visit via telephone Note  This visit type was conducted due to national recommendations for restrictions regarding the COVID-19 pandemic (e.g. social distancing).  This format is felt to be most appropriate for this patient at this time.  All issues noted in this document were discussed and addressed.  No physical exam was performed (except for noted visual exam findings with Video Visits).   I connected with Alicia Mccann by telephone and verified that I am speaking with the correct person using two identifiers. Location patient: home Location provider: work  Persons participating in the telephone visit: patient, provider  I discussed the limitations, risks, security and privacy concerns of performing an evaluation and management service by telephone and the availability of in person appointments.  The patient expressed understanding and agreed to proceed.   Reason for visit: acute visit   HPI: Worked in for abdominal pain.  States she at [employer].  Noticed some lower abdominal pain.  She reports history of diverticulitis.  States pain felt similar to her previous diverticular flares.  She reports pain has resolved now.  No abdominal pain. Eating.  No nausea or vomiting.  Bowels moving.  No urine change.  Discussed previous CT scan and admission.  Occurred one year ago. CT with wall thickening that was felt to be c/w colitis.  She had had no problems since that hospitalization, until now.  Symptoms resolved.  Discussed further evaluation.  She declines.  Getting new hearing aids.  Some increased stress.  Discussed with her today.  She does not feel she needs any further intervention. Wants to monitor.  Sleeping better.     ROS: See pertinent positives and negatives per HPI.  Past Medical History:  Diagnosis Date  . Allergic rhinitis   . Anemia   . Anxiety   . Cellulitis   . Cholelithiasis   .  Essential hypertension   . History of stress test    a. 12/2008 Ex MV: EF 78%, no ischemia.  . Insomnia   . Osteoarthritis   . Tachycardia   . Valvular heart disease    a. 12/2008 Echo: EF 65%, mild MR;  b. 2/6 SEM RUSB - insignificant murmur, prob Ao Sclerosis.    Past Surgical History:  Procedure Laterality Date  . BACK SURGERY    . BUNIONECTOMY    . CHOLECYSTECTOMY  03/29/2007  . GALLBLADDER SURGERY    . HIP SURGERY     bilateral   . REPLACEMENT TOTAL KNEE Right   . THUMB ARTHROSCOPY      Family History  Problem Relation Age of Onset  . Heart attack Father   . Stroke Father   . Heart disease Father   . CAD Father   . Heart attack Son 8       MI  . Hypertension Son   . Hypertension Mother   . Heart disease Mother   . Stroke Mother   . Breast cancer Neg Hx     SOCIAL HX: reviewed.    Current Outpatient Medications:  .  acetaminophen (TYLENOL) 500 MG tablet, Take 1,000 mg by mouth every 8 (eight) hours as needed for mild pain or moderate pain., Disp: , Rfl:  .  ALPRAZolam (XANAX) 0.25 MG tablet, Take 1 tablet (0.25 mg total) by mouth at bedtime as needed., Disp: 30 tablet, Rfl: 0 .  amLODipine (NORVASC) 2.5 MG tablet, TAKE ONE TABLET  EVERY DAY, Disp: 90 tablet, Rfl: 1 .  aspirin (ASPIRIN 81) 81 MG EC tablet, Take 81 mg by mouth daily. Swallow whole., Disp: , Rfl:  .  atorvastatin (LIPITOR) 10 MG tablet, TAKE 1 TABLET BY MOUTH DAILY, Disp: 90 tablet, Rfl: 0 .  cholecalciferol (VITAMIN D) 1000 UNITS tablet, Take 1,000 Units by mouth every other day. , Disp: , Rfl:  .  cyanocobalamin 100 MCG tablet, Take 100 mcg by mouth daily., Disp: , Rfl:  .  triamterene-hydrochlorothiazide (MAXZIDE-25) 37.5-25 MG tablet, TAKE ONE TABLET BY MOUTH EVERY DAY, Disp: 30 tablet, Rfl: 5  EXAM:  GENERAL: alert.  Sounds to be in no acute distress.  Answering questions appropriately.    PSYCH/NEURO: pleasant and cooperative, no obvious depression or anxiety, speech and thought processing  grossly intact  ASSESSMENT AND PLAN:  Discussed the following assessment and plan:  Abdominal pain Resolved now.  See note. Discussed previous CT. First flare since her hospitalization since last year.  Desires to follow.    Anemia Follow cbc and ferritin.    Anxiety Discussed with her today.  Overall she feels she is controlling things relatively well. Does not feel needs anything more at this time.  Follow.    Aortic atherosclerosis (HCC) On lipitor.    CKD (chronic kidney disease) stage 3, GFR 30-59 ml/min (HCC) Follow metabolic panel.   Essential hypertension Blood pressure has been doing well.  Follow pressures.  Follow metabolic panel.   Lung nodule Scheduled for f/u chest CT 02/11/19.      I discussed the assessment and treatment plan with the patient. The patient was provided an opportunity to ask questions and all were answered. The patient agreed with the plan and demonstrated an understanding of the instructions.   The patient was advised to call back or seek an in-person evaluation if the symptoms worsen or if the condition fails to improve as anticipated.  I provided 20 minutes of non-face-to-face time during this encounter.   Dale Durhamharlene Arrabella Westerman, MD

## 2019-02-02 ENCOUNTER — Encounter: Payer: Self-pay | Admitting: Internal Medicine

## 2019-02-02 NOTE — Assessment & Plan Note (Signed)
Follow metabolic panel.  

## 2019-02-02 NOTE — Assessment & Plan Note (Signed)
Scheduled for f/u chest CT 02/11/19.

## 2019-02-02 NOTE — Assessment & Plan Note (Signed)
Discussed with her today.  Overall she feels she is controlling things relatively well. Does not feel needs anything more at this time.  Follow.

## 2019-02-02 NOTE — Assessment & Plan Note (Signed)
On lipitor

## 2019-02-02 NOTE — Assessment & Plan Note (Signed)
Resolved now.  See note. Discussed previous CT. First flare since her hospitalization since last year.  Desires to follow.

## 2019-02-02 NOTE — Assessment & Plan Note (Signed)
Follow cbc and ferritin.  

## 2019-02-02 NOTE — Assessment & Plan Note (Signed)
Blood pressure has been doing well.  Follow pressures.  Follow metabolic panel.   

## 2019-02-11 ENCOUNTER — Ambulatory Visit: Payer: Medicare Other

## 2019-02-14 ENCOUNTER — Other Ambulatory Visit: Payer: Self-pay | Admitting: Internal Medicine

## 2019-02-14 DIAGNOSIS — I10 Essential (primary) hypertension: Secondary | ICD-10-CM

## 2019-02-17 ENCOUNTER — Ambulatory Visit: Payer: Medicare Other

## 2019-03-17 ENCOUNTER — Ambulatory Visit: Payer: Medicare Other | Admitting: Internal Medicine

## 2019-03-17 ENCOUNTER — Ambulatory Visit: Payer: Medicare Other

## 2019-03-18 ENCOUNTER — Other Ambulatory Visit: Payer: Self-pay

## 2019-03-18 ENCOUNTER — Ambulatory Visit
Admission: RE | Admit: 2019-03-18 | Discharge: 2019-03-18 | Disposition: A | Discharge status: Home / Self Care | Payer: Medicare Other | Source: Ambulatory Visit | Attending: Internal Medicine | Admitting: Internal Medicine

## 2019-03-18 DIAGNOSIS — R918 Other nonspecific abnormal finding of lung field: Secondary | ICD-10-CM | POA: Diagnosis present

## 2019-03-21 ENCOUNTER — Other Ambulatory Visit: Payer: Self-pay | Admitting: Internal Medicine

## 2019-03-21 ENCOUNTER — Ambulatory Visit: Payer: Medicare Other

## 2019-03-21 DIAGNOSIS — E782 Mixed hyperlipidemia: Secondary | ICD-10-CM

## 2019-03-21 DIAGNOSIS — I10 Essential (primary) hypertension: Secondary | ICD-10-CM

## 2019-03-21 DIAGNOSIS — R739 Hyperglycemia, unspecified: Secondary | ICD-10-CM

## 2019-03-21 NOTE — Progress Notes (Signed)
Orders placed for f/u labs.  

## 2019-03-28 ENCOUNTER — Ambulatory Visit: Payer: Medicare Other

## 2019-03-30 ENCOUNTER — Other Ambulatory Visit: Payer: Self-pay

## 2019-03-30 ENCOUNTER — Ambulatory Visit (INDEPENDENT_AMBULATORY_CARE_PROVIDER_SITE_OTHER): Payer: Medicare Other

## 2019-03-30 DIAGNOSIS — Z0000 Encounter for general adult medical examination without abnormal findings: Secondary | ICD-10-CM | POA: Diagnosis not present

## 2019-03-30 NOTE — Patient Instructions (Addendum)
  Ms. Stotts , Thank you for taking time to come for your Medicare Wellness Visit. I appreciate your ongoing commitment to your health goals. Please review the following plan we discussed and let me know if I can assist you in the future.   These are the goals we discussed: Goals      Patient Stated   . DIET - INCREASE WATER INTAKE (pt-stated)     Stay hydrated      Other   . Exercise 150 minutes per week (moderate activity)       Lifeline: http://www.lifelinesys.com/content/home; 2205769924 x2102   Contact your insurance carrier to inquire of coverage for life alert/medic alert service and/or if they provide a unit.   See medic alert information offered by [facility].  This is a list of the screening recommended for you and due dates:  Health Maintenance  Topic Date Due  . Tetanus Vaccine  05/23/2017  . Flu Shot  03/19/2019  . DEXA scan (bone density measurement)  Completed  . Pneumonia vaccines  Completed

## 2019-03-30 NOTE — Progress Notes (Signed)
Subjective:   Lula OlszewskiJacqueline L Apuzzo is a 85 y.o. female who presents for Medicare Annual (Subsequent) preventive examination.  Review of Systems:  No ROS.  Medicare Wellness Virtual Visit.  Visual/audio telehealth visit, UTA vital signs.   See social history for additional risk factors.   Cardiac Risk Factors include: advanced age (>3755men, 68>65 women);hypertension     Objective:     Vitals: There were no vitals taken for this visit.  There is no height or weight on file to calculate BMI.  Advanced Directives 03/30/2019 07/21/2018 03/16/2018 01/26/2018 01/09/2018 10/26/2017 01/06/2017  Does Patient Have a Medical Advance Directive? Yes Yes Yes Yes No Yes Yes  Type of Advance Directive Living will;Healthcare Power of [employer] Corporationttorney Healthcare Power of [place];Living will Healthcare Power of [place];Living will Healthcare Power of [place];Living will - Healthcare Power of [place];Living will Living will;Healthcare Power of Attorney  Does patient want to make changes to medical advance directive? No - Patient declined - No - Patient declined - - No - Patient declined No - Patient declined  Copy of Healthcare Power of Attorney in Chart? No - copy requested - No - copy requested - - - -  Would patient like information on creating a medical advance directive? - No - Patient declined - No - Patient declined No - Patient declined - -    Tobacco Social History   Tobacco Use  Smoking Status Never Smoker  Smokeless Tobacco Never Used     Counseling given: Not Answered   Clinical Intake:  Pre-visit preparation completed: Yes        Diabetes: No  How often do you need to have someone help you when you read instructions, pamphlets, or other written materials from your doctor or pharmacy?: 1 - Never  Interpreter Needed?: No     Past Medical History:  Diagnosis Date  . Allergic rhinitis   . Anemia   . Anxiety   . Cellulitis   . Cholelithiasis   . Essential hypertension   . History of  stress test    a. 12/2008 Ex MV: EF 78%, no ischemia.  . Insomnia   . Osteoarthritis   . Tachycardia   . Valvular heart disease    a. 12/2008 Echo: EF 65%, mild MR;  b. 2/6 SEM RUSB - insignificant murmur, prob Ao Sclerosis.   Past Surgical History:  Procedure Laterality Date  . BACK SURGERY    . BUNIONECTOMY    . CHOLECYSTECTOMY  03/29/2007  . GALLBLADDER SURGERY    . HIP SURGERY     bilateral   . REPLACEMENT TOTAL KNEE Right   . THUMB ARTHROSCOPY     Family History  Problem Relation Age of Onset  . Heart attack Father   . Stroke Father   . Heart disease Father   . CAD Father   . Heart attack Son [id number]       MI  . Hypertension Son   . Hypertension Mother   . Heart disease Mother   . Stroke Mother   . Breast cancer Neg Hx    Social History   Socioeconomic History  . Marital status: Widowed    Spouse name: Not on file  . Number of children: Not on file  . Years of education: Not on file  . Highest education level: Not on file  Occupational History  . Not on file  Social Needs  . Financial resource strain: Not hard at all  . Food insecurity    Worry: Never  true    Inability: Never true  . Transportation needs    Medical: No    Non-medical: No  Tobacco Use  . Smoking status: Never Smoker  . Smokeless tobacco: Never Used  Substance and Sexual Activity  . Alcohol use: No  . Drug use: No  . Sexual activity: Not Currently  Lifestyle  . Physical activity    Days per week: 4 days    Minutes per session: 20 min  . Stress: Not at all  Relationships  . Social Herbalist on phone: Not on file    Gets together: Not on file    Attends religious service: Not on file    Active member of club or organization: Not on file    Attends meetings of clubs or organizations: Not on file    Relationship status: Not on file  Other Topics Concern  . Not on file  Social History Narrative  . Not on file    Outpatient Encounter Medications as of 03/30/2019   Medication Sig  . acetaminophen (TYLENOL) 500 MG tablet Take 1,000 mg by mouth every 8 (eight) hours as needed for mild pain or moderate pain.  Marland Kitchen ALPRAZolam (XANAX) 0.25 MG tablet Take 1 tablet (0.25 mg total) by mouth at bedtime as needed.  Marland Kitchen amLODipine (NORVASC) 2.5 MG tablet TAKE ONE TABLET EVERY DAY  . aspirin (ASPIRIN 81) 81 MG EC tablet Take 81 mg by mouth daily. Swallow whole.  Marland Kitchen atorvastatin (LIPITOR) 10 MG tablet TAKE 1 TABLET BY MOUTH DAILY  . cholecalciferol (VITAMIN D) 1000 UNITS tablet Take 1,000 Units by mouth every other day.   . cyanocobalamin 100 MCG tablet Take 100 mcg by mouth daily.  Marland Kitchen triamterene-hydrochlorothiazide (MAXZIDE-25) 37.5-25 MG tablet TAKE ONE TABLET EVERY DAY   No facility-administered encounter medications on file as of 03/30/2019.     Activities of Daily Living In your present state of health, do you have any difficulty performing the following activities: 03/30/2019  Hearing? Y  Comment Wears hearing aids, bilateral  Vision? N  Difficulty concentrating or making decisions? N  Walking or climbing stairs? N  Comment She paces herself when climbing stairs  Dressing or bathing? N  Doing errands, shopping? N  Preparing Food and eating ? N  Using the Toilet? N  In the past six months, have you accidently leaked urine? N  Do you have problems with loss of bowel control? N  Managing your Medications? N  Managing your Finances? N  Housekeeping or managing your Housekeeping? N  Some recent data might be hidden    Patient Care Team: Einar Pheasant, MD as PCP - General (Internal Medicine)    Assessment:   This is a routine wellness examination for [place].  I connected with patient 03/30/19 at 11:00 AM EDT by an audio enabled telemedicine application and verified that I am speaking with the correct person using two identifiers. Patient stated full name and DOB. Patient gave permission to continue with virtual visit. Patient's location was at home and  Nurse's location was at [place] office.   Health Screenings  Mammogram 3D - 08/2018 Colonoscopy - 06/2015 Bone Density - 06/2015 Glaucoma -none Hearing - wears hearing aids, bilateral Hemoglobin A1C - 08/2018 (6.1) Cholesterol - 08/2018 Dental- visits every 6 months. Vision- visits within the last 12 months. Cataract surgery scheduled in October 2020.   Social  Alcohol intake - yes, 1 glass of wine per week        Smoking  history- never    Smokers in home? none Illicit drug use? none Exercise - walks her dog twice daily for 1-2 blocks Diet - low cholesterol Sexually Active -not currently BMI- discussed the importance of a healthy diet, water intake and the benefits of aerobic exercise.  Educational material provided.   Safety  Patient feels safe at home- yes Patient does have smoke detectors at home- yes Patient does wear sunscreen or protective clothing when in direct sunlight -yes Patient does wear seat belt when in a moving vehicle -yes Patient drives- yes. Sitter to accompany her when traveling to the next town for safety precautions.  Life alert- no. Community resources provided mailed to patient.  Alarm system- yes  Covid-19 precautions and sickness symptoms discussed. Patient wears mask when in public. A friend assists with grocery shopping to help limit exposure.   Activities of Daily Living Patient denies needing assistance with: driving, household chores, feeding themselves, getting from bed to chair, getting to the toilet, bathing/showering, dressing, managing money, or preparing meals.   She has a friend   Depression Screen Patient denies losing interest in daily life, feeling hopeless, or crying easily over simple problems. States she only feels blue every once in awhile and is taking medication as directed as well as choosing to stay active.   Medication-taking as directed and without issues.   Fall Screen Patient denies being afraid of falling or falling in  the last year.   Memory Screen Patient is alert.  Patient denies difficulty focusing, concentrating or misplacing items. Correctly identified the president of the [place], season and recall. Patient plans to read and complete jigsaw puzzles for brain stimulation.  Immunizations The following Immunizations were discussed: Influenza, shingles, pneumonia, and tetanus.   Reports cortisone injection R hand received yesterday at [facility] for arthritis in hands ann trigger finger. Bilateral braces worn daily for support. Dr. Stephenie AcresSoria.   Other Providers Patient Care Team: Dale DurhamScott, Charlene, MD as PCP - General (Internal Medicine)  Exercise Activities and Dietary recommendations Current Exercise Habits: Home exercise routine, Type of exercise: walking, Time (Minutes): 20, Frequency (Times/Week): 4, Weekly Exercise (Minutes/Week): 80, Intensity: Mild  Goals      Patient Stated   . DIET - INCREASE WATER INTAKE (pt-stated)     Stay hydrated      Other   . Exercise 150 minutes per week (moderate activity)       Fall Risk Fall Risk  03/30/2019 03/16/2018 05/08/2017 06/07/2015  Falls in the past year? 0 No No No   Is the patient's home free of loose throw rugs in walkways, pet beds, electrical cords, etc? yes      Grab bars in the bathroom? yes      Handrails on the stairs? yes        Adequate lighting? yes    Timed Get Up and Go performed: no, virtual visit  Depression Screen PHQ 2/9 Scores 03/30/2019 03/16/2018 05/08/2017 11/11/2016  PHQ - 2 Score 1 0 0 0  PHQ- 9 Score - - 0 3     Cognitive Function MMSE - Mini Mental State Exam 03/16/2018  Orientation to time 5  Orientation to Place 5  Registration 3  Attention/ Calculation 5  Recall 3  Language- name 2 objects 2  Language- repeat 1  Language- follow 3 step command 3  Language- read & follow direction 1  Write a sentence 1  Copy design 1  Total score 30     6CIT Screen 03/30/2019  What Year? 0 points  What month? 0 points   What time? 0 points  Count back from 20 0 points  Months in reverse 0 points  Repeat phrase 0 points  Total Score 0    Immunization History  Administered Date(s) Administered  . Influenza Split 05/16/2010, 07/22/2012  . Influenza, High Dose Seasonal PF 06/09/2014, 06/07/2015, 05/08/2017  . Influenza,inj,Quad PF,6+ Mos 05/18/2013  . Influenza,inj,quad, With Preservative 06/18/2018  . Influenza-Unspecified 06/11/2016, 06/18/2018  . Pneumococcal Conjugate-13 06/09/2014  . Pneumococcal Polysaccharide-23 06/18/2004  . Td 05/24/2007  . Zoster 01/28/2008   Screening Tests Health Maintenance  Topic Date Due  . TETANUS/TDAP  05/23/2017  . INFLUENZA VACCINE  03/19/2019  . DEXA SCAN  Completed  . PNA vac Low Risk Adult  Completed      Plan:    End of life planning; Advance aging; Advanced directives discussed.  Copy of current HCPOA/Living Will requested.    Fasting labs scheduled 05/02/19.   Audio f/u scheduled with pcp 05/04/19.   Consent given to schedule AWV 2021.   Marland Kitchen..Lifeline: [email or web address]; [phone]   Keep all routine maintenance appointments.   I have personally reviewed and noted the following in the patient's chart:   . Medical and social history . Use of alcohol, tobacco or illicit drugs  . Current medications and supplements . Functional ability and status . Nutritional status . Physical activity . Advanced directives . List of other physicians . ER visits in previous 12 months- none . Vitals . Screenings to include cognitive, depression, and falls . Referrals and appointments  In addition, I have reviewed and discussed with patient certain preventive protocols, quality metrics, and best practice recommendations. A written personalized care plan for preventive services as well as general preventive health recommendations were provided to patient.     Ashok PallOBrien-Blaney, Nathifa Ritthaler L, LPN  9/81/19148/07/2019   Reviewed above  information.  Agree with assessment and plan.    Dr Lorin PicketScott

## 2019-04-12 ENCOUNTER — Other Ambulatory Visit: Payer: Self-pay | Admitting: Cardiovascular Disease

## 2019-05-02 ENCOUNTER — Other Ambulatory Visit: Payer: Self-pay

## 2019-05-02 ENCOUNTER — Other Ambulatory Visit (INDEPENDENT_AMBULATORY_CARE_PROVIDER_SITE_OTHER): Payer: Medicare Other

## 2019-05-02 DIAGNOSIS — I10 Essential (primary) hypertension: Secondary | ICD-10-CM

## 2019-05-02 DIAGNOSIS — R739 Hyperglycemia, unspecified: Secondary | ICD-10-CM

## 2019-05-02 DIAGNOSIS — R1032 Left lower quadrant pain: Secondary | ICD-10-CM

## 2019-05-02 DIAGNOSIS — E782 Mixed hyperlipidemia: Secondary | ICD-10-CM | POA: Diagnosis not present

## 2019-05-02 LAB — LIPID PANEL
Cholesterol: 119 mg/dL (ref 0–200)
HDL: 40.9 mg/dL (ref >39.00)
LDL Cholesterol: 45 mg/dL (ref 0–99)
NonHDL: 78.4
Total CHOL/HDL Ratio: 3
Triglycerides: 169 mg/dL — ABNORMAL HIGH (ref 0.0–149.0)
VLDL: 33.8 mg/dL (ref 0.0–40.0)

## 2019-05-02 LAB — CBC
HCT: 38 % (ref 36.0–46.0)
Hemoglobin: 12.5 g/dL (ref 12.0–15.0)
MCHC: 32.9 g/dL (ref 30.0–36.0)
MCV: 89.4 fl (ref 78.0–100.0)
Platelets: 295 10*3/uL (ref 150.0–400.0)
RBC: 4.25 Mil/uL (ref 3.87–5.11)
RDW: 13.5 % (ref 11.5–15.5)
WBC: 9 10*3/uL (ref 4.0–10.5)

## 2019-05-02 LAB — BASIC METABOLIC PANEL
BUN: 30 mg/dL — ABNORMAL HIGH (ref 6–23)
CO2: 27 mEq/L (ref 19–32)
Calcium: 10.2 mg/dL (ref 8.4–10.5)
Chloride: 104 mEq/L (ref 96–112)
Creatinine, Ser: 1.01 mg/dL (ref 0.40–1.20)
GFR: 52.23 mL/min — ABNORMAL LOW (ref >60.00)
Glucose, Bld: 84 mg/dL (ref 70–99)
Potassium: 3.5 mEq/L (ref 3.5–5.1)
Sodium: 140 mEq/L (ref 135–145)

## 2019-05-02 LAB — TSH: TSH: 1.97 u[IU]/mL (ref 0.35–4.50)

## 2019-05-02 LAB — HEPATIC FUNCTION PANEL
ALT: 16 U/L (ref 0–35)
AST: 15 U/L (ref 0–37)
Albumin: 4.2 g/dL (ref 3.5–5.2)
Alkaline Phosphatase: 76 U/L (ref 39–117)
Bilirubin, Direct: 0.1 mg/dL (ref 0.0–0.3)
Total Bilirubin: 0.5 mg/dL (ref 0.2–1.2)
Total Protein: 6.3 g/dL (ref 6.0–8.3)

## 2019-05-02 LAB — HEMOGLOBIN A1C: Hgb A1c MFr Bld: 6.2 % (ref 4.6–6.5)

## 2019-05-04 ENCOUNTER — Ambulatory Visit (INDEPENDENT_AMBULATORY_CARE_PROVIDER_SITE_OTHER): Payer: Medicare Other | Admitting: Internal Medicine

## 2019-05-04 ENCOUNTER — Encounter: Payer: Self-pay | Admitting: Internal Medicine

## 2019-05-04 DIAGNOSIS — D649 Anemia, unspecified: Secondary | ICD-10-CM

## 2019-05-04 DIAGNOSIS — N183 Chronic kidney disease, stage 3 unspecified: Secondary | ICD-10-CM

## 2019-05-04 DIAGNOSIS — M25562 Pain in left knee: Secondary | ICD-10-CM

## 2019-05-04 DIAGNOSIS — K5909 Other constipation: Secondary | ICD-10-CM | POA: Diagnosis not present

## 2019-05-04 DIAGNOSIS — I700 Atherosclerosis of aorta: Secondary | ICD-10-CM | POA: Diagnosis not present

## 2019-05-04 DIAGNOSIS — I10 Essential (primary) hypertension: Secondary | ICD-10-CM

## 2019-05-04 DIAGNOSIS — H9190 Unspecified hearing loss, unspecified ear: Secondary | ICD-10-CM

## 2019-05-04 DIAGNOSIS — F419 Anxiety disorder, unspecified: Secondary | ICD-10-CM

## 2019-05-04 DIAGNOSIS — R911 Solitary pulmonary nodule: Secondary | ICD-10-CM

## 2019-05-04 DIAGNOSIS — R739 Hyperglycemia, unspecified: Secondary | ICD-10-CM

## 2019-05-04 DIAGNOSIS — E782 Mixed hyperlipidemia: Secondary | ICD-10-CM

## 2019-05-04 DIAGNOSIS — F338 Other recurrent depressive disorders: Secondary | ICD-10-CM

## 2019-05-04 DIAGNOSIS — I739 Peripheral vascular disease, unspecified: Secondary | ICD-10-CM

## 2019-05-04 NOTE — Progress Notes (Signed)
Patient ID: Alicia Mccann, female   DOB: 12/01/1932, 85 y.o.   MRN: [id number]   Virtual Visit via telephone Note  This visit type was conducted due to national recommendations for restrictions regarding the COVID-19 pandemic (e.g. social distancing).  This format is felt to be most appropriate for this patient at this time.  All issues noted in this document were discussed and addressed.  No physical exam was performed (except for noted visual exam findings with Video Visits).   I connected with Alicia Mccann by telephone and verified that I am speaking with the correct person using two identifiers. Location patient: home Location provider: work Persons participating in the telephone visit: patient, provider  I discussed the limitations, risks, security and privacy concerns of performing an evaluation and management service by telephone and the availability of in person appointments.  The patient expressed understanding and agreed to proceed.   Reason for visit: scheduled follow up.    HPI: She has seen [facility] for her hands.  Better.  Some discomfort in her left knee.  Using biofreeze.  Helps.  Still some pain.  Request appt with Dr Lavonne Chick for evaluation.  Tries to stay active.  Was having some back issues.  Got a new mattress.  Better.  No chest pain.  No sob.  No acid reflux.  Bowels better. No abdominal pain.  No issues since last visit.  Discussed labs.  Planning for cataract surgery.  Seeing Dr Pryor Ochoa today for f/u hearing aids.     ROS: See pertinent positives and negatives per HPI.  Past Medical History:  Diagnosis Date  . Allergic rhinitis   . Anemia   . Anxiety   . Cellulitis   . Cholelithiasis   . Essential hypertension   . History of stress test    a. 12/2008 Ex MV: EF 78%, no ischemia.  . Insomnia   . Osteoarthritis   . Tachycardia   . Valvular heart disease    a. 12/2008 Echo: EF 65%, mild MR;  b. 2/6 SEM RUSB - insignificant murmur, prob Ao Sclerosis.     Past Surgical History:  Procedure Laterality Date  . BACK SURGERY    . BUNIONECTOMY    . CHOLECYSTECTOMY  03/29/2007  . GALLBLADDER SURGERY    . HIP SURGERY     bilateral   . REPLACEMENT TOTAL KNEE Right   . THUMB ARTHROSCOPY      Family History  Problem Relation Age of Onset  . Heart attack Father   . Stroke Father   . Heart disease Father   . CAD Father   . Heart attack Son 47       MI  . Hypertension Son   . Hypertension Mother   . Heart disease Mother   . Stroke Mother   . Breast cancer Neg Hx     SOCIAL HX: reviewed.    Current Outpatient Medications:  .  acetaminophen (TYLENOL) 500 MG tablet, Take 1,000 mg by mouth every 8 (eight) hours as needed for mild pain or moderate pain., Disp: , Rfl:  .  ALPRAZolam (XANAX) 0.25 MG tablet, Take 1 tablet (0.25 mg total) by mouth at bedtime as needed., Disp: 30 tablet, Rfl: 0 .  amLODipine (NORVASC) 2.5 MG tablet, TAKE ONE TABLET EVERY DAY, Disp: 90 tablet, Rfl: 3 .  aspirin (ASPIRIN 81) 81 MG EC tablet, Take 81 mg by mouth daily. Swallow whole., Disp: , Rfl:  .  atorvastatin (LIPITOR) 10 MG tablet, TAKE ONE  TABLET EVERY DAY, Disp: 30 tablet, Rfl: 0 .  cholecalciferol (VITAMIN D) 1000 UNITS tablet, Take 1,000 Units by mouth every other day. , Disp: , Rfl:  .  cyanocobalamin 100 MCG tablet, Take 100 mcg by mouth daily., Disp: , Rfl:  .  triamterene-hydrochlorothiazide (MAXZIDE-25) 37.5-25 MG tablet, TAKE ONE TABLET EVERY DAY, Disp: 90 tablet, Rfl: 3  EXAM:  GENERAL: alert. Sounds to be in no acute distress.  Answering questions appropriately.    PSYCH/NEURO: pleasant and cooperative, no obvious depression or anxiety, speech and thought processing grossly intact  ASSESSMENT AND PLAN:  Discussed the following assessment and plan:  Anemia Follow cbc/ferritin.   Anxiety Discussed with her today.  Some increased stress.  Desires no further intervention.  Follow.    Aortic atherosclerosis (HCC) On lipitor.    Chronic  constipation Bowels moving better.  Follow.    CKD (chronic kidney disease) stage 3, GFR 30-59 ml/min (HCC) Avoid antiinflammatories.  GFR 52.  Follow.    Decreased hearing Seeing Dr Pryor Ochoa today.    Essential hypertension Blood pressure has been doing well.  Follow.    Hyperglycemia Low carb diet and exercise.  Follow met b and a1c.  Last a1c 6.2.    Lung nodule CT 03/18/19 - stable.    Mixed hyperlipidemia Low cholesterol diet and exercise.  Follow lipid panel and liver function tests.    PAD (peripheral artery disease) ([place]) Continue crestor and aspirin.    Seasonal affective disorder Discussed with her today.  Does not feel she needs any further intervention.  Follow.    Left knee pain Now pain in her left knee.  Using biofreeze.  Avoid antiinflammatories given CKD.  Tylenol.  She request referral to Dr Lavonne Chick.     I discussed the assessment and treatment plan with the patient. The patient was provided an opportunity to ask questions and all were answered. The patient agreed with the plan and demonstrated an understanding of the instructions.   The patient was advised to call back or seek an in-person evaluation if the symptoms worsen or if the condition fails to improve as anticipated.  I provided 25 minutes of non-face-to-face time during this encounter.   Einar Pheasant, MD

## 2019-05-08 ENCOUNTER — Encounter: Payer: Self-pay | Admitting: Internal Medicine

## 2019-05-08 DIAGNOSIS — M25562 Pain in left knee: Secondary | ICD-10-CM | POA: Insufficient documentation

## 2019-05-08 NOTE — Assessment & Plan Note (Signed)
Blood pressure has been doing well.  Follow.   

## 2019-05-08 NOTE — Assessment & Plan Note (Signed)
Follow cbc/ferritin.  

## 2019-05-08 NOTE — Assessment & Plan Note (Signed)
Discussed with her today.  Does not feel she needs any further intervention.  Follow.   

## 2019-05-08 NOTE — Assessment & Plan Note (Signed)
Now pain in her left knee.  Using biofreeze.  Avoid antiinflammatories given CKD.  Tylenol.  She request referral to Dr Lavonne Chick.

## 2019-05-08 NOTE — Assessment & Plan Note (Signed)
Seeing Dr Pryor Ochoa today.

## 2019-05-08 NOTE — Assessment & Plan Note (Signed)
Discussed with her today.  Some increased stress.  Desires no further intervention.  Follow.

## 2019-05-08 NOTE — Assessment & Plan Note (Signed)
CT 03/18/19 - stable.

## 2019-05-08 NOTE — Assessment & Plan Note (Signed)
On lipitor

## 2019-05-08 NOTE — Assessment & Plan Note (Signed)
Avoid antiinflammatories.  GFR 52.  Follow.

## 2019-05-08 NOTE — Assessment & Plan Note (Signed)
Continue crestor and aspirin.  

## 2019-05-08 NOTE — Assessment & Plan Note (Signed)
Low carb diet and exercise.  Follow met b and a1c.  Last a1c 6.2.

## 2019-05-08 NOTE — Assessment & Plan Note (Signed)
Low cholesterol diet and exercise.  Follow lipid panel and liver function tests.  

## 2019-05-08 NOTE — Assessment & Plan Note (Signed)
Bowels moving better.  Follow.

## 2019-05-13 ENCOUNTER — Other Ambulatory Visit
Admission: RE | Admit: 2019-05-13 | Discharge: 2019-05-13 | Disposition: A | Discharge status: Home / Self Care | Payer: Medicare Other | Source: Ambulatory Visit | Attending: Ophthalmology | Admitting: Ophthalmology

## 2019-05-13 DIAGNOSIS — Z01812 Encounter for preprocedural laboratory examination: Secondary | ICD-10-CM | POA: Insufficient documentation

## 2019-05-13 DIAGNOSIS — Z20828 Contact with and (suspected) exposure to other viral communicable diseases: Secondary | ICD-10-CM | POA: Diagnosis not present

## 2019-05-14 LAB — SARS CORONAVIRUS 2 (TAT 6-24 HRS): SARS Coronavirus 2: NEGATIVE

## 2019-05-17 NOTE — Discharge Instructions (Signed)

## 2019-05-18 ENCOUNTER — Ambulatory Visit: Payer: Medicare Other | Admitting: Anesthesiology

## 2019-05-18 ENCOUNTER — Encounter
Admission: RE | Disposition: A | Discharge status: Home / Self Care | Payer: Self-pay | Source: Home / Self Care | Attending: Ophthalmology

## 2019-05-18 ENCOUNTER — Ambulatory Visit
Admission: RE | Admit: 2019-05-18 | Discharge: 2019-05-18 | Disposition: A | Discharge status: Home / Self Care | Payer: Medicare Other | Attending: Ophthalmology | Admitting: Ophthalmology

## 2019-05-18 ENCOUNTER — Other Ambulatory Visit: Payer: Self-pay

## 2019-05-18 DIAGNOSIS — M810 Age-related osteoporosis without current pathological fracture: Secondary | ICD-10-CM | POA: Diagnosis not present

## 2019-05-18 DIAGNOSIS — H9190 Unspecified hearing loss, unspecified ear: Secondary | ICD-10-CM | POA: Diagnosis not present

## 2019-05-18 DIAGNOSIS — Z79899 Other long term (current) drug therapy: Secondary | ICD-10-CM | POA: Diagnosis not present

## 2019-05-18 DIAGNOSIS — I10 Essential (primary) hypertension: Secondary | ICD-10-CM | POA: Diagnosis not present

## 2019-05-18 DIAGNOSIS — E7800 Pure hypercholesterolemia, unspecified: Secondary | ICD-10-CM | POA: Insufficient documentation

## 2019-05-18 DIAGNOSIS — H5703 Miosis: Secondary | ICD-10-CM | POA: Diagnosis not present

## 2019-05-18 DIAGNOSIS — M1990 Unspecified osteoarthritis, unspecified site: Secondary | ICD-10-CM | POA: Insufficient documentation

## 2019-05-18 DIAGNOSIS — Z7982 Long term (current) use of aspirin: Secondary | ICD-10-CM | POA: Diagnosis not present

## 2019-05-18 DIAGNOSIS — H2511 Age-related nuclear cataract, right eye: Secondary | ICD-10-CM | POA: Insufficient documentation

## 2019-05-18 HISTORY — PX: CATARACT EXTRACTION W/PHACO: SHX586

## 2019-05-18 SURGERY — PHACOEMULSIFICATION, CATARACT, WITH IOL INSERTION
Anesthesia: Monitor Anesthesia Care | Site: Eye | Laterality: Right

## 2019-05-18 MED ORDER — ARMC OPHTHALMIC DILATING DROPS
1.0000 "application " | OPHTHALMIC | Status: DC | PRN
  Administered 2019-05-18 (×3): 1 via OPHTHALMIC

## 2019-05-18 MED ORDER — TETRACAINE HCL 0.5 % OP SOLN
1.0000 [drp] | OPHTHALMIC | Status: DC | PRN
  Administered 2019-05-18 (×3): 1 [drp] via OPHTHALMIC

## 2019-05-18 MED ORDER — NA HYALUR & NA CHOND-NA HYALUR 0.4-0.35 ML IO KIT
PACK | INTRAOCULAR | Status: DC | PRN
  Administered 2019-05-18: 1 mL via INTRAOCULAR

## 2019-05-18 MED ORDER — BRIMONIDINE TARTRATE-TIMOLOL 0.2-0.5 % OP SOLN
OPHTHALMIC | Status: DC | PRN
  Administered 2019-05-18: 1 [drp] via OPHTHALMIC

## 2019-05-18 MED ORDER — LACTATED RINGERS IV SOLN: INTRAVENOUS | Status: DC

## 2019-05-18 MED ORDER — LIDOCAINE HCL (PF) 2 % IJ SOLN
INTRAOCULAR | Status: DC | PRN
  Administered 2019-05-18: 1 mL

## 2019-05-18 MED ORDER — MOXIFLOXACIN HCL 0.5 % OP SOLN
1.0000 [drp] | OPHTHALMIC | Status: DC | PRN
  Administered 2019-05-18 (×3): 1 [drp] via OPHTHALMIC

## 2019-05-18 MED ORDER — EPINEPHRINE PF 1 MG/ML IJ SOLN
INTRAOCULAR | Status: DC | PRN
  Administered 2019-05-18: 11:00:00 70 mL via OPHTHALMIC

## 2019-05-18 MED ORDER — MOXIFLOXACIN HCL 0.5 % OP SOLN
OPHTHALMIC | Status: DC | PRN
  Administered 2019-05-18: 0.2 mL via OPHTHALMIC

## 2019-05-18 MED ORDER — MIDAZOLAM HCL 2 MG/2ML IJ SOLN
INTRAMUSCULAR | Status: DC | PRN
  Administered 2019-05-18 (×2): 1 mg via INTRAVENOUS

## 2019-05-18 MED ORDER — FENTANYL CITRATE (PF) 100 MCG/2ML IJ SOLN
INTRAMUSCULAR | Status: DC | PRN
  Administered 2019-05-18 (×2): 50 ug via INTRAVENOUS

## 2019-05-18 SURGICAL SUPPLY — 17 items
CANNULA ANT/CHMB 27G (MISCELLANEOUS) ×1 IMPLANT
CANNULA ANT/CHMB 27GA (MISCELLANEOUS) ×3 IMPLANT
GLOVE SURG LX 7.5 STRW (GLOVE) ×2
GLOVE SURG LX STRL 7.5 STRW (GLOVE) ×1 IMPLANT
GLOVE SURG TRIUMPH 8.0 PF LTX (GLOVE) ×3 IMPLANT
GOWN STRL REUS W/ TWL LRG LVL3 (GOWN DISPOSABLE) ×2 IMPLANT
GOWN STRL REUS W/TWL LRG LVL3 (GOWN DISPOSABLE) ×6
LENS IOL TECNIS ITEC 19.5 (Intraocular Lens) ×2 IMPLANT
MARKER SKIN DUAL TIP RULER LAB (MISCELLANEOUS) ×3 IMPLANT
PACK CATARACT BRASINGTON (MISCELLANEOUS) ×3 IMPLANT
PACK EYE AFTER SURG (MISCELLANEOUS) ×3 IMPLANT
PACK OPTHALMIC (MISCELLANEOUS) ×3 IMPLANT
RING MALYGIN 7.0 (MISCELLANEOUS) ×2 IMPLANT
SYR 3ML LL SCALE MARK (SYRINGE) ×3 IMPLANT
SYR TB 1ML LUER SLIP (SYRINGE) ×3 IMPLANT
WATER STERILE IRR 500ML POUR (IV SOLUTION) ×3 IMPLANT
WIPE NON LINTING 3.25X3.25 (MISCELLANEOUS) ×3 IMPLANT

## 2019-05-18 NOTE — H&P (Signed)

## 2019-05-18 NOTE — Anesthesia Preprocedure Evaluation (Signed)
Anesthesia Evaluation  Patient identified by MRN, date of birth, ID band Patient awake    Reviewed: Allergy & Precautions, H&P , NPO status , Patient's Chart, lab work & pertinent test results  Airway Mallampati: II  TM Distance: >3 FB Neck ROM: full    Dental no notable dental hx.    Pulmonary    Pulmonary exam normal breath sounds clear to auscultation       Cardiovascular hypertension, + CAD and + Peripheral Vascular Disease  Normal cardiovascular exam+ Valvular Problems/Murmurs  Rhythm:regular Rate:Normal     Neuro/Psych PSYCHIATRIC DISORDERS    GI/Hepatic   Endo/Other    Renal/GU Renal disease     Musculoskeletal   Abdominal   Peds  Hematology   Anesthesia Other Findings   Reproductive/Obstetrics                             Anesthesia Physical Anesthesia Plan  ASA: II  Anesthesia Plan: MAC   Post-op Pain Management:    Induction:   PONV Risk Score and Plan: 2 and Midazolam, TIVA and Treatment may vary due to age or medical condition  Airway Management Planned:   Additional Equipment:   Intra-op Plan:   Post-operative Plan:   Informed Consent: I have reviewed the patients History and Physical, chart, labs and discussed the procedure including the risks, benefits and alternatives for the proposed anesthesia with the patient or authorized representative who has indicated his/her understanding and acceptance.       Plan Discussed with: CRNA  Anesthesia Plan Comments:         Anesthesia Quick Evaluation

## 2019-05-18 NOTE — Transfer of Care (Signed)
Immediate Anesthesia Transfer of Care Note  [name]  Procedure(s) Performed: CATARACT EXTRACTION PHACO AND INTRAOCULAR LENS PLACEMENT (IOC) RIGHT MALYUGIN  01:06.5  20.6%  13.72 (Right Eye)  Patient Location: PACU  Anesthesia Type: MAC  Level of Consciousness: awake, alert  and patient cooperative  Airway and Oxygen Therapy: Patient Spontanous Breathing and Patient connected to supplemental oxygen  Post-op Assessment: Post-op Vital signs reviewed, Patient's Cardiovascular Status Stable, Respiratory Function Stable, Patent Airway and No signs of Nausea or vomiting  Post-op Vital Signs: Reviewed and stable  Complications: No apparent anesthesia complications

## 2019-05-18 NOTE — Op Note (Signed)
OPERATIVE NOTE  Alicia Mccann [id number] 05/18/2019   PREOPERATIVE DIAGNOSIS:    Nuclear Sclerotic Cataract Right eye with miotic pupil.        H25.11  POSTOPERATIVE DIAGNOSIS: Nuclear Sclerotic Cataract Right eye with miotic pupil.          PROCEDURE:  Phacoemusification with posterior chamber intraocular lens placement of the right eye which required pupil stretching with the Malyugin pupil expansion device. Ultrasound time: Procedure(s) with comments: CATARACT EXTRACTION PHACO AND INTRAOCULAR LENS PLACEMENT (IOC) RIGHT MALYUGIN  01:06.5  20.6%  13.72 (Right) - please leave patient arrival 10  LENS:   Implant Name Type Inv. Item Serial No. Manufacturer Lot No. LRB No. Used Action  LENS IOL DIOP 19.5 - [id number] Intraocular Lens LENS IOL DIOP 19.5 [id number]  Right 1 Implanted       SURGEON:  Wyonia Hough, MD   ANESTHESIA:  Topical with tetracaine drops and 2% Xylocaine jelly, augmented with 1% preservative-free intracameral lidocaine.   COMPLICATIONS:  None.   DESCRIPTION OF PROCEDURE:  The patient was identified in the holding room and transported to the operating room and placed in the supine position under the operating microscope. Theright eye was identified as the operative eye and it was prepped and draped in the usual sterile ophthalmic fashion.   A 1 millimeter clear-corneal paracentesis was made at the 12:00 position.  0.5 ml of preservative-free 1% lidocaine was injected into the anterior chamber. The anterior chamber was filled with Viscoat viscoelastic.  A 2.4 millimeter keratome was used to make a near-clear corneal incision at the 9:00 position. A Malyugin pupil expander was then placed through the main incision and into the anterior chamber of the eye.  The edge of the iris was secured on the lip of the pupil expander and it was released, thereby expanding the pupil to approximately 7 millimeters for completion of the cataract surgery.  Additional  Viscoat was placed in the anterior chamber.  A cystotome and capsulorrhexis forceps were used to make a curvilinear capsulorrhexis.   Balanced salt solution was used to hydrodissect and hydrodelineate the lens nucleus.   Phacoemulsification was used in stop and chop fashion to remove the lens, nucleus and epinucleus.  The remaining cortex was aspirated using the irrigation aspiration handpiece.  Additional Provisc was placed into the eye to distend the capsular bag for lens placement.  A lens was then injected into the capsular bag.  The pupil expanding ring was removed using a Kuglen hook and insertion device. The remaining viscoelastic was aspirated from the capsular bag and the anterior chamber.  The anterior chamber was filled with balanced salt solution to inflate to a physiologic pressure.  Wounds were hydrated with balanced salt solution.  The anterior chamber was inflated to a physiologic pressure with balanced salt solution.  No wound leaks were noted.Vigamox 0.2 ml of a 1mg  per ml solution was injected into the anterior chamber for a dose of 0.2 mg of intracameral antibiotic at the completion of the case. Timolol and Brimonidine drops were applied to the eye.  The patient was taken to the recovery room in stable condition without complications of anesthesia or surgery.  Abi Shoults 05/18/2019, 11:29 AM

## 2019-05-18 NOTE — Anesthesia Postprocedure Evaluation (Signed)
Anesthesia Post Note  [name]  Procedure(s) Performed: CATARACT EXTRACTION PHACO AND INTRAOCULAR LENS PLACEMENT (IOC) RIGHT MALYUGIN  01:06.5  20.6%  13.72 (Right Eye)  Patient location during evaluation: PACU Anesthesia Type: MAC Level of consciousness: awake and alert and oriented Pain management: satisfactory to patient Vital Signs Assessment: post-procedure vital signs reviewed and stable Respiratory status: spontaneous breathing, nonlabored ventilation and respiratory function stable Cardiovascular status: blood pressure returned to baseline and stable Postop Assessment: Adequate PO intake and No signs of nausea or vomiting Anesthetic complications: no    Raliegh Ip

## 2019-05-31 ENCOUNTER — Telehealth: Payer: Self-pay

## 2019-05-31 NOTE — Telephone Encounter (Signed)
Copied from [place] [id number]. Topic: General - Other >> May 31, 2019 12:03 PM Lennox Solders wrote: Reason for CRM: pt is calling and has only received regular flu shot in the past . Pt would like to know if she should get regular or high dose due to age

## 2019-05-31 NOTE — Telephone Encounter (Signed)
Returned patient's call.  Informed patient that based on age of 76 and older is recommended to get the high dose flu shot.  Patient had no further questions.  Patient said that she will be getting her flu shot at [place].

## 2019-06-01 ENCOUNTER — Encounter: Payer: Self-pay | Admitting: *Deleted

## 2019-06-01 ENCOUNTER — Other Ambulatory Visit: Payer: Self-pay

## 2019-06-02 NOTE — Anesthesia Preprocedure Evaluation (Addendum)
Anesthesia Evaluation  Patient identified by MRN, date of birth, ID band Patient awake    Reviewed: Allergy & Precautions, H&P , NPO status , Patient's Chart, lab work & pertinent test results  History of Anesthesia Complications Negative for: history of anesthetic complications  Airway Mallampati: II  TM Distance: >3 FB Neck ROM: full    Dental no notable dental hx.    Pulmonary    Pulmonary exam normal breath sounds clear to auscultation       Cardiovascular hypertension, (-) angina+ CAD and + Peripheral Vascular Disease  (-) DOE Normal cardiovascular exam+ Valvular Problems/Murmurs  Rhythm:regular Rate:Normal   HLD   Neuro/Psych PSYCHIATRIC DISORDERS Anxiety Depression  Neuromuscular disease (Carpal tunnel)    GI/Hepatic neg GERD  , Diverticulosis   Endo/Other    Renal/GU CRFRenal disease     Musculoskeletal  (+) Arthritis ,   Abdominal (+) + obese (BMI 30),   Peds  Hematology  (+) anemia ,   Anesthesia Other Findings   Reproductive/Obstetrics                            Anesthesia Physical  Anesthesia Plan  ASA: II  Anesthesia Plan: MAC   Post-op Pain Management:    Induction: Intravenous  PONV Risk Score and Plan: 2 and Midazolam, TIVA and Treatment may vary due to age or medical condition  Airway Management Planned: Nasal Cannula  Additional Equipment:   Intra-op Plan:   Post-operative Plan:   Informed Consent: I have reviewed the patients History and Physical, chart, labs and discussed the procedure including the risks, benefits and alternatives for the proposed anesthesia with the patient or authorized representative who has indicated his/her understanding and acceptance.       Plan Discussed with: CRNA  Anesthesia Plan Comments:        Anesthesia Quick Evaluation

## 2019-06-03 ENCOUNTER — Other Ambulatory Visit
Admission: RE | Admit: 2019-06-03 | Discharge: 2019-06-03 | Disposition: A | Discharge status: Home / Self Care | Payer: Medicare Other | Source: Ambulatory Visit | Attending: Ophthalmology | Admitting: Ophthalmology

## 2019-06-03 ENCOUNTER — Other Ambulatory Visit: Payer: Self-pay

## 2019-06-03 DIAGNOSIS — Z20828 Contact with and (suspected) exposure to other viral communicable diseases: Secondary | ICD-10-CM | POA: Insufficient documentation

## 2019-06-03 DIAGNOSIS — Z01812 Encounter for preprocedural laboratory examination: Secondary | ICD-10-CM | POA: Diagnosis present

## 2019-06-04 LAB — SARS CORONAVIRUS 2 (TAT 6-24 HRS): SARS Coronavirus 2: NEGATIVE

## 2019-06-06 NOTE — Discharge Instructions (Signed)

## 2019-06-08 ENCOUNTER — Ambulatory Visit: Payer: Medicare Other | Admitting: Anesthesiology

## 2019-06-08 ENCOUNTER — Ambulatory Visit
Admission: RE | Admit: 2019-06-08 | Discharge: 2019-06-08 | Disposition: A | Discharge status: Home / Self Care | Payer: Medicare Other | Source: Ambulatory Visit | Attending: Ophthalmology | Admitting: Ophthalmology

## 2019-06-08 ENCOUNTER — Encounter
Admission: RE | Disposition: A | Discharge status: Home / Self Care | Payer: Self-pay | Source: Ambulatory Visit | Attending: Ophthalmology

## 2019-06-08 DIAGNOSIS — I10 Essential (primary) hypertension: Secondary | ICD-10-CM | POA: Diagnosis not present

## 2019-06-08 DIAGNOSIS — Z955 Presence of coronary angioplasty implant and graft: Secondary | ICD-10-CM | POA: Insufficient documentation

## 2019-06-08 DIAGNOSIS — I739 Peripheral vascular disease, unspecified: Secondary | ICD-10-CM | POA: Insufficient documentation

## 2019-06-08 DIAGNOSIS — Z79899 Other long term (current) drug therapy: Secondary | ICD-10-CM | POA: Insufficient documentation

## 2019-06-08 DIAGNOSIS — M1990 Unspecified osteoarthritis, unspecified site: Secondary | ICD-10-CM | POA: Insufficient documentation

## 2019-06-08 DIAGNOSIS — E785 Hyperlipidemia, unspecified: Secondary | ICD-10-CM | POA: Diagnosis not present

## 2019-06-08 DIAGNOSIS — I2510 Atherosclerotic heart disease of native coronary artery without angina pectoris: Secondary | ICD-10-CM | POA: Diagnosis not present

## 2019-06-08 DIAGNOSIS — H2512 Age-related nuclear cataract, left eye: Secondary | ICD-10-CM | POA: Diagnosis present

## 2019-06-08 DIAGNOSIS — Z882 Allergy status to sulfonamides status: Secondary | ICD-10-CM | POA: Diagnosis not present

## 2019-06-08 DIAGNOSIS — Z880 Allergy status to penicillin: Secondary | ICD-10-CM | POA: Diagnosis not present

## 2019-06-08 DIAGNOSIS — Z7982 Long term (current) use of aspirin: Secondary | ICD-10-CM | POA: Insufficient documentation

## 2019-06-08 DIAGNOSIS — F419 Anxiety disorder, unspecified: Secondary | ICD-10-CM | POA: Diagnosis not present

## 2019-06-08 HISTORY — PX: CATARACT EXTRACTION W/PHACO: SHX586

## 2019-06-08 SURGERY — PHACOEMULSIFICATION, CATARACT, WITH IOL INSERTION
Anesthesia: Monitor Anesthesia Care | Site: Eye | Laterality: Left

## 2019-06-08 MED ORDER — NA HYALUR & NA CHOND-NA HYALUR 0.4-0.35 ML IO KIT
PACK | INTRAOCULAR | Status: DC | PRN
  Administered 2019-06-08: 1 mL via INTRAOCULAR

## 2019-06-08 MED ORDER — ARMC OPHTHALMIC DILATING DROPS
1.0000 "application " | OPHTHALMIC | Status: DC | PRN
  Administered 2019-06-08 (×3): 1 via OPHTHALMIC

## 2019-06-08 MED ORDER — MOXIFLOXACIN HCL 0.5 % OP SOLN
1.0000 [drp] | OPHTHALMIC | Status: DC | PRN
  Administered 2019-06-08 (×3): 1 [drp] via OPHTHALMIC

## 2019-06-08 MED ORDER — BRIMONIDINE TARTRATE-TIMOLOL 0.2-0.5 % OP SOLN
OPHTHALMIC | Status: DC | PRN
  Administered 2019-06-08: 1 [drp] via OPHTHALMIC

## 2019-06-08 MED ORDER — FENTANYL CITRATE (PF) 100 MCG/2ML IJ SOLN
INTRAMUSCULAR | Status: DC | PRN
  Administered 2019-06-08 (×2): 50 ug via INTRAVENOUS

## 2019-06-08 MED ORDER — TETRACAINE HCL 0.5 % OP SOLN
1.0000 [drp] | OPHTHALMIC | Status: DC | PRN
  Administered 2019-06-08 (×3): 1 [drp] via OPHTHALMIC

## 2019-06-08 MED ORDER — MOXIFLOXACIN HCL 0.5 % OP SOLN
OPHTHALMIC | Status: DC | PRN
  Administered 2019-06-08: 0.2 mL via OPHTHALMIC

## 2019-06-08 MED ORDER — MIDAZOLAM HCL 2 MG/2ML IJ SOLN
INTRAMUSCULAR | Status: DC | PRN
  Administered 2019-06-08: 1 mg via INTRAVENOUS

## 2019-06-08 MED ORDER — EPINEPHRINE PF 1 MG/ML IJ SOLN
INTRAOCULAR | Status: DC | PRN
  Administered 2019-06-08: 47 mL via OPHTHALMIC

## 2019-06-08 MED ORDER — LIDOCAINE HCL (PF) 2 % IJ SOLN
INTRAOCULAR | Status: DC | PRN
  Administered 2019-06-08: 1 mL

## 2019-06-08 MED ORDER — LACTATED RINGERS IV SOLN: 100.0000 mL/h | INTRAVENOUS | Status: DC

## 2019-06-08 SURGICAL SUPPLY — 16 items
CANNULA ANT/CHMB 27G (MISCELLANEOUS) ×1 IMPLANT
CANNULA ANT/CHMB 27GA (MISCELLANEOUS) ×3 IMPLANT
GLOVE SURG LX 7.5 STRW (GLOVE) ×2
GLOVE SURG LX STRL 7.5 STRW (GLOVE) ×1 IMPLANT
GLOVE SURG TRIUMPH 8.0 PF LTX (GLOVE) ×3 IMPLANT
GOWN STRL REUS W/ TWL LRG LVL3 (GOWN DISPOSABLE) ×2 IMPLANT
GOWN STRL REUS W/TWL LRG LVL3 (GOWN DISPOSABLE) ×6
LENS IOL TECNIS ITEC 20.0 (Intraocular Lens) ×2 IMPLANT
MARKER SKIN DUAL TIP RULER LAB (MISCELLANEOUS) ×3 IMPLANT
PACK CATARACT BRASINGTON (MISCELLANEOUS) ×3 IMPLANT
PACK EYE AFTER SURG (MISCELLANEOUS) ×3 IMPLANT
PACK OPTHALMIC (MISCELLANEOUS) ×3 IMPLANT
SYR 3ML LL SCALE MARK (SYRINGE) ×3 IMPLANT
SYR TB 1ML LUER SLIP (SYRINGE) ×3 IMPLANT
WATER STERILE IRR 500ML POUR (IV SOLUTION) ×3 IMPLANT
WIPE NON LINTING 3.25X3.25 (MISCELLANEOUS) ×3 IMPLANT

## 2019-06-08 NOTE — Anesthesia Postprocedure Evaluation (Signed)
Anesthesia Post Note  [name]  Procedure(s) Performed: CATARACT EXTRACTION PHACO AND INTRAOCULAR LENS PLACEMENT (IOC) LEFT  00:51.0  20.3%  10.37 (Left Eye)  Patient location during evaluation: PACU Anesthesia Type: MAC Level of consciousness: awake and alert Pain management: pain level controlled Vital Signs Assessment: post-procedure vital signs reviewed and stable Respiratory status: spontaneous breathing, nonlabored ventilation, respiratory function stable and patient connected to nasal cannula oxygen Cardiovascular status: stable and blood pressure returned to baseline Postop Assessment: no apparent nausea or vomiting Anesthetic complications: no    Kelin Nixon A  Glory Graefe

## 2019-06-08 NOTE — Transfer of Care (Signed)
Immediate Anesthesia Transfer of Care Note  [name]  Procedure(s) Performed: CATARACT EXTRACTION PHACO AND INTRAOCULAR LENS PLACEMENT (IOC) LEFT  00:51.0  20.3%  10.37 (Left Eye)  Patient Location: PACU  Anesthesia Type: MAC  Level of Consciousness: awake, alert  and patient cooperative  Airway and Oxygen Therapy: Patient Spontanous Breathing and Patient connected to supplemental oxygen  Post-op Assessment: Post-op Vital signs reviewed, Patient's Cardiovascular Status Stable, Respiratory Function Stable, Patent Airway and No signs of Nausea or vomiting  Post-op Vital Signs: Reviewed and stable  Complications: No apparent anesthesia complications

## 2019-06-08 NOTE — Anesthesia Procedure Notes (Signed)
Procedure Name: MAC Performed by: Izetta Dakin, CRNA Pre-anesthesia Checklist: Timeout performed, Patient being monitored, Suction available, Emergency Drugs available and Patient identified Patient Re-evaluated:Patient Re-evaluated prior to induction Oxygen Delivery Method: Nasal cannula

## 2019-06-08 NOTE — H&P (Signed)

## 2019-06-08 NOTE — Op Note (Signed)
OPERATIVE NOTE  VERONDA GABOR [id number] 06/08/2019   PREOPERATIVE DIAGNOSIS:  Nuclear sclerotic cataract left eye. H25.12   POSTOPERATIVE DIAGNOSIS:    Nuclear sclerotic cataract left eye.     PROCEDURE:  Phacoemusification with posterior chamber intraocular lens placement of the left eye  Ultrasound time: Procedure(s) with comments: CATARACT EXTRACTION PHACO AND INTRAOCULAR LENS PLACEMENT (IOC) LEFT  00:51.0  20.3%  10.37 (Left) - ARRIVAL 10:30 PLEASE LEAVE  LENS:   Implant Name Type Inv. Item Serial No. Manufacturer Lot No. LRB No. Used Action  LENS IOL DIOP 20.0 - [id number] Intraocular Lens LENS IOL DIOP 20.0 [id number]  Left 1 Implanted      SURGEON:  Wyonia Hough, MD   ANESTHESIA:  Topical with tetracaine drops and 2% Xylocaine jelly, augmented with 1% preservative-free intracameral lidocaine.    COMPLICATIONS:  None.   DESCRIPTION OF PROCEDURE:  The patient was identified in the holding room and transported to the operating room and placed in the supine position under the operating microscope.  The left eye was identified as the operative eye and it was prepped and draped in the usual sterile ophthalmic fashion.   A 1 millimeter clear-corneal paracentesis was made at the 1:30 position.  0.5 ml of preservative-free 1% lidocaine was injected into the anterior chamber.  The anterior chamber was filled with Viscoat viscoelastic.  A 2.4 millimeter keratome was used to make a near-clear corneal incision at the 10:30 position.  .  A curvilinear capsulorrhexis was made with a cystotome and capsulorrhexis forceps.  Balanced salt solution was used to hydrodissect and hydrodelineate the nucleus.   Phacoemulsification was then used in stop and chop fashion to remove the lens nucleus and epinucleus.  The remaining cortex was then removed using the irrigation and aspiration handpiece. Provisc was then placed into the capsular bag to distend it for lens placement.  A lens  was then injected into the capsular bag.  The remaining viscoelastic was aspirated.   Wounds were hydrated with balanced salt solution.  The anterior chamber was inflated to a physiologic pressure with balanced salt solution.  No wound leaks were noted. Cefuroxime 0.1 ml of a 10mg /ml solution was injected into the anterior chamber for a dose of 1 mg of intracameral antibiotic at the completion of the case.   Timolol and Brimonidine drops were applied to the eye.  ERythromycin ointment was applied and the eye was patched.  The patient was taken to the recovery room in stable condition without complications of anesthesia or surgery.  Carmichael Burdette 06/08/2019, 12:27 PM

## 2019-06-09 ENCOUNTER — Encounter: Payer: Self-pay | Admitting: Ophthalmology

## 2019-07-20 ENCOUNTER — Telehealth: Payer: Self-pay | Admitting: *Deleted

## 2019-07-20 NOTE — Telephone Encounter (Signed)
Copied from [place] [id number]. Topic: General - Inquiry >> Jul 20, 2019 11:57 AM Mathis Bud wrote: Reason for CRM: Patient is requesting a call back from PCP nurse, patient is stating her memory is getting worse.  Patient states she walks into a room and can't remember why she went into the room in the first place. Call back [phone]

## 2019-07-22 NOTE — Telephone Encounter (Signed)
Scheduled pt for virtual on 12/11 to discuss possibly taking aricept.

## 2019-07-29 ENCOUNTER — Ambulatory Visit (INDEPENDENT_AMBULATORY_CARE_PROVIDER_SITE_OTHER): Payer: Medicare Other | Admitting: Internal Medicine

## 2019-07-29 ENCOUNTER — Encounter: Payer: Self-pay | Admitting: Internal Medicine

## 2019-07-29 ENCOUNTER — Other Ambulatory Visit: Payer: Self-pay

## 2019-07-29 DIAGNOSIS — F419 Anxiety disorder, unspecified: Secondary | ICD-10-CM | POA: Diagnosis not present

## 2019-07-29 DIAGNOSIS — F320 Major depressive disorder, single episode, mild: Secondary | ICD-10-CM

## 2019-07-29 DIAGNOSIS — F32A Depression, unspecified: Secondary | ICD-10-CM

## 2019-07-29 DIAGNOSIS — R413 Other amnesia: Secondary | ICD-10-CM

## 2019-07-29 MED ORDER — SERTRALINE HCL 50 MG PO TABS: ORAL_TABLET | ORAL | 1 refills | Status: DC

## 2019-07-29 NOTE — Progress Notes (Signed)
Patient ID: Alicia Mccann, female   DOB: 1932-11-12, 85 y.o.   MRN: [id number]   Virtual Visit via telephone Note  This visit type was conducted due to national recommendations for restrictions regarding the COVID-19 pandemic (e.g. social distancing).  This format is felt to be most appropriate for this patient at this time.  All issues noted in this document were discussed and addressed.  No physical exam was performed (except for noted visual exam findings with Video Visits).   I connected with [name] by telephone and verified that I am speaking with the correct person using two identifiers. Location patient: home Location provider: work  Persons participating in the telephone visit: patient, provider  I discussed the limitations, risks, security and privacy concerns of performing an evaluation and management service by telephone and the availability of in person appointments.  The patient expressed understanding and agreed to proceed.   Reason for visit: work in appt  HPI: Work in appt with concerns regarding increased stress and some issues with problems with memory.  On questioning her, she is staying in secondary to covid restrictions.  Not getting out and seeing people.  This gets her down.  Increased stress related to this.  States she may go into a room and forget why she went into the room. We discussed other possible reasons for memory change.  Discussed trouble focusing and increased stress.  Does feel needs something to help level things out.  No chest pain.  No sob reported.  Eating.  No vomiting. Bowels moving.     ROS: See pertinent positives and negatives per HPI.  Past Medical History:  Diagnosis Date  . Allergic rhinitis   . Anemia   . Anxiety   . Cellulitis   . Cholelithiasis   . Essential hypertension   . History of stress test    a. 12/2008 Ex MV: EF 78%, no ischemia.  . Insomnia   . Osteoarthritis   . Tachycardia   . Valvular heart disease    a.  12/2008 Echo: EF 65%, mild MR;  b. 2/6 SEM RUSB - insignificant murmur, prob Ao Sclerosis.    Past Surgical History:  Procedure Laterality Date  . BACK SURGERY    . BUNIONECTOMY    . CATARACT EXTRACTION W/PHACO Right 05/18/2019   Procedure: CATARACT EXTRACTION PHACO AND INTRAOCULAR LENS PLACEMENT (IOC) RIGHT MALYUGIN  01:06.5  20.6%  13.72;  Surgeon: Lockie Mola, MD;  Location: [facility];  Service: Ophthalmology;  Laterality: Right;  please leave patient arrival 10  . CATARACT EXTRACTION W/PHACO Left 06/08/2019   Procedure: CATARACT EXTRACTION PHACO AND INTRAOCULAR LENS PLACEMENT (IOC) LEFT  00:51.0  20.3%  10.37;  Surgeon: Lockie Mola, MD;  Location: [facility];  Service: Ophthalmology;  Laterality: Left;  ARRIVAL 10:30 PLEASE LEAVE  . CHOLECYSTECTOMY  03/29/2007  . GALLBLADDER SURGERY    . HIP SURGERY     bilateral   . REPLACEMENT TOTAL KNEE Right   . THUMB ARTHROSCOPY      Family History  Problem Relation Age of Onset  . Heart attack Father   . Stroke Father   . Heart disease Father   . CAD Father   . Heart attack Son 69       MI  . Hypertension Son   . Hypertension Mother   . Heart disease Mother   . Stroke Mother   . Breast cancer Neg Hx     SOCIAL HX: reviewed.    Current  Outpatient Medications:  .  acetaminophen (TYLENOL) 500 MG tablet, Take 1,000 mg by mouth every 8 (eight) hours as needed for mild pain or moderate pain., Disp: , Rfl:  .  ALPRAZolam (XANAX) 0.25 MG tablet, Take 1 tablet (0.25 mg total) by mouth at bedtime as needed., Disp: 30 tablet, Rfl: 0 .  amLODipine (NORVASC) 2.5 MG tablet, TAKE ONE TABLET EVERY DAY, Disp: 90 tablet, Rfl: 3 .  aspirin (ASPIRIN 81) 81 MG EC tablet, Take 81 mg by mouth daily. Swallow whole., Disp: , Rfl:  .  atorvastatin (LIPITOR) 10 MG tablet, TAKE ONE TABLET EVERY DAY, Disp: 30 tablet, Rfl: 0 .  cholecalciferol (VITAMIN D) 1000 UNITS tablet, Take 1,000 Units by mouth every other day. , Disp: ,  Rfl:  .  cyanocobalamin 100 MCG tablet, Take 100 mcg by mouth daily., Disp: , Rfl:  .  sertraline (ZOLOFT) 50 MG tablet, Take 1/2 tablet q day for 10 days and then 1 tablet q day, Disp: 30 tablet, Rfl: 1 .  triamterene-hydrochlorothiazide (MAXZIDE-25) 37.5-25 MG tablet, TAKE ONE TABLET EVERY DAY, Disp: 90 tablet, Rfl: 3  EXAM:  GENERAL: alert.  Sounds to be in no acute distress.  Answering questions appropriately.   PSYCH/NEURO: pleasant and cooperative, no obvious depression or anxiety, speech and thought processing grossly intact  ASSESSMENT AND PLAN:  Discussed the following assessment and plan:  Anxiety Increased anxiety as outlined.  Discussed with her today.  Discussed treatment.  She does feel needs something to help level things out.  Start zoloft 1/2 tablet q day x 10 days and then one po q day.  Follow closely.  Schedule f/u soon to reassess.    Mild depression (HCC) Mild depression associated with covid restrictions, etc.  Discussed with her today.  Discussed treatment options.  Start zoloft as directed.  Follow.  Schedule f/u soon to reassess.    Memory changes Discussed possible etiologies.  Will check routine labs including B12 level, etc.  Treat depression and anxiety.  Follow closely.      I discussed the assessment and treatment plan with the patient. The patient was provided an opportunity to ask questions and all were answered. The patient agreed with the plan and demonstrated an understanding of the instructions.   The patient was advised to call back or seek an in-person evaluation if the symptoms worsen or if the condition fails to improve as anticipated.  I provided 23 minutes of non-face-to-face time during this encounter.   Einar Pheasant, MD

## 2019-07-31 ENCOUNTER — Encounter: Payer: Self-pay | Admitting: Internal Medicine

## 2019-07-31 DIAGNOSIS — F339 Major depressive disorder, recurrent, unspecified: Secondary | ICD-10-CM | POA: Insufficient documentation

## 2019-07-31 DIAGNOSIS — R413 Other amnesia: Secondary | ICD-10-CM | POA: Insufficient documentation

## 2019-07-31 DIAGNOSIS — F320 Major depressive disorder, single episode, mild: Secondary | ICD-10-CM | POA: Insufficient documentation

## 2019-07-31 NOTE — Assessment & Plan Note (Signed)
Increased anxiety as outlined.  Discussed with her today.  Discussed treatment.  She does feel needs something to help level things out.  Start zoloft 1/2 tablet q day x 10 days and then one po q day.  Follow closely.  Schedule f/u soon to reassess.

## 2019-07-31 NOTE — Assessment & Plan Note (Signed)
Discussed possible etiologies.  Will check routine labs including B12 level, etc.  Treat depression and anxiety.  Follow closely.

## 2019-07-31 NOTE — Assessment & Plan Note (Signed)
Mild depression associated with covid restrictions, etc.  Discussed with her today.  Discussed treatment options.  Start zoloft as directed.  Follow.  Schedule f/u soon to reassess.

## 2019-08-09 ENCOUNTER — Other Ambulatory Visit (INDEPENDENT_AMBULATORY_CARE_PROVIDER_SITE_OTHER): Payer: Medicare Other

## 2019-08-09 ENCOUNTER — Other Ambulatory Visit: Payer: Self-pay

## 2019-08-09 DIAGNOSIS — E782 Mixed hyperlipidemia: Secondary | ICD-10-CM | POA: Diagnosis not present

## 2019-08-09 DIAGNOSIS — R413 Other amnesia: Secondary | ICD-10-CM

## 2019-08-09 DIAGNOSIS — I10 Essential (primary) hypertension: Secondary | ICD-10-CM | POA: Diagnosis not present

## 2019-08-09 DIAGNOSIS — R739 Hyperglycemia, unspecified: Secondary | ICD-10-CM

## 2019-08-09 LAB — LIPID PANEL
Cholesterol: 127 mg/dL (ref 0–200)
HDL: 46.7 mg/dL (ref >39.00)
LDL Cholesterol: 57 mg/dL (ref 0–99)
NonHDL: 79.98
Total CHOL/HDL Ratio: 3
Triglycerides: 117 mg/dL (ref 0.0–149.0)
VLDL: 23.4 mg/dL (ref 0.0–40.0)

## 2019-08-09 LAB — HEPATIC FUNCTION PANEL
ALT: 16 U/L (ref 0–35)
AST: 16 U/L (ref 0–37)
Albumin: 4.3 g/dL (ref 3.5–5.2)
Alkaline Phosphatase: 65 U/L (ref 39–117)
Bilirubin, Direct: 0.1 mg/dL (ref 0.0–0.3)
Total Bilirubin: 0.7 mg/dL (ref 0.2–1.2)
Total Protein: 6.2 g/dL (ref 6.0–8.3)

## 2019-08-09 LAB — BASIC METABOLIC PANEL
BUN: 24 mg/dL — ABNORMAL HIGH (ref 6–23)
CO2: 25 mEq/L (ref 19–32)
Calcium: 9.8 mg/dL (ref 8.4–10.5)
Chloride: 103 mEq/L (ref 96–112)
Creatinine, Ser: 0.97 mg/dL (ref 0.40–1.20)
GFR: 54.69 mL/min — ABNORMAL LOW (ref >60.00)
Glucose, Bld: 87 mg/dL (ref 70–99)
Potassium: 3.5 mEq/L (ref 3.5–5.1)
Sodium: 139 mEq/L (ref 135–145)

## 2019-08-09 LAB — HEMOGLOBIN A1C: Hgb A1c MFr Bld: 5.9 % (ref 4.6–6.5)

## 2019-08-09 LAB — VITAMIN B12: Vitamin B-12: >1500 pg/mL — ABNORMAL HIGH (ref 211–911)

## 2019-08-15 ENCOUNTER — Other Ambulatory Visit: Payer: Self-pay | Admitting: Cardiovascular Disease

## 2019-08-31 DIAGNOSIS — H6123 Impacted cerumen, bilateral: Secondary | ICD-10-CM | POA: Diagnosis not present

## 2019-08-31 DIAGNOSIS — R220 Localized swelling, mass and lump, head: Secondary | ICD-10-CM | POA: Diagnosis not present

## 2019-09-05 ENCOUNTER — Other Ambulatory Visit: Payer: Self-pay | Admitting: Internal Medicine

## 2019-09-13 ENCOUNTER — Other Ambulatory Visit: Payer: Medicare Other

## 2019-09-14 ENCOUNTER — Ambulatory Visit (INDEPENDENT_AMBULATORY_CARE_PROVIDER_SITE_OTHER): Payer: Medicare PPO | Admitting: Internal Medicine

## 2019-09-14 ENCOUNTER — Other Ambulatory Visit: Payer: Self-pay

## 2019-09-14 ENCOUNTER — Encounter: Payer: Self-pay | Admitting: Internal Medicine

## 2019-09-14 DIAGNOSIS — D649 Anemia, unspecified: Secondary | ICD-10-CM

## 2019-09-14 DIAGNOSIS — F419 Anxiety disorder, unspecified: Secondary | ICD-10-CM | POA: Diagnosis not present

## 2019-09-14 DIAGNOSIS — I10 Essential (primary) hypertension: Secondary | ICD-10-CM

## 2019-09-14 DIAGNOSIS — I700 Atherosclerosis of aorta: Secondary | ICD-10-CM | POA: Diagnosis not present

## 2019-09-14 DIAGNOSIS — I739 Peripheral vascular disease, unspecified: Secondary | ICD-10-CM | POA: Diagnosis not present

## 2019-09-14 DIAGNOSIS — E782 Mixed hyperlipidemia: Secondary | ICD-10-CM | POA: Diagnosis not present

## 2019-09-14 DIAGNOSIS — R739 Hyperglycemia, unspecified: Secondary | ICD-10-CM

## 2019-09-14 DIAGNOSIS — N183 Chronic kidney disease, stage 3 unspecified: Secondary | ICD-10-CM

## 2019-09-14 DIAGNOSIS — F320 Major depressive disorder, single episode, mild: Secondary | ICD-10-CM

## 2019-09-14 DIAGNOSIS — F32A Depression, unspecified: Secondary | ICD-10-CM

## 2019-09-14 NOTE — Progress Notes (Signed)
Patient ID: Alicia Mccann, female   DOB: 02/03/1933, 86 y.o.   MRN: [id number]   Virtual Visit via telephone Note  This visit type was conducted due to national recommendations for restrictions regarding the COVID-19 pandemic (e.g. social distancing).  This format is felt to be most appropriate for this patient at this time.  All issues noted in this document were discussed and addressed.  No physical exam was performed (except for noted visual exam findings with Video Visits).   I connected with Alicia Mccann by telephone and verified that I am speaking with the correct person using two identifiers. Location patient: home Location provider: work  Persons participating in the virtual visit: patient, provider  Tthe limitations, risks, security and privacy concerns of performing an evaluation and management service by telephone and the availability of in person appointments have been discussed. The patient expressed understanding and agreed to proceed.   Reason for visit: scheduled follow up  HPI: She reports she is doing relatively well.  Is s/p cataract surgery.  Increased stress.  Discussed with her today.  On zoloft.  Overall she feels she is handling things relatively well.  No chest pain or sob reported.  No increased cough or congestion.  No abodminal pain or bowel change reported.  Has seen ortho for her hand/fingers.  Discussed f/u if persistent problems.  Sleeping ok.  Saw rheumatology - right knee - injection.  Some discomfort.  Wants to hold on f/u at this time.  Wants to hold on mammogram right now.  Trying to stay in due to covid restrictions.     ROS: See pertinent positives and negatives per HPI.  Past Medical History:  Diagnosis Date  . Allergic rhinitis   . Anemia   . Anxiety   . Cellulitis   . Cholelithiasis   . Essential hypertension   . History of stress test    a. 12/2008 Ex MV: EF 78%, no ischemia.  . Insomnia   . Osteoarthritis   . Tachycardia   . Valvular  heart disease    a. 12/2008 Echo: EF 65%, mild MR;  b. 2/6 SEM RUSB - insignificant murmur, prob Ao Sclerosis.    Past Surgical History:  Procedure Laterality Date  . BACK SURGERY    . BUNIONECTOMY    . CATARACT EXTRACTION W/PHACO Right 05/18/2019   Procedure: CATARACT EXTRACTION PHACO AND INTRAOCULAR LENS PLACEMENT (IOC) RIGHT MALYUGIN  01:06.5  20.6%  13.72;  Surgeon: Leandrew Koyanagi, MD;  Location: [facility];  Service: Ophthalmology;  Laterality: Right;  please leave patient arrival 10  . CATARACT EXTRACTION W/PHACO Left 06/08/2019   Procedure: CATARACT EXTRACTION PHACO AND INTRAOCULAR LENS PLACEMENT (IOC) LEFT  00:51.0  20.3%  10.37;  Surgeon: Leandrew Koyanagi, MD;  Location: [facility];  Service: Ophthalmology;  Laterality: Left;  ARRIVAL 10:30 PLEASE LEAVE  . CHOLECYSTECTOMY  03/29/2007  . GALLBLADDER SURGERY    . HIP SURGERY     bilateral   . REPLACEMENT TOTAL KNEE Right   . THUMB ARTHROSCOPY      Family History  Problem Relation Age of Onset  . Heart attack Father   . Stroke Father   . Heart disease Father   . CAD Father   . Heart attack Son 58       MI  . Hypertension Son   . Hypertension Mother   . Heart disease Mother   . Stroke Mother   . Breast cancer Neg Hx     SOCIAL  HX: reviewed.    Current Outpatient Medications:  .  acetaminophen (TYLENOL) 500 MG tablet, Take 1,000 mg by mouth every 8 (eight) hours as needed for mild pain or moderate pain., Disp: , Rfl:  .  ALPRAZolam (XANAX) 0.25 MG tablet, Take 1 tablet (0.25 mg total) by mouth at bedtime as needed., Disp: 30 tablet, Rfl: 0 .  amLODipine (NORVASC) 2.5 MG tablet, TAKE ONE TABLET EVERY DAY, Disp: 90 tablet, Rfl: 3 .  aspirin (ASPIRIN 81) 81 MG EC tablet, Take 81 mg by mouth daily. Swallow whole., Disp: , Rfl:  .  atorvastatin (LIPITOR) 10 MG tablet, TAKE ONE TABLET EVERY DAY, Disp: 30 tablet, Rfl: 0 .  cholecalciferol (VITAMIN D) 1000 UNITS tablet, Take 1,000 Units by mouth every  other day. , Disp: , Rfl:  .  cyanocobalamin 100 MCG tablet, Take 100 mcg by mouth daily., Disp: , Rfl:  .  sertraline (ZOLOFT) 50 MG tablet, TAKE 1/2 TABLET EVERY DAY FOR 10 DAYS THEN TAKE ONE TABLET EVERY DAY, Disp: 30 tablet, Rfl: 1 .  triamterene-hydrochlorothiazide (MAXZIDE-25) 37.5-25 MG tablet, TAKE ONE TABLET EVERY DAY, Disp: 90 tablet, Rfl: 3  EXAM:  GENERAL: alert. Sounds to be in no acute distress.  Answering questions appropriately.  PSYCH/NEURO: pleasant and cooperative, no obvious depression or anxiety, speech and thought processing grossly intact  ASSESSMENT AND PLAN:  Discussed the following assessment and plan:  Anemia Follow cbc.   Anxiety On zoloft.  Stable.   Aortic atherosclerosis (HCC) On lipitor.   CKD (chronic kidney disease) stage 3, GFR 30-59 ml/min (HCC) Avoid antiinflammatories.  Follow met b.   Essential hypertension Blood pressure has been doing well.  Continue current medication regimen.  Follow pressures.  Follow metabolic panel.   Hyperglycemia Low carb diet and exercise.  Follow met b and a1c.   Mild depression ([place]) On zoloft.  Discussed with her today.  Overall stable.  Follow.   PAD (peripheral artery disease) ([place]) Continue crestor and aspirin.   Mixed hyperlipidemia On lipitor.  Low cholesterol and exercise.  Follow lipid panel and liver function tests.     Orders Placed This Encounter  Procedures  . Hemoglobin A1c    Standing Status:   Future    Standing Expiration Date:   09/17/2020  . Hepatic function panel    Standing Status:   Future    Standing Expiration Date:   09/17/2020  . Lipid panel    Standing Status:   Future    Standing Expiration Date:   09/17/2020  . Basic metabolic panel    Standing Status:   Future    Standing Expiration Date:   09/17/2020    No orders of the defined types were placed in this encounter.    I discussed the assessment and treatment plan with the patient. The patient was provided an  opportunity to ask questions and all were answered. The patient agreed with the plan and demonstrated an understanding of the instructions.   The patient was advised to call back or seek an in-person evaluation if the symptoms worsen or if the condition fails to improve as anticipated.  I provided 25 minutes of non-face-to-face time during this encounter.   Einar Pheasant, MD

## 2019-09-18 ENCOUNTER — Encounter: Payer: Self-pay | Admitting: Internal Medicine

## 2019-09-18 NOTE — Assessment & Plan Note (Signed)
On zoloft.  Discussed with her today.  Overall stable.  Follow.

## 2019-09-18 NOTE — Assessment & Plan Note (Signed)
On zoloft.  Stable.  

## 2019-09-18 NOTE — Assessment & Plan Note (Signed)
Continue crestor and aspirin.  

## 2019-09-18 NOTE — Assessment & Plan Note (Signed)
On lipitor.  Low cholesterol and exercise.  Follow lipid panel and liver function tests.

## 2019-09-18 NOTE — Assessment & Plan Note (Signed)
Low carb diet and exercise.  Follow met b and a1c.  

## 2019-09-18 NOTE — Assessment & Plan Note (Signed)
On lipitor

## 2019-09-18 NOTE — Assessment & Plan Note (Signed)
Blood pressure has been doing well.  Continue current medication regimen.  Follow pressures.  Follow metabolic panel.  

## 2019-09-18 NOTE — Assessment & Plan Note (Signed)
Avoid antiinflammatories.  Follow met b.   

## 2019-09-18 NOTE — Assessment & Plan Note (Signed)
Follow cbc.  

## 2019-11-11 ENCOUNTER — Other Ambulatory Visit: Payer: Self-pay | Admitting: Internal Medicine

## 2019-11-22 ENCOUNTER — Other Ambulatory Visit: Payer: Self-pay

## 2019-11-22 ENCOUNTER — Encounter: Payer: Self-pay | Admitting: Cardiovascular Disease

## 2019-11-22 ENCOUNTER — Ambulatory Visit (INDEPENDENT_AMBULATORY_CARE_PROVIDER_SITE_OTHER): Payer: Medicare PPO | Admitting: Cardiovascular Disease

## 2019-11-22 VITALS — BP 140/76 | HR 86 | Ht 61.0 in | Wt 166.0 lb

## 2019-11-22 DIAGNOSIS — E782 Mixed hyperlipidemia: Secondary | ICD-10-CM | POA: Diagnosis not present

## 2019-11-22 DIAGNOSIS — I2510 Atherosclerotic heart disease of native coronary artery without angina pectoris: Secondary | ICD-10-CM

## 2019-11-22 DIAGNOSIS — I10 Essential (primary) hypertension: Secondary | ICD-10-CM | POA: Diagnosis not present

## 2019-11-22 DIAGNOSIS — I700 Atherosclerosis of aorta: Secondary | ICD-10-CM | POA: Diagnosis not present

## 2019-11-22 DIAGNOSIS — I739 Peripheral vascular disease, unspecified: Secondary | ICD-10-CM | POA: Diagnosis not present

## 2019-11-22 DIAGNOSIS — I38 Endocarditis, valve unspecified: Secondary | ICD-10-CM

## 2019-11-22 NOTE — Progress Notes (Signed)
Cardiology Office Note  Date:  11/22/2019   ID:  Alicia Mccann, DOB 1933/07/03, MRN [id number]  PCP:  Dale Drysdale, MD   Chief Complaint  Patient presents with  . office visit    12 month F/U; Meds verbally reviewed with patient.    HPI:  Alicia Mccann is a 86 year old woman history of  hypertension,  obesity,  osteoarthritis,  previously evaluated for murmur , aortic valve sclerosis Without significant stenosis  Hypertension 12/2008 Ex MV: EF 78%, no ischemia. CT chest May 2019 coronary calcium , moderate in nature LAD Aortic plaque, mild to moderate/arch She presents for routine follow-up of her hypertension and murmur  In follow-up today reports that she is doing relatively well Lives alone Trouble with severe arthritis of her fingers, does not want surgery Completed surgery for cataracts, heading aides  Has  A dog, several walks a day [place], no recent falls No regular exercise program  Lab work reviewed HBA1C: 5.9 Total chol 127, LDL 57   lost her son  likely secondary to acute MI.  Husband with severe heart disease, LVAD, died  lives alone  TKR in early 2019 History of diverticulitis  CT chest May 2019 coronary calcium , moderate in nature LAD Aortic plaque, mild to moderate/arch  EKG personally reviewed by myself on todays visit Shows normal sinus rhythm rate 85 bpm no significant ST or T wave changes nonspecific ST ABN  Other past medical history reviewed Echocardiogram May 2010 showed normal LV systolic function, mild MR and TR  Echocardiogram stress test May 2010 was essentially normal. She exercised for 3 minutes achieved 4.6 METs, peak heart rate 151 beats per minute. No EKG changes concerning for ischemia. Echocardiogram showed no ischemia  Followup note from cardiology April 2011 suggested she have fatigue from low-dose beta blocker. She was on lisinopril 20 mg at that time.  She does report having a family history. Mother was a  smoker, had a stroke in her early 53s. Father had MI in his late 23s was not a smoker  PMH:   has a past medical history of Allergic rhinitis, Anemia, Anxiety, Cellulitis, Cholelithiasis, Essential hypertension, History of stress test, Insomnia, Osteoarthritis, Tachycardia, and Valvular heart disease.  PSH:    Past Surgical History:  Procedure Laterality Date  . BACK SURGERY    . BUNIONECTOMY    . CATARACT EXTRACTION W/PHACO Right 05/18/2019   Procedure: CATARACT EXTRACTION PHACO AND INTRAOCULAR LENS PLACEMENT (IOC) RIGHT MALYUGIN  01:06.5  20.6%  13.72;  Surgeon: Lockie Mola, MD;  Location[facility];  Service: Ophthalmology;  Laterality: Right;  please leave patient arrival 10  . CATARACT EXTRACTION W/PHACO Left 06/08/2019   Procedure: CATARACT EXTRACTION PHACO AND INTRAOCULAR LENS PLACEMENT (IOC) LEFT  00:51.0  20.3%  10.37;  Surgeon: Lockie Mola, MD;  Location: [facility];  Service: Ophthalmology;  Laterality: Left;  ARRIVAL 10:30 PLEASE LEAVE  . CHOLECYSTECTOMY  03/29/2007  . GALLBLADDER SURGERY    . HIP SURGERY     bilateral   . REPLACEMENT TOTAL KNEE Right   . THUMB ARTHROSCOPY      Current Outpatient Medications  Medication Sig Dispense Refill  . acetaminophen (TYLENOL) 500 MG tablet Take 1,000 mg by mouth every 8 (eight) hours as needed for mild pain or moderate pain.    Marland Kitchen ALPRAZolam (XANAX) 0.25 MG tablet Take 1 tablet (0.25 mg total) by mouth at bedtime as needed. 30 tablet 0  . amLODipine (NORVASC) 2.5 MG tablet TAKE  ONE TABLET EVERY DAY 90 tablet 3  . aspirin (ASPIRIN 81) 81 MG EC tablet Take 81 mg by mouth daily. Swallow whole.    Marland Kitchen atorvastatin (LIPITOR) 10 MG tablet TAKE ONE TABLET EVERY DAY 30 tablet 0  . cholecalciferol (VITAMIN D) 1000 UNITS tablet Take 1,000 Units by mouth every other day.     . cyanocobalamin 100 MCG tablet Take 100 mcg by mouth daily.    . sertraline (ZOLOFT) 50 MG tablet TAKE 1/2 TABLET EVERY DAY FOR 10 DAYS  THEN TAKE ONE TABLET EVERY DAY 30 tablet 1  . triamterene-hydrochlorothiazide (MAXZIDE-25) 37.5-25 MG tablet TAKE ONE TABLET EVERY DAY 90 tablet 3   No current facility-administered medications for this visit.     Allergies:   Albumen, egg; Clindamycin/lincomycin; Eggs or egg-derived products; Flagyl [metronidazole]; Levaquin [levofloxacin]; Mobic [meloxicam]; Penicillins; and Sulfa antibiotics   Social History:  The patient  reports that she has never smoked. She has never used smokeless tobacco. She reports current alcohol use. She reports that she does not use drugs.   Family History:   family history includes CAD in her father; Heart attack in her father; Heart attack (age of onset: 41) in her son; Heart disease in her father and mother; Hypertension in her mother and son; Stroke in her father and mother.    Review of Systems: Review of Systems  Constitutional: Negative.   Respiratory: Negative.   Cardiovascular: Negative.   Gastrointestinal: Negative.   Musculoskeletal: Negative.   Neurological: Negative.   Psychiatric/Behavioral: Negative.   All other systems reviewed and are negative.   PHYSICAL EXAM: VS:  BP 140/76 (BP Location: Left Arm, Patient Position: Sitting, Cuff Size: Large)   Pulse 86   Ht 5\' 1"  (1.549 m)   Wt 166 lb (75.3 kg)   SpO2 97%   BMI 31.37 kg/m  , BMI Body mass index is 31.37 kg/m. Constitutional:  oriented to person, place, and time. No distress.  HENT:  Head: Grossly normal Eyes:  no discharge. No scleral icterus.  Neck: No JVD, no carotid bruits  Cardiovascular: Regular rate and rhythm, no murmurs appreciated Pulmonary/Chest: Clear to auscultation bilaterally, no wheezes or rails Abdominal: Soft.  no distension.  no tenderness.  Musculoskeletal: Normal range of motion Neurological:  normal muscle tone. Coordination normal. No atrophy Skin: Skin warm and dry Psychiatric: normal affect, pleasant  Recent Labs: 05/02/2019: Hemoglobin 12.5;  Platelets 295.0; TSH 1.97 08/09/2019: ALT 16; BUN 24; Creatinine, Ser 0.97; Potassium 3.5; Sodium 139    Lipid Panel Lab Results  Component Value Date   CHOL 127 08/09/2019   HDL 46.70 08/09/2019   LDLCALC 57 08/09/2019   TRIG 117.0 08/09/2019  On Crestor.  She has since stopped the medication    Wt Readings from Last 3 Encounters:  11/22/19 166 lb (75.3 kg)  09/14/19 162 lb (73.5 kg)  06/08/19 165 lb (74.8 kg)      ASSESSMENT AND PLAN:  Valvular heart disease - Plan: EKG 12-Lead Minimal aortic valve disease, likely sclerosis without stenosis Minimal murmur on exam today, no further work-up at this time  Coronary calcification on CT scan Currently with no symptoms of angina. No further workup at this time. Continue current medication regimen.  Mixed hyperlipidemia - Plan: EKG 12-Lead CT images revealing disease as detailed above Numbers at goal  Essential hypertension - Plan: EKG 12-Lead Blood pressure is well controlled on today's visit. No changes made to the medications.  Edema extremities - Plan: EKG 12-Lead Minimal symptoms  on today's visit  Aortic atherosclerosis Seen on CT scan images Stressed the importance of aggressive lipid management   Total encounter time more than 25 minutes  Greater than 50% was spent in counseling and coordination of care with the patient   Disposition:   F/U 12 months    Orders Placed This Encounter  Procedures  . EKG 12-Lead     Signed, Esmond Plants, M.D., Ph.D. 11/22/2019  [place], [place]

## 2019-11-22 NOTE — Patient Instructions (Signed)

## 2019-12-12 ENCOUNTER — Other Ambulatory Visit (INDEPENDENT_AMBULATORY_CARE_PROVIDER_SITE_OTHER): Payer: Medicare PPO

## 2019-12-12 ENCOUNTER — Other Ambulatory Visit: Payer: Self-pay

## 2019-12-12 DIAGNOSIS — E782 Mixed hyperlipidemia: Secondary | ICD-10-CM | POA: Diagnosis not present

## 2019-12-12 DIAGNOSIS — I10 Essential (primary) hypertension: Secondary | ICD-10-CM | POA: Diagnosis not present

## 2019-12-12 DIAGNOSIS — R739 Hyperglycemia, unspecified: Secondary | ICD-10-CM

## 2019-12-12 LAB — BASIC METABOLIC PANEL WITH GFR
BUN: 33 mg/dL — ABNORMAL HIGH (ref 6–23)
CO2: 28 meq/L (ref 19–32)
Calcium: 9.8 mg/dL (ref 8.4–10.5)
Chloride: 103 meq/L (ref 96–112)
Creatinine, Ser: 1.14 mg/dL (ref 0.40–1.20)
GFR: 45.35 mL/min — ABNORMAL LOW
Glucose, Bld: 91 mg/dL (ref 70–99)
Potassium: 3.8 meq/L (ref 3.5–5.1)
Sodium: 135 meq/L (ref 135–145)

## 2019-12-12 LAB — HEPATIC FUNCTION PANEL
ALT: 15 U/L (ref 0–35)
AST: 16 U/L (ref 0–37)
Albumin: 4.4 g/dL (ref 3.5–5.2)
Alkaline Phosphatase: 72 U/L (ref 39–117)
Bilirubin, Direct: 0.1 mg/dL (ref 0.0–0.3)
Total Bilirubin: 0.6 mg/dL (ref 0.2–1.2)
Total Protein: 6.7 g/dL (ref 6.0–8.3)

## 2019-12-12 LAB — LIPID PANEL
Cholesterol: 126 mg/dL (ref 0–200)
HDL: 39.9 mg/dL (ref >39.00)
LDL Cholesterol: 55 mg/dL (ref 0–99)
NonHDL: 85.65
Total CHOL/HDL Ratio: 3
Triglycerides: 152 mg/dL — ABNORMAL HIGH (ref 0.0–149.0)
VLDL: 30.4 mg/dL (ref 0.0–40.0)

## 2019-12-12 LAB — HEMOGLOBIN A1C: Hgb A1c MFr Bld: 6.1 % (ref 4.6–6.5)

## 2019-12-14 ENCOUNTER — Other Ambulatory Visit: Payer: Self-pay

## 2019-12-14 ENCOUNTER — Ambulatory Visit: Payer: Medicare PPO | Admitting: Internal Medicine

## 2019-12-14 ENCOUNTER — Other Ambulatory Visit: Payer: Self-pay | Admitting: Cardiovascular Disease

## 2019-12-14 DIAGNOSIS — F320 Major depressive disorder, single episode, mild: Secondary | ICD-10-CM | POA: Diagnosis not present

## 2019-12-14 DIAGNOSIS — M159 Polyosteoarthritis, unspecified: Secondary | ICD-10-CM

## 2019-12-14 DIAGNOSIS — R2681 Unsteadiness on feet: Secondary | ICD-10-CM

## 2019-12-14 DIAGNOSIS — F419 Anxiety disorder, unspecified: Secondary | ICD-10-CM

## 2019-12-14 DIAGNOSIS — E782 Mixed hyperlipidemia: Secondary | ICD-10-CM

## 2019-12-14 DIAGNOSIS — D649 Anemia, unspecified: Secondary | ICD-10-CM | POA: Diagnosis not present

## 2019-12-14 DIAGNOSIS — F32A Depression, unspecified: Secondary | ICD-10-CM

## 2019-12-14 DIAGNOSIS — I700 Atherosclerosis of aorta: Secondary | ICD-10-CM

## 2019-12-14 DIAGNOSIS — N183 Chronic kidney disease, stage 3 unspecified: Secondary | ICD-10-CM

## 2019-12-14 DIAGNOSIS — I10 Essential (primary) hypertension: Secondary | ICD-10-CM | POA: Diagnosis not present

## 2019-12-14 DIAGNOSIS — R739 Hyperglycemia, unspecified: Secondary | ICD-10-CM

## 2019-12-14 NOTE — Progress Notes (Signed)
Patient ID: Alicia Mccann, female   DOB: 17-Jun-1933, 86 y.o.   MRN: [id number]   Subjective:    Patient ID: Alicia Mccann, female    DOB: May 10, 1933, 86 y.o.   MRN: [id number]  HPI This visit occurred during the SARS-CoV-2 public health emergency.  Safety protocols were in place, including screening questions prior to the visit, additional usage of staff PPE, and extensive cleaning of exam room while observing appropriate contact time as indicated for disinfecting solutions.  Patient here for a scheduled follow up.  On zoloft.  Increased stress.  Discussed with her today.  No chest pain or sob reported.  She does walk her dog 2-3x/day.  No acid reflux reported.  No abdominal pain or bowel change reported.  Does report increased joint pain.  Has previously seen rheumatology.  S/p right knee injection previously.  Discussed f/u with rheumatology she is also concerned regarding her balance.  Discussed PT evaluation and treatment.  Saw 11/22/19 - for f/u valvular heart disease, htn and aortic atherosclerosis.  Stable.  Recommended f/u in 12 months.    Past Medical History:  Diagnosis Date  . Allergic rhinitis   . Anemia   . Anxiety   . Cellulitis   . Cholelithiasis   . Essential hypertension   . History of stress test    a. 12/2008 Ex MV: EF 78%, no ischemia.  . Insomnia   . Osteoarthritis   . Tachycardia   . Valvular heart disease    a. 12/2008 Echo: EF 65%, mild MR;  b. 2/6 SEM RUSB - insignificant murmur, prob Ao Sclerosis.   Past Surgical History:  Procedure Laterality Date  . BACK SURGERY    . BUNIONECTOMY    . CATARACT EXTRACTION W/PHACO Right 05/18/2019   Procedure: CATARACT EXTRACTION PHACO AND INTRAOCULAR LENS PLACEMENT (IOC) RIGHT MALYUGIN  01:06.5  20.6%  13.72;  Surgeon: Leandrew Koyanagi, MD;  Location: [place];  Service: Ophthalmology;  Laterality: Right;  please leave patient arrival 10  . CATARACT EXTRACTION W/PHACO Left 06/08/2019   Procedure: CATARACT  EXTRACTION PHACO AND INTRAOCULAR LENS PLACEMENT (IOC) LEFT  00:51.0  20.3%  10.37;  Surgeon: Leandrew Koyanagi, MD;  Location: [place];  Service: Ophthalmology;  Laterality: Left;  ARRIVAL 10:30 PLEASE LEAVE  . CHOLECYSTECTOMY  03/29/2007  . GALLBLADDER SURGERY    . HIP SURGERY     bilateral   . REPLACEMENT TOTAL KNEE Right   . THUMB ARTHROSCOPY     Family History  Problem Relation Age of Onset  . Heart attack Father   . Stroke Father   . Heart disease Father   . CAD Father   . Heart attack Son 30       MI  . Hypertension Son   . Hypertension Mother   . Heart disease Mother   . Stroke Mother   . Breast cancer Neg Hx    Social History   Socioeconomic History  . Marital status: Widowed    Spouse name: Not on file  . Number of children: Not on file  . Years of education: Not on file  . Highest education level: Not on file  Occupational History  . Not on file  Tobacco Use  . Smoking status: Never Smoker  . Smokeless tobacco: Never Used  Substance and Sexual Activity  . Alcohol use: Yes    Comment: 2 oz QOD  . Drug use: No  . Sexual activity: Not Currently  Other Topics Concern  .  Not on file  Social History Narrative  . Not on file   Social Determinants of Health   Financial Resource Strain:   . Difficulty of Paying Living Expenses:   Food Insecurity:   . Worried About Charity fundraiser in the Last Year:   . Arboriculturist in the Last Year:   Transportation Needs:   . Film/video editor (Medical):   Marland Kitchen Lack of Transportation (Non-Medical):   Physical Activity:   . Days of Exercise per Week:   . Minutes of Exercise per Session:   Stress:   . Feeling of Stress :   Social Connections:   . Frequency of Communication with Friends and Family:   . Frequency of Social Gatherings with Friends and Family:   . Attends Religious Services:   . Active Member of Clubs or Organizations:   . Attends Archivist Meetings:   Marland Kitchen Marital Status:      Outpatient Encounter Medications as of 12/14/2019  Medication Sig  . acetaminophen (TYLENOL) 500 MG tablet Take 1,000 mg by mouth every 8 (eight) hours as needed for mild pain or moderate pain.  Marland Kitchen ALPRAZolam (XANAX) 0.25 MG tablet Take 1 tablet (0.25 mg total) by mouth at bedtime as needed.  Marland Kitchen amLODipine (NORVASC) 2.5 MG tablet TAKE ONE TABLET EVERY DAY  . aspirin (ASPIRIN 81) 81 MG EC tablet Take 81 mg by mouth daily. Swallow whole.  . cholecalciferol (VITAMIN D) 1000 UNITS tablet Take 1,000 Units by mouth every other day.   . cyanocobalamin 100 MCG tablet Take 100 mcg by mouth daily.  . sertraline (ZOLOFT) 50 MG tablet TAKE 1/2 TABLET EVERY DAY FOR 10 DAYS THEN TAKE ONE TABLET EVERY DAY  . triamterene-hydrochlorothiazide (MAXZIDE-25) 37.5-25 MG tablet TAKE ONE TABLET EVERY DAY  . [DISCONTINUED] atorvastatin (LIPITOR) 10 MG tablet TAKE ONE TABLET EVERY DAY   No facility-administered encounter medications on file as of 12/14/2019.    Review of Systems  Constitutional: Negative for appetite change and unexpected weight change.  HENT: Negative for congestion and sinus pressure.   Respiratory: Negative for cough, chest tightness and shortness of breath.   Cardiovascular: Negative for chest pain, palpitations and leg swelling.  Gastrointestinal: Negative for abdominal pain, diarrhea, nausea and vomiting.  Genitourinary: Negative for difficulty urinating and dysuria.  Musculoskeletal: Negative for myalgias.       Joint pain - hands.    Skin: Negative for color change and rash.  Neurological: Negative for dizziness, light-headedness and headaches.  Psychiatric/Behavioral: Negative for agitation and dysphoric mood.       Increased stress as outlined.         Objective:    Physical Exam Constitutional:      General: She is not in acute distress.    Appearance: Normal appearance.  HENT:     Head: Normocephalic and atraumatic.     Right Ear: External ear normal.     Left Ear:  External ear normal.  Eyes:     General: No scleral icterus.       Right eye: No discharge.        Left eye: No discharge.     Conjunctiva/sclera: Conjunctivae normal.  Neck:     Thyroid: No thyromegaly.  Cardiovascular:     Rate and Rhythm: Normal rate and regular rhythm.  Pulmonary:     Effort: No respiratory distress.     Breath sounds: Normal breath sounds. No wheezing.  Abdominal:     General: Bowel  sounds are normal.     Palpations: Abdomen is soft.     Tenderness: There is no abdominal tenderness.  Musculoskeletal:        General: No swelling or tenderness.     Cervical back: Neck supple. No tenderness.  Lymphadenopathy:     Cervical: No cervical adenopathy.  Skin:    Findings: No erythema or rash.  Neurological:     Mental Status: She is alert.  Psychiatric:        Mood and Affect: Mood normal.        Behavior: Behavior normal.     BP 140/78   Pulse 81   Temp 97.8 F (36.6 C)   Resp 16   Ht '5\' 1"'  (1.549 m)   Wt 164 lb 6.4 oz (74.6 kg)   SpO2 97%   BMI 31.06 kg/m  Wt Readings from Last 3 Encounters:  12/14/19 164 lb 6.4 oz (74.6 kg)  11/22/19 166 lb (75.3 kg)  09/14/19 162 lb (73.5 kg)     Lab Results  Component Value Date   WBC 9.0 05/02/2019   HGB 12.5 05/02/2019   HCT 38.0 05/02/2019   PLT 295.0 05/02/2019   GLUCOSE 91 12/12/2019   CHOL 126 12/12/2019   TRIG 152.0 (H) 12/12/2019   HDL 39.90 12/12/2019   LDLDIRECT 66.0 09/07/2018   LDLCALC 55 12/12/2019   ALT 15 12/12/2019   AST 16 12/12/2019   NA 135 12/12/2019   K 3.8 12/12/2019   CL 103 12/12/2019   CREATININE 1.14 12/12/2019   BUN 33 (H) 12/12/2019   CO2 28 12/12/2019   TSH 1.97 05/02/2019   INR 0.9 01/16/2009   HGBA1C 6.1 12/12/2019       Assessment & Plan:   Problem List Items Addressed This Visit    Anemia    Follow cbc.       Anxiety    Discussed.  On zoloft.        Aortic atherosclerosis (HCC)    On lipitor.        CKD (chronic kidney disease) stage 3, GFR  30-59 ml/min    Avoid antiinflammatories.  Follow met b.       Relevant Orders   Basic metabolic panel   Essential hypertension    Blood pressure has been under reasonable control.  Continue amlodipine and triam/hctz.  Follow pressures.  Follow metabolic panel.       Hyperglycemia    Low carb diet and exercise.  Follow met b and a1c.       Mild depression ([place])    On zoloft.  Follow.        Mixed hyperlipidemia    On lipitor.  Low cholesterol diet and exercise.  Follow lipid panel and liver function tests.        Osteoarthritis    Has seen Dr Jefm Bryant previously.  Increased joint pain - hands.  Request referral back to Dr Jefm Bryant.        Relevant Orders   Ambulatory referral to Rheumatology   Unsteady gait    Refer to PT for evaluation and treatment.        Relevant Orders   Ambulatory referral to Physical Therapy       Einar Pheasant, MD

## 2019-12-15 NOTE — Telephone Encounter (Signed)
Please advise if ok to refill Atorvastatin 10 mg qd. Last filled by PCP.

## 2019-12-18 ENCOUNTER — Encounter: Payer: Self-pay | Admitting: Internal Medicine

## 2019-12-18 DIAGNOSIS — R2681 Unsteadiness on feet: Secondary | ICD-10-CM | POA: Insufficient documentation

## 2019-12-18 NOTE — Assessment & Plan Note (Signed)
Discussed.  On zoloft.

## 2019-12-18 NOTE — Assessment & Plan Note (Signed)
Avoid antiinflammatories.  Follow met b.   

## 2019-12-18 NOTE — Assessment & Plan Note (Signed)
Low carb diet and exercise.  Follow met b and a1c.

## 2019-12-18 NOTE — Assessment & Plan Note (Signed)
Refer to PT for evaluation and treatment

## 2019-12-18 NOTE — Assessment & Plan Note (Signed)
On lipitor

## 2019-12-18 NOTE — Assessment & Plan Note (Signed)
Blood pressure has been under reasonable control.  Continue amlodipine and triam/hctz.  Follow pressures.  Follow metabolic panel.

## 2019-12-18 NOTE — Assessment & Plan Note (Signed)
On lipitor.  Low cholesterol diet and exercise.  Follow lipid panel and liver function tests.   

## 2019-12-18 NOTE — Assessment & Plan Note (Signed)
Follow cbc.  

## 2019-12-18 NOTE — Assessment & Plan Note (Signed)
On zoloft.  Follow.  

## 2019-12-18 NOTE — Assessment & Plan Note (Signed)
Has seen Dr Gavin Potters previously.  Increased joint pain - hands.  Request referral back to Dr Gavin Potters.

## 2020-01-03 DIAGNOSIS — H2589 Other age-related cataract: Secondary | ICD-10-CM | POA: Diagnosis not present

## 2020-01-09 ENCOUNTER — Other Ambulatory Visit: Payer: Self-pay | Admitting: Internal Medicine

## 2020-01-10 DIAGNOSIS — M8949 Other hypertrophic osteoarthropathy, multiple sites: Secondary | ICD-10-CM | POA: Diagnosis not present

## 2020-01-10 DIAGNOSIS — M1812 Unilateral primary osteoarthritis of first carpometacarpal joint, left hand: Secondary | ICD-10-CM | POA: Diagnosis not present

## 2020-01-10 DIAGNOSIS — G5602 Carpal tunnel syndrome, left upper limb: Secondary | ICD-10-CM | POA: Diagnosis not present

## 2020-01-11 ENCOUNTER — Other Ambulatory Visit (INDEPENDENT_AMBULATORY_CARE_PROVIDER_SITE_OTHER): Payer: Medicare PPO

## 2020-01-11 ENCOUNTER — Other Ambulatory Visit: Payer: Self-pay

## 2020-01-11 DIAGNOSIS — N183 Chronic kidney disease, stage 3 unspecified: Secondary | ICD-10-CM

## 2020-01-12 LAB — BASIC METABOLIC PANEL
BUN: 37 mg/dL — ABNORMAL HIGH (ref 6–23)
CO2: 27 mEq/L (ref 19–32)
Calcium: 9.7 mg/dL (ref 8.4–10.5)
Chloride: 102 mEq/L (ref 96–112)
Creatinine, Ser: 1.04 mg/dL (ref 0.40–1.20)
GFR: 50.41 mL/min — ABNORMAL LOW (ref >60.00)
Glucose, Bld: 111 mg/dL — ABNORMAL HIGH (ref 70–99)
Potassium: 3.8 mEq/L (ref 3.5–5.1)
Sodium: 138 mEq/L (ref 135–145)

## 2020-01-13 ENCOUNTER — Encounter: Payer: Self-pay | Admitting: Internal Medicine

## 2020-01-31 DIAGNOSIS — R202 Paresthesia of skin: Secondary | ICD-10-CM | POA: Diagnosis not present

## 2020-01-31 DIAGNOSIS — R200 Anesthesia of skin: Secondary | ICD-10-CM | POA: Diagnosis not present

## 2020-02-07 ENCOUNTER — Telehealth: Payer: Self-pay | Admitting: Internal Medicine

## 2020-02-07 ENCOUNTER — Ambulatory Visit: Payer: Medicare PPO | Admitting: Physical Therapy

## 2020-02-07 NOTE — Telephone Encounter (Signed)
Pt called in stated that she wanted to go over result with Dr.Scott  From Dr.Kernodle

## 2020-02-10 NOTE — Telephone Encounter (Signed)
Appt scheduled with Dr Lorin Picket to discuss seeing a surgeon for her carpal tunnel

## 2020-02-10 NOTE — Telephone Encounter (Signed)
LMTCB

## 2020-02-13 ENCOUNTER — Ambulatory Visit: Payer: Medicare PPO | Admitting: Physical Therapy

## 2020-02-14 ENCOUNTER — Other Ambulatory Visit: Payer: Self-pay | Admitting: Internal Medicine

## 2020-02-14 DIAGNOSIS — I10 Essential (primary) hypertension: Secondary | ICD-10-CM

## 2020-02-17 ENCOUNTER — Other Ambulatory Visit: Payer: Self-pay

## 2020-02-17 ENCOUNTER — Telehealth (INDEPENDENT_AMBULATORY_CARE_PROVIDER_SITE_OTHER): Payer: Medicare PPO | Admitting: Internal Medicine

## 2020-02-17 ENCOUNTER — Ambulatory Visit: Payer: Medicare PPO

## 2020-02-17 ENCOUNTER — Encounter: Payer: Self-pay | Admitting: Internal Medicine

## 2020-02-17 DIAGNOSIS — R739 Hyperglycemia, unspecified: Secondary | ICD-10-CM

## 2020-02-17 DIAGNOSIS — F32A Depression, unspecified: Secondary | ICD-10-CM

## 2020-02-17 DIAGNOSIS — G5600 Carpal tunnel syndrome, unspecified upper limb: Secondary | ICD-10-CM

## 2020-02-17 DIAGNOSIS — D649 Anemia, unspecified: Secondary | ICD-10-CM

## 2020-02-17 DIAGNOSIS — F338 Other recurrent depressive disorders: Secondary | ICD-10-CM | POA: Diagnosis not present

## 2020-02-17 DIAGNOSIS — M25562 Pain in left knee: Secondary | ICD-10-CM | POA: Diagnosis not present

## 2020-02-17 DIAGNOSIS — F320 Major depressive disorder, single episode, mild: Secondary | ICD-10-CM | POA: Diagnosis not present

## 2020-02-17 DIAGNOSIS — I739 Peripheral vascular disease, unspecified: Secondary | ICD-10-CM | POA: Diagnosis not present

## 2020-02-17 DIAGNOSIS — I10 Essential (primary) hypertension: Secondary | ICD-10-CM | POA: Diagnosis not present

## 2020-02-17 DIAGNOSIS — N183 Chronic kidney disease, stage 3 unspecified: Secondary | ICD-10-CM | POA: Diagnosis not present

## 2020-02-17 DIAGNOSIS — I700 Atherosclerosis of aorta: Secondary | ICD-10-CM

## 2020-02-17 DIAGNOSIS — R2681 Unsteadiness on feet: Secondary | ICD-10-CM

## 2020-02-17 NOTE — Progress Notes (Signed)
Patient ID: Alicia Mccann, female   DOB: May 04, 1933, 86 y.o.   MRN: [id number]   Virtual Visit via telephone Note  This visit type was conducted due to national recommendations for restrictions regarding the COVID-19 pandemic (e.g. social distancing).  This format is felt to be most appropriate for this patient at this time.  All issues noted in this document were discussed and addressed.  No physical exam was performed (except for noted visual exam findings with Video Visits).   I connected with Alicia Mccann by telephone and verified that I am speaking with the correct person using two identifiers. Location patient: home Location provider: work Persons participating in the telephone visit: patient, provider  The limitations, risks, security and privacy concerns of performing an evaluation and management service by telephone and the availability of in person appointments have been discussed.  It has been also discussed with the patient that there may be a patient responsible charge related to this service. The patient has expressed understanding and has agreed to proceed.   Reason for visit: scheduled follow up  HPI: Scheduled follow up.  Has been having problems with carpal tunnel.  Planning to see ortho (Dr Peggye Ley) 02/23/20 - severe CTS.  Left knee started bothering her.  Discussed f/u with ortho.  She wants to get her hand straightened out first.  Does have some days where she feels "blah".  Sunny days - feels better.  Does have treadmill in her home.  Discussed exercise.  No chest pain or sob reported.  Eating.  No nausea or vomiting.  Bowels moving.  Drinking water.     ROS: See pertinent positives and negatives per HPI.  Past Medical History:  Diagnosis Date  . Allergic rhinitis   . Anemia   . Anxiety   . Cellulitis   . Cholelithiasis   . Essential hypertension   . History of stress test    a. 12/2008 Ex MV: EF 78%, no ischemia.  . Insomnia   . Osteoarthritis   . Tachycardia    . Valvular heart disease    a. 12/2008 Echo: EF 65%, mild MR;  b. 2/6 SEM RUSB - insignificant murmur, prob Ao Sclerosis.    Past Surgical History:  Procedure Laterality Date  . BACK SURGERY    . BUNIONECTOMY    . CATARACT EXTRACTION W/PHACO Right 05/18/2019   Procedure: CATARACT EXTRACTION PHACO AND INTRAOCULAR LENS PLACEMENT (IOC) RIGHT MALYUGIN  01:06.5  20.6%  13.72;  Surgeon: Leandrew Koyanagi, MD;  Location: [place];  Service: Ophthalmology;  Laterality: Right;  please leave patient arrival 10  . CATARACT EXTRACTION W/PHACO Left 06/08/2019   Procedure: CATARACT EXTRACTION PHACO AND INTRAOCULAR LENS PLACEMENT (IOC) LEFT  00:51.0  20.3%  10.37;  Surgeon: Leandrew Koyanagi, MD;  Location: [place];  Service: Ophthalmology;  Laterality: Left;  ARRIVAL 10:30 PLEASE LEAVE  . CHOLECYSTECTOMY  03/29/2007  . GALLBLADDER SURGERY    . HIP SURGERY     bilateral   . REPLACEMENT TOTAL KNEE Right   . THUMB ARTHROSCOPY      Family History  Problem Relation Age of Onset  . Heart attack Father   . Stroke Father   . Heart disease Father   . CAD Father   . Heart attack Son 41       MI  . Hypertension Son   . Hypertension Mother   . Heart disease Mother   . Stroke Mother   . Breast cancer Neg Hx  SOCIAL HX: reviewed.    Current Outpatient Medications:  .  acetaminophen (TYLENOL) 500 MG tablet, Take 1,000 mg by mouth every 8 (eight) hours as needed for mild pain or moderate pain., Disp: , Rfl:  .  ALPRAZolam (XANAX) 0.25 MG tablet, Take 1 tablet (0.25 mg total) by mouth at bedtime as needed., Disp: 30 tablet, Rfl: 0 .  amLODipine (NORVASC) 2.5 MG tablet, TAKE ONE TABLET EVERY DAY, Disp: 90 tablet, Rfl: 3 .  aspirin (ASPIRIN 81) 81 MG EC tablet, Take 81 mg by mouth daily. Swallow whole., Disp: , Rfl:  .  atorvastatin (LIPITOR) 10 MG tablet, TAKE ONE TABLET EVERY DAY, Disp: 90 tablet, Rfl: 3 .  cholecalciferol (VITAMIN D) 1000 UNITS tablet, Take 1,000 Units  by mouth every other day. , Disp: , Rfl:  .  cyanocobalamin 100 MCG tablet, Take 100 mcg by mouth daily., Disp: , Rfl:  .  sertraline (ZOLOFT) 50 MG tablet, TAKE 1/2 TABLET EVERY DAY FOR 10 DAYS THEN TAKE ONE TABLET EVERY DAY, Disp: 30 tablet, Rfl: 1 .  triamterene-hydrochlorothiazide (MAXZIDE-25) 37.5-25 MG tablet, TAKE ONE TABLET EVERY DAY, Disp: 90 tablet, Rfl: 3  EXAM:  GENERAL: alert.  Answering questions appropriately.  Sounds to be in no acute distress.    PSYCH/NEURO: pleasant and cooperative, no obvious depression or anxiety, speech and thought processing grossly intact  ASSESSMENT AND PLAN:  Discussed the following assessment and plan:  Unsteady gait Had referred to PT.  She did not go.  Wants to get her hand straightened out first.  Follow.    Seasonal affective disorder Discussed today.  Follow.    PAD (peripheral artery disease) ([place]) Continue crestor and aspirin.    Mild depression ([place]) On zoloft.  Overall stable.  Discussed today.  Follow.    Left knee pain Needs f/u with ortho.  Wants to hold at this time.  Follow.    Hyperglycemia Low carb diet and exercise.  Follow met b and a1c.   Essential hypertension Blood pressure has been under reasonable control.  Continue triam/hctz and amlodipine.  Follow pressures.  Follow metabolic panel.   CKD (chronic kidney disease) stage 3, GFR 30-59 ml/min Avoid antiinflammatories.  Follow metabolic panel.   Carpal tunnel syndrome Planning f/u with ortho 02/23/20 to discuss treatment options.    Aortic atherosclerosis (HCC) Continue lipitor.    Anemia Follow cbc.    Orders Placed This Encounter  Procedures  . CBC with Differential/Platelet    Standing Status:   Future    Standing Expiration Date:   02/18/2021  . Hemoglobin A1c    Standing Status:   Future    Standing Expiration Date:   02/18/2021  . Hepatic function panel    Standing Status:   Future    Standing Expiration Date:   02/18/2021  . Lipid panel     Standing Status:   Future    Standing Expiration Date:   02/18/2021  . TSH    Standing Status:   Future    Standing Expiration Date:   02/18/2021  . Basic metabolic panel    Standing Status:   Future    Standing Expiration Date:   02/18/2021     I discussed the assessment and treatment plan with the patient. The patient was provided an opportunity to ask questions and all were answered. The patient agreed with the plan and demonstrated an understanding of the instructions.   The patient was advised to call back or seek an in-person evaluation if  the symptoms worsen or if the condition fails to improve as anticipated.  I provided 25 minutes of non-face-to-face time during this encounter.   Einar Pheasant, MD

## 2020-02-19 ENCOUNTER — Encounter: Payer: Self-pay | Admitting: Internal Medicine

## 2020-02-19 NOTE — Assessment & Plan Note (Signed)
Follow cbc.  

## 2020-02-19 NOTE — Assessment & Plan Note (Signed)
Low carb diet and exercise.  Follow met b and a1c.  

## 2020-02-19 NOTE — Assessment & Plan Note (Signed)
Continue crestor and aspirin.  

## 2020-02-19 NOTE — Assessment & Plan Note (Signed)
Needs f/u with ortho.  Wants to hold at this time.  Follow.

## 2020-02-19 NOTE — Assessment & Plan Note (Signed)
Planning f/u with ortho 02/23/20 to discuss treatment options.

## 2020-02-19 NOTE — Assessment & Plan Note (Signed)
Blood pressure has been under reasonable control.  Continue triam/hctz and amlodipine.  Follow pressures.  Follow metabolic panel.

## 2020-02-19 NOTE — Assessment & Plan Note (Signed)
Avoid antiinflammatories.  Follow metabolic panel.  

## 2020-02-19 NOTE — Assessment & Plan Note (Signed)
Continue lipitor  ?

## 2020-02-19 NOTE — Assessment & Plan Note (Signed)
Discussed today.  Follow.

## 2020-02-19 NOTE — Assessment & Plan Note (Signed)
On zoloft.  Overall stable.  Discussed today.  Follow.

## 2020-02-19 NOTE — Assessment & Plan Note (Signed)
Had referred to PT.  She did not go.  Wants to get her hand straightened out first.  Follow.

## 2020-02-21 ENCOUNTER — Ambulatory Visit: Payer: Medicare PPO | Admitting: Physical Therapy

## 2020-02-23 ENCOUNTER — Ambulatory Visit: Payer: Medicare PPO | Admitting: Physical Therapy

## 2020-02-28 ENCOUNTER — Ambulatory Visit: Payer: Medicare PPO | Admitting: Physical Therapy

## 2020-03-01 ENCOUNTER — Ambulatory Visit: Payer: Medicare PPO | Admitting: Physical Therapy

## 2020-03-06 ENCOUNTER — Ambulatory Visit: Payer: Medicare PPO | Admitting: Physical Therapy

## 2020-03-06 DIAGNOSIS — G5603 Carpal tunnel syndrome, bilateral upper limbs: Secondary | ICD-10-CM | POA: Diagnosis not present

## 2020-03-13 ENCOUNTER — Ambulatory Visit: Payer: Medicare PPO | Admitting: Physical Therapy

## 2020-03-15 ENCOUNTER — Ambulatory Visit: Payer: Medicare PPO | Admitting: Physical Therapy

## 2020-03-17 ENCOUNTER — Other Ambulatory Visit: Payer: Self-pay | Admitting: Internal Medicine

## 2020-03-20 ENCOUNTER — Ambulatory Visit: Payer: Medicare PPO | Admitting: Physical Therapy

## 2020-03-22 ENCOUNTER — Ambulatory Visit: Payer: Medicare PPO | Admitting: Physical Therapy

## 2020-03-27 ENCOUNTER — Ambulatory Visit: Payer: Medicare PPO | Admitting: Physical Therapy

## 2020-03-29 ENCOUNTER — Ambulatory Visit: Payer: Medicare PPO | Admitting: Physical Therapy

## 2020-03-30 ENCOUNTER — Ambulatory Visit: Payer: Medicare Other

## 2020-04-03 ENCOUNTER — Ambulatory Visit: Payer: Medicare PPO | Admitting: Physical Therapy

## 2020-04-03 ENCOUNTER — Ambulatory Visit (INDEPENDENT_AMBULATORY_CARE_PROVIDER_SITE_OTHER): Payer: Medicare PPO

## 2020-04-03 VITALS — Ht 61.0 in | Wt 162.0 lb

## 2020-04-03 DIAGNOSIS — Z0000 Encounter for general adult medical examination without abnormal findings: Secondary | ICD-10-CM | POA: Diagnosis not present

## 2020-04-03 NOTE — Progress Notes (Addendum)
Subjective:   Alicia Mccann is a 86 y.o. female who presents for Medicare Annual (Subsequent) preventive examination.  Review of Systems    No ROS.  Medicare Wellness Virtual Visit.   Cardiac Risk Factors include: advanced age (>9355men, 7>65 women);hypertension     Objective:    Today's Vitals   04/03/20 1134  Weight: 162 lb (73.5 kg)  Height: 5\' 1"  (1.549 m)   Body mass index is 30.61 kg/m.  Advanced Directives 04/03/2020 06/08/2019 05/18/2019 03/30/2019 07/21/2018 03/16/2018 01/26/2018  Does Patient Have a Medical Advance Directive? Yes Yes Yes Yes Yes Yes Yes  Type of Estate agentAdvance Directive Healthcare Power of LakesideAttorney;Living will Healthcare Power of [place]Attorney;Living will Healthcare Power of Cut OffAttorney;Living will Living will;Healthcare Power of State Street Corporationttorney Healthcare Power of [place]Attorney;Living will Healthcare Power of [place]lanAttorney;Living will Healthcare Power of [place]Attorney;Living will  Does patient want to make changes to medical advance directive? No - Patient declined No - Patient declined No - Patient declined No - Patient declined - No - Patient declined -  Copy of Healthcare Power of Attorney in Chart? No - copy requested Yes - validated most recent copy scanned in chart (See row information) Yes - validated most recent copy scanned in chart (See row information) No - copy requested - No - copy requested -  Would patient like information on creating a medical advance directive? - - - - No - Patient declined - No - Patient declined    Current Medications (verified) Outpatient Encounter Medications as of 04/03/2020  Medication Sig  . acetaminophen (TYLENOL) 500 MG tablet Take 1,000 mg by mouth every 8 (eight) hours as needed for mild pain or moderate pain.  Marland Kitchen. ALPRAZolam (XANAX) 0.25 MG tablet Take 1 tablet (0.25 mg total) by mouth at bedtime as needed.  Marland Kitchen. amLODipine (NORVASC) 2.5 MG tablet TAKE ONE TABLET EVERY DAY  . aspirin (ASPIRIN 81) 81 MG EC tablet Take 81 mg by mouth daily. Swallow whole.    Marland Kitchen. atorvastatin (LIPITOR) 10 MG tablet TAKE ONE TABLET EVERY DAY  . cholecalciferol (VITAMIN D) 1000 UNITS tablet Take 1,000 Units by mouth every other day.   . cyanocobalamin 100 MCG tablet Take 100 mcg by mouth daily.  . sertraline (ZOLOFT) 50 MG tablet TAKE 1/2 TABLET EVERY DAY FOR 10 DAYS THEN TAKE ONE TABLET EVERY DAY  . triamterene-hydrochlorothiazide (MAXZIDE-25) 37.5-25 MG tablet TAKE ONE TABLET EVERY DAY   No facility-administered encounter medications on file as of 04/03/2020.    Allergies (verified) Albumen, egg; Clindamycin/lincomycin; Eggs or egg-derived products; Flagyl [metronidazole]; Levaquin [levofloxacin]; Mobic [meloxicam]; Penicillins; and Sulfa antibiotics   History: Past Medical History:  Diagnosis Date  . Allergic rhinitis   . Anemia   . Anxiety   . Cellulitis   . Cholelithiasis   . Essential hypertension   . History of stress test    a. 12/2008 Ex MV: EF 78%, no ischemia.  . Insomnia   . Osteoarthritis   . Tachycardia   . Valvular heart disease    a. 12/2008 Echo: EF 65%, mild MR;  b. 2/6 SEM RUSB - insignificant murmur, prob Ao Sclerosis.   Past Surgical History:  Procedure Laterality Date  . BACK SURGERY    . BUNIONECTOMY    . CATARACT EXTRACTION W/PHACO Right 05/18/2019   Procedure: CATARACT EXTRACTION PHACO AND INTRAOCULAR LENS PLACEMENT (IOC) RIGHT MALYUGIN  01:06.5  20.6%  13.72;  Surgeon: Lockie MolaBrasington, Chadwick, MD;  [facility];  Service: Ophthalmology;  Laterality: Right;  please leave patient  arrival 10  . CATARACT EXTRACTION W/PHACO Left 06/08/2019   Procedure: CATARACT EXTRACTION PHACO AND INTRAOCULAR LENS PLACEMENT (IOC) LEFT  00:51.0  20.3%  10.37;  Surgeon: Lockie Mola, MD;  Location: [facility];  Service: Ophthalmology;  Laterality: Left;  ARRIVAL 10:30 PLEASE LEAVE  . CHOLECYSTECTOMY  03/29/2007  . GALLBLADDER SURGERY    . HIP SURGERY     bilateral   . REPLACEMENT TOTAL KNEE Right   . THUMB ARTHROSCOPY      Family History  Problem Relation Age of Onset  . Heart attack Father   . Stroke Father   . Heart disease Father   . CAD Father   . Heart attack Son 42       MI  . Hypertension Son   . Hypertension Mother   . Heart disease Mother   . Stroke Mother   . Breast cancer Neg Hx    Social History   Socioeconomic History  . Marital status: Widowed    Spouse name: Not on file  . Number of children: Not on file  . Years of education: Not on file  . Highest education level: Not on file  Occupational History  . Not on file  Tobacco Use  . Smoking status: Never Smoker  . Smokeless tobacco: Never Used  Vaping Use  . Vaping Use: Never used  Substance and Sexual Activity  . Alcohol use: Yes    Comment: 2 oz QOD  . Drug use: No  . Sexual activity: Not Currently  Other Topics Concern  . Not on file  Social History Narrative  . Not on file   Social Determinants of Health   Financial Resource Strain: Low Risk   . Difficulty of Paying Living Expenses: Not hard at all  Food Insecurity: No Food Insecurity  . Worried About Programme researcher, broadcasting/film/video in the Last Year: Never true  . Ran Out of Food in the Last Year: Never true  Transportation Needs: No Transportation Needs  . Lack of Transportation (Medical): No  . Lack of Transportation (Non-Medical): No  Physical Activity: Insufficiently Active  . Days of Exercise per Week: 4 days  . Minutes of Exercise per Session: 20 min  Stress: No Stress Concern Present  . Feeling of Stress : Not at all  Social Connections: Unknown  . Frequency of Communication with Friends and Family: Not on file  . Frequency of Social Gatherings with Friends and Family: Not on file  . Attends Religious Services: Not on file  . Active Member of Clubs or Organizations: Not on file  . Attends Banker Meetings: Not on file  . Marital Status: Widowed    Tobacco Counseling Counseling given: Not Answered   Clinical Intake:  Pre-visit preparation  completed: Yes        Diabetes: No  How often do you need to have someone help you when you read instructions, pamphlets, or other written materials from your doctor or pharmacy?: 1 - Never  Interpreter Needed?: No      Activities of Daily Living In your present state of health, do you have any difficulty performing the following activities: 04/03/2020 06/08/2019  Hearing? Y N  Comment Hearing aids -  Vision? N N  Difficulty concentrating or making decisions? N N  Walking or climbing stairs? N N  Dressing or bathing? N N  Doing errands, shopping? N -  Preparing Food and eating ? N -  Using the Toilet? N -  In the  past six months, have you accidently leaked urine? N -  Do you have problems with loss of bowel control? N -  Managing your Medications? N -  Managing your Finances? N -  Housekeeping or managing your Housekeeping? N -  Some recent data might be hidden    Patient Care Team: Dale Tuttle, MD as PCP - General (Internal Medicine)  Indicate any recent Medical Services you may have received from other than [facility] providers in the past year (date may be approximate).     Assessment:   This is a routine wellness examination for [place].  I connected with Elaina today by telephone and verified that I am speaking with the correct person using two identifiers. Location patient: home Location provider: work Persons participating in the virtual visit: patient, Engineer, civil (consulting).    I discussed the limitations, risks, security and privacy concerns of performing an evaluation and management service by telephone and the availability of in person appointments. The patient expressed understanding and verbally consented to this telephonic visit.    Interactive audio and video telecommunications were attempted between this provider and patient, however failed, due to patient having technical difficulties OR patient did not have access to video capability.  We continued and  completed visit with audio only.  Some vital signs may be absent or patient reported.   Hearing/Vision screen  Hearing Screening   125Hz  250Hz  500Hz  1000Hz  2000Hz  3000Hz  4000Hz  6000Hz  8000Hz   Right ear:           Left ear:           Comments: Hearing aids  Vision Screening Comments: Followed by [facility] Wears corrective lenses Cataract extraction, bilateral Visual acuity not assessed, virtual visit.  They have seen their ophthalmologist in the last 12 months.    Dietary issues and exercise activities discussed: Current Exercise Habits: Home exercise routine, Type of exercise: walking, Time (Minutes): 20, Frequency (Times/Week): 4, Weekly Exercise (Minutes/Week): 80, Intensity: Mild  Goals      Patient Stated   .  DIET - INCREASE WATER INTAKE (pt-stated)      Stay hydrated    .  Weight (lb) < 155 lb (70.3 kg) (pt-stated)      Snack smart  Portion control      Other   .  Exercise 150 minutes per week (moderate activity)      Depression Screen PHQ 2/9 Scores 04/03/2020 12/14/2019 03/30/2019 03/16/2018 05/08/2017 11/11/2016 06/07/2015  PHQ - 2 Score 1 5 1  0 0 0 0  PHQ- 9 Score - 8 - - 0 3 -    Fall Risk Fall Risk  04/03/2020 05/04/2019 03/30/2019 03/16/2018 05/08/2017  Falls in the past year? 0 0 0 No No  Number falls in past yr: 0 - - - -  Follow up Falls evaluation completed Falls evaluation completed - - -   Handrails in use when climbing stairs? Yes  Home free of loose throw rugs in walkways, pet beds, electrical cords, etc? Yes  Adequate lighting in your home to reduce risk of falls? Yes   ASSISTIVE DEVICES UTILIZED TO PREVENT FALLS:  Life alert? No . Cell phone on person when leaving home? Yes Use of a cane, walker or w/c? Yes  Grab bars in the bathroom? Yes  Shower chair or bench in shower? Yes  Elevated toilet seat or a handicapped toilet? Yes   TIMED UP AND GO:  Was the test performed? No . Virtual visit.  Cognitive Function: MMSE - Mini  Mental  State Exam 03/16/2018  Orientation to time 5  Orientation to Place 5  Registration 3  Attention/ Calculation 5  Recall 3  Language- name 2 objects 2  Language- repeat 1  Language- follow 3 step command 3  Language- read & follow direction 1  Write a sentence 1  Copy design 1  Total score 30     6CIT Screen 04/03/2020 03/30/2019  What Year? 0 points 0 points  What month? 0 points 0 points  What time? - 0 points  Count back from 20 - 0 points  Months in reverse 0 points 0 points  Repeat phrase 0 points 0 points  Total Score - 0    Immunizations Immunization History  Administered Date(s) Administered  . Influenza Split 05/16/2010, 07/22/2012  . Influenza, High Dose Seasonal PF 06/09/2014, 06/07/2015, 05/08/2017, 06/01/2019  . Influenza,inj,Quad PF,6+ Mos 05/18/2013  . Influenza,inj,quad, With Preservative 06/18/2018  . Influenza-Unspecified 06/11/2016, 06/18/2018  . Pneumococcal Conjugate-13 06/09/2014  . Pneumococcal Polysaccharide-23 06/18/2004  . Td 05/24/2007  . Zoster 01/28/2008    TDAP status: Due, Education has been provided regarding the importance of this vaccine. Advised may receive this vaccine at local pharmacy or Health Dept. Aware to provide a copy of the vaccination record if obtained from local pharmacy or Health Dept. Verbalized acceptance and understanding. Deferred.   Health Maintenance Health Maintenance  Topic Date Due  . COVID-19 Vaccine (1) Never done  . TETANUS/TDAP  05/23/2017  . INFLUENZA VACCINE  03/18/2020  . DEXA SCAN  Completed  . PNA vac Low Risk Adult  Completed    Dental Screening: Recommended annual dental exams for proper oral hygiene  Community Resource Referral / Chronic Care Management: CRR required this visit?  No   CCM required this visit?  No      Plan:   Keep all routine maintenance appointments.   Next scheduled lab 04/20/20 @ 10:00  Cpe 04/25/20 @ 10:30  I have personally reviewed and noted the following in the  patient's chart:   . Medical and social history . Use of alcohol, tobacco or illicit drugs  . Current medications and supplements . Functional ability and status . Nutritional status . Physical activity . Advanced directives . List of other physicians . Hospitalizations, surgeries, and ER visits in previous 12 months . Vitals . Screenings to include cognitive, depression, and falls . Referrals and appointments  In addition, I have reviewed and discussed with patient certain preventive protocols, quality metrics, and best practice recommendations. A written personalized care plan for preventive services as well as general preventive health recommendations were provided to patient via mychart.     Ashok Pall, LPN   12/01/6061     Reviewed above information.  Agree with assessment and plan.   Dr Lorin Picket

## 2020-04-03 NOTE — Patient Instructions (Addendum)
[name] , Thank you for taking time to come for your Medicare Wellness Visit. I appreciate your ongoing commitment to your health goals. Please review the following plan we discussed and let me know if I can assist you in the future.   These are the goals we discussed: Goals      Patient Stated   .  DIET - INCREASE WATER INTAKE (pt-stated)      Stay hydrated    .  Weight (lb) < 155 lb (70.3 kg) (pt-stated)      Snack smart  Portion control      Other   .  Exercise 150 minutes per week (moderate activity)       This is a list of the screening recommended for you and due dates:  Health Maintenance  Topic Date Due  . COVID-19 Vaccine (1) Never done  . Tetanus Vaccine  05/23/2017  . Flu Shot  03/18/2020  . DEXA scan (bone density measurement)  Completed  . Pneumonia vaccines  Completed     Immunizations Immunization History  Administered Date(s) Administered  . Influenza Split 05/16/2010, 07/22/2012  . Influenza, High Dose Seasonal PF 06/09/2014, 06/07/2015, 05/08/2017, 06/01/2019  . Influenza,inj,Quad PF,6+ Mos 05/18/2013  . Influenza,inj,quad, With Preservative 06/18/2018  . Influenza-Unspecified 06/11/2016, 06/18/2018  . Pneumococcal Conjugate-13 06/09/2014  . Pneumococcal Polysaccharide-23 06/18/2004  . Td 05/24/2007  . Zoster 01/28/2008   Keep all routine maintenance appointments.   Next scheduled lab 04/20/20 @ 10:00  Cpe 04/25/20 @ 10:30  Advanced directives: End of life planning; Advance aging; Advanced directives discussed.  Copy of current HCPOA/Living Will requested.    Conditions/risks identified: none new  Follow up in one year for your annual wellness visit.   Preventive Care 6 Years and Older, Female Preventive care refers to lifestyle choices and visits with your health care provider that can promote health and wellness. What does preventive care include?  A yearly physical exam. This is also called an annual well check.  Dental exams once or  twice a year.  Routine eye exams. Ask your health care provider how often you should have your eyes checked.  Personal lifestyle choices, including:  Daily care of your teeth and gums.  Regular physical activity.  Eating a healthy diet.  Avoiding tobacco and drug use.  Limiting alcohol use.  Practicing safe sex.  Taking low-dose aspirin every day.  Taking vitamin and mineral supplements as recommended by your health care provider. What happens during an annual well check? The services and screenings done by your health care provider during your annual well check will depend on your age, overall health, lifestyle risk factors, and family history of disease. Counseling  Your health care provider may ask you questions about your:  Alcohol use.  Tobacco use.  Drug use.  Emotional well-being.  Home and relationship well-being.  Sexual activity.  Eating habits.  History of falls.  Memory and ability to understand (cognition).  Work and work Astronomer.  Reproductive health. Screening  You may have the following tests or measurements:  Height, weight, and BMI.  Blood pressure.  Lipid and cholesterol levels. These may be checked every 5 years, or more frequently if you are over 22 years old.  Skin check.  Lung cancer screening. You may have this screening every year starting at age 74 if you have a 30-pack-year history of smoking and currently smoke or have quit within the past 15 years.  Fecal occult blood test (FOBT) of the stool.  You may have this test every year starting at age 75.  Flexible sigmoidoscopy or colonoscopy. You may have a sigmoidoscopy every 5 years or a colonoscopy every 10 years starting at age 68.  Hepatitis C blood test.  Hepatitis B blood test.  Sexually transmitted disease (STD) testing.  Diabetes screening. This is done by checking your blood sugar (glucose) after you have not eaten for a while (fasting). You may have this done  every 1-3 years.  Bone density scan. This is done to screen for osteoporosis. You may have this done starting at age 10.  Mammogram. This may be done every 1-2 years. Talk to your health care provider about how often you should have regular mammograms. Talk with your health care provider about your test results, treatment options, and if necessary, the need for more tests. Vaccines  Your health care provider may recommend certain vaccines, such as:  Influenza vaccine. This is recommended every year.  Tetanus, diphtheria, and acellular pertussis (Tdap, Td) vaccine. You may need a Td booster every 10 years.  Zoster vaccine. You may need this after age 83.  Pneumococcal 13-valent conjugate (PCV13) vaccine. One dose is recommended after age 99.  Pneumococcal polysaccharide (PPSV23) vaccine. One dose is recommended after age 90. Talk to your health care provider about which screenings and vaccines you need and how often you need them. This information is not intended to replace advice given to you by your health care provider. Make sure you discuss any questions you have with your health care provider. Document Released: 08/31/2015 Document Revised: 04/23/2016 Document Reviewed: 06/05/2015 Elsevier Interactive Patient Education  2017 [employer].  Fall Prevention in the Home Falls can cause injuries. They can happen to people of all ages. There are many things you can do to make your home safe and to help prevent falls. What can I do on the outside of my home?  Regularly fix the edges of walkways and driveways and fix any cracks.  Remove anything that might make you trip as you walk through a door, such as a raised step or threshold.  Trim any bushes or trees on the path to your home.  Use bright outdoor lighting.  Clear any walking paths of anything that might make someone trip, such as rocks or tools.  Regularly check to see if handrails are loose or broken. Make sure that both  sides of any steps have handrails.  Any raised decks and porches should have guardrails on the edges.  Have any leaves, snow, or ice cleared regularly.  Use sand or salt on walking paths during winter.  Clean up any spills in your garage right away. This includes oil or grease spills. What can I do in the bathroom?  Use night lights.  Install grab bars by the toilet and in the tub and shower. Do not use towel bars as grab bars.  Use non-skid mats or decals in the tub or shower.  If you need to sit down in the shower, use a plastic, non-slip stool.  Keep the floor dry. Clean up any water that spills on the floor as soon as it happens.  Remove soap buildup in the tub or shower regularly.  Attach bath mats securely with double-sided non-slip rug tape.  Do not have throw rugs and other things on the floor that can make you trip. What can I do in the bedroom?  Use night lights.  Make sure that you have a light by your bed that  is easy to reach.  Do not use any sheets or blankets that are too big for your bed. They should not hang down onto the floor.  Have a firm chair that has side arms. You can use this for support while you get dressed.  Do not have throw rugs and other things on the floor that can make you trip. What can I do in the kitchen?  Clean up any spills right away.  Avoid walking on wet floors.  Keep items that you use a lot in easy-to-reach places.  If you need to reach something above you, use a strong step stool that has a grab bar.  Keep electrical cords out of the way.  Do not use floor polish or wax that makes floors slippery. If you must use wax, use non-skid floor wax.  Do not have throw rugs and other things on the floor that can make you trip. What can I do with my stairs?  Do not leave any items on the stairs.  Make sure that there are handrails on both sides of the stairs and use them. Fix handrails that are broken or loose. Make sure that  handrails are as long as the stairways.  Check any carpeting to make sure that it is firmly attached to the stairs. Fix any carpet that is loose or worn.  Avoid having throw rugs at the top or bottom of the stairs. If you do have throw rugs, attach them to the floor with carpet tape.  Make sure that you have a light switch at the top of the stairs and the bottom of the stairs. If you do not have them, ask someone to add them for you. What else can I do to help prevent falls?  Wear shoes that:  Do not have high heels.  Have rubber bottoms.  Are comfortable and fit you well.  Are closed at the toe. Do not wear sandals.  If you use a stepladder:  Make sure that it is fully opened. Do not climb a closed stepladder.  Make sure that both sides of the stepladder are locked into place.  Ask someone to hold it for you, if possible.  Clearly mark and make sure that you can see:  Any grab bars or handrails.  First and last steps.  Where the edge of each step is.  Use tools that help you move around (mobility aids) if they are needed. These include:  Canes.  Walkers.  Scooters.  Crutches.  Turn on the lights when you go into a dark area. Replace any light bulbs as soon as they burn out.  Set up your furniture so you have a clear path. Avoid moving your furniture around.  If any of your floors are uneven, fix them.  If there are any pets around you, be aware of where they are.  Review your medicines with your doctor. Some medicines can make you feel dizzy. This can increase your chance of falling. Ask your doctor what other things that you can do to help prevent falls. This information is not intended to replace advice given to you by your health care provider. Make sure you discuss any questions you have with your health care provider. Document Released: 05/31/2009 Document Revised: 01/10/2016 Document Reviewed: 09/08/2014 Elsevier Interactive Patient Education  2017  [employer].

## 2020-04-05 ENCOUNTER — Ambulatory Visit: Payer: Medicare PPO | Admitting: Physical Therapy

## 2020-04-10 ENCOUNTER — Ambulatory Visit: Payer: Medicare PPO | Admitting: Physical Therapy

## 2020-04-12 ENCOUNTER — Ambulatory Visit: Payer: Medicare PPO | Admitting: Physical Therapy

## 2020-04-20 ENCOUNTER — Other Ambulatory Visit: Payer: Medicare PPO

## 2020-04-20 ENCOUNTER — Other Ambulatory Visit (INDEPENDENT_AMBULATORY_CARE_PROVIDER_SITE_OTHER): Payer: Medicare PPO

## 2020-04-20 ENCOUNTER — Other Ambulatory Visit: Payer: Self-pay

## 2020-04-20 DIAGNOSIS — R739 Hyperglycemia, unspecified: Secondary | ICD-10-CM | POA: Diagnosis not present

## 2020-04-20 DIAGNOSIS — I700 Atherosclerosis of aorta: Secondary | ICD-10-CM | POA: Diagnosis not present

## 2020-04-20 DIAGNOSIS — I10 Essential (primary) hypertension: Secondary | ICD-10-CM

## 2020-04-20 LAB — BASIC METABOLIC PANEL
BUN: 31 mg/dL — ABNORMAL HIGH (ref 6–23)
CO2: 26 mEq/L (ref 19–32)
Calcium: 10.2 mg/dL (ref 8.4–10.5)
Chloride: 103 mEq/L (ref 96–112)
Creatinine, Ser: 1.04 mg/dL (ref 0.40–1.20)
GFR: 50.38 mL/min — ABNORMAL LOW (ref >60.00)
Glucose, Bld: 89 mg/dL (ref 70–99)
Potassium: 3.6 mEq/L (ref 3.5–5.1)
Sodium: 139 mEq/L (ref 135–145)

## 2020-04-20 LAB — CBC WITH DIFFERENTIAL/PLATELET
Basophils Absolute: 0.1 10*3/uL (ref 0.0–0.1)
Basophils Relative: 1.3 % (ref 0.0–3.0)
Eosinophils Absolute: 0.3 10*3/uL (ref 0.0–0.7)
Eosinophils Relative: 4.1 % (ref 0.0–5.0)
HCT: 39 % (ref 36.0–46.0)
Hemoglobin: 12.8 g/dL (ref 12.0–15.0)
Lymphocytes Relative: 28.1 % (ref 12.0–46.0)
Lymphs Abs: 2.2 10*3/uL (ref 0.7–4.0)
MCHC: 32.9 g/dL (ref 30.0–36.0)
MCV: 89.7 fl (ref 78.0–100.0)
Monocytes Absolute: 0.6 10*3/uL (ref 0.1–1.0)
Monocytes Relative: 7 % (ref 3.0–12.0)
Neutro Abs: 4.8 10*3/uL (ref 1.4–7.7)
Neutrophils Relative %: 59.5 % (ref 43.0–77.0)
Platelets: 302 10*3/uL (ref 150.0–400.0)
RBC: 4.35 Mil/uL (ref 3.87–5.11)
RDW: 12.6 % (ref 11.5–15.5)
WBC: 8 10*3/uL (ref 4.0–10.5)

## 2020-04-20 LAB — HEPATIC FUNCTION PANEL
ALT: 15 U/L (ref 0–35)
AST: 14 U/L (ref 0–37)
Albumin: 4.4 g/dL (ref 3.5–5.2)
Alkaline Phosphatase: 73 U/L (ref 39–117)
Bilirubin, Direct: 0.1 mg/dL (ref 0.0–0.3)
Total Bilirubin: 0.5 mg/dL (ref 0.2–1.2)
Total Protein: 6.4 g/dL (ref 6.0–8.3)

## 2020-04-20 LAB — LIPID PANEL
Cholesterol: 123 mg/dL (ref 0–200)
HDL: 39.2 mg/dL (ref >39.00)
LDL Cholesterol: 51 mg/dL (ref 0–99)
NonHDL: 83.56
Total CHOL/HDL Ratio: 3
Triglycerides: 162 mg/dL — ABNORMAL HIGH (ref 0.0–149.0)
VLDL: 32.4 mg/dL (ref 0.0–40.0)

## 2020-04-20 LAB — HEMOGLOBIN A1C: Hgb A1c MFr Bld: 6.1 % (ref 4.6–6.5)

## 2020-04-20 LAB — TSH: TSH: 2.42 u[IU]/mL (ref 0.35–4.50)

## 2020-04-25 ENCOUNTER — Encounter: Payer: Medicare PPO | Admitting: Internal Medicine

## 2020-04-25 ENCOUNTER — Other Ambulatory Visit: Payer: Self-pay

## 2020-04-30 DIAGNOSIS — Z961 Presence of intraocular lens: Secondary | ICD-10-CM | POA: Diagnosis not present

## 2020-05-08 ENCOUNTER — Telehealth: Payer: Self-pay | Admitting: Internal Medicine

## 2020-05-08 MED ORDER — SERTRALINE HCL 50 MG PO TABS: 50.0000 mg | ORAL_TABLET | Freq: Every day | ORAL | 1 refills | Status: DC

## 2020-05-08 NOTE — Telephone Encounter (Signed)
Patient called in wanted to discuss vaccines and booster shot ,shingles shot  Wanted a call back.

## 2020-05-08 NOTE — Telephone Encounter (Signed)
Patient called in need refill on sertraline (ZOLOFT) 50 MG tablet she only has one left

## 2020-05-11 NOTE — Telephone Encounter (Signed)
LMTCB

## 2020-05-28 DIAGNOSIS — G8929 Other chronic pain: Secondary | ICD-10-CM | POA: Diagnosis not present

## 2020-05-28 DIAGNOSIS — M25562 Pain in left knee: Secondary | ICD-10-CM | POA: Diagnosis not present

## 2020-05-30 DIAGNOSIS — H903 Sensorineural hearing loss, bilateral: Secondary | ICD-10-CM | POA: Diagnosis not present

## 2020-05-30 NOTE — Telephone Encounter (Signed)
Patient stated that she has already had flu shot. Getting booster. Would like to move appt on 11/3 to an appt later in the day.

## 2020-05-30 NOTE — Telephone Encounter (Signed)
LMTCB

## 2020-05-31 NOTE — Telephone Encounter (Signed)
Appt moved

## 2020-06-11 DIAGNOSIS — Z961 Presence of intraocular lens: Secondary | ICD-10-CM | POA: Diagnosis not present

## 2020-06-11 DIAGNOSIS — H538 Other visual disturbances: Secondary | ICD-10-CM | POA: Diagnosis not present

## 2020-06-11 DIAGNOSIS — H52203 Unspecified astigmatism, bilateral: Secondary | ICD-10-CM | POA: Diagnosis not present

## 2020-06-20 ENCOUNTER — Other Ambulatory Visit: Payer: Self-pay | Admitting: Internal Medicine

## 2020-06-20 ENCOUNTER — Encounter: Payer: Medicare PPO | Admitting: Internal Medicine

## 2020-06-20 DIAGNOSIS — Z1231 Encounter for screening mammogram for malignant neoplasm of breast: Secondary | ICD-10-CM

## 2020-06-22 ENCOUNTER — Other Ambulatory Visit: Payer: Self-pay

## 2020-06-22 ENCOUNTER — Ambulatory Visit
Admission: RE | Admit: 2020-06-22 | Discharge: 2020-06-22 | Disposition: A | Discharge status: Home / Self Care | Payer: Medicare PPO | Source: Ambulatory Visit | Attending: Internal Medicine | Admitting: Internal Medicine

## 2020-06-22 DIAGNOSIS — Z1231 Encounter for screening mammogram for malignant neoplasm of breast: Secondary | ICD-10-CM | POA: Insufficient documentation

## 2020-06-29 ENCOUNTER — Other Ambulatory Visit: Payer: Self-pay

## 2020-06-29 ENCOUNTER — Ambulatory Visit: Payer: Medicare PPO | Admitting: Internal Medicine

## 2020-06-29 ENCOUNTER — Encounter: Payer: Self-pay | Admitting: Internal Medicine

## 2020-06-29 DIAGNOSIS — N183 Chronic kidney disease, stage 3 unspecified: Secondary | ICD-10-CM | POA: Diagnosis not present

## 2020-06-29 DIAGNOSIS — I10 Essential (primary) hypertension: Secondary | ICD-10-CM

## 2020-06-29 DIAGNOSIS — G5600 Carpal tunnel syndrome, unspecified upper limb: Secondary | ICD-10-CM | POA: Diagnosis not present

## 2020-06-29 DIAGNOSIS — D649 Anemia, unspecified: Secondary | ICD-10-CM

## 2020-06-29 DIAGNOSIS — F320 Major depressive disorder, single episode, mild: Secondary | ICD-10-CM | POA: Diagnosis not present

## 2020-06-29 DIAGNOSIS — E782 Mixed hyperlipidemia: Secondary | ICD-10-CM

## 2020-06-29 DIAGNOSIS — F419 Anxiety disorder, unspecified: Secondary | ICD-10-CM | POA: Diagnosis not present

## 2020-06-29 DIAGNOSIS — R739 Hyperglycemia, unspecified: Secondary | ICD-10-CM

## 2020-06-29 DIAGNOSIS — H9190 Unspecified hearing loss, unspecified ear: Secondary | ICD-10-CM | POA: Diagnosis not present

## 2020-06-29 DIAGNOSIS — I700 Atherosclerosis of aorta: Secondary | ICD-10-CM | POA: Diagnosis not present

## 2020-06-29 DIAGNOSIS — F32A Depression, unspecified: Secondary | ICD-10-CM

## 2020-06-29 NOTE — Progress Notes (Signed)
Patient ID: Alicia Mccann, female   DOB: 22-Nov-1932, 86 y.o.   MRN: [id number]   Subjective:    Patient ID: ALAYSSA FLINCHUM, female    DOB: February 08, 1933, 86 y.o.   MRN: [id number]  HPI This visit occurred during the SARS-CoV-2 public health emergency.  Safety protocols were in place, including screening questions prior to the visit, additional usage of staff PPE, and extensive cleaning of exam room while observing appropriate contact time as indicated for disinfecting solutions.  Patient here for a scheduled follow up.  Increased stress.  Recently had cataract surgery.  Had continued issues after the surgery.  Now wearing readers and feels is doing well with these.  Has hearing aids and trying to get adjusted to these.  Have helped some with hearing.  Having increased pain - hands - CTS.  Seeing Dr Peggye Ley.  Planning f/u with her.  Discussed increased stress.  She does walk her dog twice a day.  No chest pain or sob reported.  No abdominal pain or bowel change reported.     Past Medical History:  Diagnosis Date  . Allergic rhinitis   . Anemia   . Anxiety   . Cellulitis   . Cholelithiasis   . Essential hypertension   . History of stress test    a. 12/2008 Ex MV: EF 78%, no ischemia.  . Insomnia   . Osteoarthritis   . Tachycardia   . Valvular heart disease    a. 12/2008 Echo: EF 65%, mild MR;  b. 2/6 SEM RUSB - insignificant murmur, prob Ao Sclerosis.   Past Surgical History:  Procedure Laterality Date  . BACK SURGERY    . BUNIONECTOMY    . CATARACT EXTRACTION W/PHACO Right 05/18/2019   Procedure: CATARACT EXTRACTION PHACO AND INTRAOCULAR LENS PLACEMENT (IOC) RIGHT MALYUGIN  01:06.5  20.6%  13.72;  Surgeon: Leandrew Koyanagi, MD;  Location: [facility];  Service: Ophthalmology;  Laterality: Right;  please leave patient arrival 10  . CATARACT EXTRACTION W/PHACO Left 06/08/2019   Procedure: CATARACT EXTRACTION PHACO AND INTRAOCULAR LENS PLACEMENT (IOC) LEFT  00:51.0  20.3%   10.37;  Surgeon: Leandrew Koyanagi, MD;  Location: [facility];  Service: Ophthalmology;  Laterality: Left;  ARRIVAL 10:30 PLEASE LEAVE  . CHOLECYSTECTOMY  03/29/2007  . GALLBLADDER SURGERY    . HIP SURGERY     bilateral   . REPLACEMENT TOTAL KNEE Right   . THUMB ARTHROSCOPY     Family History  Problem Relation Age of Onset  . Heart attack Father   . Stroke Father   . Heart disease Father   . CAD Father   . Heart attack Son 83       MI  . Hypertension Son   . Hypertension Mother   . Heart disease Mother   . Stroke Mother   . Breast cancer Neg Hx    Social History   Socioeconomic History  . Marital status: Widowed    Spouse name: Not on file  . Number of children: Not on file  . Years of education: Not on file  . Highest education level: Not on file  Occupational History  . Not on file  Tobacco Use  . Smoking status: Never Smoker  . Smokeless tobacco: Never Used  Vaping Use  . Vaping Use: Never used  Substance and Sexual Activity  . Alcohol use: Yes    Comment: 2 oz QOD  . Drug use: No  . Sexual activity: Not Currently  Other  Topics Concern  . Not on file  Social History Narrative  . Not on file   Social Determinants of Health   Financial Resource Strain: Low Risk   . Difficulty of Paying Living Expenses: Not hard at all  Food Insecurity: No Food Insecurity  . Worried About Charity fundraiser in the Last Year: Never true  . Ran Out of Food in the Last Year: Never true  Transportation Needs: No Transportation Needs  . Lack of Transportation (Medical): No  . Lack of Transportation (Non-Medical): No  Physical Activity: Insufficiently Active  . Days of Exercise per Week: 4 days  . Minutes of Exercise per Session: 20 min  Stress: No Stress Concern Present  . Feeling of Stress : Not at all  Social Connections: Unknown  . Frequency of Communication with Friends and Family: Not on file  . Frequency of Social Gatherings with Friends and Family: Not on  file  . Attends Religious Services: Not on file  . Active Member of Clubs or Organizations: Not on file  . Attends Archivist Meetings: Not on file  . Marital Status: Widowed    Outpatient Encounter Medications as of 06/29/2020  Medication Sig  . acetaminophen (TYLENOL) 500 MG tablet Take 1,000 mg by mouth every 8 (eight) hours as needed for mild pain or moderate pain.  Marland Kitchen amLODipine (NORVASC) 2.5 MG tablet TAKE ONE TABLET EVERY DAY  . aspirin (ASPIRIN 81) 81 MG EC tablet Take 81 mg by mouth daily. Swallow whole.  Marland Kitchen atorvastatin (LIPITOR) 10 MG tablet TAKE ONE TABLET EVERY DAY  . cholecalciferol (VITAMIN D) 1000 UNITS tablet Take 1,000 Units by mouth every other day.   . cyanocobalamin 100 MCG tablet Take 100 mcg by mouth daily.  . sertraline (ZOLOFT) 50 MG tablet Take 1 tablet (50 mg total) by mouth daily.  Marland Kitchen triamterene-hydrochlorothiazide (MAXZIDE-25) 37.5-25 MG tablet TAKE ONE TABLET EVERY DAY  . [DISCONTINUED] ALPRAZolam (XANAX) 0.25 MG tablet Take 1 tablet (0.25 mg total) by mouth at bedtime as needed.   No facility-administered encounter medications on file as of 06/29/2020.    Review of Systems  Constitutional: Negative for appetite change and unexpected weight change.  HENT: Negative for congestion and sinus pressure.   Respiratory: Negative for cough, chest tightness and shortness of breath.   Cardiovascular: Negative for chest pain, palpitations and leg swelling.  Gastrointestinal: Negative for abdominal pain, diarrhea, nausea and vomiting.  Genitourinary: Negative for difficulty urinating and dysuria.  Musculoskeletal: Negative for myalgias.       Bilateral hand pain as outlined.   Skin: Negative for color change and rash.  Neurological: Negative for dizziness, light-headedness and headaches.  Psychiatric/Behavioral: Negative for agitation and dysphoric mood.       Objective:    Physical Exam Vitals reviewed.  Constitutional:      General: She is not in  acute distress.    Appearance: Normal appearance.  HENT:     Head: Normocephalic and atraumatic.     Right Ear: External ear normal.     Left Ear: External ear normal.  Eyes:     General: No scleral icterus.       Right eye: No discharge.        Left eye: No discharge.     Conjunctiva/sclera: Conjunctivae normal.  Neck:     Thyroid: No thyromegaly.  Cardiovascular:     Rate and Rhythm: Normal rate and regular rhythm.  Pulmonary:     Effort: No respiratory distress.  Breath sounds: Normal breath sounds. No wheezing.  Abdominal:     General: Bowel sounds are normal.     Palpations: Abdomen is soft.     Tenderness: There is no abdominal tenderness.  Musculoskeletal:        General: No swelling or tenderness.     Cervical back: Neck supple. No tenderness.  Lymphadenopathy:     Cervical: No cervical adenopathy.  Skin:    Findings: No erythema or rash.  Neurological:     Mental Status: She is alert.  Psychiatric:        Mood and Affect: Mood normal.        Behavior: Behavior normal.     BP 138/78   Pulse 78   Temp 98.1 F (36.7 C) (Oral)   Resp 16   Ht '5\' 1"'  (1.549 m)   Wt 160 lb 12.8 oz (72.9 kg)   SpO2 98%   BMI 30.38 kg/m  Wt Readings from Last 3 Encounters:  06/29/20 160 lb 12.8 oz (72.9 kg)  04/03/20 162 lb (73.5 kg)  02/17/20 162 lb (73.5 kg)     Lab Results  Component Value Date   WBC 8.0 04/20/2020   HGB 12.8 04/20/2020   HCT 39.0 04/20/2020   PLT 302.0 04/20/2020   GLUCOSE 89 04/20/2020   CHOL 123 04/20/2020   TRIG 162.0 (H) 04/20/2020   HDL 39.20 04/20/2020   LDLDIRECT 66.0 09/07/2018   LDLCALC 51 04/20/2020   ALT 15 04/20/2020   AST 14 04/20/2020   NA 139 04/20/2020   K 3.6 04/20/2020   CL 103 04/20/2020   CREATININE 1.04 04/20/2020   BUN 31 (H) 04/20/2020   CO2 26 04/20/2020   TSH 2.42 04/20/2020   INR 0.9 01/16/2009   HGBA1C 6.1 04/20/2020    MM 3D SCREEN BREAST BILATERAL  Result Date: 06/26/2020 CLINICAL DATA:  Screening.  EXAM: DIGITAL SCREENING BILATERAL MAMMOGRAM WITH TOMO AND CAD COMPARISON:  Previous exam(s). ACR Breast Density Category b: There are scattered areas of fibroglandular density. FINDINGS: There are no findings suspicious for malignancy. Images were processed with CAD. IMPRESSION: No mammographic evidence of malignancy. A result letter of this screening mammogram will be mailed directly to the patient. RECOMMENDATION: Screening mammogram in one year. (Code:SM-B-01Y) BI-RADS CATEGORY  1: Negative. Electronically Signed   By: Lajean Manes M.D.   On: 06/26/2020 09:47       Assessment & Plan:   Problem List Items Addressed This Visit    Mixed hyperlipidemia    On crestor.  Low cholesterol diet and exercise.  Follow lipid panel and liver function tests.        Relevant Orders   Hepatic function panel   Lipid panel   Mild depression ([place])    Discussed with her today.  On zoloft.  Stable.  Follow.       Hyperglycemia    Low carb diet and exercise.  Follow met b and a1c.       Relevant Orders   Hemoglobin A1c   Essential hypertension    Continue triam/hctz and amlodipine.  Follow pressures.  Follow metabolic panel.       Relevant Orders   Basic metabolic panel   Decreased hearing    Has hearing aids.        CKD (chronic kidney disease) stage 3, GFR 30-59 ml/min (HCC)    Continue to avoid antiinflammatories.  Follow metabolic panel.       Carpal tunnel syndrome    Seeing  Dr Peggye Ley.  Continue f/u.       Aortic atherosclerosis (HCC)    Continue lipitor.        Anxiety    Doing well on zoloft.  Not taking xanax.  Follow.        Anemia    Follow cbc.           Einar Pheasant, MD

## 2020-06-30 ENCOUNTER — Encounter: Payer: Self-pay | Admitting: Internal Medicine

## 2020-06-30 NOTE — Assessment & Plan Note (Signed)
Continue triam/hctz and amlodipine.  Follow pressures.  Follow metabolic panel.  

## 2020-06-30 NOTE — Assessment & Plan Note (Addendum)
Discussed with her today.  On zoloft.  Stable.  Follow.

## 2020-06-30 NOTE — Assessment & Plan Note (Signed)
Low carb diet and exercise.  Follow met b and a1c.  

## 2020-06-30 NOTE — Assessment & Plan Note (Signed)
Continue lipitor  ?

## 2020-06-30 NOTE — Assessment & Plan Note (Signed)
On crestor.  Low cholesterol diet and exercise.  Follow lipid panel and liver function tests.   

## 2020-06-30 NOTE — Assessment & Plan Note (Signed)
Continue to avoid antiinflammatories.  Follow metabolic panel.  

## 2020-06-30 NOTE — Assessment & Plan Note (Signed)
Has hearing aids.

## 2020-06-30 NOTE — Assessment & Plan Note (Signed)
Doing well on zoloft.  Not taking xanax.  Follow.

## 2020-06-30 NOTE — Assessment & Plan Note (Signed)
Follow cbc.  

## 2020-06-30 NOTE — Assessment & Plan Note (Signed)
Seeing Dr Stephenie Acres.  Continue f/u.

## 2020-09-13 ENCOUNTER — Telehealth: Payer: Self-pay | Admitting: Internal Medicine

## 2020-09-13 MED ORDER — ALPRAZOLAM 0.25 MG PO TABS: 0.2500 mg | ORAL_TABLET | Freq: Every day | ORAL | 0 refills | Status: DC | PRN

## 2020-09-13 NOTE — Telephone Encounter (Signed)
Patient has to put her dog down, it is just the 2 of them. She wanted to know if Dr. Lorin Picket would give her something to common her down for the next few weeks.

## 2020-09-13 NOTE — Telephone Encounter (Signed)
Tell her to get rid of the old xanax.  I have sent in a new prescription for xanax to have if needed.  Can schedule appt if she feels needs.  Let us know if needs anything.

## 2020-09-13 NOTE — Telephone Encounter (Signed)
Patient has some xanax that expired in 2021. She uses them prn. Are you ok with sending in new script for her?

## 2020-09-13 NOTE — Telephone Encounter (Signed)
Patient is aware and she will let me know if she feels she needs to do a virtual visit

## 2020-09-18 DIAGNOSIS — D2262 Melanocytic nevi of left upper limb, including shoulder: Secondary | ICD-10-CM | POA: Diagnosis not present

## 2020-09-18 DIAGNOSIS — L821 Other seborrheic keratosis: Secondary | ICD-10-CM | POA: Diagnosis not present

## 2020-09-18 DIAGNOSIS — D2261 Melanocytic nevi of right upper limb, including shoulder: Secondary | ICD-10-CM | POA: Diagnosis not present

## 2020-09-18 DIAGNOSIS — D225 Melanocytic nevi of trunk: Secondary | ICD-10-CM | POA: Diagnosis not present

## 2020-09-18 DIAGNOSIS — D2272 Melanocytic nevi of left lower limb, including hip: Secondary | ICD-10-CM | POA: Diagnosis not present

## 2020-09-18 DIAGNOSIS — L820 Inflamed seborrheic keratosis: Secondary | ICD-10-CM | POA: Diagnosis not present

## 2020-09-18 DIAGNOSIS — D2271 Melanocytic nevi of right lower limb, including hip: Secondary | ICD-10-CM | POA: Diagnosis not present

## 2020-09-18 DIAGNOSIS — L570 Actinic keratosis: Secondary | ICD-10-CM | POA: Diagnosis not present

## 2020-09-18 DIAGNOSIS — D485 Neoplasm of uncertain behavior of skin: Secondary | ICD-10-CM | POA: Diagnosis not present

## 2020-09-18 DIAGNOSIS — L538 Other specified erythematous conditions: Secondary | ICD-10-CM | POA: Diagnosis not present

## 2020-09-27 ENCOUNTER — Other Ambulatory Visit: Payer: Medicare PPO

## 2020-10-01 ENCOUNTER — Encounter: Payer: Medicare PPO | Admitting: Internal Medicine

## 2020-10-08 ENCOUNTER — Telehealth: Payer: Self-pay | Admitting: Internal Medicine

## 2020-10-08 NOTE — Telephone Encounter (Signed)
Last OV 10/01/20 ok to fill.

## 2020-10-08 NOTE — Telephone Encounter (Signed)
She had been taking this on rare occasion previously.  This last refill, I just sent in 09/13/20.  It appears she is now taking daily.  I can refill x 1, but needs an appt to discuss other treatment options.  This is not a good option to take daily - long term.

## 2020-10-09 NOTE — Telephone Encounter (Signed)
Patient called about her refill. Patient stated she needed to speak to [facility]. Patient was advised that Dr. Lorin Picket wanted to see her about this refill. Patient stated that she did not need to be seen. Patient states she is only taking this medication because she may have to put her dog down. Patient says she only has 2 pills remaining.

## 2020-10-09 NOTE — Telephone Encounter (Signed)
LMTCB

## 2020-10-10 ENCOUNTER — Other Ambulatory Visit: Payer: Self-pay | Admitting: Internal Medicine

## 2020-10-10 NOTE — Telephone Encounter (Signed)
Patient took dog to vet and he is stable. I have explained message below and scheduled her for a telephone visit with Dr Lorin Picket to discuss other treatment options

## 2020-10-10 NOTE — Telephone Encounter (Signed)
Pt called back about xanax-pt has appt at vet at 11. Please advise

## 2020-10-11 ENCOUNTER — Telehealth (INDEPENDENT_AMBULATORY_CARE_PROVIDER_SITE_OTHER): Payer: Medicare PPO | Admitting: Internal Medicine

## 2020-10-11 ENCOUNTER — Encounter: Payer: Self-pay | Admitting: Internal Medicine

## 2020-10-11 DIAGNOSIS — I10 Essential (primary) hypertension: Secondary | ICD-10-CM | POA: Diagnosis not present

## 2020-10-11 DIAGNOSIS — F320 Major depressive disorder, single episode, mild: Secondary | ICD-10-CM

## 2020-10-11 DIAGNOSIS — F32A Depression, unspecified: Secondary | ICD-10-CM

## 2020-10-11 MED ORDER — SERTRALINE HCL 50 MG PO TABS
ORAL_TABLET | ORAL | 1 refills | Status: DC
Start: 2020-10-11 — End: 2020-11-13

## 2020-10-11 MED ORDER — ALPRAZOLAM 0.25 MG PO TABS: 0.2500 mg | ORAL_TABLET | Freq: Every day | ORAL | 0 refills | Status: DC | PRN

## 2020-10-11 NOTE — Progress Notes (Signed)
Patient ID: Alicia Mccann, female   DOB: 21-Jul-1933, 87 y.o.   MRN: [id number]   Virtual Visit via telephone Note  This visit type was conducted due to national recommendations for restrictions regarding the COVID-19 pandemic (e.g. social distancing).  This format is felt to be most appropriate for this patient at this time.  All issues noted in this document were discussed and addressed.  No physical exam was performed (except for noted visual exam findings with Video Visits).   I connected with Alicia Mccann by telephone and verified that I am speaking with the correct person using two identifiers. Location patient: home Location provider: work  Persons participating in the telephone visit: patient, provider  The limitations, risks, security and privacy concerns of performing an evaluation and management service by telephone and the availability of in person appointments have been discussed.  It has also been discussed with the patient that there may be a patient responsible charge related to this service. The patient expressed understanding and agreed to proceed.   Reason for visit: work in appt  HPI: Work in with concerns regarding increased stress, anxiety and some depression.  Increased stress with family issues.  She does not see her daughter often and does not hear from her on a regular basis.  States she will get a text form her occasionally.  Would like to be able to talk with her more.  Recent increased stress and more upset because her dog of 16 years is not doing well.  Having a hard time dealing with this.  Discussed with her today.  She had asked for a refill for xanax.  Over the last few weeks, has been using xanax on a more regular basis.  Discussed other treatment options.  She is on zoloft.  Discussed increasing zoloft.  Can still have xanax to take prn during this time of increased stress.  She does have friends.  One in particular she can talk to.  Discussed counseling. She is  agreeable.  She is eating.     ROS: See pertinent positives and negatives per HPI.  Past Medical History:  Diagnosis Date  . Allergic rhinitis   . Anemia   . Anxiety   . Cellulitis   . Cholelithiasis   . Essential hypertension   . History of stress test    a. 12/2008 Ex MV: EF 78%, no ischemia.  . Insomnia   . Osteoarthritis   . Tachycardia   . Valvular heart disease    a. 12/2008 Echo: EF 65%, mild MR;  b. 2/6 SEM RUSB - insignificant murmur, prob Ao Sclerosis.    Past Surgical History:  Procedure Laterality Date  . BACK SURGERY    . BUNIONECTOMY    . CATARACT EXTRACTION W/PHACO Right 05/18/2019   Procedure: CATARACT EXTRACTION PHACO AND INTRAOCULAR LENS PLACEMENT (IOC) RIGHT MALYUGIN  01:06.5  20.6%  13.72;  Surgeon: Lockie Mola, MD;  Location: [facility];  Service: Ophthalmology;  Laterality: Right;  please leave patient arrival 10  . CATARACT EXTRACTION W/PHACO Left 06/08/2019   Procedure: CATARACT EXTRACTION PHACO AND INTRAOCULAR LENS PLACEMENT (IOC) LEFT  00:51.0  20.3%  10.37;  Surgeon: Lockie Mola, MD;  Location: [facility];  Service: Ophthalmology;  Laterality: Left;  ARRIVAL 10:30 PLEASE LEAVE  . CHOLECYSTECTOMY  03/29/2007  . GALLBLADDER SURGERY    . HIP SURGERY     bilateral   . REPLACEMENT TOTAL KNEE Right   . THUMB ARTHROSCOPY  Family History  Problem Relation Age of Onset  . Heart attack Father   . Stroke Father   . Heart disease Father   . CAD Father   . Heart attack Son 21       MI  . Hypertension Son   . Hypertension Mother   . Heart disease Mother   . Stroke Mother   . Breast cancer Neg Hx     SOCIAL HX: reviewed.    Current Outpatient Medications:  .  acetaminophen (TYLENOL) 500 MG tablet, Take 1,000 mg by mouth every 8 (eight) hours as needed for mild pain or moderate pain., Disp: , Rfl:  .  amLODipine (NORVASC) 2.5 MG tablet, TAKE ONE TABLET EVERY DAY, Disp: 90 tablet, Rfl: 3 .  aspirin 81 MG EC  tablet, Take 81 mg by mouth daily. Swallow whole., Disp: , Rfl:  .  atorvastatin (LIPITOR) 10 MG tablet, TAKE ONE TABLET EVERY DAY, Disp: 90 tablet, Rfl: 3 .  cholecalciferol (VITAMIN D) 1000 UNITS tablet, Take 1,000 Units by mouth every other day. , Disp: , Rfl:  .  cyanocobalamin 100 MCG tablet, Take 100 mcg by mouth daily., Disp: , Rfl:  .  triamterene-hydrochlorothiazide (MAXZIDE-25) 37.5-25 MG tablet, TAKE ONE TABLET EVERY DAY, Disp: 90 tablet, Rfl: 3 .  ALPRAZolam (XANAX) 0.25 MG tablet, Take 1 tablet (0.25 mg total) by mouth daily as needed for anxiety., Disp: 30 tablet, Rfl: 0 .  sertraline (ZOLOFT) 50 MG tablet, Take 1 1/2 tablet q day, Disp: 45 tablet, Rfl: 1  EXAM:  GENERAL: alert. Sounds to be in no acute distress.  Answering questions appropriately.   PSYCH/NEURO: pleasant and cooperative, no obvious depression or anxiety, speech and thought processing grossly intact  ASSESSMENT AND PLAN:  Discussed the following assessment and plan:  Problem List Items Addressed This Visit    Essential hypertension    Continue triam/hctz and amlodipine.  Follow pressures.       Mild depression (HCC)    Increased stress and depression as outlined.  Discussed with her today.  No suicidal ideations. Discussed treatment options.  Will increase zoloft to 75mg  q day.  Refill xanax.  Instructed on taking.  Also gave her information for local counselors.  Follow closely.  Call with update.  Arrange f/u.       Relevant Medications   sertraline (ZOLOFT) 50 MG tablet   ALPRAZolam (XANAX) 0.25 MG tablet       I discussed the assessment and treatment plan with the patient. The patient was provided an opportunity to ask questions and all were answered. The patient agreed with the plan and demonstrated an understanding of the instructions.   The patient was advised to call back or seek an in-person evaluation if the symptoms worsen or if the condition fails to improve as anticipated.  I provided  30 minutes of non-face-to-face time during this encounter.   , MD

## 2020-10-11 NOTE — Telephone Encounter (Signed)
Hold on refilling.  She has an appt with me today to discuss.

## 2020-10-12 ENCOUNTER — Encounter: Payer: Self-pay | Admitting: Internal Medicine

## 2020-10-12 NOTE — Assessment & Plan Note (Signed)
Continue triam/hctz and amlodipine.  Follow pressures.

## 2020-10-12 NOTE — Assessment & Plan Note (Signed)
Increased stress and depression as outlined.  Discussed with her today.  No suicidal ideations. Discussed treatment options.  Will increase zoloft to 75mg  q day.  Refill xanax.  Instructed on taking.  Also gave her information for local counselors.  Follow closely.  Call with update.  Arrange f/u.

## 2020-10-30 DIAGNOSIS — L570 Actinic keratosis: Secondary | ICD-10-CM | POA: Diagnosis not present

## 2020-11-13 ENCOUNTER — Other Ambulatory Visit: Payer: Self-pay | Admitting: Internal Medicine

## 2020-11-13 ENCOUNTER — Other Ambulatory Visit: Payer: Self-pay | Admitting: Cardiovascular Disease

## 2020-11-13 NOTE — Telephone Encounter (Signed)
Rx request sent to pharmacy.  

## 2020-11-19 DIAGNOSIS — Z961 Presence of intraocular lens: Secondary | ICD-10-CM | POA: Diagnosis not present

## 2020-11-26 DIAGNOSIS — L304 Erythema intertrigo: Secondary | ICD-10-CM | POA: Diagnosis not present

## 2020-11-26 DIAGNOSIS — L239 Allergic contact dermatitis, unspecified cause: Secondary | ICD-10-CM | POA: Diagnosis not present

## 2020-12-03 ENCOUNTER — Other Ambulatory Visit (INDEPENDENT_AMBULATORY_CARE_PROVIDER_SITE_OTHER): Payer: Medicare PPO

## 2020-12-03 ENCOUNTER — Other Ambulatory Visit: Payer: Self-pay

## 2020-12-03 DIAGNOSIS — I10 Essential (primary) hypertension: Secondary | ICD-10-CM

## 2020-12-03 DIAGNOSIS — R739 Hyperglycemia, unspecified: Secondary | ICD-10-CM

## 2020-12-03 DIAGNOSIS — E782 Mixed hyperlipidemia: Secondary | ICD-10-CM | POA: Diagnosis not present

## 2020-12-03 LAB — BASIC METABOLIC PANEL
BUN: 29 mg/dL — ABNORMAL HIGH (ref 6–23)
CO2: 29 mEq/L (ref 19–32)
Calcium: 10 mg/dL (ref 8.4–10.5)
Chloride: 104 mEq/L (ref 96–112)
Creatinine, Ser: 1.11 mg/dL (ref 0.40–1.20)
GFR: 45.32 mL/min — ABNORMAL LOW (ref >60.00)
Glucose, Bld: 88 mg/dL (ref 70–99)
Potassium: 3.9 mEq/L (ref 3.5–5.1)
Sodium: 141 mEq/L (ref 135–145)

## 2020-12-03 LAB — LIPID PANEL
Cholesterol: 133 mg/dL (ref 0–200)
HDL: 49 mg/dL (ref >39.00)
LDL Cholesterol: 58 mg/dL (ref 0–99)
NonHDL: 83.81
Total CHOL/HDL Ratio: 3
Triglycerides: 131 mg/dL (ref 0.0–149.0)
VLDL: 26.2 mg/dL (ref 0.0–40.0)

## 2020-12-03 LAB — HEPATIC FUNCTION PANEL
ALT: 15 U/L (ref 0–35)
AST: 15 U/L (ref 0–37)
Albumin: 4.2 g/dL (ref 3.5–5.2)
Alkaline Phosphatase: 72 U/L (ref 39–117)
Bilirubin, Direct: 0.1 mg/dL (ref 0.0–0.3)
Total Bilirubin: 0.6 mg/dL (ref 0.2–1.2)
Total Protein: 6.5 g/dL (ref 6.0–8.3)

## 2020-12-03 LAB — HEMOGLOBIN A1C: Hgb A1c MFr Bld: 6 % (ref 4.6–6.5)

## 2020-12-04 ENCOUNTER — Telehealth (INDEPENDENT_AMBULATORY_CARE_PROVIDER_SITE_OTHER): Payer: Medicare PPO | Admitting: Internal Medicine

## 2020-12-04 ENCOUNTER — Encounter: Payer: Self-pay | Admitting: Internal Medicine

## 2020-12-04 DIAGNOSIS — F320 Major depressive disorder, single episode, mild: Secondary | ICD-10-CM | POA: Diagnosis not present

## 2020-12-04 DIAGNOSIS — F419 Anxiety disorder, unspecified: Secondary | ICD-10-CM

## 2020-12-04 DIAGNOSIS — I10 Essential (primary) hypertension: Secondary | ICD-10-CM

## 2020-12-04 DIAGNOSIS — R739 Hyperglycemia, unspecified: Secondary | ICD-10-CM

## 2020-12-04 DIAGNOSIS — N183 Chronic kidney disease, stage 3 unspecified: Secondary | ICD-10-CM

## 2020-12-04 DIAGNOSIS — D649 Anemia, unspecified: Secondary | ICD-10-CM

## 2020-12-04 DIAGNOSIS — H9190 Unspecified hearing loss, unspecified ear: Secondary | ICD-10-CM | POA: Diagnosis not present

## 2020-12-04 DIAGNOSIS — I700 Atherosclerosis of aorta: Secondary | ICD-10-CM

## 2020-12-04 DIAGNOSIS — F32A Depression, unspecified: Secondary | ICD-10-CM

## 2020-12-04 MED ORDER — SERTRALINE HCL 100 MG PO TABS: 100.0000 mg | ORAL_TABLET | Freq: Every day | ORAL | 2 refills | Status: DC

## 2020-12-04 MED ORDER — ALPRAZOLAM 0.25 MG PO TABS: 0.2500 mg | ORAL_TABLET | Freq: Every day | ORAL | 0 refills | Status: DC | PRN

## 2020-12-04 NOTE — Progress Notes (Signed)
Virtual Visit via telephone Note  This visit type was conducted due to national recommendations for restrictions regarding the COVID-19 pandemic (e.g. social distancing).  This format is felt to be most appropriate for this patient at this time.  All issues noted in this document were discussed and addressed.  No physical exam was performed (except for noted visual exam findings with Video Visits).   I connected with Alicia Mccann by telephone and verified that I am speaking with the correct person using two identifiers. Location patient: home Location provider: home office Persons participating in the telephone visit: patient, provider  The limitations, risks, security and privacy concerns of performing an evaluation and management service by telephone and the availability of in person appointments have been discussed.  It has also been discussed with the patient that there may be a patient responsible charge related to this service. The patient expressed understanding and agreed to proceed.   Reason for visit: scheduled follow up.   HPI: Here to follow up regarding increased stress.  States she feels overwhelmed.  Lives by herself.  Is responsible for everything around her house.  Her dog has been sick.  They are still walking.  Her daughter-n-law is supportive.  Taking zoloft - 47m q day.  Tolerating.  Discussed increasing the dose - given persistent increased stress.  Her daughter did do her taxes.  No chest pain reported.  No increased cough or congestion.  No abdominal pain.  Eating.  Bowels moving.  Discussed seeing a therapist.  She is agreeable.  Also having hearing issues - hearing aid.  Request referral back.    ROS: See pertinent positives and negatives per HPI.  Past Medical History:  Diagnosis Date  . Allergic rhinitis   . Anemia   . Anxiety   . Cellulitis   . Cholelithiasis   . Essential hypertension   . History of stress test    a. 12/2008 Ex MV: EF 78%, no ischemia.  .  Insomnia   . Osteoarthritis   . Tachycardia   . Valvular heart disease    a. 12/2008 Echo: EF 65%, mild MR;  b. 2/6 SEM RUSB - insignificant murmur, prob Ao Sclerosis.    Past Surgical History:  Procedure Laterality Date  . BACK SURGERY    . BUNIONECTOMY    . CATARACT EXTRACTION W/PHACO Right 05/18/2019   Procedure: CATARACT EXTRACTION PHACO AND INTRAOCULAR LENS PLACEMENT (IOC) RIGHT MALYUGIN  01:06.5  20.6%  13.72;  Surgeon: BLeandrew Koyanagi MD;  [place] [facility]  Service: Ophthalmology;  Laterality: Right;  please leave patient arrival 10  . CATARACT EXTRACTION W/PHACO Left 06/08/2019   Procedure: CATARACT EXTRACTION PHACO AND INTRAOCULAR LENS PLACEMENT (IOC) LEFT  00:51.0  20.3%  10.37;  Surgeon: BLeandrew Koyanagi MD;  [place] [facility]  Service: Ophthalmology;  Laterality: Left;  ARRIVAL 10:30 PLEASE LEAVE  . CHOLECYSTECTOMY  03/29/2007  . GALLBLADDER SURGERY    . HIP SURGERY     bilateral   . REPLACEMENT TOTAL KNEE Right   . THUMB ARTHROSCOPY      Family History  Problem Relation Age of Onset  . Heart attack Father   . Stroke Father   . Heart disease Father   . CAD Father   . Heart attack Son 524      MI  . Hypertension Son   . Hypertension Mother   . Heart disease Mother   . Stroke Mother   . Breast cancer Neg Hx  SOCIAL HX: reviewed.    Current Outpatient Medications:  .  acetaminophen (TYLENOL) 500 MG tablet, Take 1,000 mg by mouth every 8 (eight) hours as needed for mild pain or moderate pain., Disp: , Rfl:  .  amLODipine (NORVASC) 2.5 MG tablet, TAKE ONE TABLET EVERY DAY, Disp: 90 tablet, Rfl: 3 .  aspirin 81 MG EC tablet, Take 81 mg by mouth daily. Swallow whole., Disp: , Rfl:  .  atorvastatin (LIPITOR) 10 MG tablet, Take 1 tablet (10 mg total) by mouth daily. Please schedule appointment with Dr. Rockey Situ for refills., Disp: 90 tablet, Rfl: 0 .  cholecalciferol (VITAMIN D) 1000 UNITS tablet, Take 1,000 Units by mouth every  other day. , Disp: , Rfl:  .  cyanocobalamin 100 MCG tablet, Take 100 mcg by mouth daily., Disp: , Rfl:  .  sertraline (ZOLOFT) 100 MG tablet, Take 1 tablet (100 mg total) by mouth daily., Disp: 30 tablet, Rfl: 2 .  triamterene-hydrochlorothiazide (MAXZIDE-25) 37.5-25 MG tablet, TAKE ONE TABLET EVERY DAY, Disp: 90 tablet, Rfl: 3 .  ALPRAZolam (XANAX) 0.25 MG tablet, Take 1 tablet (0.25 mg total) by mouth daily as needed for anxiety., Disp: 30 tablet, Rfl: 0  EXAM:  GENERAL: alert. Sounds to be in no acute distress.  Answering questions appropriately.   PSYCH/NEURO: pleasant and cooperative, no obvious depression or anxiety, speech and thought processing grossly intact  ASSESSMENT AND PLAN:  Discussed the following assessment and plan:  Problem List Items Addressed This Visit    Anemia    Follow cbc.       Anxiety    On zoloft.  Increase to 167m q day.  Refer to therapist as outlined.  Follow.       Relevant Medications   sertraline (ZOLOFT) 100 MG tablet   ALPRAZolam (XANAX) 0.25 MG tablet   Aortic atherosclerosis (HCC)    Continue lipitor.        CKD (chronic kidney disease) stage 3, GFR 30-59 ml/min (HCC)    Continue to avoid antiinflammatories.  Stay hydrated.  Follow metabolic panel.       Decreased hearing    Has hearing aids.  Request referral to ENT.        Relevant Orders   Ambulatory referral to ENT   Essential hypertension    Continue triam/hctz and amlodipine.  Follow pressures.  Follow metabolic panel.       Hyperglycemia    Low carb diet and exercise.  Follow met b and a1c.        Mild depression (HCC)    Increased stress and depression as outlined.  Discussed with her today.  Taking zoloft 753mq day.  Increase to 10069m day.  Discussed seeing a therapist.  She is agreeable.  List of therapist given.  Follow closely.        Relevant Medications   sertraline (ZOLOFT) 100 MG tablet   ALPRAZolam (XANAX) 0.25 MG tablet       I discussed the  assessment and treatment plan with the patient. The patient was provided an opportunity to ask questions and all were answered. The patient agreed with the plan and demonstrated an understanding of the instructions.   The patient was advised to call back or seek an in-person evaluation if the symptoms worsen or if the condition fails to improve as anticipated.  I provided 25 minutes of non-face-to-face time during this encounter.   ChaEinar PheasantD

## 2020-12-09 ENCOUNTER — Encounter: Payer: Self-pay | Admitting: Internal Medicine

## 2020-12-09 ENCOUNTER — Telehealth: Payer: Self-pay | Admitting: Internal Medicine

## 2020-12-09 NOTE — Assessment & Plan Note (Signed)
Increased stress and depression as outlined.  Discussed with her today.  Taking zoloft 75mg  q day.  Increase to 100mg  q day.  Discussed seeing a therapist.  She is agreeable.  List of therapist given.  Follow closely.

## 2020-12-09 NOTE — Assessment & Plan Note (Signed)
Continue lipitor  ?

## 2020-12-09 NOTE — Assessment & Plan Note (Signed)
Continue triam/hctz and amlodipine.  Follow pressures.  Follow metabolic panel.  

## 2020-12-09 NOTE — Assessment & Plan Note (Signed)
Continue to avoid antiinflammatories.  Stay hydrated.  Follow metabolic panel.  

## 2020-12-09 NOTE — Assessment & Plan Note (Signed)
Has hearing aids.  Request referral to ENT.

## 2020-12-09 NOTE — Assessment & Plan Note (Signed)
On zoloft.  Increase to 100mg  q day.  Refer to therapist as outlined.  Follow.

## 2020-12-09 NOTE — Telephone Encounter (Signed)
Please send copy of therapist to Ms Klare if not already sent.  Thanks.

## 2020-12-09 NOTE — Assessment & Plan Note (Signed)
Follow cbc.  

## 2020-12-09 NOTE — Assessment & Plan Note (Signed)
Low carb diet and exercise.  Follow met b and a1c.   

## 2020-12-10 NOTE — Telephone Encounter (Signed)
Mailed to patient

## 2020-12-11 ENCOUNTER — Other Ambulatory Visit: Payer: Self-pay | Admitting: Cardiovascular Disease

## 2020-12-12 NOTE — Telephone Encounter (Signed)
Scheduled

## 2020-12-12 NOTE — Telephone Encounter (Signed)
Please schedule 12 month F/U appointment with Dr. Gollan. Thank you! 

## 2020-12-17 DIAGNOSIS — H524 Presbyopia: Secondary | ICD-10-CM | POA: Diagnosis not present

## 2020-12-17 DIAGNOSIS — H53002 Unspecified amblyopia, left eye: Secondary | ICD-10-CM | POA: Diagnosis not present

## 2020-12-17 DIAGNOSIS — H40012 Open angle with borderline findings, low risk, left eye: Secondary | ICD-10-CM | POA: Diagnosis not present

## 2020-12-17 DIAGNOSIS — H5203 Hypermetropia, bilateral: Secondary | ICD-10-CM | POA: Diagnosis not present

## 2020-12-17 DIAGNOSIS — H43813 Vitreous degeneration, bilateral: Secondary | ICD-10-CM | POA: Diagnosis not present

## 2020-12-17 DIAGNOSIS — Z961 Presence of intraocular lens: Secondary | ICD-10-CM | POA: Diagnosis not present

## 2020-12-17 DIAGNOSIS — H52223 Regular astigmatism, bilateral: Secondary | ICD-10-CM | POA: Diagnosis not present

## 2021-01-11 ENCOUNTER — Other Ambulatory Visit: Payer: Self-pay | Admitting: Internal Medicine

## 2021-02-05 ENCOUNTER — Ambulatory Visit: Payer: Medicare PPO | Admitting: Cardiovascular Disease

## 2021-02-06 ENCOUNTER — Ambulatory Visit: Payer: Medicare PPO | Admitting: Internal Medicine

## 2021-02-22 ENCOUNTER — Ambulatory Visit: Payer: Medicare PPO | Admitting: Cardiovascular Disease

## 2021-03-08 ENCOUNTER — Ambulatory Visit: Payer: Medicare PPO | Admitting: Internal Medicine

## 2021-03-08 ENCOUNTER — Telehealth: Payer: Self-pay

## 2021-03-08 NOTE — Telephone Encounter (Signed)
Pt needs to speak with you about her appt on 03/15/21. She is unable to keep that appt but did not want it canceled until you call and speak with her.

## 2021-03-11 NOTE — Telephone Encounter (Signed)
Patient stated that she has to take her dog to the vet on 7/29 and will not be able to make this appt. I have rescheduled her to a Tuesday per her request.

## 2021-03-12 ENCOUNTER — Other Ambulatory Visit: Payer: Self-pay | Admitting: Internal Medicine

## 2021-03-12 ENCOUNTER — Other Ambulatory Visit: Payer: Self-pay | Admitting: Cardiovascular Disease

## 2021-03-12 DIAGNOSIS — I10 Essential (primary) hypertension: Secondary | ICD-10-CM

## 2021-03-15 ENCOUNTER — Ambulatory Visit: Payer: Medicare PPO | Admitting: Internal Medicine

## 2021-04-02 ENCOUNTER — Other Ambulatory Visit: Payer: Self-pay

## 2021-04-02 ENCOUNTER — Encounter: Payer: Self-pay | Admitting: Internal Medicine

## 2021-04-02 ENCOUNTER — Ambulatory Visit: Payer: Medicare PPO | Admitting: Internal Medicine

## 2021-04-02 VITALS — BP 158/74 | HR 71 | Temp 96.2°F | Ht 61.0 in | Wt 147.8 lb

## 2021-04-02 DIAGNOSIS — F338 Other recurrent depressive disorders: Secondary | ICD-10-CM

## 2021-04-02 DIAGNOSIS — R739 Hyperglycemia, unspecified: Secondary | ICD-10-CM | POA: Diagnosis not present

## 2021-04-02 DIAGNOSIS — F419 Anxiety disorder, unspecified: Secondary | ICD-10-CM

## 2021-04-02 DIAGNOSIS — I10 Essential (primary) hypertension: Secondary | ICD-10-CM | POA: Diagnosis not present

## 2021-04-02 DIAGNOSIS — F32A Depression, unspecified: Secondary | ICD-10-CM

## 2021-04-02 DIAGNOSIS — D649 Anemia, unspecified: Secondary | ICD-10-CM | POA: Diagnosis not present

## 2021-04-02 DIAGNOSIS — I38 Endocarditis, valve unspecified: Secondary | ICD-10-CM

## 2021-04-02 DIAGNOSIS — M2550 Pain in unspecified joint: Secondary | ICD-10-CM

## 2021-04-02 DIAGNOSIS — N183 Chronic kidney disease, stage 3 unspecified: Secondary | ICD-10-CM | POA: Diagnosis not present

## 2021-04-02 DIAGNOSIS — I739 Peripheral vascular disease, unspecified: Secondary | ICD-10-CM | POA: Diagnosis not present

## 2021-04-02 DIAGNOSIS — I700 Atherosclerosis of aorta: Secondary | ICD-10-CM

## 2021-04-02 DIAGNOSIS — F320 Major depressive disorder, single episode, mild: Secondary | ICD-10-CM | POA: Diagnosis not present

## 2021-04-02 DIAGNOSIS — R911 Solitary pulmonary nodule: Secondary | ICD-10-CM

## 2021-04-02 LAB — BASIC METABOLIC PANEL
BUN: 28 mg/dL — ABNORMAL HIGH (ref 6–23)
CO2: 28 mEq/L (ref 19–32)
Calcium: 10 mg/dL (ref 8.4–10.5)
Chloride: 101 mEq/L (ref 96–112)
Creatinine, Ser: 1.04 mg/dL (ref 0.40–1.20)
GFR: 48.89 mL/min — ABNORMAL LOW (ref >60.00)
Glucose, Bld: 86 mg/dL (ref 70–99)
Potassium: 3.6 mEq/L (ref 3.5–5.1)
Sodium: 139 mEq/L (ref 135–145)

## 2021-04-02 LAB — CBC WITH DIFFERENTIAL/PLATELET
Basophils Absolute: 0.1 10*3/uL (ref 0.0–0.1)
Basophils Relative: 1.4 % (ref 0.0–3.0)
Eosinophils Absolute: 0.4 10*3/uL (ref 0.0–0.7)
Eosinophils Relative: 3.9 % (ref 0.0–5.0)
HCT: 37.9 % (ref 36.0–46.0)
Hemoglobin: 12.4 g/dL (ref 12.0–15.0)
Lymphocytes Relative: 20.9 % (ref 12.0–46.0)
Lymphs Abs: 2.1 10*3/uL (ref 0.7–4.0)
MCHC: 32.7 g/dL (ref 30.0–36.0)
MCV: 89.5 fl (ref 78.0–100.0)
Monocytes Absolute: 0.7 10*3/uL (ref 0.1–1.0)
Monocytes Relative: 7.2 % (ref 3.0–12.0)
Neutro Abs: 6.6 10*3/uL (ref 1.4–7.7)
Neutrophils Relative %: 66.6 % (ref 43.0–77.0)
Platelets: 271 10*3/uL (ref 150.0–400.0)
RBC: 4.24 Mil/uL (ref 3.87–5.11)
RDW: 12.8 % (ref 11.5–15.5)
WBC: 9.8 10*3/uL (ref 4.0–10.5)

## 2021-04-02 LAB — HEPATIC FUNCTION PANEL
ALT: 12 U/L (ref 0–35)
AST: 15 U/L (ref 0–37)
Albumin: 4.3 g/dL (ref 3.5–5.2)
Alkaline Phosphatase: 72 U/L (ref 39–117)
Bilirubin, Direct: 0.1 mg/dL (ref 0.0–0.3)
Total Bilirubin: 0.6 mg/dL (ref 0.2–1.2)
Total Protein: 6.5 g/dL (ref 6.0–8.3)

## 2021-04-02 LAB — LIPID PANEL
Cholesterol: 138 mg/dL (ref 0–200)
HDL: 48.3 mg/dL (ref >39.00)
LDL Cholesterol: 64 mg/dL (ref 0–99)
NonHDL: 89.71
Total CHOL/HDL Ratio: 3
Triglycerides: 131 mg/dL (ref 0.0–149.0)
VLDL: 26.2 mg/dL (ref 0.0–40.0)

## 2021-04-02 LAB — HEMOGLOBIN A1C: Hgb A1c MFr Bld: 6 % (ref 4.6–6.5)

## 2021-04-02 LAB — TSH: TSH: 1.57 u[IU]/mL (ref 0.35–5.50)

## 2021-04-02 NOTE — Progress Notes (Signed)
Patient ID: Alicia Mccann, female   DOB: 10-05-1932, 87 y.o.   MRN: [id number]   Subjective:    Patient ID: Alicia Mccann, female    DOB: [birth year], 87 y.o.   MRN: [id number]  HPI This visit occurred during the SARS-CoV-2 public health emergency.  Safety protocols were in place, including screening questions prior to the visit, additional usage of staff PPE, and extensive cleaning of exam room while observing appropriate contact time as indicated for disinfecting solutions.   Patient here for a scheduled follow up.  Follow up regarding stress.  Discussed.  Increased stress.  Lives by herself.  Responsible for doing her daily activities.  She does now have somebody coming out and helping her for 4 hours per day.  No chest pain.  No sob with increased exertion.  No acid reflux.  No abdominal pain.  Bowels moving.  Does report some left shoulder discomfort and hand discomfort.  Fluctuates some left shoulder and hand discomfort -flares.  Will notify me if desires further intervention.    Past Medical History:  Diagnosis Date   Allergic rhinitis    Anemia    Anxiety    Cellulitis    Cholelithiasis    Essential hypertension    History of stress test    a. 12/2008 Ex MV: EF 78%, no ischemia.   Insomnia    Osteoarthritis    Tachycardia    Valvular heart disease    a. 12/2008 Echo: EF 65%, mild MR;  b. 2/6 SEM RUSB - insignificant murmur, prob Ao Sclerosis.   Past Surgical History:  Procedure Laterality Date   BACK SURGERY     BUNIONECTOMY     CATARACT EXTRACTION W/PHACO Right 05/18/2019   Procedure: CATARACT EXTRACTION PHACO AND INTRAOCULAR LENS PLACEMENT (IOC) RIGHT MALYUGIN  01:06.5  20.6%  13.72;  Surgeon: Leandrew Koyanagi, MD;  Location: [place];  Service: Ophthalmology;  Laterality: Right;  please leave patient arrival 10   CATARACT EXTRACTION W/PHACO Left 06/08/2019   Procedure: CATARACT EXTRACTION PHACO AND INTRAOCULAR LENS PLACEMENT (IOC) LEFT  00:51.0  20.3%   10.37;  Surgeon: Leandrew Koyanagi, MD;  Location: [place];  Service: Ophthalmology;  Laterality: Left;  ARRIVAL 10:30 PLEASE LEAVE   CHOLECYSTECTOMY  03/29/2007   GALLBLADDER SURGERY     HIP SURGERY     bilateral    REPLACEMENT TOTAL KNEE Right    THUMB ARTHROSCOPY     Family History  Problem Relation Age of Onset   Heart attack Father    Stroke Father    Heart disease Father    CAD Father    Heart attack Son 55       MI   Hypertension Son    Hypertension Mother    Heart disease Mother    Stroke Mother    Breast cancer Neg Hx    Social History   Socioeconomic History   Marital status: Widowed    Spouse name: Not on file   Number of children: Not on file   Years of education: Not on file   Highest education level: Not on file  Occupational History   Not on file  Tobacco Use   Smoking status: Never   Smokeless tobacco: Never  Vaping Use   Vaping Use: Never used  Substance and Sexual Activity   Alcohol use: Yes    Comment: 2 oz QOD   Drug use: No   Sexual activity: Not Currently  Other Topics Concern   Not  on file  Social History Narrative   Not on file   Social Determinants of Health   Financial Resource Strain: Not on file  Food Insecurity: Not on file  Transportation Needs: Not on file  Physical Activity: Not on file  Stress: Not on file  Social Connections: Not on file    Review of Systems  Constitutional:  Negative for appetite change and unexpected weight change.  HENT:  Negative for congestion and sinus pressure.   Respiratory:  Negative for cough, chest tightness and shortness of breath.   Cardiovascular:  Negative for chest pain, palpitations and leg swelling.  Gastrointestinal:  Negative for abdominal pain, diarrhea, nausea and vomiting.  Genitourinary:  Negative for difficulty urinating and dysuria.  Musculoskeletal:  Negative for joint swelling and myalgias.       Intermittent flares - left shoulder and hands.    Skin:   Negative for rash.  Neurological:  Negative for dizziness, light-headedness and headaches.  Psychiatric/Behavioral:  Negative for agitation and dysphoric mood.       Objective:    Physical Exam Vitals reviewed.  Constitutional:      General: She is not in acute distress.    Appearance: Normal appearance.  HENT:     Head: Normocephalic and atraumatic.     Right Ear: External ear normal.     Left Ear: External ear normal.  Eyes:     General: No scleral icterus.       Right eye: No discharge.        Left eye: No discharge.     Conjunctiva/sclera: Conjunctivae normal.  Neck:     Thyroid: No thyromegaly.  Cardiovascular:     Rate and Rhythm: Normal rate and regular rhythm.  Pulmonary:     Effort: No respiratory distress.     Breath sounds: Normal breath sounds. No wheezing.  Abdominal:     General: Bowel sounds are normal.     Palpations: Abdomen is soft.     Tenderness: There is no abdominal tenderness.  Musculoskeletal:        General: No swelling or tenderness.     Cervical back: Neck supple. No tenderness.  Lymphadenopathy:     Cervical: No cervical adenopathy.  Skin:    Findings: No erythema or rash.  Neurological:     Mental Status: She is alert.  Psychiatric:        Mood and Affect: Mood normal.        Behavior: Behavior normal.    BP (!) 158/74 (BP Location: Left Arm, Patient Position: Sitting, Cuff Size: Normal)   Pulse 71   Temp (!) 96.2 F (35.7 C) (Temporal)   Ht 5' 1" (1.549 m)   Wt 147 lb 12.8 oz (67 kg)   SpO2 97%   BMI 27.93 kg/m  Wt Readings from Last 3 Encounters:  04/02/21 147 lb 12.8 oz (67 kg)  12/04/20 151 lb 6.4 oz (68.7 kg)  10/11/20 160 lb (72.6 kg)    Outpatient Encounter Medications as of 04/02/2021  Medication Sig   acetaminophen (TYLENOL) 500 MG tablet Take 1,000 mg by mouth every 8 (eight) hours as needed for mild pain or moderate pain.   ALPRAZolam (XANAX) 0.25 MG tablet Take 1 tablet (0.25 mg total) by mouth daily as needed for  anxiety.   amLODipine (NORVASC) 2.5 MG tablet TAKE ONE TABLET EVERY DAY   aspirin 81 MG EC tablet Take 81 mg by mouth daily. Swallow whole.   atorvastatin (LIPITOR) 10 MG tablet  TAKE 1 TABLET BY MOUTH DAILY   cholecalciferol (VITAMIN D) 1000 UNITS tablet Take 1,000 Units by mouth every other day.    cyanocobalamin 100 MCG tablet Take 100 mcg by mouth daily.   sertraline (ZOLOFT) 100 MG tablet TAKE 1 TABLET BY MOUTH DAILY   triamterene-hydrochlorothiazide (MAXZIDE-25) 37.5-25 MG tablet TAKE ONE TABLET EVERY DAY   No facility-administered encounter medications on file as of 04/02/2021.     Lab Results  Component Value Date   WBC 9.8 04/02/2021   HGB 12.4 04/02/2021   HCT 37.9 04/02/2021   PLT 271.0 04/02/2021   GLUCOSE 86 04/02/2021   CHOL 138 04/02/2021   TRIG 131.0 04/02/2021   HDL 48.30 04/02/2021   LDLDIRECT 66.0 09/07/2018   LDLCALC 64 04/02/2021   ALT 12 04/02/2021   AST 15 04/02/2021   NA 139 04/02/2021   K 3.6 04/02/2021   CL 101 04/02/2021   CREATININE 1.04 04/02/2021   BUN 28 (H) 04/02/2021   CO2 28 04/02/2021   TSH 1.57 04/02/2021   INR 0.9 01/16/2009   HGBA1C 6.0 04/02/2021    MM 3D SCREEN BREAST BILATERAL  Result Date: 06/26/2020 CLINICAL DATA:  Screening. EXAM: DIGITAL SCREENING BILATERAL MAMMOGRAM WITH TOMO AND CAD COMPARISON:  Previous exam(s). ACR Breast Density Category b: There are scattered areas of fibroglandular density. FINDINGS: There are no findings suspicious for malignancy. Images were processed with CAD. IMPRESSION: No mammographic evidence of malignancy. A result letter of this screening mammogram will be mailed directly to the patient. RECOMMENDATION: Screening mammogram in one year. (Code:SM-B-01Y) BI-RADS CATEGORY  1: Negative. Electronically Signed   By: Lajean Manes M.D.   On: 06/26/2020 09:47       Assessment & Plan:   Problem List Items Addressed This Visit     Anemia    Follow cbc.       Relevant Orders   CBC with  Differential/Platelet (Completed)   Anxiety    Continue zoloft 134m q day.  Follow.       Aortic atherosclerosis (HCC)    Continue statin medication.  Follow.        CKD (chronic kidney disease) stage 3, GFR 30-59 ml/min (HCC)    Continue to avoid antiinflammatories.  Stay hydrated.  Follow metabolic panel.       Essential hypertension - Primary    Continue triam/hctz and amlodipine.  Follow pressures.  Follow metabolic panel.       Relevant Orders   Lipid panel (Completed)   TSH (Completed)   Hepatic function panel (Completed)   Basic metabolic panel (Completed)   Hyperglycemia    Low carb diet and exercise.  Follow met b and a1c.        Relevant Orders   Lipid panel (Completed)   Hemoglobin A1c (Completed)   Joint pain    Flares - intermittently worse.  Stretches. Follow.       Lung nodule    Found incidentally on previus CT.  F/u CT chest - confirm stable.       Mild depression (HCC)    Now on zoloft 1074mq day.  Doing well on this dose. No changes.  Follow.       [place] disease) [place]   Continue crestor and aspirin.       Seasonal affective disorder ([facility]   Increased stress.  Does not feel needs any further intervention currently.  Follow.       Valvular heart disease  Had echo - aortic valve sclerosis.  Follow.         Einar Pheasant, MD

## 2021-04-04 ENCOUNTER — Ambulatory Visit: Payer: Medicare PPO

## 2021-04-04 ENCOUNTER — Telehealth: Payer: Self-pay

## 2021-04-04 NOTE — Telephone Encounter (Signed)
Unable to reach patient for scheduled AWV. No answer. No voicemail. Reschedule patient as appropriate.

## 2021-04-07 ENCOUNTER — Encounter: Payer: Self-pay | Admitting: Internal Medicine

## 2021-04-07 DIAGNOSIS — M2550 Pain in unspecified joint: Secondary | ICD-10-CM | POA: Insufficient documentation

## 2021-04-07 NOTE — Assessment & Plan Note (Signed)
Continue crestor and aspirin.  

## 2021-04-07 NOTE — Assessment & Plan Note (Signed)
Continue to avoid antiinflammatories.  Stay hydrated.  Follow metabolic panel.  

## 2021-04-07 NOTE — Assessment & Plan Note (Signed)
Low carb diet and exercise.  Follow met b and a1c.   

## 2021-04-07 NOTE — Assessment & Plan Note (Signed)
Now on zoloft 100mg  q day.  Doing well on this dose. No changes.  Follow.

## 2021-04-07 NOTE — Assessment & Plan Note (Signed)
Follow cbc.  

## 2021-04-07 NOTE — Assessment & Plan Note (Signed)
Continue triam/hctz and amlodipine.  Follow pressures.  Follow metabolic panel.  

## 2021-04-07 NOTE — Assessment & Plan Note (Signed)
Continue zoloft 100mg  q day.  Follow.

## 2021-04-07 NOTE — Assessment & Plan Note (Signed)
Found incidentally on previus CT.  F/u CT chest - confirm stable.

## 2021-04-07 NOTE — Assessment & Plan Note (Signed)
Continue statin medication.  Follow.

## 2021-04-07 NOTE — Assessment & Plan Note (Signed)
Increased stress.  Does not feel needs any further intervention currently.  Follow.

## 2021-04-07 NOTE — Assessment & Plan Note (Signed)
Had echo - aortic valve sclerosis.  Follow.  

## 2021-04-07 NOTE — Assessment & Plan Note (Signed)
Flares - intermittently worse.  Stretches. Follow.

## 2021-04-09 ENCOUNTER — Other Ambulatory Visit: Payer: Self-pay

## 2021-04-09 ENCOUNTER — Encounter: Payer: Self-pay | Admitting: Cardiovascular Disease

## 2021-04-09 ENCOUNTER — Ambulatory Visit: Payer: Medicare PPO | Admitting: Cardiovascular Disease

## 2021-04-09 VITALS — BP 136/60 | HR 77 | Ht 61.0 in | Wt 148.0 lb

## 2021-04-09 DIAGNOSIS — I10 Essential (primary) hypertension: Secondary | ICD-10-CM

## 2021-04-09 DIAGNOSIS — I38 Endocarditis, valve unspecified: Secondary | ICD-10-CM

## 2021-04-09 DIAGNOSIS — I739 Peripheral vascular disease, unspecified: Secondary | ICD-10-CM | POA: Diagnosis not present

## 2021-04-09 DIAGNOSIS — I2510 Atherosclerotic heart disease of native coronary artery without angina pectoris: Secondary | ICD-10-CM

## 2021-04-09 DIAGNOSIS — I700 Atherosclerosis of aorta: Secondary | ICD-10-CM

## 2021-04-09 DIAGNOSIS — E782 Mixed hyperlipidemia: Secondary | ICD-10-CM

## 2021-04-09 MED ORDER — AMLODIPINE BESYLATE 2.5 MG PO TABS: 2.5000 mg | ORAL_TABLET | Freq: Every day | ORAL | 3 refills | Status: DC

## 2021-04-09 MED ORDER — ATORVASTATIN CALCIUM 10 MG PO TABS: 10.0000 mg | ORAL_TABLET | Freq: Every day | ORAL | 3 refills | Status: DC

## 2021-04-09 MED ORDER — TRIAMTERENE-HCTZ 37.5-25 MG PO TABS
1.0000 | ORAL_TABLET | Freq: Every day | ORAL | 3 refills | Status: DC
Start: 2021-04-09 — End: 2022-04-14

## 2021-04-09 NOTE — Patient Instructions (Signed)
Medication Instructions:  No changes  If you need a refill on your cardiac medications before your next appointment, please call your pharmacy.    Lab work: No new labs needed   If you have labs (blood work) drawn today and your tests are completely normal, you will receive your results only by: MyChart Message (if you have MyChart) OR A paper copy in the mail If you have any lab test that is abnormal or we need to change your treatment, we will call you to review the results.   Testing/Procedures: No new testing needed   Follow-Up: At [facility], you and your health needs are our priority.  As part of our continuing mission to provide you with exceptional heart care, we have created designated Provider Care Teams.  These Care Teams include your primary Cardiologist (physician) and Advanced Practice Providers (APPs -  Physician Assistants and Nurse Practitioners) who all work together to provide you with the care you need, when you need it.  You will need a follow up appointment in 12 months  Providers on your designated Care Team:   Christopher Berge, NP Ryan Dunn, PA-C Jacquelyn Visser, PA-C Cadence Furth, PA-C  Any Other Special Instructions Will Be Listed Below (If Applicable).  COVID-19 Vaccine Information can be found at: [email or web address] For questions related to vaccine distribution or appointments, please email [email or web address] or call [phone]    

## 2021-04-09 NOTE — Progress Notes (Signed)
Cardiology Office Note  Date:  04/09/2021   ID:  Alicia Mccann, DOB 07-01-1933, MRN [id number]  PCP:  Dale , MD   Chief Complaint  Patient presents with   12 month follow up     "Doing well." Medications reviewed by the patient verbally.     HPI:  Alicia Mccann is a 87 year-old woman history of  hypertension,  obesity,  osteoarthritis,  previously evaluated for murmur, aortic valve sclerosis without significant stenosis  Hypertension 12/2008 Ex MV: EF 78%, no ischemia. CT chest May 2019 coronary calcium , moderate in nature LAD Aortic plaque, mild to moderate/arch She presents for routine follow-up of her hypertension and murmur  Feels well,  Still driving, has a friend who helps her Active  Trouble with severe arthritis of her fingers,   Has  a dog, several walks a day [place], no recent falls No regular exercise program  Denies any tachycardia concerning for arrhythmia No chest pain concerning for angina  Reports blood pressure relatively well controlled at home Gets anxious  EKG personally reviewed by myself on todays visit Shows normal sinus rhythm rate 77  bpm no significant ST or T wave changes  Lab work reviewed HBA1C: 5.9 Total chol 127, LDL 57  Lost her mother, stroke  lost her son  likely secondary to acute MI.  Husband with severe heart disease, LVAD, died  TKR in early 12-24-2017 History of diverticulitis  CT chest May 2019 coronary calcium , moderate in nature LAD Aortic plaque, mild to moderate/arch  Echocardiogram May 2010 showed normal LV systolic function, mild MR and TR  Echocardiogram stress test May 2010 was essentially normal. She exercised for 3 minutes achieved 4.6 METs, peak heart rate 151 beats per minute. No EKG changes concerning for ischemia. Echocardiogram showed no ischemia   Followup note from cardiology 09-May-2011suggested she have fatigue from low-dose beta blocker. She was on lisinopril 20 mg at that time.    She does report having a family history. Mother was a smoker, had a stroke in her early 55s. Father had MI in his late 94s was not a smoker  PMH:   has a past medical history of Allergic rhinitis, Anemia, Anxiety, Cellulitis, Cholelithiasis, Essential hypertension, History of stress test, Insomnia, Osteoarthritis, Tachycardia, and Valvular heart disease.  PSH:    Past Surgical History:  Procedure Laterality Date   BACK SURGERY     BUNIONECTOMY     CATARACT EXTRACTION W/PHACO Right 05/18/2019   Procedure: CATARACT EXTRACTION PHACO AND INTRAOCULAR LENS PLACEMENT (IOC) RIGHT MALYUGIN  01:06.5  20.6%  13.72;  Surgeon: Lockie Mola, MD;  Location: [facility];  Service: Ophthalmology;  Laterality: Right;  please leave patient arrival 10   CATARACT EXTRACTION W/PHACO Left 06/08/2019   Procedure: CATARACT EXTRACTION PHACO AND INTRAOCULAR LENS PLACEMENT (IOC) LEFT  00:51.0  20.3%  10.37;  Surgeon: Lockie Mola, MD;  Location: [facility];  Service: Ophthalmology;  Laterality: Left;  ARRIVAL 10:30 PLEASE LEAVE   CHOLECYSTECTOMY  03/29/2007   GALLBLADDER SURGERY     HIP SURGERY     bilateral    REPLACEMENT TOTAL KNEE Right    THUMB ARTHROSCOPY      Current Outpatient Medications  Medication Sig Dispense Refill   acetaminophen (TYLENOL) 500 MG tablet Take 1,000 mg by mouth every 8 (eight) hours as needed for mild pain or moderate pain.     ALPRAZolam (XANAX) 0.25 MG tablet Take 1 tablet (0.25 mg total) by  mouth daily as needed for anxiety. 30 tablet 0   amLODipine (NORVASC) 2.5 MG tablet TAKE ONE TABLET EVERY DAY 90 tablet 1   aspirin 81 MG EC tablet Take 81 mg by mouth daily. Swallow whole.     atorvastatin (LIPITOR) 10 MG tablet TAKE 1 TABLET BY MOUTH DAILY 90 tablet 0   cholecalciferol (VITAMIN D) 1000 UNITS tablet Take 1,000 Units by mouth every other day.      cyanocobalamin 100 MCG tablet Take 100 mcg by mouth daily.     sertraline (ZOLOFT) 100 MG tablet TAKE  1 TABLET BY MOUTH DAILY 30 tablet 2   triamterene-hydrochlorothiazide (MAXZIDE-25) 37.5-25 MG tablet TAKE ONE TABLET EVERY DAY 90 tablet 1   No current facility-administered medications for this visit.     Allergies:   Albumen, egg; Clindamycin/lincomycin; Eggs or egg-derived products; Flagyl [metronidazole]; Levaquin [levofloxacin]; Mobic [meloxicam]; Penicillins; and Sulfa antibiotics   Social History:  The patient  reports that she has never smoked. She has never used smokeless tobacco. She reports current alcohol use. She reports that she does not use drugs.   Family History:   family history includes CAD in her father; Heart attack in her father; Heart attack (age of onset: 68) in her son; Heart disease in her father and mother; Hypertension in her mother and son; Stroke in her father and mother.    Review of Systems: Review of Systems  Constitutional: Negative.   Respiratory: Negative.    Cardiovascular: Negative.   Gastrointestinal: Negative.   Musculoskeletal: Negative.   Neurological: Negative.   Psychiatric/Behavioral: Negative.    All other systems reviewed and are negative.  PHYSICAL EXAM: VS:  BP 136/60 (BP Location: Left Arm, Patient Position: Sitting, Cuff Size: Normal)   Pulse 77   Ht 5\' 1"  (1.549 m)   Wt 148 lb (67.1 kg)   SpO2 98%   BMI 27.96 kg/m  , BMI Body mass index is 27.96 kg/m. Constitutional:  oriented to person, place, and time. No distress.  HENT:  Head: Grossly normal Eyes:  no discharge. No scleral icterus.  Neck: No JVD, no carotid bruits  Cardiovascular: Regular rate and rhythm, no murmurs appreciated Pulmonary/Chest: Clear to auscultation bilaterally, no wheezes or rails Abdominal: Soft.  no distension.  no tenderness.  Musculoskeletal: Normal range of motion Neurological:  normal muscle tone. Coordination normal. No atrophy Skin: Skin warm and dry Psychiatric: normal affect, pleasant   Recent Labs: 04/02/2021: ALT 12; BUN 28;  Creatinine, Ser 1.04; Hemoglobin 12.4; Platelets 271.0; Potassium 3.6; Sodium 139; TSH 1.57    Lipid Panel    Wt Readings from Last 3 Encounters:  04/09/21 148 lb (67.1 kg)  04/02/21 147 lb 12.8 oz (67 kg)  12/04/20 151 lb 6.4 oz (68.7 kg)      ASSESSMENT AND PLAN:  Valvular heart disease -  Minimal aortic valve disease, likely sclerosis without stenosis Minimal murmur on exam today,stable  Coronary calcification on CT scan Currently with no symptoms of angina. No further workup at this time.  Continue current medication regimen.  Mixed hyperlipidemia - Plan: EKG 12-Lead Cholesterol is at goal on the current lipid regimen. No changes to the medications were made.  Essential hypertension - Plan: EKG 12-Lead Blood pressure is well controlled on today's visit. No changes made to the medications.  Edema extremities - Plan: EKG 12-Lead Minimal   Aortic atherosclerosis Seen on CT scan images Stressed the importance of aggressive lipid management   Total encounter time more than 25 minutes  Greater than 50% was spent in counseling and coordination of care with the patient   No orders of the defined types were placed in this encounter.    Signed, Dossie Arbour, M.D., Ph.D. 04/09/2021  [facility] [place], [place] [phone]

## 2021-06-06 ENCOUNTER — Ambulatory Visit: Payer: Medicare PPO

## 2021-06-20 ENCOUNTER — Encounter: Payer: Medicare PPO | Admitting: Internal Medicine

## 2021-07-09 DIAGNOSIS — M159 Polyosteoarthritis, unspecified: Secondary | ICD-10-CM | POA: Diagnosis not present

## 2021-07-09 DIAGNOSIS — G8929 Other chronic pain: Secondary | ICD-10-CM | POA: Diagnosis not present

## 2021-07-09 DIAGNOSIS — M25562 Pain in left knee: Secondary | ICD-10-CM | POA: Diagnosis not present

## 2021-07-09 DIAGNOSIS — M25512 Pain in left shoulder: Secondary | ICD-10-CM | POA: Diagnosis not present

## 2021-07-10 ENCOUNTER — Ambulatory Visit: Payer: Medicare PPO | Admitting: Internal Medicine

## 2021-07-15 ENCOUNTER — Telehealth: Payer: Self-pay | Admitting: Cardiovascular Disease

## 2021-07-15 ENCOUNTER — Telehealth: Payer: Self-pay | Admitting: Internal Medicine

## 2021-07-15 NOTE — Telephone Encounter (Signed)
STAT if patient feels like he/she is going to faint   Are you dizzy now? Every now and again gets a wave of lightheadedness or dizziness   Do you feel faint or have you passed out? no  Do you have any other symptoms? no  Have you checked your HR and BP (record if available)? Hasn't checked

## 2021-07-15 NOTE — Telephone Encounter (Signed)
Was able to return call to Alicia Mccann, she reports "waves of dizzy spells", pt reports "they just happen". Has not noticed change with BP/HR,  but no numbers to report at this time.   Asked if dizziness occurs with changing positions or from sitting to standing?, pt denies, st they notice randomly when these dizzy spells occurs "I don't really noticed them until I think about it".   Suggested would need some BP/HR numbers to correlate if dizziness happens when sitting to standing, otherwise could be something else as vertigo or inner ear infection, also life stressors.   Pt reports has been "worrying a lot lately over family and friends, especially grandchildren". Pt reports is not living alone, her dog of 16 years recently passed, and she is having trouble around the house. Advised worrying and stress can cause dizziness at times.   Pt reports she has an appt with Dr. Carron Curie later this week and has called her office as well regarding the dizziness. Advised to keep that appt, and monitor HR/BP as well. Alicia Mccann very thankful for the return call and information, will call back if dizziness seems to be with BP and HR changes or if Dr. Lorin Picket advised after appt.

## 2021-07-15 NOTE — Telephone Encounter (Signed)
Patient confirmed that she is not actively having these symptoms. Not having any acute symptoms at this time. She has had "swimmy headed" spells over the last month or so but she has also been under a lot of stress. Patient was very emotional on the phone. She lost her dog that was with her for 16-17 years and has had a hard time. Confirmed she is ok with keeping her appt on Friday. Did not feel that she needed to be seen sooner. She called because one of her friends told her she needed to let her doctor know about her "spells". Advised if she starts having any acute symptoms- needs to be evaluated sooner but otherwise, needs to keep appt to discuss increased stress, etc. Patient gave verbal understanding.

## 2021-07-15 NOTE — Telephone Encounter (Signed)
Pt called in complaining about her feeling light headed and dizzy. Pt thinks it might be her blood pressure. Pt stated that she has an appt with Dr. Lorin Picket on Friday 07/19/2021 at 11am. Transfer pt over to access nurse.

## 2021-07-18 ENCOUNTER — Other Ambulatory Visit: Payer: Self-pay | Admitting: Internal Medicine

## 2021-07-19 ENCOUNTER — Encounter: Payer: Self-pay | Admitting: Internal Medicine

## 2021-07-19 ENCOUNTER — Other Ambulatory Visit: Payer: Self-pay

## 2021-07-19 ENCOUNTER — Telehealth: Payer: Self-pay | Admitting: Internal Medicine

## 2021-07-19 ENCOUNTER — Ambulatory Visit: Payer: Medicare PPO | Admitting: Internal Medicine

## 2021-07-19 VITALS — BP 148/82 | HR 72 | Temp 97.9°F | Resp 16 | Ht 61.0 in | Wt 151.2 lb

## 2021-07-19 DIAGNOSIS — I700 Atherosclerosis of aorta: Secondary | ICD-10-CM

## 2021-07-19 DIAGNOSIS — F339 Major depressive disorder, recurrent, unspecified: Secondary | ICD-10-CM | POA: Diagnosis not present

## 2021-07-19 DIAGNOSIS — N183 Chronic kidney disease, stage 3 unspecified: Secondary | ICD-10-CM

## 2021-07-19 DIAGNOSIS — M159 Polyosteoarthritis, unspecified: Secondary | ICD-10-CM | POA: Diagnosis not present

## 2021-07-19 DIAGNOSIS — G5600 Carpal tunnel syndrome, unspecified upper limb: Secondary | ICD-10-CM

## 2021-07-19 DIAGNOSIS — R55 Syncope and collapse: Secondary | ICD-10-CM | POA: Diagnosis not present

## 2021-07-19 DIAGNOSIS — E782 Mixed hyperlipidemia: Secondary | ICD-10-CM | POA: Diagnosis not present

## 2021-07-19 DIAGNOSIS — I10 Essential (primary) hypertension: Secondary | ICD-10-CM

## 2021-07-19 DIAGNOSIS — R739 Hyperglycemia, unspecified: Secondary | ICD-10-CM | POA: Diagnosis not present

## 2021-07-19 DIAGNOSIS — F419 Anxiety disorder, unspecified: Secondary | ICD-10-CM

## 2021-07-19 DIAGNOSIS — R42 Dizziness and giddiness: Secondary | ICD-10-CM

## 2021-07-19 LAB — BASIC METABOLIC PANEL
BUN: 32 mg/dL — ABNORMAL HIGH (ref 6–23)
CO2: 29 mEq/L (ref 19–32)
Calcium: 10 mg/dL (ref 8.4–10.5)
Chloride: 101 mEq/L (ref 96–112)
Creatinine, Ser: 1.06 mg/dL (ref 0.40–1.20)
GFR: 47.69 mL/min — ABNORMAL LOW (ref >60.00)
Glucose, Bld: 92 mg/dL (ref 70–99)
Potassium: 3.5 mEq/L (ref 3.5–5.1)
Sodium: 138 mEq/L (ref 135–145)

## 2021-07-19 LAB — LIPID PANEL
Cholesterol: 154 mg/dL (ref 0–200)
HDL: 59.7 mg/dL (ref >39.00)
LDL Cholesterol: 75 mg/dL (ref 0–99)
NonHDL: 94.58
Total CHOL/HDL Ratio: 3
Triglycerides: 100 mg/dL (ref 0.0–149.0)
VLDL: 20 mg/dL (ref 0.0–40.0)

## 2021-07-19 LAB — HEPATIC FUNCTION PANEL
ALT: 14 U/L (ref 0–35)
AST: 14 U/L (ref 0–37)
Albumin: 4.4 g/dL (ref 3.5–5.2)
Alkaline Phosphatase: 74 U/L (ref 39–117)
Bilirubin, Direct: 0.1 mg/dL (ref 0.0–0.3)
Total Bilirubin: 0.7 mg/dL (ref 0.2–1.2)
Total Protein: 6.9 g/dL (ref 6.0–8.3)

## 2021-07-19 LAB — HEMOGLOBIN A1C: Hgb A1c MFr Bld: 6.1 % (ref 4.6–6.5)

## 2021-07-19 MED ORDER — SERTRALINE HCL 100 MG PO TABS: ORAL_TABLET | ORAL | 1 refills | Status: DC

## 2021-07-19 NOTE — Telephone Encounter (Signed)
Left detailed message for patient to inform of the above. If further questions she is to return call back.

## 2021-07-19 NOTE — Progress Notes (Signed)
Patient ID: Alicia Mccann, female   DOB: 1933-07-03, 87 y.o.   MRN: [id number]   Subjective:    Patient ID: Alicia Mccann, female    DOB: 07/17/1933, 87 y.o.   MRN: [id number]  This visit occurred during the SARS-CoV-2 public health emergency.  Safety protocols were in place, including screening questions prior to the visit, additional usage of staff PPE, and extensive cleaning of exam room while observing appropriate contact time as indicated for disinfecting solutions.   Patient here for a scheduled follow up.   Chief Complaint  Patient presents with   Loss of Consciousness   .   HPI Has noticed intermittent lightheadedness.  States that the Wednesday before Thanksgiving, she went to the [employer].  Walked out to the parking lot and apparently passed out.  States she remembers two people walking towards her. Apparently they helped her down to the curb.  She denied falling.  Denied hitting her head or any injury.  Very brief episode.  Apparently drove home.  No further syncopal or near syncopal episodes, but has had continued intermittent fuzzy sensations in her head.  No chest pain or sob.  No increased heart rate or palpitations.  No nausea or vomiting.  Eating.  Increased stress.  Her dog of 16-17 years recently passed.  States it was just the two of them for years.  Having a hard time coping with this loss.  This has contributed to increased stress.  Discussed.  States has friends she can talk to.  Is not sure if the increased stress has contributed to the above sensation.  Feels may need something more to help level things out.    Past Medical History:  Diagnosis Date   Allergic rhinitis    Anemia    Anxiety    Cellulitis    Cholelithiasis    Essential hypertension    History of stress test    a. 12/2008 Ex MV: EF 78%, no ischemia.   Insomnia    Osteoarthritis    Tachycardia    Valvular heart disease    a. 12/2008 Echo: EF 65%, mild MR;  b. 2/6 SEM RUSB - insignificant  murmur, prob Ao Sclerosis.   Past Surgical History:  Procedure Laterality Date   BACK SURGERY     BUNIONECTOMY     CATARACT EXTRACTION W/PHACO Right 05/18/2019   Procedure: CATARACT EXTRACTION PHACO AND INTRAOCULAR LENS PLACEMENT (IOC) RIGHT MALYUGIN  01:06.5  20.6%  13.72;  Surgeon: Leandrew Koyanagi, MD;  Location: [place];  Service: Ophthalmology;  Laterality: Right;  please leave patient arrival 10   CATARACT EXTRACTION W/PHACO Left 06/08/2019   Procedure: CATARACT EXTRACTION PHACO AND INTRAOCULAR LENS PLACEMENT (IOC) LEFT  00:51.0  20.3%  10.37;  Surgeon: Leandrew Koyanagi, MD;  Location: [place];  Service: Ophthalmology;  Laterality: Left;  ARRIVAL 10:30 PLEASE LEAVE   CHOLECYSTECTOMY  03/29/2007   GALLBLADDER SURGERY     HIP SURGERY     bilateral    REPLACEMENT TOTAL KNEE Right    THUMB ARTHROSCOPY     Family History  Problem Relation Age of Onset   Heart attack Father    Stroke Father    Heart disease Father    CAD Father    Heart attack Son 68       MI   Hypertension Son    Hypertension Mother    Heart disease Mother    Stroke Mother    Breast cancer Neg Hx  Social History   Socioeconomic History   Marital status: Widowed    Spouse name: Not on file   Number of children: Not on file   Years of education: Not on file   Highest education level: Not on file  Occupational History   Not on file  Tobacco Use   Smoking status: Never   Smokeless tobacco: Never  Vaping Use   Vaping Use: Never used  Substance and Sexual Activity   Alcohol use: Yes    Comment: 2 oz QOD   Drug use: No   Sexual activity: Not Currently  Other Topics Concern   Not on file  Social History Narrative   Not on file   Social Determinants of Health   Financial Resource Strain: Not on file  Food Insecurity: Not on file  Transportation Needs: Not on file  Physical Activity: Not on file  Stress: Not on file  Social Connections: Not on file     Review  of Systems  Constitutional:  Negative for appetite change and unexpected weight change.  HENT:  Negative for congestion and sinus pressure.   Respiratory:  Negative for cough, chest tightness and shortness of breath.   Cardiovascular:  Negative for chest pain, palpitations and leg swelling.  Gastrointestinal:  Negative for abdominal pain, diarrhea, nausea and vomiting.  Genitourinary:  Negative for difficulty urinating and dysuria.  Musculoskeletal:  Negative for joint swelling and myalgias.  Skin:  Negative for color change and rash.  Neurological:  Negative for headaches.       Light headedness/funny headed sensation as outlined.    Psychiatric/Behavioral:  Negative for agitation.        Increased stress as outlined.        Objective:     BP (!) 148/82   Pulse 72   Temp 97.9 F (36.6 C) (Oral)   Resp 16   Ht '5\' 1"'  (1.549 m)   Wt 151 lb 4 oz (68.6 kg)   SpO2 98%   BMI 28.58 kg/m  Wt Readings from Last 3 Encounters:  07/19/21 151 lb 4 oz (68.6 kg)  04/09/21 148 lb (67.1 kg)  04/02/21 147 lb 12.8 oz (67 kg)    Physical Exam Vitals reviewed.  Constitutional:      General: She is not in acute distress.    Appearance: Normal appearance.  HENT:     Head: Normocephalic and atraumatic.     Right Ear: External ear normal.     Left Ear: External ear normal.  Eyes:     General: No scleral icterus.       Right eye: No discharge.        Left eye: No discharge.     Conjunctiva/sclera: Conjunctivae normal.  Neck:     Thyroid: No thyromegaly.  Cardiovascular:     Rate and Rhythm: Normal rate and regular rhythm.  Pulmonary:     Effort: No respiratory distress.     Breath sounds: Normal breath sounds. No wheezing.  Abdominal:     General: Bowel sounds are normal.     Palpations: Abdomen is soft.     Tenderness: There is no abdominal tenderness.  Musculoskeletal:        General: No swelling or tenderness.     Cervical back: Neck supple. No tenderness.  Lymphadenopathy:      Cervical: No cervical adenopathy.  Skin:    Findings: No erythema or rash.  Neurological:     Mental Status: She is alert.  Psychiatric:  Mood and Affect: Mood normal.        Behavior: Behavior normal.     Outpatient Encounter Medications as of 07/19/2021  Medication Sig   acetaminophen (TYLENOL) 500 MG tablet Take 1,000 mg by mouth every 8 (eight) hours as needed for mild pain or moderate pain.   ALPRAZolam (XANAX) 0.25 MG tablet Take 1 tablet (0.25 mg total) by mouth daily as needed for anxiety.   amLODipine (NORVASC) 2.5 MG tablet Take 1 tablet (2.5 mg total) by mouth daily.   aspirin 81 MG EC tablet Take 81 mg by mouth daily. Swallow whole.   atorvastatin (LIPITOR) 10 MG tablet Take 1 tablet (10 mg total) by mouth daily.   cholecalciferol (VITAMIN D) 1000 UNITS tablet Take 1,000 Units by mouth every other day.    cyanocobalamin 100 MCG tablet Take 100 mcg by mouth daily.   triamterene-hydrochlorothiazide (MAXZIDE-25) 37.5-25 MG tablet Take 1 tablet by mouth daily.   [DISCONTINUED] sertraline (ZOLOFT) 100 MG tablet TAKE 1 TABLET BY MOUTH DAILY   No facility-administered encounter medications on file as of 07/19/2021.     Lab Results  Component Value Date   WBC 9.8 04/02/2021   HGB 12.4 04/02/2021   HCT 37.9 04/02/2021   PLT 271.0 04/02/2021   GLUCOSE 92 07/19/2021   CHOL 154 07/19/2021   TRIG 100.0 07/19/2021   HDL 59.70 07/19/2021   LDLDIRECT 66.0 09/07/2018   LDLCALC 75 07/19/2021   ALT 14 07/19/2021   AST 14 07/19/2021   NA 138 07/19/2021   K 3.5 07/19/2021   CL 101 07/19/2021   CREATININE 1.06 07/19/2021   BUN 32 (H) 07/19/2021   CO2 29 07/19/2021   TSH 1.57 04/02/2021   INR 0.9 01/16/2009   HGBA1C 6.1 07/19/2021    MM 3D SCREEN BREAST BILATERAL  Result Date: 06/26/2020 CLINICAL DATA:  Screening. EXAM: DIGITAL SCREENING BILATERAL MAMMOGRAM WITH TOMO AND CAD COMPARISON:  Previous exam(s). ACR Breast Density Category b: There are scattered areas of  fibroglandular density. FINDINGS: There are no findings suspicious for malignancy. Images were processed with CAD. IMPRESSION: No mammographic evidence of malignancy. A result letter of this screening mammogram will be mailed directly to the patient. RECOMMENDATION: Screening mammogram in one year. (Code:SM-B-01Y) BI-RADS CATEGORY  1: Negative. Electronically Signed   By: Lajean Manes M.D.   On: 06/26/2020 09:47       Assessment & Plan:   Problem List Items Addressed This Visit     Anxiety    Increased stress and anxiety as outlined.  On zoloft.  Discussed.  Has friends to talk to.  Increased zoloft to 143m q day.  Follow.        Aortic atherosclerosis (HCC)    Continue atorvastatin.       Carpal tunnel syndrome    Has seen Dr SPeggye Ley  Recently saw Dr KJefm Bryant        CKD (chronic kidney disease) stage 3, GFR 30-59 ml/min (HCC)    Continue to avoid antiinflammatories.  Stay hydrated.  Follow metabolic panel.       Depression, recurrent ([place]    Depression and increased stress as outlined.  Discussed.  Unclear if contributing to her current episodes.  On zoloft.  Will increase to 1517mq day.  Follow closely.  Get hre back in soon to reassess.        Essential hypertension    Blood pressure elevated.  Recheck improved - still above goal.  Increased stress.  Treat stress.  Hold  on additional medication at this time.  Have her spot check pressure.  Get her back in soon to reassess.  Continue triam/hctz and amlodipine.        Relevant Orders   Basic metabolic panel (Completed)   Hyperglycemia    Low carb diet and exercise.  Follow met b and a1c.        Relevant Orders   Hemoglobin A1c (Completed)   Light headedness    Had the syncopal episode as outlined.  Intermittent lightheadedness. Discussed possible etiologies.  Blood sugar today 103. Discussed importance of staying hydrated.  Eat regular meals.  Treat anxiety - increase zoloft to 155m q day.  EKG SR with no acute ischemic  changes.  Blood pressure on recheck improved but still above goal.  Feel related to above.  Hold on making changes with her medication.  Have her spot check her pressures.  Discussed head scan.  Wants to hold.  Check routine labs.  Given syncopal episode, discussed f/u with cardiology to confirm no further w/up warranted (possible monitor, etc).        Mixed hyperlipidemia    On crestor.  Low cholesterol diet and exercise.  Follow lipid panel and liver function tests.        Relevant Orders   Lipid panel (Completed)   Hepatic function panel (Completed)   Osteoarthritis    Recently evaluated by Dr KJefm Bryantfor shoulder pain (left).  Degenerative arthritis.  S/p injection.       Syncope - Primary    Had the syncopal episode as outlined.  Continued lightheadedness.  Discussed possible etiologies.  Not orthostatic on exam.  Blood pressure elevated.  Hold on making changes in medication.  Follow pressures.  Treat anxiety - increased zoloft as outlined.  Stay hydrated.  Eat regular meals.  EKG - SR no acute ischemic changes.  Given persistent intermittent lightheadedness and syncopal episode, will have cardiology f/u to confirm if any further cardiac w/up warranted.       Relevant Orders   EKG 12-Lead (Completed)    I spent 45 minutes with the patient and more than 50% of the time was spent in consultation regarding the above.   Time spent discussing her current symptoms and concerns.  Time also spent discussing further w/up, evaluation and treatment.   CEinar Pheasant MD

## 2021-07-19 NOTE — Telephone Encounter (Signed)
Please call and confirm that she is taking zoloft 100mg  q day.  If so, then notify her that I am going to increase her zoloft to 150mg  q day.  She will take 100mg  - 1and 1/2 tablet - q day.

## 2021-07-20 ENCOUNTER — Encounter: Payer: Self-pay | Admitting: Internal Medicine

## 2021-07-20 DIAGNOSIS — R55 Syncope and collapse: Secondary | ICD-10-CM | POA: Insufficient documentation

## 2021-07-20 NOTE — Assessment & Plan Note (Signed)
Depression and increased stress as outlined.  Discussed.  Unclear if contributing to her current episodes.  On zoloft.  Will increase to 150mg  q day.  Follow closely.  Get hre back in soon to reassess.

## 2021-07-20 NOTE — Assessment & Plan Note (Signed)
Low carb diet and exercise.  Follow met b and a1c.   

## 2021-07-20 NOTE — Assessment & Plan Note (Signed)
Recently evaluated by Dr Gavin Potters for shoulder pain (left).  Degenerative arthritis.  S/p injection.

## 2021-07-20 NOTE — Telephone Encounter (Signed)
Called and confirm pt aware of dose change - zoloft.  She will take 100mg  - 1and 1/2 tablet per day.  Will call if problems of if needs anything.

## 2021-07-20 NOTE — Assessment & Plan Note (Signed)
Has seen Dr Stephenie Acres.  Recently saw Dr Gavin Potters.

## 2021-07-20 NOTE — Assessment & Plan Note (Addendum)
Had the syncopal episode as outlined.  Intermittent lightheadedness. Discussed possible etiologies.  Blood sugar today 103. Discussed importance of staying hydrated.  Eat regular meals.  Treat anxiety - increase zoloft to 150mg  q day.  EKG SR with no acute ischemic changes.  Blood pressure on recheck improved but still above goal.  Feel related to above.  Hold on making changes with her medication.  Have her spot check her pressures.  Discussed head scan.  Wants to hold.  Check routine labs.  Given syncopal episode, discussed f/u with cardiology to confirm no further w/up warranted (possible monitor, etc).

## 2021-07-20 NOTE — Assessment & Plan Note (Signed)
Blood pressure elevated.  Recheck improved - still above goal.  Increased stress.  Treat stress.  Hold on additional medication at this time.  Have her spot check pressure.  Get her back in soon to reassess.  Continue triam/hctz and amlodipine.

## 2021-07-20 NOTE — Assessment & Plan Note (Signed)
Had the syncopal episode as outlined.  Continued lightheadedness.  Discussed possible etiologies.  Not orthostatic on exam.  Blood pressure elevated.  Hold on making changes in medication.  Follow pressures.  Treat anxiety - increased zoloft as outlined.  Stay hydrated.  Eat regular meals.  EKG - SR no acute ischemic changes.  Given persistent intermittent lightheadedness and syncopal episode, will have cardiology f/u to confirm if any further cardiac w/up warranted.

## 2021-07-20 NOTE — Assessment & Plan Note (Signed)
Continue to avoid antiinflammatories.  Stay hydrated.  Follow metabolic panel.  

## 2021-07-20 NOTE — Assessment & Plan Note (Signed)
Continue atorvastatin

## 2021-07-20 NOTE — Assessment & Plan Note (Signed)
Increased stress and anxiety as outlined.  On zoloft.  Discussed.  Has friends to talk to.  Increased zoloft to 150mg  q day.  Follow.

## 2021-07-20 NOTE — Assessment & Plan Note (Signed)
On crestor.  Low cholesterol diet and exercise.  Follow lipid panel and liver function tests.   

## 2021-08-01 NOTE — Telephone Encounter (Signed)
Patient is still having issues with her sertraline (ZOLOFT) 100 MG tablet. She is suppose to take 1 1/2 pills and it was making her loopy. She is taking just taking the 1 pill.

## 2021-08-02 NOTE — Telephone Encounter (Signed)
Yes.  If she is feeling better on the 100mg  - continue on the 100mg  dose.  Cardiology should be contacting her for f/u as well (just let her know).

## 2021-08-02 NOTE — Telephone Encounter (Signed)
Patient stated that she took the 1 1/2 tablet of zoloft for a few days. It made her feel foggy. She has only taken the zoloft 100 mg for the last 2 days and has not has any "foggy" feelings. Patient just wanted to make sure she is ok to stay on her original dose.

## 2021-08-02 NOTE — Telephone Encounter (Signed)
Patient is aware of below. 

## 2021-08-06 ENCOUNTER — Telehealth: Payer: Self-pay | Admitting: Internal Medicine

## 2021-08-06 NOTE — Telephone Encounter (Signed)
Patient called in have cough , fever 99.5 , appetite change, Patient think may have covid  xfer access Nurse

## 2021-08-06 NOTE — Telephone Encounter (Signed)
Called patient. Phone kept ringing

## 2021-08-06 NOTE — Telephone Encounter (Signed)
[facility] Nurse sent patient back to office to be seen in the next few days. A telephone appointment schedule with Flinchum on 08/07/2021 @ 3pm.

## 2021-08-07 ENCOUNTER — Ambulatory Visit (INDEPENDENT_AMBULATORY_CARE_PROVIDER_SITE_OTHER): Payer: Medicare PPO | Admitting: Adult Health

## 2021-08-07 ENCOUNTER — Encounter: Payer: Self-pay | Admitting: Adult Health

## 2021-08-07 VITALS — Temp 100.3°F | Ht 60.98 in | Wt 151.0 lb

## 2021-08-07 DIAGNOSIS — U071 COVID-19: Secondary | ICD-10-CM

## 2021-08-07 DIAGNOSIS — R0981 Nasal congestion: Secondary | ICD-10-CM | POA: Diagnosis not present

## 2021-08-07 MED ORDER — MOLNUPIRAVIR EUA 200MG CAPSULE: 4.0000 | ORAL_CAPSULE | Freq: Two times a day (BID) | ORAL | 0 refills | Status: AC

## 2021-08-07 NOTE — Progress Notes (Signed)
Placed call to pt to schedule follow up. Got the busy signal. Unable to leave VM

## 2021-08-07 NOTE — Progress Notes (Signed)
Virtual Visit via Telephone Note  I connected with Alicia Mccann on 08/07/21 at  3:00 PM EST by telephone and verified that I am speaking with the correct person using two identifiers.  Location: Patient: at home  Provider: Provider: Provider's office at  [facility], [place].      I discussed the limitations, risks, security and privacy concerns of performing an evaluation and management service by telephone and the availability of in person appointments. I also discussed with the patient that there may be a patient responsible charge related to this service. The patient expressed understanding and agreed to proceed.   History of Present Illness: Patient calling, she tested positive for covid on 08/06/21. Patient has fatigue, she is tired people have been checking on her all day.  She has not had time to take her temperature again. She reports it was 101 this morning. Feels fever is gone now.  Mild diarrhea.  Denies any problems walking around. No cough. Nasal congestion. Post nasal congestion.  Patient  denies any fever, body aches,chills, rash, chest pain, shortness of breath, nausea, vomiting, or diarrhea.    Past Medical History:  Diagnosis Date   Allergic rhinitis    Anemia    Anxiety    Cellulitis    Cholelithiasis    Essential hypertension    History of stress test    a. 12/2008 Ex MV: EF 78%, no ischemia.   Insomnia    Osteoarthritis    Tachycardia    Valvular heart disease    a. 12/2008 Echo: EF 65%, mild MR;  b. 2/6 SEM RUSB - insignificant murmur, prob Ao Sclerosis.    Allergies  Allergen Reactions   Albumen, Egg    Clindamycin/Lincomycin    Eggs Or Egg-Derived Products    Flagyl [Metronidazole]    Levaquin [Levofloxacin]    Mobic [Meloxicam]     GI issues    Penicillins    Sulfa Antibiotics     Observations/Objective:   Patient is alert and oriented and responsive to questions Engages in conversation with provider.  Speaks in full sentences without any pauses without any shortness of breath or distress.   Assessment and Plan:  Positive self-administered antigen test for COVID-19 - Plan: molnupiravir EUA (LAGEVRIO) 200 mg CAPS capsule  Nasal congestion  Discussed FDA emergency authorization use for Molnupiravir and possible side effects.  Follow Up Instructions:  Advised in person evaluation at anytime is advised if any symptoms do not improve, worsen or change at any given time.  Red Flags discussed. The patient was given clear instructions to go to ER or return to medical center if any red flags develop, symptoms do not improve, worsen or new problems develop. They verbalized understanding.     I discussed the assessment and treatment plan with the patient. The patient was provided an opportunity to ask questions and all were answered. The patient agreed with the plan and demonstrated an understanding of the instructions.   The patient was advised to call back or seek an in-person evaluation if the symptoms worsen or if the condition fails to improve as anticipated.  I provided 22 minutes of non-face-to-face time during this encounter.   Jairo Ben, FNP

## 2021-08-09 ENCOUNTER — Other Ambulatory Visit: Payer: Self-pay

## 2021-08-09 ENCOUNTER — Encounter: Payer: Self-pay | Admitting: Emergency Medicine

## 2021-08-09 ENCOUNTER — Emergency Department: Payer: Medicare PPO

## 2021-08-09 ENCOUNTER — Observation Stay
Admission: EM | Admit: 2021-08-09 | Discharge: 2021-08-10 | Disposition: A | Discharge status: Home / Self Care | Payer: Medicare PPO | Attending: Internal Medicine | Admitting: Internal Medicine

## 2021-08-09 ENCOUNTER — Telehealth: Payer: Self-pay

## 2021-08-09 DIAGNOSIS — F419 Anxiety disorder, unspecified: Secondary | ICD-10-CM | POA: Diagnosis not present

## 2021-08-09 DIAGNOSIS — R0689 Other abnormalities of breathing: Secondary | ICD-10-CM | POA: Diagnosis not present

## 2021-08-09 DIAGNOSIS — F339 Major depressive disorder, recurrent, unspecified: Secondary | ICD-10-CM | POA: Diagnosis present

## 2021-08-09 DIAGNOSIS — F32A Depression, unspecified: Secondary | ICD-10-CM | POA: Insufficient documentation

## 2021-08-09 DIAGNOSIS — I10 Essential (primary) hypertension: Secondary | ICD-10-CM | POA: Diagnosis not present

## 2021-08-09 DIAGNOSIS — Z8616 Personal history of COVID-19: Secondary | ICD-10-CM | POA: Diagnosis present

## 2021-08-09 DIAGNOSIS — R062 Wheezing: Secondary | ICD-10-CM | POA: Diagnosis not present

## 2021-08-09 DIAGNOSIS — R0902 Hypoxemia: Secondary | ICD-10-CM | POA: Diagnosis not present

## 2021-08-09 DIAGNOSIS — N183 Chronic kidney disease, stage 3 unspecified: Secondary | ICD-10-CM | POA: Diagnosis not present

## 2021-08-09 DIAGNOSIS — J9600 Acute respiratory failure, unspecified whether with hypoxia or hypercapnia: Secondary | ICD-10-CM | POA: Diagnosis not present

## 2021-08-09 DIAGNOSIS — R0602 Shortness of breath: Secondary | ICD-10-CM | POA: Diagnosis not present

## 2021-08-09 DIAGNOSIS — Z7982 Long term (current) use of aspirin: Secondary | ICD-10-CM | POA: Insufficient documentation

## 2021-08-09 DIAGNOSIS — J189 Pneumonia, unspecified organism: Secondary | ICD-10-CM | POA: Diagnosis not present

## 2021-08-09 DIAGNOSIS — E876 Hypokalemia: Secondary | ICD-10-CM | POA: Diagnosis present

## 2021-08-09 DIAGNOSIS — E871 Hypo-osmolality and hyponatremia: Secondary | ICD-10-CM | POA: Diagnosis not present

## 2021-08-09 DIAGNOSIS — Z79899 Other long term (current) drug therapy: Secondary | ICD-10-CM | POA: Diagnosis not present

## 2021-08-09 DIAGNOSIS — I129 Hypertensive chronic kidney disease with stage 1 through stage 4 chronic kidney disease, or unspecified chronic kidney disease: Secondary | ICD-10-CM | POA: Diagnosis not present

## 2021-08-09 DIAGNOSIS — U071 COVID-19: Secondary | ICD-10-CM | POA: Diagnosis not present

## 2021-08-09 DIAGNOSIS — R059 Cough, unspecified: Secondary | ICD-10-CM | POA: Diagnosis not present

## 2021-08-09 LAB — MAGNESIUM: Magnesium: 1.9 mg/dL (ref 1.7–2.4)

## 2021-08-09 LAB — BASIC METABOLIC PANEL
Anion gap: 10 (ref 5–15)
BUN: 16 mg/dL (ref 8–23)
CO2: 23 mmol/L (ref 22–32)
Calcium: 9 mg/dL (ref 8.9–10.3)
Chloride: 94 mmol/L — ABNORMAL LOW (ref 98–111)
Creatinine, Ser: 1.18 mg/dL — ABNORMAL HIGH (ref 0.44–1.00)
GFR, Estimated: 45 mL/min — ABNORMAL LOW (ref >60)
Glucose, Bld: 141 mg/dL — ABNORMAL HIGH (ref 70–99)
Potassium: 2.7 mmol/L — CL (ref 3.5–5.1)
Sodium: 127 mmol/L — ABNORMAL LOW (ref 135–145)

## 2021-08-09 LAB — TROPONIN I (HIGH SENSITIVITY)
Troponin I (High Sensitivity): 29 ng/L — ABNORMAL HIGH (ref <18)
Troponin I (High Sensitivity): 28 ng/L — ABNORMAL HIGH (ref <18)

## 2021-08-09 LAB — CBC
HCT: 37.3 % (ref 36.0–46.0)
Hemoglobin: 12.4 g/dL (ref 12.0–15.0)
MCH: 29.2 pg (ref 26.0–34.0)
MCHC: 33.2 g/dL (ref 30.0–36.0)
MCV: 87.8 fL (ref 80.0–100.0)
Platelets: 281 10*3/uL (ref 150–400)
RBC: 4.25 MIL/uL (ref 3.87–5.11)
RDW: 12.8 % (ref 11.5–15.5)
WBC: 12.6 10*3/uL — ABNORMAL HIGH (ref 4.0–10.5)
nRBC: 0 % (ref 0.0–0.2)

## 2021-08-09 LAB — RESP PANEL BY RT-PCR (FLU A&B, COVID) ARPGX2
Influenza A by PCR: NEGATIVE
Influenza B by PCR: NEGATIVE
SARS Coronavirus 2 by RT PCR: NEGATIVE

## 2021-08-09 LAB — D-DIMER, QUANTITATIVE: D-Dimer, Quant: 1.52 ug/mL-FEU — ABNORMAL HIGH (ref 0.00–0.50)

## 2021-08-09 MED ORDER — ALBUTEROL SULFATE HFA 108 (90 BASE) MCG/ACT IN AERS
2.0000 | INHALATION_SPRAY | Freq: Four times a day (QID) | RESPIRATORY_TRACT | Status: DC
  Administered 2021-08-10 (×2): 2 via RESPIRATORY_TRACT
  Filled 2021-08-09: qty 6.7

## 2021-08-09 MED ORDER — SODIUM CHLORIDE 0.9 % IV SOLN: 200.0000 mg | Freq: Once | INTRAVENOUS | Status: DC

## 2021-08-09 MED ORDER — ONDANSETRON HCL 4 MG/2ML IJ SOLN: 4.0000 mg | Freq: Four times a day (QID) | INTRAMUSCULAR | Status: DC | PRN

## 2021-08-09 MED ORDER — ALPRAZOLAM 0.25 MG PO TABS: 0.2500 mg | ORAL_TABLET | Freq: Every day | ORAL | Status: DC | PRN

## 2021-08-09 MED ORDER — ATORVASTATIN CALCIUM 10 MG PO TABS
10.0000 mg | ORAL_TABLET | Freq: Every evening | ORAL | Status: DC
  Administered 2021-08-09: 18:00:00 10 mg via ORAL
  Filled 2021-08-09 (×2): qty 1

## 2021-08-09 MED ORDER — AMLODIPINE BESYLATE 5 MG PO TABS
2.5000 mg | ORAL_TABLET | Freq: Every day | ORAL | Status: DC
  Administered 2021-08-09 – 2021-08-10 (×2): 2.5 mg via ORAL
  Filled 2021-08-09 (×2): qty 1

## 2021-08-09 MED ORDER — SODIUM CHLORIDE 0.9 % IV SOLN
100.0000 mg | Freq: Every day | INTRAVENOUS | Status: DC
  Administered 2021-08-10: 10:00:00 100 mg via INTRAVENOUS
  Filled 2021-08-09: qty 20

## 2021-08-09 MED ORDER — VITAMIN B-12 100 MCG PO TABS
100.0000 ug | ORAL_TABLET | Freq: Every day | ORAL | Status: DC
  Administered 2021-08-10: 11:00:00 100 ug via ORAL
  Filled 2021-08-09 (×2): qty 1

## 2021-08-09 MED ORDER — ASPIRIN EC 81 MG PO TBEC
81.0000 mg | DELAYED_RELEASE_TABLET | Freq: Every day | ORAL | Status: DC
  Administered 2021-08-09 – 2021-08-10 (×2): 81 mg via ORAL
  Filled 2021-08-09 (×2): qty 1

## 2021-08-09 MED ORDER — PREDNISONE 50 MG PO TABS: 50.0000 mg | ORAL_TABLET | Freq: Every day | ORAL | Status: DC

## 2021-08-09 MED ORDER — ALBUTEROL SULFATE (2.5 MG/3ML) 0.083% IN NEBU: 2.5000 mg | INHALATION_SOLUTION | Freq: Four times a day (QID) | RESPIRATORY_TRACT | Status: DC

## 2021-08-09 MED ORDER — SODIUM CHLORIDE 0.9 % IV SOLN
200.0000 mg | Freq: Once | INTRAVENOUS | Status: AC
  Administered 2021-08-09: 18:00:00 200 mg via INTRAVENOUS
  Filled 2021-08-09: qty 200

## 2021-08-09 MED ORDER — POTASSIUM CHLORIDE CRYS ER 20 MEQ PO TBCR
40.0000 meq | EXTENDED_RELEASE_TABLET | Freq: Once | ORAL | Status: AC
  Administered 2021-08-09: 18:00:00 40 meq via ORAL
  Filled 2021-08-09: qty 2

## 2021-08-09 MED ORDER — ASCORBIC ACID 500 MG PO TABS
500.0000 mg | ORAL_TABLET | Freq: Every day | ORAL | Status: DC
  Administered 2021-08-09 – 2021-08-10 (×2): 500 mg via ORAL
  Filled 2021-08-09 (×2): qty 1

## 2021-08-09 MED ORDER — POTASSIUM CHLORIDE CRYS ER 20 MEQ PO TBCR
40.0000 meq | EXTENDED_RELEASE_TABLET | Freq: Two times a day (BID) | ORAL | Status: DC
  Administered 2021-08-10: 09:00:00 40 meq via ORAL
  Filled 2021-08-09: qty 2

## 2021-08-09 MED ORDER — SODIUM CHLORIDE 0.9 % IV SOLN: Freq: Once | INTRAVENOUS | Status: AC

## 2021-08-09 MED ORDER — SODIUM CHLORIDE 0.9 % IV SOLN: 100.0000 mg | Freq: Every day | INTRAVENOUS | Status: DC

## 2021-08-09 MED ORDER — ONDANSETRON HCL 4 MG PO TABS: 4.0000 mg | ORAL_TABLET | Freq: Four times a day (QID) | ORAL | Status: DC | PRN

## 2021-08-09 MED ORDER — ENOXAPARIN SODIUM 30 MG/0.3ML IJ SOSY: 30.0000 mg | PREFILLED_SYRINGE | INTRAMUSCULAR | Status: DC

## 2021-08-09 MED ORDER — METHYLPREDNISOLONE SODIUM SUCC 40 MG IJ SOLR
0.5000 mg/kg | Freq: Two times a day (BID) | INTRAMUSCULAR | Status: DC
  Administered 2021-08-09 – 2021-08-10 (×2): 34.4 mg via INTRAVENOUS
  Filled 2021-08-09 (×2): qty 1

## 2021-08-09 MED ORDER — ZINC SULFATE 220 (50 ZN) MG PO CAPS
220.0000 mg | ORAL_CAPSULE | Freq: Every day | ORAL | Status: DC
  Administered 2021-08-09 – 2021-08-10 (×2): 220 mg via ORAL
  Filled 2021-08-09 (×2): qty 1

## 2021-08-09 MED ORDER — ACETAMINOPHEN 500 MG PO TABS: 1000.0000 mg | ORAL_TABLET | Freq: Three times a day (TID) | ORAL | Status: DC | PRN

## 2021-08-09 MED ORDER — SERTRALINE HCL 50 MG PO TABS
100.0000 mg | ORAL_TABLET | Freq: Every day | ORAL | Status: DC
  Administered 2021-08-09 – 2021-08-10 (×2): 100 mg via ORAL
  Filled 2021-08-09 (×2): qty 2

## 2021-08-09 MED ORDER — GUAIFENESIN-DM 100-10 MG/5ML PO SYRP
10.0000 mL | ORAL_SOLUTION | ORAL | Status: DC | PRN
  Filled 2021-08-09: qty 10

## 2021-08-09 MED ORDER — VITAMIN D 25 MCG (1000 UNIT) PO TABS
1000.0000 [IU] | ORAL_TABLET | ORAL | Status: DC
  Administered 2021-08-10: 09:00:00 1000 [IU] via ORAL
  Filled 2021-08-09: qty 1

## 2021-08-09 MED ORDER — ALBUTEROL SULFATE HFA 108 (90 BASE) MCG/ACT IN AERS: 2.0000 | INHALATION_SPRAY | Freq: Four times a day (QID) | RESPIRATORY_TRACT | Status: DC

## 2021-08-09 NOTE — Progress Notes (Signed)
Remdesivir - Pharmacy Brief Note   O:  ALT: 14 CXR: Bibasilar mixed interstitial and airspace opacification is indicative of COVID pneumonia SpO2: 95% on 4L [place]   A/P:  Remdesivir 200 mg IVPB once followed by 100 mg IVPB daily x 4 days.   Raiford Noble, PharmD Clinical Pharmacist  08/09/2021 3:32 PM

## 2021-08-09 NOTE — ED Triage Notes (Signed)
Pt in via EMS from home with  Pt dx'd with covid Tuesday, started feeling tired a couple of days ago and started with increased RR. Pt was give 1 duoneb and 1 albuterol, #20g to left wrist. EMS administered 125mg  solumedrol 178/84, 92% RA, 98% post treatments, CBG 171

## 2021-08-09 NOTE — Telephone Encounter (Signed)
Reviewed note.  Diagnosed with covid.  If weak, tired and sob - agree with need for evaluation.  (UC, etc - pending symptoms).

## 2021-08-09 NOTE — ED Notes (Signed)
Pt placed on purewick 

## 2021-08-09 NOTE — ED Triage Notes (Signed)
Pt comes into the ED via [facility] from home c/o [facility] and hypoxia.  Pt dx with COVID on Tuesday.  Room air saturation here is at 83%.  Pt presents labored with exertion.  Pt given 1 duoneb, 1 albuterol, 125 solumedrol.

## 2021-08-09 NOTE — ED Notes (Signed)
Dr. Derrill Kay, MD at bedside at this time.

## 2021-08-09 NOTE — Telephone Encounter (Signed)
Patient was evaluated by NP for COVID and given molnupiravir. Tested positive on 08/06/21. While on the phone patient sounded more SOB than her baseline. Complaining of being weak and tired. Has not checked temp today. Advised that she needs to go to the ED. Patient stated that she doesn't have anyone to take her. Patient is calling 911 to have them transport her. Will follow to confirm evaluated.

## 2021-08-09 NOTE — Telephone Encounter (Signed)
Pt arrived at ED

## 2021-08-09 NOTE — ED Notes (Signed)
Pt placed on 4L nasal cannula.  

## 2021-08-09 NOTE — Telephone Encounter (Signed)
Called patient. She stated she had messed in the bathroom floor and was trying to clean it up. Stated that she never heard back from EMTs. Advised that I was calling 911 to have them take her to the hospital. 911 in route. Patient disconnected phone call.

## 2021-08-09 NOTE — ED Provider Notes (Signed)
Emergency Medicine Provider Triage Evaluation Note  Alicia Mccann , a 87 y.o. female  was evaluated in triage.  Pt complains of shortness of breath.  Diagnosed with COVID on Tuesday.  Room air O2 saturation is 93%.  Labored breathing.  Patient was given 1 DuoNeb, 1 albuterol, and 125 Solu-Medrol via EMS.  Review of Systems  Positive: Difficulty breathing, covid positive Negative: Vomiting/diarrhea  Physical Exam  BP 100/79 (BP Location: Left Arm)    Pulse (!) 113    Temp (!) 100.4 F (38 C) (Oral)    Resp (!) 22    SpO2 97%  Gen:   Awake, no distress   Resp:  Normal effort while wearing O2 MSK:   Moves extremities without difficulty  Other:    Medical Decision Making  Medically screening exam initiated at 1:24 PM.  Appropriate orders placed.  Alicia Mccann was informed that the remainder of the evaluation will be completed by another provider, this initial triage assessment does not replace that evaluation, and the importance of remaining in the ED until their evaluation is complete.     Faythe Ghee, PA-C 08/09/21 1325    Concha Se, MD 08/09/21 [phone]

## 2021-08-09 NOTE — H&P (Signed)
History and Physical    Alicia Mccann [id number] DOB: 09/16/1932 DOA: 08/09/2021  PCP: Einar Pheasant, MD   Patient coming from: Home  I have personally briefly reviewed patient's old medical records in [place]  Chief Complaint: Weakness/shortness of breath  HPI: Alicia Mccann is a 87 y.o. female with medical history significant for hypertension and anxiety disorder who presents to the ER for evaluation of worsening shortness of breath over the last couple of days associated with a nonproductive cough and fever. Patient states that her symptoms started 3 days prior to her admission with generalized weakness.  She tested positive from a home test and was given Molnupiravir as an outpatient.  Patient has had 2 doses. Per EMS she had room air pulse oximetry of 92% and received 1 dose of DuoNeb and Solu-Medrol 125 mg IV in the field. She denies having any nausea, no vomiting, no diarrhea, no myalgias, no headache, no dizziness, no lightheadedness, no leg swelling, no blurred vision, no focal deficit, no urinary symptoms. Sodium 127, potassium 2.7, chloride 94, bicarb 23, glucose 141, BUN 16, creatinine 1.18, calcium 9.0, troponin 29, white count 12.6, hemoglobin 12.4, hematocrit 37.3, MCV 87.8, RDW 12.8, platelet count 281 Chest x-ray reviewed by me shows bibasilar mixed interstitial and airspace opacification indicative of COVID-pneumonia.   ED Course: Patient is an 87 year old female who presents to the ER for evaluation of worsening shortness of breath associated with a nonproductive cough and fever.  She tested positive for the COVID-19 virus on 08/06/21.  She is vaccinated and has received 2 booster shots for COVID-19. She had a T-max of 100.4 upon arrival to the ER and was tachycardic and tachypneic. Room air pulse oximetry was 86% and is currently on 2 L of oxygen to maintain pulse oximetry greater than 92% Chest x-ray showed multifocal infiltrates consistent with  COVID-pneumonia. She will be admitted to the hospital for further evaluation.   Review of Systems: As per HPI otherwise all other systems reviewed and negative.    Past Medical History:  Diagnosis Date   Allergic rhinitis    Anemia    Anxiety    Cellulitis    Cholelithiasis    Essential hypertension    History of stress test    a. 12/2008 Ex MV: EF 78%, no ischemia.   Insomnia    Osteoarthritis    Tachycardia    Valvular heart disease    a. 12/2008 Echo: EF 65%, mild MR;  b. 2/6 SEM RUSB - insignificant murmur, prob Ao Sclerosis.    Past Surgical History:  Procedure Laterality Date   BACK SURGERY     BUNIONECTOMY     CATARACT EXTRACTION W/PHACO Right 05/18/2019   Procedure: CATARACT EXTRACTION PHACO AND INTRAOCULAR LENS PLACEMENT (IOC) RIGHT MALYUGIN  01:06.5  20.6%  13.72;  Surgeon: Leandrew Koyanagi, MD;  Location: [place];  Service: Ophthalmology;  Laterality: Right;  please leave patient arrival 10   CATARACT EXTRACTION W/PHACO Left 06/08/2019   Procedure: CATARACT EXTRACTION PHACO AND INTRAOCULAR LENS PLACEMENT (IOC) LEFT  00:51.0  20.3%  10.37;  Surgeon: Leandrew Koyanagi, MD;  Location: [place];  Service: Ophthalmology;  Laterality: Left;  ARRIVAL 10:30 PLEASE LEAVE   CHOLECYSTECTOMY  03/29/2007   GALLBLADDER SURGERY     HIP SURGERY     bilateral    REPLACEMENT TOTAL KNEE Right    THUMB ARTHROSCOPY       reports that she has never smoked. She has never used smokeless  tobacco. She reports current alcohol use. She reports that she does not use drugs.  Allergies  Allergen Reactions   Clindamycin/Lincomycin    Egg White (Egg Protein)    Eggs Or Egg-Derived Products    Flagyl [Metronidazole]    Levaquin [Levofloxacin]    Mobic [Meloxicam]     GI issues    Penicillins    Sulfa Antibiotics     Family History  Problem Relation Age of Onset   Heart attack Father    Stroke Father    Heart disease Father    CAD Father    Heart attack  Son 52       MI   Hypertension Son    Hypertension Mother    Heart disease Mother    Stroke Mother    Breast cancer Neg Hx       Prior to Admission medications   Medication Sig Start Date End Date Taking? Authorizing Provider  acetaminophen (TYLENOL) 500 MG tablet Take 1,000 mg by mouth every 8 (eight) hours as needed for mild pain or moderate pain.   Yes [provider]  ALPRAZolam (XANAX) 0.25 MG tablet Take 1 tablet (0.25 mg total) by mouth daily as needed for anxiety. 12/04/20  Yes Einar Pheasant, MD  amLODipine (NORVASC) 2.5 MG tablet Take 1 tablet (2.5 mg total) by mouth daily. 04/09/21  Yes Minna Merritts, MD  aspirin 81 MG EC tablet Take 81 mg by mouth daily. Swallow whole.   Yes [provider]  atorvastatin (LIPITOR) 10 MG tablet Take 1 tablet (10 mg total) by mouth daily. 04/09/21  Yes Minna Merritts, MD  cholecalciferol (VITAMIN D) 1000 UNITS tablet Take 1,000 Units by mouth every other day.    Yes [provider]  cyanocobalamin 100 MCG tablet Take 100 mcg by mouth daily.   Yes [provider]  molnupiravir EUA (LAGEVRIO) 200 mg CAPS capsule Take 4 capsules (800 mg total) by mouth 2 (two) times daily for 5 days. 08/07/21 08/12/21 Yes Flinchum, Kelby Aline, FNP  sertraline (ZOLOFT) 100 MG tablet Take 1 and 1/2 tablet q day. Patient taking differently: Take 100 mg by mouth daily. 07/19/21  Yes Einar Pheasant, MD  triamterene-hydrochlorothiazide (MAXZIDE-25) 37.5-25 MG tablet Take 1 tablet by mouth daily. 04/09/21  Yes Minna Merritts, MD    Physical Exam: Vitals:   08/09/21 1318 08/09/21 1324 08/09/21 1326 08/09/21 1516  BP: 100/79   (!) 161/84  Pulse: (!) 113   94  Resp: (!) 22   (!) 24  Temp: (!) 100.4 F (38 C)     TempSrc: Oral     SpO2: 97%  92% 95%  Weight:  68.5 kg    Height:  5' (1.524 m)       Vitals:   08/09/21 1318 08/09/21 1324 08/09/21 1326 08/09/21 1516  BP: 100/79   (!) 161/84  Pulse: (!) 113   94  Resp: (!)  22   (!) 24  Temp: (!) 100.4 F (38 C)     TempSrc: Oral     SpO2: 97%  92% 95%  Weight:  68.5 kg    Height:  5' (1.524 m)        Constitutional: Alert and oriented x 3 . Not in any apparent distress HEENT:      Head: Normocephalic and atraumatic.         Eyes: PERLA, EOMI, Conjunctivae are normal. Sclera is non-icteric.       Mouth/Throat: Mucous membranes are  moist.       Neck: Supple with no signs of meningismus. Cardiovascular: Tachycardic. No murmurs, gallops, or rubs. 2+ symmetrical distal pulses are present . No JVD. No LE edema Respiratory: Tachypnea.bilateral air entry in all lung fields. No wheezes or crackles Gastrointestinal: Soft, non tender, and non distended with positive bowel sounds.  Genitourinary: No CVA tenderness. Musculoskeletal: Nontender with normal range of motion in all extremities. No cyanosis, or erythema of extremities. Neurologic:  Face is symmetric. Moving all extremities. No gross focal neurologic deficits . Skin: Skin is warm, dry.  No rash or ulcers Psychiatric: Mood and affect are normal    Labs on Admission: I have personally reviewed following labs and imaging studies  CBC: Recent Labs  Lab 08/09/21 1327  WBC 12.6*  HGB 12.4  HCT 37.3  MCV 87.8  PLT 281   Basic Metabolic Panel: Recent Labs  Lab 08/09/21 1327  NA 127*  K 2.7*  CL 94*  CO2 23  GLUCOSE 141*  BUN 16  CREATININE 1.18*  CALCIUM 9.0   GFR: Estimated Creatinine Clearance: 29.6 mL/min (A) (by C-G formula based on SCr of 1.18 mg/dL (H)). Liver Function Tests: No results for input(s): AST, ALT, ALKPHOS, BILITOT, PROT, ALBUMIN in the last 168 hours. No results for input(s): LIPASE, AMYLASE in the last 168 hours. No results for input(s): AMMONIA in the last 168 hours. Coagulation Profile: No results for input(s): INR, PROTIME in the last 168 hours. Cardiac Enzymes: No results for input(s): CKTOTAL, CKMB, CKMBINDEX, TROPONINI in the last 168 hours. BNP (last 3  results) No results for input(s): PROBNP in the last 8760 hours. HbA1C: No results for input(s): HGBA1C in the last 72 hours. CBG: No results for input(s): GLUCAP in the last 168 hours. Lipid Profile: No results for input(s): CHOL, HDL, LDLCALC, TRIG, CHOLHDL, LDLDIRECT in the last 72 hours. Thyroid Function Tests: No results for input(s): TSH, T4TOTAL, FREET4, T3FREE, THYROIDAB in the last 72 hours. Anemia Panel: No results for input(s): VITAMINB12, FOLATE, FERRITIN, TIBC, IRON, RETICCTPCT in the last 72 hours. Urine analysis:    Component Value Date/Time   COLORURINE YELLOW 12/29/2017 1410   APPEARANCEUR Sl Cloudy (A) 12/29/2017 1410   LABSPEC 1.025 12/29/2017 1410   PHURINE 5.5 12/29/2017 1410   GLUCOSEU NEGATIVE 12/29/2017 1410   HGBUR SMALL (A) 12/29/2017 1410   BILIRUBINUR NEGATIVE 12/29/2017 1410   BILIRUBINUR negative 12/09/2016 1007   KETONESUR NEGATIVE 12/29/2017 1410   PROTEINUR negative 12/09/2016 1007   PROTEINUR NEGATIVE 01/16/2009 1152   UROBILINOGEN 0.2 12/29/2017 1410   NITRITE NEGATIVE 12/29/2017 1410   LEUKOCYTESUR SMALL (A) 12/29/2017 1410    Radiological Exams on Admission: DG Chest 2 View  Result Date: 08/09/2021 CLINICAL DATA:  Shortness of breath and hypoxia.  COVID positive. EXAM: CHEST - 2 VIEW COMPARISON:  01/09/2018 and CT chest 03/18/2019. FINDINGS: Trachea is midline. Heart size normal. Bibasilar interstitial and airspace opacification, new from prior exam. No dense airspace consolidation or definite pleural fluid. Degenerative changes in left shoulder. IMPRESSION: Bibasilar mixed interstitial and airspace opacification is indicative of COVID pneumonia. Electronically Signed   By: Leanna Battles M.D.   On: 08/09/2021 13:52     Assessment/Plan Principal Problem:   Acute respiratory failure due to COVID-19 [facility]) Active Problems:   Essential hypertension   Anxiety   CKD (chronic kidney disease) stage 3, GFR 30-59 ml/min (HCC)   Depression,  recurrent (HCC)   Hypokalemia   Hyponatremia     Patient is an 87 year old  female admitted to the hospital for acute respiratory failure from COVID-19 pneumonia.    COVID-19 pneumonia with acute respiratory failure Patient's positive test was on 08/06/21 She is vaccinated and has received the booster shots for the COVID-19 virus She presented for evaluation of worsening shortness of breath and on room air at rest she had pulse oximetry of 86% and is currently on 2 L of oxygen to maintain pulse oximetry greater than 92%. Chest x-ray shows bilateral interstitial infiltrates consistent with COVID-pneumonia Place patient on remdesivir per protocol Place patient on IV Solu-Medrol Supportive care with bronchodilator therapy and antitussives.    Anxiety and depression Stable Continue sertraline    Hypokalemia/hyponatremia Secondary to HCTZ use Hold HCTZ for now Supplement potassium Check magnesium level Gentle IV fluid hydration    Hypertension with stage III chronic kidney disease Continue amlodipine Renal function is stable    History of coronary artery disease Continue aspirin and atorvastatin     DVT prophylaxis: Lovenox  Code Status: full code  Family Communication: Greater than 50% of time was spent discussing patient's condition and plan of care with her and her daughter at the bedside.  All questions and concerns have been addressed.  They verbalized understanding and agree with the plan. Disposition Plan: Back to previous home environment Consults called: none  Status:At the time of admission, it appears that the appropriate admission status for this patient is inpatient. This is judged to be reasonable and necessary to provide the required intensity of service to ensure the patient's safety given the presenting symptoms, physical exam findings, and initial radiographic and laboratory data in the context of their comorbid conditions. Patient requires inpatient  status due to high intensity of service, high risk for further deterioration and high frequency of surveillance required.     Collier Bullock MD [facility]     08/09/2021, 4:49 PM

## 2021-08-09 NOTE — ED Provider Notes (Signed)
[facility] Emergency Department Provider Note  ____________________________________________   I have reviewed the triage vital signs and the nursing notes.   HISTORY  Chief Complaint Shortness of Breath and Cough   History limited by: Not Limited   HPI Alicia Mccann is a 87 y.o. female who presents to the emergency department today from home because of concern for weakness, shortness of breath. The patient had home test positive for covid 3 days ago. She denies any previous history of lung disease. The patient states she is concerned about being home alone given the level of her weakness. She had her covid vaccine and boosters. Patient was given steroids and breathing treatment by EMS.   Records reviewed. Per medical record review patient has a history of CKD  Past Medical History:  Diagnosis Date   Allergic rhinitis    Anemia    Anxiety    Cellulitis    Cholelithiasis    Essential hypertension    History of stress test    a. 12/2008 Ex MV: EF 78%, no ischemia.   Insomnia    Osteoarthritis    Tachycardia    Valvular heart disease    a. 12/2008 Echo: EF 65%, mild MR;  b. 2/6 SEM RUSB - insignificant murmur, prob Ao Sclerosis.    Patient Active Problem List   Diagnosis Date Noted   Syncope 07/20/2021   Joint pain 04/07/2021   Unsteady gait 12/18/2019   Depression, recurrent ([place]) 07/31/2019   Memory changes 07/31/2019   Left knee pain 05/08/2019   Aortic atherosclerosis ([place]) 05/10/2018   Coronary artery calcification seen on CAT scan 05/10/2018   Decreased hearing 05/02/2018   History of total right knee replacement 01/21/2018   Chronic constipation    Noninfectious gastroenteritis    Lung nodule 01/17/2018   Anemia 01/14/2018   Hyperglycemia 01/09/2018   Colitis 01/09/2018   Right knee pain 11/28/2017   Light headedness 10/21/2017   Healthcare maintenance 06/25/2017   CKD (chronic kidney disease) stage 3, GFR 30-59 ml/min (HCC)  06/25/2017   Valvular heart disease 03/03/2017   Malaise and fatigue 01/05/2017   PAD (peripheral artery disease) ([place]) 05/19/2016   Diverticulitis 05/25/2015   Abdominal pain 05/24/2015   Allergic rhinitis 04/30/2015   Absolute anemia 04/30/2015   Anxiety 04/30/2015   Carpal tunnel syndrome 04/30/2015   Chronic LBP 04/30/2015   Edema extremities 04/30/2015   Cannot sleep 04/30/2015   Arthritis sicca 04/30/2015   Seasonal affective disorder ([place]) 04/30/2015   Heart murmur 06/27/2013   Essential hypertension 06/27/2013   Osteoarthritis 06/27/2013   Mixed hyperlipidemia 06/27/2013   History of repair of hip joint 07/08/2012   Cholelithiasis without obstruction 03/22/2007    Past Surgical History:  Procedure Laterality Date   BACK SURGERY     BUNIONECTOMY     CATARACT EXTRACTION W/PHACO Right 05/18/2019   Procedure: CATARACT EXTRACTION PHACO AND INTRAOCULAR LENS PLACEMENT (IOC) RIGHT MALYUGIN  01:06.5  20.6%  13.72;  Surgeon: Leandrew Koyanagi, MD;  Location: [place];  Service: Ophthalmology;  Laterality: Right;  please leave patient arrival 10   CATARACT EXTRACTION W/PHACO Left 06/08/2019   Procedure: CATARACT EXTRACTION PHACO AND INTRAOCULAR LENS PLACEMENT (IOC) LEFT  00:51.0  20.3%  10.37;  Surgeon: Leandrew Koyanagi, MD;  Location: [place];  Service: Ophthalmology;  Laterality: Left;  ARRIVAL 10:30 PLEASE LEAVE   CHOLECYSTECTOMY  03/29/2007   GALLBLADDER SURGERY     HIP SURGERY     bilateral  REPLACEMENT TOTAL KNEE Right    THUMB ARTHROSCOPY      Prior to Admission medications   Medication Sig Start Date End Date Taking? Authorizing Provider  acetaminophen (TYLENOL) 500 MG tablet Take 1,000 mg by mouth every 8 (eight) hours as needed for mild pain or moderate pain.    [provider]  ALPRAZolam Duanne Moron) 0.25 MG tablet Take 1 tablet (0.25 mg total) by mouth daily as needed for anxiety. 12/04/20   Einar Pheasant, MD  amLODipine  (NORVASC) 2.5 MG tablet Take 1 tablet (2.5 mg total) by mouth daily. 04/09/21   Minna Merritts, MD  aspirin 81 MG EC tablet Take 81 mg by mouth daily. Swallow whole.    [provider]  atorvastatin (LIPITOR) 10 MG tablet Take 1 tablet (10 mg total) by mouth daily. 04/09/21   Minna Merritts, MD  cholecalciferol (VITAMIN D) 1000 UNITS tablet Take 1,000 Units by mouth every other day.     [provider]  cyanocobalamin 100 MCG tablet Take 100 mcg by mouth daily.    [provider]  molnupiravir EUA (LAGEVRIO) 200 mg CAPS capsule Take 4 capsules (800 mg total) by mouth 2 (two) times daily for 5 days. 08/07/21 08/12/21  Flinchum, Kelby Aline, FNP  sertraline (ZOLOFT) 100 MG tablet Take 1 and 1/2 tablet q day. 07/19/21   Einar Pheasant, MD  triamterene-hydrochlorothiazide (MAXZIDE-25) 37.5-25 MG tablet Take 1 tablet by mouth daily. 04/09/21   Minna Merritts, MD    Allergies Clindamycin/lincomycin, Egg white (egg protein), Eggs or egg-derived products, Flagyl [metronidazole], Levaquin [levofloxacin], Mobic [meloxicam], Penicillins, and Sulfa antibiotics  Family History  Problem Relation Age of Onset   Heart attack Father    Stroke Father    Heart disease Father    CAD Father    Heart attack Son 51       MI   Hypertension Son    Hypertension Mother    Heart disease Mother    Stroke Mother    Breast cancer Neg Hx     Social History Social History   Tobacco Use   Smoking status: Never   Smokeless tobacco: Never  Vaping Use   Vaping Use: Never used  Substance Use Topics   Alcohol use: Yes    Comment: 2 oz QOD   Drug use: No    Review of Systems Constitutional: Positive for generalized weakness.  Eyes: No visual changes. ENT: No sore throat. Cardiovascular: Denies chest pain. Respiratory: Positive for shortness of breath. Gastrointestinal: No abdominal pain.  No nausea, no vomiting.  No diarrhea.   Genitourinary: Negative for  dysuria. Musculoskeletal: Negative for back pain. Skin: Negative for rash. Neurological: Negative for headaches, focal weakness or numbness.  ____________________________________________   PHYSICAL EXAM:  VITAL SIGNS: ED Triage Vitals  Enc Vitals Group     BP 08/09/21 1318 100/79     Pulse Rate 08/09/21 1318 (!) 113     Resp 08/09/21 1318 (!) 22     Temp 08/09/21 1318 (!) 100.4 F (38 C)     Temp Source 08/09/21 1318 Oral     SpO2 08/09/21 1318 97 %     Weight 08/09/21 1324 151 lb (68.5 kg)     Height 08/09/21 1324 5' (1.524 m)     Head Circumference --      Peak Flow --      Pain Score 08/09/21 1323 0   Constitutional: Alert and oriented.  Eyes: Conjunctivae are normal.  ENT  Head: Normocephalic and atraumatic.      Nose: No congestion/rhinnorhea.      Mouth/Throat: Mucous membranes are moist.      Neck: No stridor. Hematological/Lymphatic/Immunilogical: No cervical lymphadenopathy. Cardiovascular: Tachycardic, regular rhythm.  No murmurs, rubs, or gallops.  Respiratory: Normal respiratory effort without tachypnea nor retractions. Breath sounds are clear and equal bilaterally. No wheezes/rales/rhonchi. Gastrointestinal: Soft and non tender. No rebound. No guarding.  Genitourinary: Deferred Musculoskeletal: Normal range of motion in all extremities. No lower extremity edema. Neurologic:  Normal speech and language. No gross focal neurologic deficits are appreciated.  Skin:  Skin is warm, dry and intact. No rash noted. Psychiatric: Mood and affect are normal. Speech and behavior are normal. Patient exhibits appropriate insight and judgment.  ____________________________________________    LABS (pertinent positives/negatives)  Trop hs 29 CBC wbc 12.6, hgb 12.4, plt 281 BMP na 127, k 2.7, glu 141, cr 1.18  ____________________________________________   EKG  I, Phineas Semen, attending physician, personally viewed and interpreted this EKG  EKG Time:  1333 Rate: 111 Rhythm: sinus tachycardia Axis: normal Intervals: qtc 465 QRS: narrow ST changes: no st elevation Impression: abnormal ekg  ____________________________________________    RADIOLOGY  CXR Bibasilar mixed interstitial and airspace opacification  ____________________________________________   PROCEDURES  Procedures  ____________________________________________   INITIAL IMPRESSION / ASSESSMENT AND PLAN / ED COURSE  Pertinent labs & imaging results that were available during my care of the patient were reviewed by me and considered in my medical decision making (see chart for details).   Patient presented to the emergency department today because of concern for shortness of breath and weakness in setting of recent home diagnosis of COVID. Patient was hypoxic on room air here. CXR is consistent with covid. Will start remdisivir. Will plan on admission. Discussed plan with patient.   ____________________________________________   FINAL CLINICAL IMPRESSION(S) / ED DIAGNOSES  Final diagnoses:  COVID-19  Shortness of breath  Hypoxia     Note: This dictation was prepared with Dragon dictation. Any transcriptional errors that result from this process are unintentional     Phineas Semen, MD 08/09/21 1554

## 2021-08-10 ENCOUNTER — Encounter: Payer: Self-pay | Admitting: Internal Medicine

## 2021-08-10 DIAGNOSIS — R0902 Hypoxemia: Secondary | ICD-10-CM

## 2021-08-10 DIAGNOSIS — R0602 Shortness of breath: Secondary | ICD-10-CM

## 2021-08-10 DIAGNOSIS — J9600 Acute respiratory failure, unspecified whether with hypoxia or hypercapnia: Secondary | ICD-10-CM | POA: Diagnosis not present

## 2021-08-10 DIAGNOSIS — Z8616 Personal history of COVID-19: Secondary | ICD-10-CM | POA: Diagnosis present

## 2021-08-10 DIAGNOSIS — U071 COVID-19: Secondary | ICD-10-CM

## 2021-08-10 LAB — BASIC METABOLIC PANEL
Anion gap: 7 (ref 5–15)
BUN: 24 mg/dL — ABNORMAL HIGH (ref 8–23)
CO2: 23 mmol/L (ref 22–32)
Calcium: 9.3 mg/dL (ref 8.9–10.3)
Chloride: 103 mmol/L (ref 98–111)
Creatinine, Ser: 0.98 mg/dL (ref 0.44–1.00)
GFR, Estimated: 56 mL/min — ABNORMAL LOW (ref >60)
Glucose, Bld: 141 mg/dL — ABNORMAL HIGH (ref 70–99)
Potassium: 3.4 mmol/L — ABNORMAL LOW (ref 3.5–5.1)
Sodium: 133 mmol/L — ABNORMAL LOW (ref 135–145)

## 2021-08-10 LAB — C-REACTIVE PROTEIN: CRP: 14.6 mg/dL — ABNORMAL HIGH

## 2021-08-10 MED ORDER — ASCORBIC ACID 500 MG PO TABS: 500.0000 mg | ORAL_TABLET | Freq: Every day | ORAL | 0 refills | Status: AC

## 2021-08-10 MED ORDER — ENOXAPARIN SODIUM 40 MG/0.4ML IJ SOSY: 40.0000 mg | PREFILLED_SYRINGE | INTRAMUSCULAR | Status: DC

## 2021-08-10 MED ORDER — ZINC SULFATE 220 (50 ZN) MG PO CAPS
220.0000 mg | ORAL_CAPSULE | Freq: Every day | ORAL | 0 refills | Status: AC
Start: 2021-08-11 — End: 2021-09-10

## 2021-08-10 MED ORDER — POTASSIUM CHLORIDE CRYS ER 20 MEQ PO TBCR
40.0000 meq | EXTENDED_RELEASE_TABLET | Freq: Once | ORAL | Status: AC
  Administered 2021-08-10: 14:00:00 40 meq via ORAL
  Filled 2021-08-10: qty 2

## 2021-08-10 NOTE — Care Management CC44 (Signed)
Condition Code 44 Documentation Completed  Patient Details  Name: Alicia Mccann MRN: [id number] Date of Birth: 06/30/1933   Condition Code 44 given:  Yes Patient signature on Condition Code 44 notice:  Yes Documentation of 2 MD's agreement:  Yes Code 44 added to claim:  Yes  Patient had already left the building but RN CM spoke with her by phone with daughter present. Explained from and encouraged to contact her insurance provider for specifics. Patient was agreeable to RN CM to esign OBS note. Gabriel Cirri RN CM   Bing Quarry, RN 08/10/2021, 2:22 PM

## 2021-08-10 NOTE — Progress Notes (Signed)
Patient was given verbal and written discharge instructions, she acknowledge understanding and states she will comply. Patient was taken to car by wheelchair, no distress when leaving the floor.

## 2021-08-11 NOTE — Discharge Summary (Signed)
Physician Discharge Summary   [name] MRN: [id number] DOB: @DOB   Admit date:     08/09/2021  Discharge date: 08/10/2021  Discharge Physician: Max Sane   PCP: Einar Pheasant, MD   Recommendations at discharge: 1.  Follow-up with outpatient providers as requested 2. COVID isolation per CDC guidelines at home  Discharge Diagnoses Principal Problem:   COVID-19 Active Problems:   Essential hypertension   Anxiety   CKD (chronic kidney disease) stage 3, GFR 30-59 ml/min (HCC)   Depression, recurrent (HCC)   Hypokalemia   Hyponatremia   Shortness of breath   Hypoxia   COVID-19 virus infection  Hospital Course    87 y.o. female with medical history significant for hypertension and anxiety disorder who presents to the ER for evaluation of worsening shortness of breath over the last couple of days associated with a nonproductive cough and fever.  Patient was admitted for acute hypoxic respiratory failure due to COVID-19 pneumonia  Acute hypoxic respiratory failure due to COVID-19 pneumonia Patient tested positive for COVID on 08/06/2021 She is vaccinated and has received the booster shots for the COVID-19 virus She initially required 2 L oxygen but was weaned off to room air in 24 hours Chest x-ray shows bilateral interstitial infiltrates consistent with COVID-pneumonia She was treated with remdesivir and steroids per protocol and supportive care with bronchodilator therapy and antitussives.  After discussion with patient and her daughter patient was discharged home.  Patient is feeling much better.  Considering she is on room air I do not see any need to continue remdesivir and steroids.  Pain control - [employer] Controlled Substance Reporting System database was reviewed. and patient was instructed, not to drive, operate heavy machinery, perform activities at heights, swimming or participation in water activities or provide baby-sitting services while on  Pain, Sleep and Anxiety Medications; until their outpatient Physician has advised to do so again. Also recommended to not to take more than prescribed Pain, Sleep and Anxiety Medications.   Disposition: Home Diet recommendation: Cardiac diet  DISCHARGE MEDICATION: Allergies as of 08/10/2021       Reactions   Clindamycin/lincomycin    Egg White (egg Protein)    Eggs Or Egg-derived Products    Flagyl [metronidazole]    Levaquin [levofloxacin]    Mobic [meloxicam]    GI issues    Penicillins    Sulfa Antibiotics         Medication List     TAKE these medications    acetaminophen 500 MG tablet Commonly known as: TYLENOL Take 1,000 mg by mouth every 8 (eight) hours as needed for mild pain or moderate pain.   ALPRAZolam 0.25 MG tablet Commonly known as: XANAX Take 1 tablet (0.25 mg total) by mouth daily as needed for anxiety.   amLODipine 2.5 MG tablet Commonly known as: NORVASC Take 1 tablet (2.5 mg total) by mouth daily.   ascorbic acid 500 MG tablet Commonly known as: VITAMIN C Take 1 tablet (500 mg total) by mouth daily.   aspirin 81 MG EC tablet Take 81 mg by mouth daily. Swallow whole.   atorvastatin 10 MG tablet Commonly known as: LIPITOR Take 1 tablet (10 mg total) by mouth daily.   cholecalciferol 1000 units tablet Commonly known as: VITAMIN D Take 1,000 Units by mouth every other day.   cyanocobalamin 100 MCG tablet Take 100 mcg by mouth daily.   molnupiravir EUA 200 mg Caps capsule Commonly known as: LAGEVRIO Take 4 capsules (800 mg  total) by mouth 2 (two) times daily for 5 days.   sertraline 100 MG tablet Commonly known as: ZOLOFT Take 1 and 1/2 tablet q day. What changed:  how much to take how to take this when to take this additional instructions   triamterene-hydrochlorothiazide 37.5-25 MG tablet Commonly known as: MAXZIDE-25 Take 1 tablet by mouth daily.   zinc sulfate 220 (50 Zn) MG capsule Take 1 capsule (220 mg total) by mouth  daily.        Follow-up Information     Einar Pheasant, MD. Schedule an appointment as soon as possible for a visit in 3 day(s).   Specialty: Internal Medicine Why: [facility] Discharge F/UP Contact information: [id number] [place] [id number]                 Discharge Exam: Danley Danker Weights   08/09/21 1324  Weight: 68.5 kg    Constitutional: Alert and oriented x 3 . Not in any apparent distress HEENT:      Head: Normocephalic and atraumatic.         Eyes: PERLA, EOMI, Conjunctivae are normal. Sclera is non-icteric.       Mouth/Throat: Mucous membranes are moist.       Neck: Supple with no signs of meningismus. Cardiovascular: Tachycardic. No murmurs, gallops, or rubs. 2+ symmetrical distal pulses are present . No JVD. No LE edema Respiratory: Tachypnea.bilateral air entry in all lung fields. No wheezes or crackles Gastrointestinal: Soft, non tender, and non distended with positive bowel sounds.  Genitourinary: No CVA tenderness. Musculoskeletal: Nontender with normal range of motion in all extremities. No cyanosis, or erythema of extremities. Neurologic:  Face is symmetric. Moving all extremities. No gross focal neurologic deficits . Skin: Skin is warm, dry.  No rash or ulcers Psychiatric: Mood and affect are normal   Condition at discharge: good  The results of significant diagnostics from this hospitalization (including imaging, microbiology, ancillary and laboratory) are listed below for reference.   Imaging Studies: DG Chest 2 View  Result Date: 08/09/2021 CLINICAL DATA:  Shortness of breath and hypoxia.  COVID positive. EXAM: CHEST - 2 VIEW COMPARISON:  01/09/2018 and CT chest 03/18/2019. FINDINGS: Trachea is midline. Heart size normal. Bibasilar interstitial and airspace opacification, new from prior exam. No dense airspace consolidation or definite pleural fluid. Degenerative changes in left shoulder. IMPRESSION:  Bibasilar mixed interstitial and airspace opacification is indicative of COVID pneumonia. Electronically Signed   By: Lorin Picket M.D.   On: 08/09/2021 13:52    Microbiology: Results for orders placed or performed during the hospital encounter of 08/09/21  Resp Panel by RT-PCR (Flu A&B, Covid) Nasopharyngeal Swab     Status: None   Collection Time: 08/09/21  6:36 PM   Specimen: Nasopharyngeal Swab; Nasopharyngeal(NP) swabs in vial transport medium  Result Value Ref Range Status   SARS Coronavirus 2 by RT PCR NEGATIVE NEGATIVE Final    Comment: (NOTE) SARS-CoV-2 target nucleic acids are NOT DETECTED.  The SARS-CoV-2 RNA is generally detectable in upper respiratory specimens during the acute phase of infection. The lowest concentration of SARS-CoV-2 viral copies this assay can detect is 138 copies/mL. A negative result does not preclude SARS-Cov-2 infection and should not be used as the sole basis for treatment or other patient management decisions. A negative result may occur with  improper specimen collection/handling, submission of specimen other than nasopharyngeal swab, presence of viral mutation(s) within the areas targeted by this assay, and inadequate number of  viral copies(<138 copies/mL). A negative result must be combined with clinical observations, patient history, and epidemiological information. The expected result is Negative.  Fact Sheet for Patients:  BloggerCourse.com  Fact Sheet for Healthcare Providers:  SeriousBroker.it  This test is no t yet approved or cleared by the [place] FDA and  has been authorized for detection and/or diagnosis of SARS-CoV-2 by FDA under an Emergency Use Authorization (EUA). This EUA will remain  in effect (meaning this test can be used) for the duration of the COVID-19 declaration under Section 564(b)(1) of the Act, 21 U.S.C.section 360bbb-3(b)(1), unless the authorization is  terminated  or revoked sooner.       Influenza A by PCR NEGATIVE NEGATIVE Final   Influenza B by PCR NEGATIVE NEGATIVE Final    Comment: (NOTE) The Xpert Xpress SARS-CoV-2/FLU/RSV plus assay is intended as an aid in the diagnosis of influenza from Nasopharyngeal swab specimens and should not be used as a sole basis for treatment. Nasal washings and aspirates are unacceptable for Xpert Xpress SARS-CoV-2/FLU/RSV testing.  Fact Sheet for Patients: BloggerCourse.com  Fact Sheet for Healthcare Providers: SeriousBroker.it  This test is not yet approved or cleared by the [place] FDA and has been authorized for detection and/or diagnosis of SARS-CoV-2 by FDA under an Emergency Use Authorization (EUA). This EUA will remain in effect (meaning this test can be used) for the duration of the COVID-19 declaration under Section 564(b)(1) of the Act, 21 U.S.C. section 360bbb-3(b)(1), unless the authorization is terminated or revoked.  Performed at [facility], [address] [place], [place] [address]     Labs: CBC: Recent Labs  Lab 08/09/21 1327  WBC 12.6*  HGB 12.4  HCT 37.3  MCV 87.8  PLT 281   Basic Metabolic Panel: Recent Labs  Lab 08/09/21 1327 08/09/21 1836 08/10/21 1225  NA 127*  --  133*  K 2.7*  --  3.4*  CL 94*  --  103  CO2 23  --  23  GLUCOSE 141*  --  141*  BUN 16  --  24*  CREATININE 1.18*  --  0.98  CALCIUM 9.0  --  9.3  MG  --  1.9  --    Liver Function Tests: No results for input(s): AST, ALT, ALKPHOS, BILITOT, PROT, ALBUMIN in the last 168 hours. CBG: No results for input(s): GLUCAP in the last 168 hours.  Discharge time spent: greater than 30 minutes.  Signed:  Delfino Lovett MD.  [facility] 08/11/2021

## 2021-08-12 ENCOUNTER — Encounter: Payer: Self-pay | Admitting: Adult Health

## 2021-08-12 NOTE — Patient Instructions (Signed)
Upper Respiratory Infection, Adult An upper respiratory infection (URI) affects the nose, throat, and upper airways that lead to the lungs. The most common type of URI is often called the common cold. URIs usually get better on their own, without medical treatment. What are the causes? A URI is caused by a germ (virus). You may catch these germs by: Breathing in droplets from an infected person's cough or sneeze. Touching something that has the germ on it (is contaminated) and then touching your mouth, nose, or eyes. What increases the risk? You are more likely to get a URI if: You are very young or very old. You have close contact with others, such as at work, school, or a health care facility. You smoke. You have long-term (chronic) heart or lung disease. You have a weakened disease-fighting system (immune system). You have nasal allergies or asthma. You have a lot of stress. You have poor nutrition. What are the signs or symptoms? Runny or stuffy (congested) nose. Cough. Sneezing. Sore throat. Headache. Feeling tired (fatigue). Fever. Not wanting to eat as much as usual. Pain in your forehead, behind your eyes, and over your cheekbones (sinus pain). Muscle aches. Redness or irritation of the eyes. Pressure in the ears or face. How is this treated? URIs usually get better on their own within 7-10 days. Medicines cannot cure URIs, but your doctor may recommend certain medicines to help relieve symptoms, such as: Over-the-counter cold medicines. Medicines to reduce coughing (cough suppressants). Coughing is a type of defense against infection that helps to clear the nose, throat, windpipe, and lungs (respiratory system). Take these medicines only as told by your doctor. Medicines to lower your fever. Follow these instructions at home: Activity Rest as needed. If you have a fever, stay home from work or school until your fever is gone, or until your doctor says you may return to  work or school. You should stay home until you cannot spread the infection anymore (you are not contagious). Your doctor may have you wear a face mask so you have less risk of spreading the infection. Relieving symptoms Rinse your mouth often with salt water. To make salt water, dissolve -1 tsp (3-6 g) of salt in 1 cup (237 mL) of warm water. Use a cool-mist humidifier to add moisture to the air. This can help you breathe more easily. Eating and drinking  Drink enough fluid to keep your pee (urine) pale yellow. Eat soups and other clear broths. General instructions  Take over-the-counter and prescription medicines only as told by your doctor. Do not smoke or use any products that contain nicotine or tobacco. If you need help quitting, ask your doctor. Avoid being where people are smoking (avoid secondhand smoke). Stay up to date on all your shots (immunizations), and get the flu shot every year. Keep all follow-up visits. How to prevent the spread of infection to others  Wash your hands with soap and water for at least 20 seconds. If you cannot use soap and water, use hand sanitizer. Avoid touching your mouth, face, eyes, or nose. Cough or sneeze into a tissue or your sleeve or elbow. Do not cough or sneeze into your hand or into the air. Contact a doctor if: You are getting worse, not better. You have any of these: A fever or chills. Brown or red mucus in your nose. Yellow or brown fluid (discharge)coming from your nose. Pain in your face, especially when you bend forward. Swollen neck glands. Pain when you swallow.  White areas in the back of your throat. Get help right away if: You have shortness of breath that gets worse. You have very bad or constant: Headache. Ear pain. Pain in your forehead, behind your eyes, and over your cheekbones (sinus pain). Chest pain. You have long-lasting (chronic) lung disease along with any of these: Making high-pitched whistling sounds when  you breathe, most often when you breathe out (wheezing). Long-lasting cough (more than 14 days). Coughing up blood. A change in your usual mucus. You have a stiff neck. You have changes in your: Vision. Hearing. Thinking. Mood. These symptoms may be an emergency. Get help right away. Call 911. Do not wait to see if the symptoms will go away. Do not drive yourself to the hospital. Summary An upper respiratory infection (URI) is caused by a germ (virus). The most common type of URI is often called the common cold. URIs usually get better within 7-10 days. Take over-the-counter and prescription medicines only as told by your doctor. This information is not intended to replace advice given to you by your health care provider. Make sure you discuss any questions you have with your health care provider. Document Revised: 03/06/2021 Document Reviewed: 03/06/2021 Elsevier Patient Education  [place] Can Do to Manage Your COVID-19 Symptoms at Home If you have possible or confirmed COVID-19 Stay home except to get medical care. Monitor your symptoms carefully. If your symptoms get worse, call your healthcare provider immediately. Get rest and stay hydrated. If you have a medical appointment, call the healthcare provider ahead of time and tell them that you have or may have COVID-19. For medical emergencies, call 911 and notify the dispatch personnel that you have or may have COVID-19. Cover your cough and sneezes with a tissue or use the inside of your elbow. Wash your hands often with soap and water for at least 20 seconds or clean your hands with an alcohol-based hand sanitizer that contains at least 60% alcohol. As much as possible, stay in a specific room and away from other people in your home. Also, you should use a separate bathroom, if available. If you need to be around other people in or outside of the home, wear a mask. Avoid sharing personal items with other  people in your household, like dishes, towels, and bedding. Clean all surfaces that are touched often, like counters, tabletops, and doorknobs. Use household cleaning sprays or wipes according to the label instructions. [email or web address] 03/02/2020 This information is not intended to replace advice given to you by your health care provider. Make sure you discuss any questions you have with your health care provider. Document Revised: 04/26/2021 Document Reviewed: 04/26/2021 Elsevier Patient Education  2022 [place] Oral Capsules What is this medication? MOLNUPIRAVIR (mol nue pir a vir) treats COVID-19. It is an antiviral medication. It may decrease the risk of developing severe symptoms of COVID-19. It may also decrease the chance of going to the hospital. This medication is not approved by the FDA. The FDA has authorized emergency use of this medication during the COVID-19 pandemic. This medicine may be used for other purposes; ask your health care provider or pharmacist if you have questions. COMMON BRAND NAME(S): LAGEVRIO What should I tell my care team before I take this medication? They need to know if you have any of these conditions: Any allergies Any serious illness An unusual or allergic reaction to molnupiravir, other medications, foods, dyes, or preservatives Pregnant or trying to get  pregnant Breast-feeding How should I use this medication? Take this medication by mouth with water. Take it as directed on the prescription label at the same time every day. Do not cut, crush or chew this medication. Swallow the capsules whole. You can take it with or without food. If it upsets your stomach, take it with food. Take all of this medication unless your care team tells you to stop it early. Keep taking it even if you think you are better. Talk to your care team about the use of this medication in children. Special care may be needed. Overdosage: If you think you have  taken too much of this medicine contact a poison control center or emergency room at once. NOTE: This medicine is only for you. Do not share this medicine with others. What if I miss a dose? If you miss a dose, take it as soon as you can unless it is more than 10 hours late. If it is more than 10 hours late, skip the missed dose. Take the next dose at the normal time. Do not take extra or 2 doses at the same time to make up for the missed dose. What may interact with this medication? Interactions have not been studied. This list may not describe all possible interactions. Give your health care provider a list of all the medicines, herbs, non-prescription drugs, or dietary supplements you use. Also tell them if you smoke, drink alcohol, or use illegal drugs. Some items may interact with your medicine. What should I watch for while using this medication? Your condition will be monitored carefully while you are receiving this medication. Visit your care team for regular checkups. Tell your care team if your symptoms do not start to get better or if they get worse. Do not become pregnant while taking this medication. You may need a pregnancy test before starting this medication. Women must use a reliable form of birth control while taking this medication and for 4 days after stopping the medication. Women should inform their care team if they wish to become pregnant or think they might be pregnant. Men should not father a child while taking this medication and for 3 months after stopping it. There is potential for serious harm to an unborn child. Talk to your care team for more information. Do not breast-feed an infant while taking this medication and for 4 days after stopping the medication. What side effects may I notice from receiving this medication? Side effects that you should report to your care team as soon as possible: Allergic reactions--skin rash, itching, hives, swelling of the face, lips,  tongue, or throat Side effects that usually do not require medical attention (report these to your care team if they continue or are bothersome): Diarrhea Dizziness Nausea This list may not describe all possible side effects. Call your doctor for medical advice about side effects. You may report side effects to FDA at 1-800-FDA-1088. Where should I keep my medication? Keep out of the reach of children and pets. Store at room temperature between 20 and 25 degrees C (68 and 77 degrees F). Get rid of any unused medication after the expiration date. To get rid of medications that are no longer needed or have expired: Take the medication to a medication take-back program. Check with your pharmacy or law enforcement to find a location. If you cannot return the medication, check the label or package insert to see if the medication should be thrown out in the garbage or  flushed down the toilet. If you are not sure, ask your care team. If it is safe to put it in the trash, take the medication out of the container. Mix the medication with cat litter, dirt, coffee grounds, or other unwanted substance. Seal the mixture in a bag or container. Put it in the trash. NOTE: This sheet is a summary. It may not cover all possible information. If you have questions about this medicine, talk to your doctor, pharmacist, or health care provider.  2022 Elsevier/Gold Standard (2020-08-13 00:00:00)

## 2021-08-13 ENCOUNTER — Telehealth: Payer: Self-pay

## 2021-08-13 NOTE — Telephone Encounter (Signed)
Patient was discharged from the hospital 12/24. Noticed when she got home that she has little "dots" all over her from the hips to her ankles. Bilateral. Is not painful. No open lesions. They itch. Has not spread. No other acute symptoms. No new medications. She completed the antiviral medication for covid. Explained to pt that we cannot really tell what the rash is with out seeing it. Needs to be looked at to confirm etiology. Patient stated that she is not going to urgent care right now, she just got home. She is going to try using OTC hydrocortisone cream and see if that helps. She does need a HFU appt. Per discharge summary, within the next few days. See me about scheduling her.

## 2021-08-13 NOTE — Telephone Encounter (Signed)
Patient called in advised she had break out from medication, I advise to patient that she need to speak with access nurse, she req to speak with [place]. I spoke with Michiel Sites patient to [place]

## 2021-08-13 NOTE — Telephone Encounter (Signed)
See other note for documentation 

## 2021-08-13 NOTE — Telephone Encounter (Signed)
Agree if rash, needs to be evaluated.  Also, you said to see you about scheduling f/u appt

## 2021-08-14 NOTE — Telephone Encounter (Signed)
Noted  

## 2021-08-14 NOTE — Telephone Encounter (Signed)
Reviewed.  Agree with need for evaluation.  Per note, feeling better.  Let me know if needs anything.

## 2021-08-14 NOTE — Telephone Encounter (Signed)
FYI- Spoke with patient. Rash is still there. Not itching, not spreading. No change. No other acute symptoms. Advised that Dr Lorin Picket has recommended that she have the rash looked at. Patient declined. Offered to make her an appointment at urgent care for tomorrow so she doesn't have to sit and wait. Patient declined. Stated that it looks ok right now and since it is not changing and not bothering her she will wait to have it looked at. Continuing to feel better each day otherwise. She is going to get her hair done today. Will wear a mask while she is out. Appt scheduled with Dr Lorin Picket for 08/23/21. Advised that PCP is out of office until next week but let us know if she needs anything. Patient thanked me for the call.

## 2021-08-14 NOTE — Telephone Encounter (Signed)
Patient called in states still have rash and have additional questions need answered for [place]. I advised to patient see if we can schedule a follow up appt with another provider, will get in sooner but patient want to schedule appt with PCP but would like to speak with Bethann Berkshire first

## 2021-08-15 NOTE — Telephone Encounter (Signed)
Patient called and said she figured out why she has a rash. She was given medication that has sulfur in it and she is allergic to sulfur base medications. She states she does not need any medication.

## 2021-08-15 NOTE — Telephone Encounter (Signed)
Noted. Has upcoming appt with PCP 1/6

## 2021-08-21 NOTE — Telephone Encounter (Signed)
Pt calling in regards to question she has regarding covid. Pt would like a call back.

## 2021-08-22 ENCOUNTER — Ambulatory Visit: Payer: Medicare PPO

## 2021-08-22 ENCOUNTER — Telehealth: Payer: Self-pay | Admitting: Internal Medicine

## 2021-08-22 NOTE — Telephone Encounter (Signed)
Called patient. Phone kept ringing. Unable to leave message.

## 2021-08-22 NOTE — Telephone Encounter (Signed)
Pt called wanting to discuss her being in the hospital with covid and about her appt.

## 2021-08-23 ENCOUNTER — Ambulatory Visit: Payer: Medicare PPO | Admitting: Internal Medicine

## 2021-08-23 NOTE — Telephone Encounter (Signed)
See other message

## 2021-08-26 NOTE — Telephone Encounter (Signed)
Pt was calling in to let me know she had to reschedule appt last week because a close friends husband passed away and she had a funeral to attend. Appt has been rescheduled. She was also wondering if it was normal for her to still have some fatigue after having covid. She is continuing to get better just noticed that she is tired sometimes.

## 2021-09-02 NOTE — Progress Notes (Signed)
Cardiology Office Note  Date:  09/03/2021   ID:  Alicia Mccann, DOB Jan 16, 1933, MRN [id number]  PCP:  Alicia Pheasant, MD   Chief Complaint  Patient presents with   Pre-syncope     Patient c/o weakness and fatigue. Medications reviewed by the patient verbally.     HPI:  [name] is a 88 year-old woman history of  hypertension,  obesity,  osteoarthritis,  previously evaluated for murmur, aortic valve sclerosis without significant stenosis  Hypertension 12/2008 Ex MV: EF 78%, no ischemia. CT chest May 2019 coronary calcium , moderate in nature LAD Aortic plaque, mild to moderate/arch She presents for routine follow-up of her hypertension and aortic valve disease  On presentation today, discussed recent hospitalization tested positive for COVID on 08/06/2021 Admitted to the hospital with confusion, weakness Lab work in the hospital sodium 127, potassium 2.7 required 2 L oxygen but was weaned off to room air in 24 hours Chest x-ray shows bilateral interstitial infiltrates consistent with COVID-pneumonia She was treated with remdesivir and steroids per protocol and supportive care with bronchodilator therapy and antitussives. Had a "rash" from medication in hospital Insisted on going home the next day  Lives alone No energy since getting COVID Interested in getting another dog Has not been doing her regular exercise  Denies any chest pain, no shortness of breath, no leg swelling, no tachycardia  just some fatigue  A1C 6.1 Total chol 154, LDL 75  EKG personally reviewed by myself on todays visit Shows normal sinus rhythm rate 91  bpm no significant ST or T wave changes  Lost her mother, stroke  lost her son  likely secondary to acute MI.  Husband with severe heart disease, LVAD, died  TKR in early 2017-12-22 History of diverticulitis  CT chest May 2019 coronary calcium , moderate in nature LAD Aortic plaque, mild to moderate/arch  Echocardiogram May 2010 showed  normal LV systolic function, mild MR and TR  Echocardiogram stress test May 2010 was essentially normal. She exercised for 3 minutes achieved 4.6 METs, peak heart rate 151 beats per minute. No EKG changes concerning for ischemia. Echocardiogram showed no ischemia   Followup note from cardiology 12/22/2009 suggested she have fatigue from low-dose beta blocker. She was on lisinopril 20 mg at that time.   She does report having a family history. Mother was a smoker, had a stroke in her early 37s. Father had MI in his late 82s was not a smoker  PMH:   has a past medical history of Allergic rhinitis, Anemia, Anxiety, Cellulitis, Cholelithiasis, Essential hypertension, History of stress test, Insomnia, Osteoarthritis, Tachycardia, and Valvular heart disease.  PSH:    Past Surgical History:  Procedure Laterality Date   BACK SURGERY     BUNIONECTOMY     CATARACT EXTRACTION W/PHACO Right 05/18/2019   Procedure: CATARACT EXTRACTION PHACO AND INTRAOCULAR LENS PLACEMENT (IOC) RIGHT MALYUGIN  01:06.5  20.6%  13.72;  Surgeon: Leandrew Koyanagi, MD;  Location: [facility];  Service: Ophthalmology;  Laterality: Right;  please leave patient arrival 10   CATARACT EXTRACTION W/PHACO Left 06/08/2019   Procedure: CATARACT EXTRACTION PHACO AND INTRAOCULAR LENS PLACEMENT (IOC) LEFT  00:51.0  20.3%  10.37;  Surgeon: Leandrew Koyanagi, MD;  Location: [facility];  Service: Ophthalmology;  Laterality: Left;  ARRIVAL 10:30 PLEASE LEAVE   CHOLECYSTECTOMY  03/29/2007   GALLBLADDER SURGERY     HIP SURGERY     bilateral    REPLACEMENT TOTAL KNEE Right  THUMB ARTHROSCOPY      Current Outpatient Medications  Medication Sig Dispense Refill   acetaminophen (TYLENOL) 500 MG tablet Take 1,000 mg by mouth every 8 (eight) hours as needed for mild pain or moderate pain.     ALPRAZolam (XANAX) 0.25 MG tablet Take 1 tablet (0.25 mg total) by mouth daily as needed for anxiety. 30 tablet 0   amLODipine  (NORVASC) 2.5 MG tablet Take 1 tablet (2.5 mg total) by mouth daily. 90 tablet 3   ascorbic acid (VITAMIN C) 500 MG tablet Take 1 tablet (500 mg total) by mouth daily. 30 tablet 0   aspirin 81 MG EC tablet Take 81 mg by mouth daily. Swallow whole.     atorvastatin (LIPITOR) 10 MG tablet Take 1 tablet (10 mg total) by mouth daily. 90 tablet 3   cholecalciferol (VITAMIN D) 1000 UNITS tablet Take 1,000 Units by mouth every other day.      cyanocobalamin 100 MCG tablet Take 100 mcg by mouth daily.     sertraline (ZOLOFT) 100 MG tablet Take 1 and 1/2 tablet q day. (Patient taking differently: Take 100 mg by mouth daily.) 135 tablet 1   triamterene-hydrochlorothiazide (MAXZIDE-25) 37.5-25 MG tablet Take 1 tablet by mouth daily. 90 tablet 3   zinc sulfate 220 (50 Zn) MG capsule Take 1 capsule (220 mg total) by mouth daily. 30 capsule 0   No current facility-administered medications for this visit.    Allergies:   Clindamycin/lincomycin, Egg white (egg protein), Eggs or egg-derived products, Flagyl [metronidazole], Levaquin [levofloxacin], Mobic [meloxicam], Penicillins, and Sulfa antibiotics   Social History:  The patient  reports that she has never smoked. She has never used smokeless tobacco. She reports current alcohol use. She reports that she does not use drugs.   Family History:   family history includes CAD in her father; Heart attack in her father; Heart attack (age of onset: 30) in her son; Heart disease in her father and mother; Hypertension in her mother and son; Stroke in her father and mother.   Review of Systems: Review of Systems  Constitutional: Negative.   Respiratory: Negative.    Cardiovascular: Negative.   Gastrointestinal: Negative.   Musculoskeletal: Negative.   Neurological: Negative.   Psychiatric/Behavioral: Negative.    All other systems reviewed and are negative.  PHYSICAL EXAM: VS:  BP 138/60 (BP Location: Left Arm, Patient Position: Sitting, Cuff Size: Normal)     Pulse 91    Ht 5' 1.5" (1.562 m)    Wt 150 lb 6 oz (68.2 kg)    SpO2 97%    BMI 27.95 kg/m  , BMI Body mass index is 27.95 kg/m. Constitutional:  oriented to person, place, and time. No distress.  HENT:  Head: Grossly normal Eyes:  no discharge. No scleral icterus.  Neck: No JVD, no carotid bruits  Cardiovascular: Regular rate and rhythm, no murmurs appreciated Pulmonary/Chest: Clear to auscultation bilaterally, no wheezes or rails Abdominal: Soft.  no distension.  no tenderness.  Musculoskeletal: Normal range of motion Neurological:  normal muscle tone. Coordination normal. No atrophy Skin: Skin warm and dry Psychiatric: normal affect, pleasant  Recent Labs: 04/02/2021: TSH 1.57 07/19/2021: ALT 14 08/09/2021: Hemoglobin 12.4; Magnesium 1.9; Platelets 281 08/10/2021: BUN 24; Creatinine, Ser 0.98; Potassium 3.4; Sodium 133    Lipid Panel Wt Readings from Last 3 Encounters:  09/03/21 150 lb 6 oz (68.2 kg)  08/09/21 151 lb (68.5 kg)  08/07/21 151 lb (68.5 kg)     ASSESSMENT AND  PLAN:  COVID pneumonia Recent short hospitalization, noted to have hyponatremia, hypokalemia Improved with IV fluids, also treated with steroids, remdesivir Has made relatively good recovery, still with some fatigue No further cardiac work-up needed  Valvular heart disease- Minimal aortic valve disease, likely sclerosis without stenosis Minimal murmur on exam   Coronary calcification on CT scan Currently with no symptoms of angina. No further workup at this time. Continue current medication regimen.  Mixed hyperlipidemia - Plan: EKG 12-Lead Continue Lipitor  Essential hypertension - Plan: EKG 12-Lead Blood pressure is well controlled on today's visit. No changes made to the medications.  Edema extremities - Plan: EKG 12-Lead No significant edema  Aortic atherosclerosis Previously noted on CT scan images No further work-up at this time   Total encounter time more than 25 minutes  Greater  than 50% was spent in counseling and coordination of care with the patient   Orders Placed This Encounter  Procedures   EKG 12-Lead      Signed, Esmond Plants, M.D., Ph.D. 09/03/2021  [place], [place]

## 2021-09-03 ENCOUNTER — Encounter: Payer: Self-pay | Admitting: Cardiovascular Disease

## 2021-09-03 ENCOUNTER — Other Ambulatory Visit: Payer: Self-pay

## 2021-09-03 ENCOUNTER — Ambulatory Visit: Payer: Medicare PPO | Admitting: Cardiovascular Disease

## 2021-09-03 VITALS — BP 138/60 | HR 91 | Ht 61.5 in | Wt 150.4 lb

## 2021-09-03 DIAGNOSIS — I2510 Atherosclerotic heart disease of native coronary artery without angina pectoris: Secondary | ICD-10-CM

## 2021-09-03 DIAGNOSIS — I38 Endocarditis, valve unspecified: Secondary | ICD-10-CM | POA: Diagnosis not present

## 2021-09-03 DIAGNOSIS — E782 Mixed hyperlipidemia: Secondary | ICD-10-CM | POA: Diagnosis not present

## 2021-09-03 DIAGNOSIS — I700 Atherosclerosis of aorta: Secondary | ICD-10-CM

## 2021-09-03 DIAGNOSIS — I739 Peripheral vascular disease, unspecified: Secondary | ICD-10-CM

## 2021-09-03 DIAGNOSIS — I10 Essential (primary) hypertension: Secondary | ICD-10-CM

## 2021-09-03 NOTE — Patient Instructions (Signed)
Medication Instructions:  No changes  If you need a refill on your cardiac medications before your next appointment, please call your pharmacy.   Lab work: No new labs needed  Testing/Procedures: No new testing needed  Follow-Up: At [facility], you and your health needs are our priority.  As part of our continuing mission to provide you with exceptional heart care, we have created designated Provider Care Teams.  These Care Teams include your primary Cardiologist (physician) and Advanced Practice Providers (APPs -  Physician Assistants and Nurse Practitioners) who all work together to provide you with the care you need, when you need it.  You will need a follow up appointment in 12 months  Providers on your designated Care Team:   Christopher Berge, NP Ryan Dunn, PA-C Cadence Furth, PA-C  COVID-19 Vaccine Information can be found at: [email or web address] For questions related to vaccine distribution or appointments, please email [email or web address] or call [phone].   

## 2021-09-13 ENCOUNTER — Ambulatory Visit: Payer: Medicare PPO | Admitting: Internal Medicine

## 2021-09-17 ENCOUNTER — Ambulatory Visit (INDEPENDENT_AMBULATORY_CARE_PROVIDER_SITE_OTHER): Payer: Medicare PPO

## 2021-09-17 ENCOUNTER — Encounter: Payer: Self-pay | Admitting: Internal Medicine

## 2021-09-17 ENCOUNTER — Other Ambulatory Visit: Payer: Self-pay

## 2021-09-17 ENCOUNTER — Ambulatory Visit: Payer: Medicare PPO | Admitting: Internal Medicine

## 2021-09-17 VITALS — BP 146/78 | HR 80 | Temp 98.4°F | Resp 16 | Ht 62.0 in | Wt 153.1 lb

## 2021-09-17 DIAGNOSIS — Z8616 Personal history of COVID-19: Secondary | ICD-10-CM

## 2021-09-17 DIAGNOSIS — R5381 Other malaise: Secondary | ICD-10-CM | POA: Diagnosis not present

## 2021-09-17 DIAGNOSIS — I700 Atherosclerosis of aorta: Secondary | ICD-10-CM

## 2021-09-17 DIAGNOSIS — F339 Major depressive disorder, recurrent, unspecified: Secondary | ICD-10-CM | POA: Diagnosis not present

## 2021-09-17 DIAGNOSIS — U071 COVID-19: Secondary | ICD-10-CM | POA: Diagnosis not present

## 2021-09-17 DIAGNOSIS — I10 Essential (primary) hypertension: Secondary | ICD-10-CM | POA: Diagnosis not present

## 2021-09-17 DIAGNOSIS — N183 Chronic kidney disease, stage 3 unspecified: Secondary | ICD-10-CM | POA: Diagnosis not present

## 2021-09-17 DIAGNOSIS — R739 Hyperglycemia, unspecified: Secondary | ICD-10-CM

## 2021-09-17 DIAGNOSIS — E871 Hypo-osmolality and hyponatremia: Secondary | ICD-10-CM

## 2021-09-17 DIAGNOSIS — D72829 Elevated white blood cell count, unspecified: Secondary | ICD-10-CM | POA: Diagnosis not present

## 2021-09-17 DIAGNOSIS — R5383 Other fatigue: Secondary | ICD-10-CM

## 2021-09-17 DIAGNOSIS — I739 Peripheral vascular disease, unspecified: Secondary | ICD-10-CM | POA: Diagnosis not present

## 2021-09-17 DIAGNOSIS — R9389 Abnormal findings on diagnostic imaging of other specified body structures: Secondary | ICD-10-CM | POA: Diagnosis not present

## 2021-09-17 LAB — CBC WITH DIFFERENTIAL/PLATELET
Basophils Absolute: 0.1 10*3/uL (ref 0.0–0.1)
Basophils Relative: 1.1 % (ref 0.0–3.0)
Eosinophils Absolute: 0.2 10*3/uL (ref 0.0–0.7)
Eosinophils Relative: 2.2 % (ref 0.0–5.0)
HCT: 39.3 % (ref 36.0–46.0)
Hemoglobin: 12.4 g/dL (ref 12.0–15.0)
Lymphocytes Relative: 18.7 % (ref 12.0–46.0)
Lymphs Abs: 2.1 10*3/uL (ref 0.7–4.0)
MCHC: 31.4 g/dL (ref 30.0–36.0)
MCV: 88 fl (ref 78.0–100.0)
Monocytes Absolute: 0.7 10*3/uL (ref 0.1–1.0)
Monocytes Relative: 6 % (ref 3.0–12.0)
Neutro Abs: 8 10*3/uL — ABNORMAL HIGH (ref 1.4–7.7)
Neutrophils Relative %: 72 % (ref 43.0–77.0)
Platelets: 287 10*3/uL (ref 150.0–400.0)
RBC: 4.47 Mil/uL (ref 3.87–5.11)
RDW: 14.1 % (ref 11.5–15.5)
WBC: 11.1 10*3/uL — ABNORMAL HIGH (ref 4.0–10.5)

## 2021-09-17 LAB — BASIC METABOLIC PANEL
BUN: 27 mg/dL — ABNORMAL HIGH (ref 6–23)
CO2: 28 mEq/L (ref 19–32)
Calcium: 10 mg/dL (ref 8.4–10.5)
Chloride: 102 mEq/L (ref 96–112)
Creatinine, Ser: 1.06 mg/dL (ref 0.40–1.20)
GFR: 47.63 mL/min — ABNORMAL LOW (ref >60.00)
Glucose, Bld: 82 mg/dL (ref 70–99)
Potassium: 4.1 mEq/L (ref 3.5–5.1)
Sodium: 139 mEq/L (ref 135–145)

## 2021-09-17 NOTE — Progress Notes (Signed)
Patient ID: Alicia Mccann, female   DOB: 02-Apr-1933, 88 y.o.   MRN: [id number]   Subjective:    Patient ID: Alicia Mccann, female    DOB: 1932/10/29, 88 y.o.   MRN: [id number]  This visit occurred during the SARS-CoV-2 public health emergency.  Safety protocols were in place, including screening questions prior to the visit, additional usage of staff PPE, and extensive cleaning of exam room while observing appropriate contact time as indicated for disinfecting solutions.   Patient here for a scheduled follow up.    HPI Here to follow up regarding hypertension, stress and recently diagnosed with covid 08/06/21.  Admitted 08/09/21 - confusion, weakness.  Labs sodium 127 and potassium 2.7.  initially required oxygen.  Weaned to room air.  Cxr bilateral interstitial infiltrates - c/w covid pneumonia.  Treated with remdesivir and steroids.  Breathing better.  No increased cough and congestion now.  Still with some fatigue.  Is better than previous.  Saw Dr Rockey Situ 09/03/21.  On further cardiac w/up felt warranted.  No chest pain reported.  Eating.  No vomiting or abdominal pain reported.  Persistent left shoulder pain.  Due to f/u with Dr Jefm Bryant next week.  Still with increased stress.  Discussed consideration of another dog, etc.     Past Medical History:  Diagnosis Date   Allergic rhinitis    Anemia    Anxiety    Cellulitis    Cholelithiasis    Essential hypertension    History of stress test    a. 12/2008 Ex MV: EF 78%, no ischemia.   Insomnia    Osteoarthritis    Tachycardia    Valvular heart disease    a. 12/2008 Echo: EF 65%, mild MR;  b. 2/6 SEM RUSB - insignificant murmur, prob Ao Sclerosis.   Past Surgical History:  Procedure Laterality Date   BACK SURGERY     BUNIONECTOMY     CATARACT EXTRACTION W/PHACO Right 05/18/2019   Procedure: CATARACT EXTRACTION PHACO AND INTRAOCULAR LENS PLACEMENT (IOC) RIGHT MALYUGIN  01:06.5  20.6%  13.72;  Surgeon: Leandrew Koyanagi, MD;   Location: [facility];  Service: Ophthalmology;  Laterality: Right;  please leave patient arrival 10   CATARACT EXTRACTION W/PHACO Left 06/08/2019   Procedure: CATARACT EXTRACTION PHACO AND INTRAOCULAR LENS PLACEMENT (IOC) LEFT  00:51.0  20.3%  10.37;  Surgeon: Leandrew Koyanagi, MD;  Location: [facility];  Service: Ophthalmology;  Laterality: Left;  ARRIVAL 10:30 PLEASE LEAVE   CHOLECYSTECTOMY  03/29/2007   GALLBLADDER SURGERY     HIP SURGERY     bilateral    REPLACEMENT TOTAL KNEE Right    THUMB ARTHROSCOPY     Family History  Problem Relation Age of Onset   Heart attack Father    Stroke Father    Heart disease Father    CAD Father    Heart attack Son 66       MI   Hypertension Son    Hypertension Mother    Heart disease Mother    Stroke Mother    Breast cancer Neg Hx    Social History   Socioeconomic History   Marital status: Widowed    Spouse name: Not on file   Number of children: Not on file   Years of education: Not on file   Highest education level: Not on file  Occupational History   Not on file  Tobacco Use   Smoking status: Never   Smokeless tobacco: Never  Vaping Use  Vaping Use: Never used  Substance and Sexual Activity   Alcohol use: Yes    Comment: 2 oz QOD   Drug use: No   Sexual activity: Not Currently  Other Topics Concern   Not on file  Social History Narrative   Not on file   Social Determinants of Health   Financial Resource Strain: Not on file  Food Insecurity: Not on file  Transportation Needs: Not on file  Physical Activity: Not on file  Stress: Not on file  Social Connections: Not on file     Review of Systems  Constitutional:  Positive for fatigue. Negative for appetite change and unexpected weight change.  HENT:  Negative for congestion and sinus pressure.   Respiratory:  Negative for cough, chest tightness and shortness of breath.   Cardiovascular:  Negative for chest pain, palpitations and leg swelling.   Gastrointestinal:  Negative for abdominal pain, diarrhea, nausea and vomiting.  Genitourinary:  Negative for difficulty urinating and dysuria.  Musculoskeletal:  Negative for myalgias.       Left shoulder pain as outlined.   Skin:  Negative for color change and rash.  Neurological:  Negative for dizziness, light-headedness and headaches.  Psychiatric/Behavioral:  Negative for agitation and dysphoric mood.       Objective:     BP (!) 146/78 (BP Location: Left Arm, Patient Position: Sitting, Cuff Size: Normal)    Pulse 80    Temp 98.4 F (36.9 C) (Oral)    Resp 16    Ht '5\' 2"'  (1.575 m)    Wt 153 lb 2 oz (69.5 kg)    SpO2 98%    BMI 28.01 kg/m  Wt Readings from Last 3 Encounters:  09/17/21 153 lb 2 oz (69.5 kg)  09/03/21 150 lb 6 oz (68.2 kg)  08/09/21 151 lb (68.5 kg)    Physical Exam Vitals reviewed.  Constitutional:      General: She is not in acute distress.    Appearance: Normal appearance.  HENT:     Head: Normocephalic and atraumatic.     Right Ear: External ear normal.     Left Ear: External ear normal.  Eyes:     General: No scleral icterus.       Right eye: No discharge.        Left eye: No discharge.     Conjunctiva/sclera: Conjunctivae normal.  Neck:     Thyroid: No thyromegaly.  Cardiovascular:     Rate and Rhythm: Normal rate and regular rhythm.  Pulmonary:     Effort: No respiratory distress.     Breath sounds: Normal breath sounds. No wheezing.  Abdominal:     General: Bowel sounds are normal.     Palpations: Abdomen is soft.     Tenderness: There is no abdominal tenderness.  Musculoskeletal:        General: No swelling or tenderness.     Cervical back: Neck supple. No tenderness.  Lymphadenopathy:     Cervical: No cervical adenopathy.  Skin:    Findings: No erythema or rash.  Neurological:     Mental Status: She is alert.  Psychiatric:        Mood and Affect: Mood normal.        Behavior: Behavior normal.     Outpatient Encounter  Medications as of 09/17/2021  Medication Sig   acetaminophen (TYLENOL) 500 MG tablet Take 1,000 mg by mouth every 8 (eight) hours as needed for mild pain or moderate pain.  ALPRAZolam (XANAX) 0.25 MG tablet Take 1 tablet (0.25 mg total) by mouth daily as needed for anxiety.   amLODipine (NORVASC) 2.5 MG tablet Take 1 tablet (2.5 mg total) by mouth daily.   aspirin 81 MG EC tablet Take 81 mg by mouth daily. Swallow whole.   atorvastatin (LIPITOR) 10 MG tablet Take 1 tablet (10 mg total) by mouth daily.   cholecalciferol (VITAMIN D) 1000 UNITS tablet Take 1,000 Units by mouth every other day.    cyanocobalamin 100 MCG tablet Take 100 mcg by mouth daily.   sertraline (ZOLOFT) 100 MG tablet Take 1 and 1/2 tablet q day. (Patient taking differently: Take 100 mg by mouth daily.)   triamterene-hydrochlorothiazide (MAXZIDE-25) 37.5-25 MG tablet Take 1 tablet by mouth daily.   No facility-administered encounter medications on file as of 09/17/2021.     Lab Results  Component Value Date   WBC 11.1 (H) 09/17/2021   HGB 12.4 09/17/2021   HCT 39.3 09/17/2021   PLT 287.0 09/17/2021   GLUCOSE 82 09/17/2021   CHOL 154 07/19/2021   TRIG 100.0 07/19/2021   HDL 59.70 07/19/2021   LDLDIRECT 66.0 09/07/2018   LDLCALC 75 07/19/2021   ALT 14 07/19/2021   AST 14 07/19/2021   NA 139 09/17/2021   K 4.1 09/17/2021   CL 102 09/17/2021   CREATININE 1.06 09/17/2021   BUN 27 (H) 09/17/2021   CO2 28 09/17/2021   TSH 1.57 04/02/2021   INR 0.9 01/16/2009   HGBA1C 6.1 07/19/2021    DG Chest 2 View  Result Date: 08/09/2021 CLINICAL DATA:  Shortness of breath and hypoxia.  COVID positive. EXAM: CHEST - 2 VIEW COMPARISON:  01/09/2018 and CT chest 03/18/2019. FINDINGS: Trachea is midline. Heart size normal. Bibasilar interstitial and airspace opacification, new from prior exam. No dense airspace consolidation or definite pleural fluid. Degenerative changes in left shoulder. IMPRESSION: Bibasilar mixed interstitial  and airspace opacification is indicative of COVID pneumonia. Electronically Signed   By: Lorin Picket M.D.   On: 08/09/2021 13:52       Assessment & Plan:   Problem List Items Addressed This Visit     Aortic atherosclerosis ([place])    Continue atorvastatin.       CKD (chronic kidney disease) stage 3, GFR 30-59 ml/min (HCC)    Continue to avoid antiinflammatories.  Stay hydrated.  Follow metabolic panel.       Depression, recurrent ([place])    Discussed.  On zoloft.  Continue.  Follow.        Essential hypertension    Continue triam/hctz and amlodipine.  Follow pressures.  Follow metabolic panel.       History of COVID-19 - Primary    Recent covid as outlined.  Off oxygen.  Breathing better.  Feeling better.  Persistent fatigue as outlined.  Check cxr to confirm clear lungs.  Also recheck labs as outlined.        Relevant Orders   DG Chest 2 View (Completed)   Hyperglycemia    Low carb diet and exercise.  Follow met b and a1c.        Hyponatremia    Recheck sodium to confirm wnl.       Relevant Orders   Basic metabolic panel (Completed)   Leukocytosis    Recheck cbc to confirm wnl.       Relevant Orders   CBC with Differential/Platelet (Completed)   CBC w/Diff   Malaise and fatigue    Recent covid.  Is feeling  better from recent infection, but still with persistent fatigue.  Saw cardiology.  Felt no further cardiac w/up warranted.  Check labs to confirm sodium and potassium back to normal.  Check cxr - to confirm lungs clear - from previous infection.       PAD (peripheral artery disease) ([place])    Continue crestor and aspirin.          Einar Pheasant, MD

## 2021-09-25 DIAGNOSIS — M25512 Pain in left shoulder: Secondary | ICD-10-CM | POA: Diagnosis not present

## 2021-09-25 DIAGNOSIS — M25562 Pain in left knee: Secondary | ICD-10-CM | POA: Diagnosis not present

## 2021-09-25 DIAGNOSIS — G8929 Other chronic pain: Secondary | ICD-10-CM | POA: Diagnosis not present

## 2021-09-25 DIAGNOSIS — M159 Polyosteoarthritis, unspecified: Secondary | ICD-10-CM | POA: Diagnosis not present

## 2021-09-29 ENCOUNTER — Encounter: Payer: Self-pay | Admitting: Internal Medicine

## 2021-09-29 NOTE — Assessment & Plan Note (Signed)
Recheck sodium to confirm wnl.  

## 2021-09-29 NOTE — Assessment & Plan Note (Signed)
Continue triam/hctz and amlodipine.  Follow pressures.  Follow metabolic panel.  

## 2021-09-29 NOTE — Assessment & Plan Note (Signed)
Continue to avoid antiinflammatories.  Stay hydrated.  Follow metabolic panel.  

## 2021-09-29 NOTE — Assessment & Plan Note (Signed)
Continue atorvastatin

## 2021-09-29 NOTE — Assessment & Plan Note (Signed)
Recheck cbc to confirm wnl.  

## 2021-09-29 NOTE — Assessment & Plan Note (Signed)
Recent covid as outlined.  Off oxygen.  Breathing better.  Feeling better.  Persistent fatigue as outlined.  Check cxr to confirm clear lungs.  Also recheck labs as outlined.

## 2021-09-29 NOTE — Assessment & Plan Note (Signed)
Low carb diet and exercise.  Follow met b and a1c.

## 2021-09-29 NOTE — Assessment & Plan Note (Signed)
Continue crestor and aspirin.  

## 2021-09-29 NOTE — Assessment & Plan Note (Signed)
Recent covid.  Is feeling better from recent infection, but still with persistent fatigue.  Saw cardiology.  Felt no further cardiac w/up warranted.  Check labs to confirm sodium and potassium back to normal.  Check cxr - to confirm lungs clear - from previous infection.

## 2021-09-29 NOTE — Assessment & Plan Note (Signed)
Discussed.  On zoloft.  Continue.  Follow.   

## 2021-10-04 ENCOUNTER — Ambulatory Visit (INDEPENDENT_AMBULATORY_CARE_PROVIDER_SITE_OTHER): Payer: Medicare PPO

## 2021-10-04 VITALS — Ht 62.0 in | Wt 153.0 lb

## 2021-10-04 DIAGNOSIS — Z1231 Encounter for screening mammogram for malignant neoplasm of breast: Secondary | ICD-10-CM

## 2021-10-04 DIAGNOSIS — Z0000 Encounter for general adult medical examination without abnormal findings: Secondary | ICD-10-CM | POA: Diagnosis not present

## 2021-10-04 NOTE — Patient Instructions (Addendum)
Alicia Mccann , Thank you for taking time to come for your Medicare Wellness Visit. I appreciate your ongoing commitment to your health goals. Please review the following plan we discussed and let me know if I can assist you in the future.   These are the goals we discussed:  Goals       Patient Stated     DIET - INCREASE WATER INTAKE (pt-stated)      Stay hydrated      Weight (lb) < 155 lb (70.3 kg) (pt-stated)      Snack smart  Portion control Walk more for exercise      Other     Exercise 150 minutes per week (moderate activity)        This is a list of the screening recommended for you and due dates:  Health Maintenance  Topic Date Due   COVID-19 Vaccine (4 - Booster for Pfizer series) 10/05/2021*   Zoster (Shingles) Vaccine (1 of 2) 12/17/2021*   Tetanus Vaccine  10/04/2022*   Pneumonia Vaccine  Completed   Flu Shot  Completed   DEXA scan (bone density measurement)  Completed   HPV Vaccine  Aged Out  *Topic was postponed. The date shown is not the original due date.   Advanced directives: End of life planning; Advance aging; Advanced directives discussed.  Copy of current HCPOA/Living Will requested.    Next appointment: Follow up in one year for your annual wellness visit    Preventive Care 65 Years and Older, Female Preventive care refers to lifestyle choices and visits with your health care provider that can promote health and wellness. What does preventive care include? A yearly physical exam. This is also called an annual well check. Dental exams once or twice a year. Routine eye exams. Ask your health care provider how often you should have your eyes checked. Personal lifestyle choices, including: Daily care of your teeth and gums. Regular physical activity. Eating a healthy diet. Avoiding tobacco and drug use. Limiting alcohol use. Practicing safe sex. Taking low-dose aspirin every day. Taking vitamin and mineral supplements as recommended by your health  care provider. What happens during an annual well check? The services and screenings done by your health care provider during your annual well check will depend on your age, overall health, lifestyle risk factors, and family history of disease. Counseling  Your health care provider may ask you questions about your: Alcohol use. Tobacco use. Drug use. Emotional well-being. Home and relationship well-being. Sexual activity. Eating habits. History of falls. Memory and ability to understand (cognition). Work and work Astronomer. Reproductive health. Screening  You may have the following tests or measurements: Height, weight, and BMI. Blood pressure. Lipid and cholesterol levels. These may be checked every 5 years, or more frequently if you are over 72 years old. Skin check. Lung cancer screening. You may have this screening every year starting at age 84 if you have a 30-pack-year history of smoking and currently smoke or have quit within the past 15 years. Fecal occult blood test (FOBT) of the stool. You may have this test every year starting at age 72. Flexible sigmoidoscopy or colonoscopy. You may have a sigmoidoscopy every 5 years or a colonoscopy every 10 years starting at age 70. Hepatitis C blood test. Hepatitis B blood test. Sexually transmitted disease (STD) testing. Diabetes screening. This is done by checking your blood sugar (glucose) after you have not eaten for a while (fasting). You may have this done every 1-3  years. Bone density scan. This is done to screen for osteoporosis. You may have this done starting at age 37. Mammogram. This may be done every 1-2 years. Talk to your health care provider about how often you should have regular mammograms. Talk with your health care provider about your test results, treatment options, and if necessary, the need for more tests. Vaccines  Your health care provider may recommend certain vaccines, such as: Influenza vaccine. This is  recommended every year. Tetanus, diphtheria, and acellular pertussis (Tdap, Td) vaccine. You may need a Td booster every 10 years. Zoster vaccine. You may need this after age 36. Pneumococcal 13-valent conjugate (PCV13) vaccine. One dose is recommended after age 13. Pneumococcal polysaccharide (PPSV23) vaccine. One dose is recommended after age 49. Talk to your health care provider about which screenings and vaccines you need and how often you need them. This information is not intended to replace advice given to you by your health care provider. Make sure you discuss any questions you have with your health care provider. Document Released: 08/31/2015 Document Revised: 04/23/2016 Document Reviewed: 06/05/2015 Elsevier Interactive Patient Education  2017 [employer] Prevention in the Home Falls can cause injuries. They can happen to people of all ages. There are many things you can do to make your home safe and to help prevent falls. What can I do on the outside of my home? Regularly fix the edges of walkways and driveways and fix any cracks. Remove anything that might make you trip as you walk through a door, such as a raised step or threshold. Trim any bushes or trees on the path to your home. Use bright outdoor lighting. Clear any walking paths of anything that might make someone trip, such as rocks or tools. Regularly check to see if handrails are loose or broken. Make sure that both sides of any steps have handrails. Any raised decks and porches should have guardrails on the edges. Have any leaves, snow, or ice cleared regularly. Use sand or salt on walking paths during winter. Clean up any spills in your garage right away. This includes oil or grease spills. What can I do in the bathroom? Use night lights. Install grab bars by the toilet and in the tub and shower. Do not use towel bars as grab bars. Use non-skid mats or decals in the tub or shower. If you need to sit down in  the shower, use a plastic, non-slip stool. Keep the floor dry. Clean up any water that spills on the floor as soon as it happens. Remove soap buildup in the tub or shower regularly. Attach bath mats securely with double-sided non-slip rug tape. Do not have throw rugs and other things on the floor that can make you trip. What can I do in the bedroom? Use night lights. Make sure that you have a light by your bed that is easy to reach. Do not use any sheets or blankets that are too big for your bed. They should not hang down onto the floor. Have a firm chair that has side arms. You can use this for support while you get dressed. Do not have throw rugs and other things on the floor that can make you trip. What can I do in the kitchen? Clean up any spills right away. Avoid walking on wet floors. Keep items that you use a lot in easy-to-reach places. If you need to reach something above you, use a strong step stool that has a grab  bar. Keep electrical cords out of the way. Do not use floor polish or wax that makes floors slippery. If you must use wax, use non-skid floor wax. Do not have throw rugs and other things on the floor that can make you trip. What can I do with my stairs? Do not leave any items on the stairs. Make sure that there are handrails on both sides of the stairs and use them. Fix handrails that are broken or loose. Make sure that handrails are as long as the stairways. Check any carpeting to make sure that it is firmly attached to the stairs. Fix any carpet that is loose or worn. Avoid having throw rugs at the top or bottom of the stairs. If you do have throw rugs, attach them to the floor with carpet tape. Make sure that you have a light switch at the top of the stairs and the bottom of the stairs. If you do not have them, ask someone to add them for you. What else can I do to help prevent falls? Wear shoes that: Do not have high heels. Have rubber bottoms. Are comfortable  and fit you well. Are closed at the toe. Do not wear sandals. If you use a stepladder: Make sure that it is fully opened. Do not climb a closed stepladder. Make sure that both sides of the stepladder are locked into place. Ask someone to hold it for you, if possible. Clearly mark and make sure that you can see: Any grab bars or handrails. First and last steps. Where the edge of each step is. Use tools that help you move around (mobility aids) if they are needed. These include: Canes. Walkers. Scooters. Crutches. Turn on the lights when you go into a dark area. Replace any light bulbs as soon as they burn out. Set up your furniture so you have a clear path. Avoid moving your furniture around. If any of your floors are uneven, fix them. If there are any pets around you, be aware of where they are. Review your medicines with your doctor. Some medicines can make you feel dizzy. This can increase your chance of falling. Ask your doctor what other things that you can do to help prevent falls. This information is not intended to replace advice given to you by your health care provider. Make sure you discuss any questions you have with your health care provider. Document Released: 05/31/2009 Document Revised: 01/10/2016 Document Reviewed: 09/08/2014 Elsevier Interactive Patient Education  2017 [employer].

## 2021-10-04 NOTE — Progress Notes (Signed)
Subjective:   Alicia Mccann is a 88 y.o. female who presents for Medicare Annual (Subsequent) preventive examination.  Review of Systems    No ROS.  Medicare Wellness Virtual Visit.  Visual/audio telehealth visit, UTA vital signs.   See social history for additional risk factors.   Cardiac Risk Factors include: advanced age (>6055men, 63>65 women)     Objective:    Today's Vitals   10/04/21 1543  Weight: 153 lb (69.4 kg)  Height: 5\' 2"  (1.575 m)   Body mass index is 27.98 kg/m.  Advanced Directives 10/04/2021 08/10/2021 08/09/2021 04/03/2020 06/08/2019 05/18/2019 03/30/2019  Does Patient Have a Medical Advance Directive? Yes Yes Yes Yes Yes Yes Yes  Type of Estate agentAdvance Directive Healthcare Power of [place];Living will Healthcare Power of State Street Corporationttorney Healthcare Power of [employer] Corporationttorney Healthcare Power of [place];Living will Healthcare Power of [place]orney;Living will Healthcare Power of [place];Living will Living will;Healthcare Power of Attorney  Does patient want to make changes to medical advance directive? No - Patient declined Yes (Inpatient - patient defers changing a medical advance directive and declines information at this time) - No - Patient declined No - Patient declined No - Patient declined No - Patient declined  Copy of Healthcare Power of Attorney in Chart? No - copy requested No - copy requested - No - copy requested Yes - validated most recent copy scanned in chart (See row information) Yes - validated most recent copy scanned in chart (See row information) No - copy requested  Would patient like information on creating a medical advance directive? - - - - - - -    Current Medications (verified) Outpatient Encounter Medications as of 10/04/2021  Medication Sig   acetaminophen (TYLENOL) 500 MG tablet Take 1,000 mg by mouth every 8 (eight) hours as needed for mild pain or moderate pain.   ALPRAZolam (XANAX) 0.25 MG tablet Take 1 tablet (0.25 mg total) by mouth daily as needed for  anxiety.   amLODipine (NORVASC) 2.5 MG tablet Take 1 tablet (2.5 mg total) by mouth daily.   aspirin 81 MG EC tablet Take 81 mg by mouth daily. Swallow whole.   atorvastatin (LIPITOR) 10 MG tablet Take 1 tablet (10 mg total) by mouth daily.   cholecalciferol (VITAMIN D) 1000 UNITS tablet Take 1,000 Units by mouth every other day.    cyanocobalamin 100 MCG tablet Take 100 mcg by mouth daily.   sertraline (ZOLOFT) 100 MG tablet Take 1 and 1/2 tablet q day. (Patient taking differently: Take 100 mg by mouth daily.)   triamterene-hydrochlorothiazide (MAXZIDE-25) 37.5-25 MG tablet Take 1 tablet by mouth daily.   No facility-administered encounter medications on file as of 10/04/2021.    Allergies (verified) Clindamycin/lincomycin, Egg white (egg protein), Eggs or egg-derived products, Flagyl [metronidazole], Levaquin [levofloxacin], Mobic [meloxicam], Penicillins, and Sulfa antibiotics   History: Past Medical History:  Diagnosis Date   Allergic rhinitis    Anemia    Anxiety    Cellulitis    Cholelithiasis    Essential hypertension    History of stress test    a. 12/2008 Ex MV: EF 78%, no ischemia.   Insomnia    Osteoarthritis    Tachycardia    Valvular heart disease    a. 12/2008 Echo: EF 65%, mild MR;  b. 2/6 SEM RUSB - insignificant murmur, prob Ao Sclerosis.   Past Surgical History:  Procedure Laterality Date   BACK SURGERY     BUNIONECTOMY     CATARACT EXTRACTION W/PHACO Right 05/18/2019  Procedure: CATARACT EXTRACTION PHACO AND INTRAOCULAR LENS PLACEMENT (IOC) RIGHT MALYUGIN  01:06.5  20.6%  13.72;  Surgeon: Lockie Mola, MD;  Location[facility];  Service: Ophthalmology;  Laterality: Right;  please leave patient arrival 10   CATARACT EXTRACTION W/PHACO Left 06/08/2019   Procedure: CATARACT EXTRACTION PHACO AND INTRAOCULAR LENS PLACEMENT (IOC) LEFT  00:51.0  20.3%  10.37;  Surgeon: Lockie Mola, MD;  Location: [facility];  Service: Ophthalmology;   Laterality: Left;  ARRIVAL 10:30 PLEASE LEAVE   CHOLECYSTECTOMY  03/29/2007   GALLBLADDER SURGERY     HIP SURGERY     bilateral    REPLACEMENT TOTAL KNEE Right    THUMB ARTHROSCOPY     Family History  Problem Relation Age of Onset   Heart attack Father    Stroke Father    Heart disease Father    CAD Father    Heart attack Son 49       MI   Hypertension Son    Hypertension Mother    Heart disease Mother    Stroke Mother    Breast cancer Neg Hx    Social History   Socioeconomic History   Marital status: Widowed    Spouse name: Not on file   Number of children: Not on file   Years of education: Not on file   Highest education level: Not on file  Occupational History   Not on file  Tobacco Use   Smoking status: Never   Smokeless tobacco: Never  Vaping Use   Vaping Use: Never used  Substance and Sexual Activity   Alcohol use: Yes    Comment: 2 oz QOD   Drug use: No   Sexual activity: Not Currently  Other Topics Concern   Not on file  Social History Narrative   Not on file   Social Determinants of Health   Financial Resource Strain: Low Risk    Difficulty of Paying Living Expenses: Not hard at all  Food Insecurity: No Food Insecurity   Worried About Programme researcher, broadcasting/film/video in the Last Year: Never true   Ran Out of Food in the Last Year: Never true  Transportation Needs: No Transportation Needs   Lack of Transportation (Medical): No   Lack of Transportation (Non-Medical): No  Physical Activity: Insufficiently Active   Days of Exercise per Week: 4 days   Minutes of Exercise per Session: 20 min  Stress: No Stress Concern Present   Feeling of Stress : Not at all  Social Connections: Unknown   Frequency of Communication with Friends and Family: Not on file   Frequency of Social Gatherings with Friends and Family: Not on file   Attends Religious Services: Not on file   Active Member of Clubs or Organizations: Not on file   Attends Banker Meetings: Not  on file   Marital Status: Widowed    Tobacco Counseling Counseling given: Not Answered   Clinical Intake:  Pre-visit preparation completed: Yes        Diabetes: No  How often do you need to have someone help you when you read instructions, pamphlets, or other written materials from your doctor or pharmacy?: 1 - Never    Interpreter Needed?: No      Activities of Daily Living In your present state of health, do you have any difficulty performing the following activities: 10/04/2021 08/10/2021  Hearing? Y Y  Comment Hearing aids -  Vision? N N  Difficulty concentrating or making decisions? N  N  Walking or climbing stairs? Y N  Comment Ambulates with cane -  Dressing or bathing? N N  Doing errands, shopping? N N  Preparing Food and eating ? N -  Using the Toilet? N -  In the past six months, have you accidently leaked urine? N -  Do you have problems with loss of bowel control? N -  Managing your Medications? N -  Managing your Finances? N -  Housekeeping or managing your Housekeeping? Y -  Comment Maid assist -  Some recent data might be hidden   Patient Care Team: [facility], MD as PCP - General (Internal Medicine)  Indicate any recent Medical Services you may have received from other than [facility] providers in the past year (date may be approximate).     Assessment:   This is a routine wellness examination for [place].  Virtual Visit via Telephone Note  I connected with  Alicia Mccann on 10/04/21 at  3:30 PM EST by telephone and verified that I am speaking with the correct person using two identifiers.  Persons participating in the virtual visit: patient/Nurse Health Advisor   I discussed the limitations, risks, security and privacy concerns of performing an evaluation and management service by telephone and the availability of in person appointments. The patient expressed understanding and agreed to proceed.  Interactive audio and video  telecommunications were attempted between this nurse and patient, however failed, due to patient having technical difficulties OR patient did not have access to video capability.  We continued and completed visit with audio only.  Some vital signs may be absent or patient reported.   Hearing/Vision screen Hearing Screening - Comments:: Hearing aids Vision Screening - Comments:: Followed by [facility]  Wears corrective lenses  Cataract extraction, bilateral They have seen their ophthalmologist in the last 12 months.   Dietary issues and exercise activities discussed: Current Exercise Habits: Home exercise routine, Type of exercise: walking, Intensity: Mild Regular diet Good water intake   Goals Addressed               This Visit's Progress     Patient Stated     Weight (lb) < 155 lb (70.3 kg) (pt-stated)   153 lb (69.4 kg)     Snack smart  Portion control Walk more for exercise       Depression Screen PHQ 2/9 Scores 10/04/2021 09/17/2021 07/19/2021 04/02/2021 10/11/2020 06/29/2020 04/03/2020  PHQ - 2 Score 0 0 2 0 3 0 1  PHQ- 9 Score - - 8 0 4 0 -    Fall Risk Fall Risk  10/04/2021 09/17/2021 08/07/2021 07/19/2021 04/02/2021  Falls in the past year? 0 0 0 1 0  Number falls in past yr: 0 0 0 0 -  Injury with Fall? - 0 0 0 -  Risk for fall due to : - No Fall Risks - Other (Comment) -  Risk for fall due to: Comment - - - syncope -  Follow up Falls evaluation completed Falls evaluation completed Falls evaluation completed Falls evaluation completed Falls evaluation completed   FALL RISK PREVENTION PERTAINING TO THE HOME: Home free of loose throw rugs in walkways, pet beds, electrical cords, etc? Yes  Adequate lighting in your home to reduce risk of falls? Yes   ASSISTIVE DEVICES UTILIZED TO PREVENT FALLS: Life alert? No  Use of a cane, walker or w/c? Yes   TIMED UP AND GO: Was the test performed? No .   Cognitive  Function: Patient is alert and oriented x3. MMSE  - Mini Mental State Exam 03/16/2018  Orientation to time 5  Orientation to Place 5  Registration 3  Attention/ Calculation 5  Recall 3  Language- name 2 objects 2  Language- repeat 1  Language- follow 3 step command 3  Language- read & follow direction 1  Write a sentence 1  Copy design 1  Total score 30     6CIT Screen 10/04/2021 04/03/2020 03/30/2019  What Year? 0 points 0 points 0 points  What month? 0 points 0 points 0 points  What time? 0 points - 0 points  Count back from 20 0 points - 0 points  Months in reverse 0 points 0 points 0 points  Repeat phrase 2 points 0 points 0 points  Total Score 2 - 0   Immunizations Immunization History  Administered Date(s) Administered   Influenza Split 05/16/2010, 07/22/2012   Influenza, High Dose Seasonal PF 06/09/2014, 06/07/2015, 05/08/2017, 06/01/2019, 05/29/2020   Influenza,inj,Quad PF,6+ Mos 05/18/2013   Influenza,inj,quad, With Preservative 06/18/2018   Influenza-Unspecified 06/11/2016, 06/18/2018, 06/21/2020, 06/19/2021   PFIZER(Purple Top)SARS-COV-2 Vaccination 09/12/2019, 10/03/2019, 07/24/2020   Pneumococcal Conjugate-13 06/09/2014   Pneumococcal Polysaccharide-23 06/18/2004   Td 05/24/2007   Zoster, Live 01/28/2008   TDAP status: Due, Education has been provided regarding the importance of this vaccine. Advised may receive this vaccine at local pharmacy or Health Dept. Aware to provide a copy of the vaccination record if obtained from local pharmacy or Health Dept. Verbalized acceptance and understanding.  Screening Tests Health Maintenance  Topic Date Due   COVID-19 Vaccine (4 - Booster for Pfizer series) 10/05/2021 (Originally 09/18/2020)   Zoster Vaccines- Shingrix (1 of 2) 12/17/2021 (Originally 05/28/1985)   TETANUS/TDAP  10/04/2022 (Originally 05/23/2017)   Pneumonia Vaccine 20+ Years old  Completed   INFLUENZA VACCINE  Completed   DEXA SCAN  Completed   HPV VACCINES  Aged Out   Health Maintenance There are no  preventive care reminders to display for this patient.  Lung Cancer Screening: (Low Dose CT Chest recommended if Age 65-80 years, 30 pack-year currently smoking OR have quit w/in 15years.) does not qualify.   Hepatitis C Screening: does not qualify.  Vision Screening: Recommended annual ophthalmology exams for early detection of glaucoma and other disorders of the eye.  Dental Screening: Recommended annual dental exams for proper oral hygiene  Community Resource Referral / Chronic Care Management: CRR required this visit?  No   CCM required this visit?  No      Plan:   Keep all routine maintenance appointments.   I have personally reviewed and noted the following in the patients chart:   Medical and social history Use of alcohol, tobacco or illicit drugs  Current medications and supplements including opioid prescriptions.  Functional ability and status Nutritional status Physical activity Advanced directives List of other physicians Hospitalizations, surgeries, and ER visits in previous 12 months Vitals Screenings to include cognitive, depression, and falls Referrals and appointments  In addition, I have reviewed and discussed with patient certain preventive protocols, quality metrics, and best practice recommendations. A written personalized care plan for preventive services as well as general preventive health recommendations were provided to patient via mychart.     Ashok Pall, LPN   1/[id number]/8887

## 2021-10-16 ENCOUNTER — Other Ambulatory Visit: Payer: Self-pay

## 2021-10-16 ENCOUNTER — Other Ambulatory Visit (INDEPENDENT_AMBULATORY_CARE_PROVIDER_SITE_OTHER): Payer: Medicare PPO

## 2021-10-16 DIAGNOSIS — D72829 Elevated white blood cell count, unspecified: Secondary | ICD-10-CM

## 2021-10-16 LAB — CBC WITH DIFFERENTIAL/PLATELET
Basophils Absolute: 0.1 10*3/uL (ref 0.0–0.1)
Basophils Relative: 0.9 % (ref 0.0–3.0)
Eosinophils Absolute: 0.3 10*3/uL (ref 0.0–0.7)
Eosinophils Relative: 2.8 % (ref 0.0–5.0)
HCT: 39.2 % (ref 36.0–46.0)
Hemoglobin: 12.5 g/dL (ref 12.0–15.0)
Lymphocytes Relative: 18.2 % (ref 12.0–46.0)
Lymphs Abs: 1.9 10*3/uL (ref 0.7–4.0)
MCHC: 32 g/dL (ref 30.0–36.0)
MCV: 88.1 fl (ref 78.0–100.0)
Monocytes Absolute: 0.6 10*3/uL (ref 0.1–1.0)
Monocytes Relative: 5.5 % (ref 3.0–12.0)
Neutro Abs: 7.5 10*3/uL (ref 1.4–7.7)
Neutrophils Relative %: 72.6 % (ref 43.0–77.0)
Platelets: 266 10*3/uL (ref 150.0–400.0)
RBC: 4.45 Mil/uL (ref 3.87–5.11)
RDW: 13.6 % (ref 11.5–15.5)
WBC: 10.4 10*3/uL (ref 4.0–10.5)

## 2021-10-22 DIAGNOSIS — M65322 Trigger finger, left index finger: Secondary | ICD-10-CM | POA: Diagnosis not present

## 2021-10-24 ENCOUNTER — Ambulatory Visit (INDEPENDENT_AMBULATORY_CARE_PROVIDER_SITE_OTHER): Payer: Medicare PPO | Admitting: Internal Medicine

## 2021-10-24 DIAGNOSIS — R739 Hyperglycemia, unspecified: Secondary | ICD-10-CM | POA: Diagnosis not present

## 2021-10-24 DIAGNOSIS — F338 Other recurrent depressive disorders: Secondary | ICD-10-CM | POA: Diagnosis not present

## 2021-10-24 DIAGNOSIS — D72829 Elevated white blood cell count, unspecified: Secondary | ICD-10-CM | POA: Diagnosis not present

## 2021-10-24 DIAGNOSIS — N1831 Chronic kidney disease, stage 3a: Secondary | ICD-10-CM

## 2021-10-24 DIAGNOSIS — Z8616 Personal history of COVID-19: Secondary | ICD-10-CM

## 2021-10-24 DIAGNOSIS — I700 Atherosclerosis of aorta: Secondary | ICD-10-CM | POA: Diagnosis not present

## 2021-10-24 DIAGNOSIS — G5600 Carpal tunnel syndrome, unspecified upper limb: Secondary | ICD-10-CM

## 2021-10-24 DIAGNOSIS — F339 Major depressive disorder, recurrent, unspecified: Secondary | ICD-10-CM

## 2021-10-24 DIAGNOSIS — F419 Anxiety disorder, unspecified: Secondary | ICD-10-CM

## 2021-10-24 DIAGNOSIS — I10 Essential (primary) hypertension: Secondary | ICD-10-CM

## 2021-10-24 DIAGNOSIS — E782 Mixed hyperlipidemia: Secondary | ICD-10-CM | POA: Diagnosis not present

## 2021-10-24 DIAGNOSIS — I739 Peripheral vascular disease, unspecified: Secondary | ICD-10-CM

## 2021-10-24 NOTE — Progress Notes (Signed)
Patient ID: Alicia Mccann, female   DOB: [birth year], 88 y.o.   MRN: [id number] ? ? ?Subjective:  ? ? Patient ID: Alicia Mccann, female    DOB: [birth year], 88 y.o.   MRN: [id number] ? ?This visit occurred during the SARS-CoV-2 public health emergency.  Safety protocols were in place, including screening questions prior to the visit, additional usage of staff PPE, and extensive cleaning of exam room while observing appropriate contact time as indicated for disinfecting solutions.  ? ?Patient here for No chief complaint on file. ? .  ? ?HPI ?Admitted 08/09/21 - confusion and weakness. Diagnosed with covid. Treated with remdesivir and prednisone.  Still with fatigue.  Saw Dr Mariah MillingGollan 09/09/21 - no further cardiac w/up felt warranted.  [facility] - injection left knee.  Referred to ortho.   ? ? ?Past Medical History:  ?Diagnosis Date  ? Allergic rhinitis   ? Anemia   ? Anxiety   ? Cellulitis   ? Cholelithiasis   ? Essential hypertension   ? History of stress test   ? a. 12/2008 Ex MV: EF 78%, no ischemia.  ? Insomnia   ? Osteoarthritis   ? Tachycardia   ? Valvular heart disease   ? a. 12/2008 Echo: EF 65%, mild MR;  b. 2/6 SEM RUSB - insignificant murmur, prob Ao Sclerosis.  ? ?Past Surgical History:  ?Procedure Laterality Date  ? BACK SURGERY    ? BUNIONECTOMY    ? CATARACT EXTRACTION W/PHACO Right 05/18/2019  ? Procedure: CATARACT EXTRACTION PHACO AND INTRAOCULAR LENS PLACEMENT (IOC) RIGHT MALYUGIN  01:06.5  20.6%  13.72;  Surgeon: Lockie MolaBrasington, Chadwick, MD;  [facility];  Service: Ophthalmology;  Laterality: Right;  please leave patient arrival 10  ? CATARACT EXTRACTION W/PHACO Left 06/08/2019  ? Procedure: CATARACT EXTRACTION PHACO AND INTRAOCULAR LENS PLACEMENT (IOC) LEFT  00:51.0  20.3%  10.37;  Surgeon: Lockie MolaBrasington, Chadwick, MD;  [facility];  Service: Ophthalmology;  Laterality: Left;  ARRIVAL 10:30 PLEASE LEAVE  ? CHOLECYSTECTOMY  03/29/2007  ? GALLBLADDER SURGERY    ? HIP  SURGERY    ? bilateral   ? REPLACEMENT TOTAL KNEE Right   ? THUMB ARTHROSCOPY    ? ?Family History  ?Problem Relation Age of Onset  ? Heart attack Father   ? Stroke Father   ? Heart disease Father   ? CAD Father   ? Heart attack Son [id number]  ?     MI  ? Hypertension Son   ? Hypertension Mother   ? Heart disease Mother   ? Stroke Mother   ? Breast cancer Neg Hx   ? ?Social History  ? ?Socioeconomic History  ? Marital status: Widowed  ?  Spouse name: Not on file  ? Number of children: Not on file  ? Years of education: Not on file  ? Highest education level: Not on file  ?Occupational History  ? Not on file  ?Tobacco Use  ? Smoking status: Never  ? Smokeless tobacco: Never  ?Vaping Use  ? Vaping Use: Never used  ?Substance and Sexual Activity  ? Alcohol use: Yes  ?  Comment: 2 oz QOD  ? Drug use: No  ? Sexual activity: Not Currently  ?Other Topics Concern  ? Not on file  ?Social History Narrative  ? Not on file  ? ?Social Determinants of Health  ? ?Financial Resource Strain: Low Risk   ? Difficulty of Paying Living Expenses: Not hard at all  ?  Food Insecurity: No Food Insecurity  ? Worried About Programme researcher, broadcasting/film/video in the Last Year: Never true  ? Ran Out of Food in the Last Year: Never true  ?Transportation Needs: No Transportation Needs  ? Lack of Transportation (Medical): No  ? Lack of Transportation (Non-Medical): No  ?Physical Activity: Insufficiently Active  ? Days of Exercise per Week: 4 days  ? Minutes of Exercise per Session: 20 min  ?Stress: No Stress Concern Present  ? Feeling of Stress : Not at all  ?Social Connections: Unknown  ? Frequency of Communication with Friends and Family: Not on file  ? Frequency of Social Gatherings with Friends and Family: Not on file  ? Attends Religious Services: Not on file  ? Active Member of Clubs or Organizations: Not on file  ? Attends Banker Meetings: Not on file  ? Marital Status: Widowed  ? ? ? ?Review of Systems ? ?   ?Objective:  ?  ? ?There were no vitals  taken for this visit. ?Wt Readings from Last 3 Encounters:  ?10/04/21 153 lb (69.4 kg)  ?09/17/21 153 lb 2 oz (69.5 kg)  ?09/03/21 150 lb 6 oz (68.2 kg)  ? ? ?Physical Exam ? ? ?Outpatient Encounter Medications as of 10/24/2021  ?Medication Sig  ? acetaminophen (TYLENOL) 500 MG tablet Take 1,000 mg by mouth every 8 (eight) hours as needed for mild pain or moderate pain.  ? ALPRAZolam (XANAX) 0.25 MG tablet Take 1 tablet (0.25 mg total) by mouth daily as needed for anxiety.  ? amLODipine (NORVASC) 2.5 MG tablet Take 1 tablet (2.5 mg total) by mouth daily.  ? aspirin 81 MG EC tablet Take 81 mg by mouth daily. Swallow whole.  ? atorvastatin (LIPITOR) 10 MG tablet Take 1 tablet (10 mg total) by mouth daily.  ? cholecalciferol (VITAMIN D) 1000 UNITS tablet Take 1,000 Units by mouth every other day.   ? cyanocobalamin 100 MCG tablet Take 100 mcg by mouth daily.  ? sertraline (ZOLOFT) 100 MG tablet Take 1 and 1/2 tablet q day. (Patient taking differently: Take 100 mg by mouth daily.)  ? triamterene-hydrochlorothiazide (MAXZIDE-25) 37.5-25 MG tablet Take 1 tablet by mouth daily.  ? ?No facility-administered encounter medications on file as of 10/24/2021.  ?  ? ?Lab Results  ?Component Value Date  ? WBC 10.4 10/16/2021  ? HGB 12.5 10/16/2021  ? HCT 39.2 10/16/2021  ? PLT 266.0 10/16/2021  ? GLUCOSE 82 09/17/2021  ? CHOL 154 07/19/2021  ? TRIG 100.0 07/19/2021  ? HDL 59.70 07/19/2021  ? LDLDIRECT 66.0 09/07/2018  ? LDLCALC 75 07/19/2021  ? ALT 14 07/19/2021  ? AST 14 07/19/2021  ? NA 139 09/17/2021  ? K 4.1 09/17/2021  ? CL 102 09/17/2021  ? CREATININE 1.06 09/17/2021  ? BUN 27 (H) 09/17/2021  ? CO2 28 09/17/2021  ? TSH 1.57 04/02/2021  ? INR 0.9 01/16/2009  ? HGBA1C 6.1 07/19/2021  ? ? ?DG Chest 2 View ? ?Result Date: 08/09/2021 ?CLINICAL DATA:  Shortness of breath and hypoxia.  COVID positive. EXAM: CHEST - 2 VIEW COMPARISON:  01/09/2018 and CT chest 03/18/2019. FINDINGS: Trachea is midline. Heart size normal. Bibasilar  interstitial and airspace opacification, new from prior exam. No dense airspace consolidation or definite pleural fluid. Degenerative changes in left shoulder. IMPRESSION: Bibasilar mixed interstitial and airspace opacification is indicative of COVID pneumonia. Electronically Signed   By: Leanna Battles M.D.   On: 08/09/2021 13:52  ? ? ?   ?  Assessment & Plan:  ? ?Problem List Items Addressed This Visit   ?None ? ? ? ?[place], MD  ?

## 2021-10-24 NOTE — Progress Notes (Unsigned)
Patient ID: Alicia Mccann, female   DOB: 03-10-1933, 88 y.o.   MRN: [id number]   Virtual Visit via telephone Note  This visit type was conducted due to national recommendations for restrictions regarding the COVID-19 pandemic (e.g. social distancing).  This format is felt to be most appropriate for this patient at this time.  All issues noted in this document were discussed and addressed.  No physical exam was performed (except for noted visual exam findings with Video Visits).   I connected with [name] by telephone and verified that I am speaking with the correct person using two identifiers. Location patient: home Location provider: work  Persons participating in the telephone visit: patient, provider  The limitations, risks, security and privacy concerns of performing an evaluation and management service by telephone and the availability of in person appointments have been discussed. It has also been discussed with the patient that there may be a patient responsible charge related to this service. The patient expressed understanding and agreed to proceed.   Reason for visit: work in appt.   HPI: Work in to discuss persistent fatigue. Admitted 08/09/21 - confusion and weakness. Diagnosed with covid. Treated with remdesivir and prednisone.  Still with fatigue.  Saw Dr Mariah Milling 09/09/21 - no further cardiac w/up felt warranted.  Saw Dr Gavin Potters - injection left knee.  Referred to ortho (Dr Ernest Pine).  She also saw Dr Stephenie Acres for trigger finger and hand tingling.  Had her scheduled for surgery, but she decided to take tylenol and hold on surgery.  Stable.  No chest pain or sob reported.  No abdominal pain or bowel change reported.  Has a lady who comes in and helps out - on Tuesdays.  Has started walking some recently.   ROS: See pertinent positives and negatives per HPI.  Past Medical History:  Diagnosis Date   Allergic rhinitis    Anemia    Anxiety    Cellulitis    Cholelithiasis     Essential hypertension    History of stress test    a. 12/2008 Ex MV: EF 78%, no ischemia.   Insomnia    Osteoarthritis    Tachycardia    Valvular heart disease    a. 12/2008 Echo: EF 65%, mild MR;  b. 2/6 SEM RUSB - insignificant murmur, prob Ao Sclerosis.    Past Surgical History:  Procedure Laterality Date   BACK SURGERY     BUNIONECTOMY     CATARACT EXTRACTION W/PHACO Right 05/18/2019   Procedure: CATARACT EXTRACTION PHACO AND INTRAOCULAR LENS PLACEMENT (IOC) RIGHT MALYUGIN  01:06.5  20.6%  13.72;  Surgeon: Lockie Mola, MD;  Location: [facility];  Service: Ophthalmology;  Laterality: Right;  please leave patient arrival 10   CATARACT EXTRACTION W/PHACO Left 06/08/2019   Procedure: CATARACT EXTRACTION PHACO AND INTRAOCULAR LENS PLACEMENT (IOC) LEFT  00:51.0  20.3%  10.37;  Surgeon: Lockie Mola, MD;  Location: [facility];  Service: Ophthalmology;  Laterality: Left;  ARRIVAL 10:30 PLEASE LEAVE   CHOLECYSTECTOMY  03/29/2007   GALLBLADDER SURGERY     HIP SURGERY     bilateral    REPLACEMENT TOTAL KNEE Right    THUMB ARTHROSCOPY      Family History  Problem Relation Age of Onset   Heart attack Father    Stroke Father    Heart disease Father    CAD Father    Heart attack Son 66       MI   Hypertension Son  Hypertension Mother    Heart disease Mother    Stroke Mother    Breast cancer Neg Hx     SOCIAL HX: reviewed.    Current Outpatient Medications:    acetaminophen (TYLENOL) 500 MG tablet, Take 1,000 mg by mouth every 8 (eight) hours as needed for mild pain or moderate pain., Disp: , Rfl:    ALPRAZolam (XANAX) 0.25 MG tablet, Take 1 tablet (0.25 mg total) by mouth daily as needed for anxiety., Disp: 30 tablet, Rfl: 0   amLODipine (NORVASC) 2.5 MG tablet, Take 1 tablet (2.5 mg total) by mouth daily., Disp: 90 tablet, Rfl: 3   aspirin 81 MG EC tablet, Take 81 mg by mouth daily. Swallow whole., Disp: , Rfl:    atorvastatin (LIPITOR) 10 MG  tablet, Take 1 tablet (10 mg total) by mouth daily., Disp: 90 tablet, Rfl: 3   cholecalciferol (VITAMIN D) 1000 UNITS tablet, Take 1,000 Units by mouth every other day. , Disp: , Rfl:    cyanocobalamin 100 MCG tablet, Take 100 mcg by mouth daily., Disp: , Rfl:    sertraline (ZOLOFT) 100 MG tablet, Take 1 and 1/2 tablet q day. (Patient taking differently: Take 100 mg by mouth daily.), Disp: 135 tablet, Rfl: 1   triamterene-hydrochlorothiazide (MAXZIDE-25) 37.5-25 MG tablet, Take 1 tablet by mouth daily., Disp: 90 tablet, Rfl: 3  EXAM:  GENERAL: alert. Sounds to be in no acute distress. Answering questions appropriately.   PSYCH/NEURO: pleasant and cooperative, no obvious depression or anxiety, speech and thought processing grossly intact  ASSESSMENT AND PLAN:  Discussed the following assessment and plan:  Problem List Items Addressed This Visit   None   No follow-ups on file.   I discussed the assessment and treatment plan with the patient. The patient was provided an opportunity to ask questions and all were answered. The patient agreed with the plan and demonstrated an understanding of the instructions.   The patient was advised to call back or seek an in-person evaluation if the symptoms worsen or if the condition fails to improve as anticipated.  I provided 22 minutes of non-face-to-face time during this encounter.   Dale Fowler, MD

## 2021-11-03 ENCOUNTER — Encounter: Payer: Self-pay | Admitting: Internal Medicine

## 2021-11-03 ENCOUNTER — Telehealth: Payer: Self-pay | Admitting: Internal Medicine

## 2021-11-03 DIAGNOSIS — R739 Hyperglycemia, unspecified: Secondary | ICD-10-CM

## 2021-11-03 DIAGNOSIS — I10 Essential (primary) hypertension: Secondary | ICD-10-CM

## 2021-11-03 DIAGNOSIS — E782 Mixed hyperlipidemia: Secondary | ICD-10-CM

## 2021-11-03 NOTE — Assessment & Plan Note (Signed)
Continue to avoid antiinflammatories.  Stay hydrated.  Follow metabolic panel.  

## 2021-11-03 NOTE — Assessment & Plan Note (Signed)
Increased stress.  Discussed.  Notify me if feels needs any further intervention.  Follow.  ?

## 2021-11-03 NOTE — Assessment & Plan Note (Signed)
Low carb diet and exercise.  Follow met b and a1c.   ?

## 2021-11-03 NOTE — Assessment & Plan Note (Signed)
Continue triam/hctz and amlodipine.  Follow pressures.  Follow metabolic panel.  

## 2021-11-03 NOTE — Assessment & Plan Note (Signed)
On crestor.  Low cholesterol diet and exercise.  Follow lipid panel and liver function tests.   

## 2021-11-03 NOTE — Assessment & Plan Note (Signed)
Continue crestor and aspirin.  

## 2021-11-03 NOTE — Assessment & Plan Note (Signed)
Continue atorvastatin

## 2021-11-03 NOTE — Assessment & Plan Note (Signed)
Evaluated by Dr Peggye Ley.  Desires to hold on surgery.  Follow.  ?

## 2021-11-03 NOTE — Assessment & Plan Note (Signed)
Increased stress and anxiety as outlined.  On zoloft.  Discussed.  On zoloft - 150mg q day.  Follow.   

## 2021-11-03 NOTE — Telephone Encounter (Signed)
Needs a f/u appt in 2 months and fasting labs 1-2 days before f/u appt.  Thanks   ?

## 2021-11-03 NOTE — Assessment & Plan Note (Signed)
Repeat wbc count wnl.  Discussed with her.  ?

## 2021-11-03 NOTE — Assessment & Plan Note (Signed)
Recent covid infection.  Doing better.  Residual fatigue, but has improved.  Follow.  ?

## 2021-11-03 NOTE — Assessment & Plan Note (Signed)
Discussed.  On zoloft.  Continue.  Follow.   

## 2021-11-05 NOTE — Telephone Encounter (Signed)
Can you schedule this and I will order labs ?

## 2021-11-05 NOTE — Telephone Encounter (Signed)
Labs ordered.

## 2021-11-05 NOTE — Addendum Note (Signed)
Addended by: Larry Sierras on: 11/05/2021 03:44 PM ? ? Modules accepted: Orders ? ?

## 2021-11-08 DIAGNOSIS — Z85828 Personal history of other malignant neoplasm of skin: Secondary | ICD-10-CM | POA: Diagnosis not present

## 2021-11-08 DIAGNOSIS — D2272 Melanocytic nevi of left lower limb, including hip: Secondary | ICD-10-CM | POA: Diagnosis not present

## 2021-11-08 DIAGNOSIS — X32XXXA Exposure to sunlight, initial encounter: Secondary | ICD-10-CM | POA: Diagnosis not present

## 2021-11-08 DIAGNOSIS — L565 Disseminated superficial actinic porokeratosis (DSAP): Secondary | ICD-10-CM | POA: Diagnosis not present

## 2021-11-08 DIAGNOSIS — D225 Melanocytic nevi of trunk: Secondary | ICD-10-CM | POA: Diagnosis not present

## 2021-11-08 DIAGNOSIS — D2271 Melanocytic nevi of right lower limb, including hip: Secondary | ICD-10-CM | POA: Diagnosis not present

## 2021-11-08 DIAGNOSIS — L570 Actinic keratosis: Secondary | ICD-10-CM | POA: Diagnosis not present

## 2021-11-11 ENCOUNTER — Other Ambulatory Visit: Payer: Self-pay

## 2021-11-11 ENCOUNTER — Ambulatory Visit
Admission: RE | Admit: 2021-11-11 | Discharge: 2021-11-11 | Disposition: A | Discharge status: Home / Self Care | Payer: Medicare PPO | Source: Ambulatory Visit | Attending: Internal Medicine | Admitting: Internal Medicine

## 2021-11-11 ENCOUNTER — Other Ambulatory Visit: Payer: Self-pay | Admitting: Internal Medicine

## 2021-11-11 DIAGNOSIS — N6310 Unspecified lump in the right breast, unspecified quadrant: Secondary | ICD-10-CM

## 2021-11-11 DIAGNOSIS — N6320 Unspecified lump in the left breast, unspecified quadrant: Secondary | ICD-10-CM

## 2021-11-11 DIAGNOSIS — Z1231 Encounter for screening mammogram for malignant neoplasm of breast: Secondary | ICD-10-CM | POA: Insufficient documentation

## 2021-11-12 ENCOUNTER — Telehealth: Payer: Self-pay | Admitting: Internal Medicine

## 2021-11-12 ENCOUNTER — Other Ambulatory Visit: Payer: Self-pay | Admitting: Internal Medicine

## 2021-11-12 NOTE — Telephone Encounter (Signed)
Pt called in requesting to speak with Dr. Lorin Picket... Pt stated that she would like to speak with her about all the tests that she needs to have... Pt stated she would like clarity of why all the tests need to be done... Pt requesting callback  ?

## 2021-11-19 NOTE — Telephone Encounter (Signed)
S/W - explained ?

## 2021-11-29 ENCOUNTER — Ambulatory Visit
Admission: RE | Admit: 2021-11-29 | Discharge: 2021-11-29 | Disposition: A | Discharge status: Home / Self Care | Payer: Medicare PPO | Source: Ambulatory Visit | Attending: Internal Medicine | Admitting: Internal Medicine

## 2021-11-29 DIAGNOSIS — Z1239 Encounter for other screening for malignant neoplasm of breast: Secondary | ICD-10-CM | POA: Diagnosis not present

## 2021-11-29 DIAGNOSIS — L700 Acne vulgaris: Secondary | ICD-10-CM | POA: Insufficient documentation

## 2021-11-29 DIAGNOSIS — N6320 Unspecified lump in the left breast, unspecified quadrant: Secondary | ICD-10-CM

## 2021-11-29 DIAGNOSIS — R928 Other abnormal and inconclusive findings on diagnostic imaging of breast: Secondary | ICD-10-CM | POA: Diagnosis not present

## 2021-12-11 ENCOUNTER — Other Ambulatory Visit: Payer: Self-pay | Admitting: Internal Medicine

## 2021-12-25 DIAGNOSIS — G8929 Other chronic pain: Secondary | ICD-10-CM | POA: Diagnosis not present

## 2021-12-25 DIAGNOSIS — M25512 Pain in left shoulder: Secondary | ICD-10-CM | POA: Diagnosis not present

## 2022-01-06 ENCOUNTER — Other Ambulatory Visit (INDEPENDENT_AMBULATORY_CARE_PROVIDER_SITE_OTHER): Payer: Medicare PPO

## 2022-01-06 DIAGNOSIS — E782 Mixed hyperlipidemia: Secondary | ICD-10-CM | POA: Diagnosis not present

## 2022-01-06 DIAGNOSIS — R739 Hyperglycemia, unspecified: Secondary | ICD-10-CM | POA: Diagnosis not present

## 2022-01-06 DIAGNOSIS — I10 Essential (primary) hypertension: Secondary | ICD-10-CM

## 2022-01-06 LAB — HEPATIC FUNCTION PANEL
ALT: 14 U/L (ref 0–35)
AST: 13 U/L (ref 0–37)
Albumin: 4.2 g/dL (ref 3.5–5.2)
Alkaline Phosphatase: 70 U/L (ref 39–117)
Bilirubin, Direct: 0.1 mg/dL (ref 0.0–0.3)
Total Bilirubin: 0.6 mg/dL (ref 0.2–1.2)
Total Protein: 6.2 g/dL (ref 6.0–8.3)

## 2022-01-06 LAB — BASIC METABOLIC PANEL
BUN: 30 mg/dL — ABNORMAL HIGH (ref 6–23)
CO2: 30 mEq/L (ref 19–32)
Calcium: 10 mg/dL (ref 8.4–10.5)
Chloride: 103 mEq/L (ref 96–112)
Creatinine, Ser: 1.1 mg/dL (ref 0.40–1.20)
GFR: 45.47 mL/min — ABNORMAL LOW (ref >60.00)
Glucose, Bld: 87 mg/dL (ref 70–99)
Potassium: 3.6 mEq/L (ref 3.5–5.1)
Sodium: 141 mEq/L (ref 135–145)

## 2022-01-06 LAB — LIPID PANEL
Cholesterol: 139 mg/dL (ref 0–200)
HDL: 58.7 mg/dL (ref >39.00)
LDL Cholesterol: 63 mg/dL (ref 0–99)
NonHDL: 80.02
Total CHOL/HDL Ratio: 2
Triglycerides: 86 mg/dL (ref 0.0–149.0)
VLDL: 17.2 mg/dL (ref 0.0–40.0)

## 2022-01-06 LAB — HEMOGLOBIN A1C: Hgb A1c MFr Bld: 6 % (ref 4.6–6.5)

## 2022-01-08 ENCOUNTER — Ambulatory Visit: Payer: Medicare PPO | Admitting: Internal Medicine

## 2022-01-08 ENCOUNTER — Encounter: Payer: Self-pay | Admitting: Internal Medicine

## 2022-01-08 VITALS — BP 130/70 | HR 74 | Temp 98.0°F | Resp 16 | Ht 62.0 in | Wt 154.2 lb

## 2022-01-08 DIAGNOSIS — F339 Major depressive disorder, recurrent, unspecified: Secondary | ICD-10-CM

## 2022-01-08 DIAGNOSIS — I739 Peripheral vascular disease, unspecified: Secondary | ICD-10-CM

## 2022-01-08 DIAGNOSIS — R5383 Other fatigue: Secondary | ICD-10-CM

## 2022-01-08 DIAGNOSIS — N1831 Chronic kidney disease, stage 3a: Secondary | ICD-10-CM

## 2022-01-08 DIAGNOSIS — R911 Solitary pulmonary nodule: Secondary | ICD-10-CM

## 2022-01-08 DIAGNOSIS — R5381 Other malaise: Secondary | ICD-10-CM

## 2022-01-08 DIAGNOSIS — F419 Anxiety disorder, unspecified: Secondary | ICD-10-CM

## 2022-01-08 DIAGNOSIS — I10 Essential (primary) hypertension: Secondary | ICD-10-CM | POA: Diagnosis not present

## 2022-01-08 DIAGNOSIS — I700 Atherosclerosis of aorta: Secondary | ICD-10-CM

## 2022-01-08 DIAGNOSIS — D649 Anemia, unspecified: Secondary | ICD-10-CM

## 2022-01-08 DIAGNOSIS — E782 Mixed hyperlipidemia: Secondary | ICD-10-CM | POA: Diagnosis not present

## 2022-01-08 DIAGNOSIS — Z0000 Encounter for general adult medical examination without abnormal findings: Secondary | ICD-10-CM

## 2022-01-08 DIAGNOSIS — R739 Hyperglycemia, unspecified: Secondary | ICD-10-CM

## 2022-01-08 NOTE — Progress Notes (Signed)
Subjective:    Patient ID: Alicia Mccann, female    DOB: June 30, 1933, 88 y.o.   MRN: [id number]  Patient here for her physical exam.   Chief Complaint  Patient presents with   Follow-up    Yearly CPE   .   HPI Increased stress.  Discussed.  Her dog passed.  Recent covid.  Decreased energy/increased fatigue.  Feels worsened after having covid.  No chest pain or sob reported.  No increased cough or congestion.  No acid reflux.  No abdominal pain.  Bowels moving.  Discussed increased stress.  Discussed fatigue - probably multifactorial.  Has not been going out as much.  Has a friend who comes and helps her around the house.  Discussed going out more with her.    Past Medical History:  Diagnosis Date   Allergic rhinitis    Anemia    Anxiety    Cellulitis    Cholelithiasis    Essential hypertension    History of stress test    a. 12/2008 Ex MV: EF 78%, no ischemia.   Insomnia    Osteoarthritis    Tachycardia    Valvular heart disease    a. 12/2008 Echo: EF 65%, mild MR;  b. 2/6 SEM RUSB - insignificant murmur, prob Ao Sclerosis.   Past Surgical History:  Procedure Laterality Date   BACK SURGERY     BUNIONECTOMY     CATARACT EXTRACTION W/PHACO Right 05/18/2019   Procedure: CATARACT EXTRACTION PHACO AND INTRAOCULAR LENS PLACEMENT (IOC) RIGHT MALYUGIN  01:06.5  20.6%  13.72;  Surgeon: Leandrew Koyanagi, MD;  Location: [place];  Service: Ophthalmology;  Laterality: Right;  please leave patient arrival 10   CATARACT EXTRACTION W/PHACO Left 06/08/2019   Procedure: CATARACT EXTRACTION PHACO AND INTRAOCULAR LENS PLACEMENT (IOC) LEFT  00:51.0  20.3%  10.37;  Surgeon: Leandrew Koyanagi, MD;  Location: [place];  Service: Ophthalmology;  Laterality: Left;  ARRIVAL 10:30 PLEASE LEAVE   CHOLECYSTECTOMY  03/29/2007   GALLBLADDER SURGERY     HIP SURGERY     bilateral    REPLACEMENT TOTAL KNEE Right    THUMB ARTHROSCOPY     Family History  Problem Relation  Age of Onset   Heart attack Father    Stroke Father    Heart disease Father    CAD Father    Heart attack Son 23       MI   Hypertension Son    Hypertension Mother    Heart disease Mother    Stroke Mother    Breast cancer Neg Hx    Social History   Socioeconomic History   Marital status: Widowed    Spouse name: Not on file   Number of children: Not on file   Years of education: Not on file   Highest education level: Not on file  Occupational History   Not on file  Tobacco Use   Smoking status: Never   Smokeless tobacco: Never  Vaping Use   Vaping Use: Never used  Substance and Sexual Activity   Alcohol use: Yes    Comment: 2 oz QOD   Drug use: No   Sexual activity: Not Currently  Other Topics Concern   Not on file  Social History Narrative   Not on file   Social Determinants of Health   Financial Resource Strain: Low Risk    Difficulty of Paying Living Expenses: Not hard at all  Food Insecurity: No Food Insecurity   Worried  About Running Out of Food in the Last Year: Never true   Ran Out of Food in the Last Year: Never true  Transportation Needs: No Transportation Needs   Lack of Transportation (Medical): No   Lack of Transportation (Non-Medical): No  Physical Activity: Insufficiently Active   Days of Exercise per Week: 4 days   Minutes of Exercise per Session: 20 min  Stress: No Stress Concern Present   Feeling of Stress : Not at all  Social Connections: Unknown   Frequency of Communication with Friends and Family: Not on file   Frequency of Social Gatherings with Friends and Family: Not on file   Attends Religious Services: Not on file   Active Member of Clubs or Organizations: Not on file   Attends Archivist Meetings: Not on file   Marital Status: Widowed     Review of Systems  Constitutional:  Positive for fatigue. Negative for appetite change and unexpected weight change.  HENT:  Negative for congestion, sinus pressure and sore throat.    Eyes:  Negative for pain and visual disturbance.  Respiratory:  Negative for cough, chest tightness and shortness of breath.   Cardiovascular:  Negative for chest pain and palpitations.  Gastrointestinal:  Negative for abdominal pain, diarrhea, nausea and vomiting.  Genitourinary:  Negative for difficulty urinating and dysuria.  Musculoskeletal:  Negative for joint swelling and myalgias.  Skin:  Negative for color change and rash.  Neurological:  Negative for dizziness, light-headedness and headaches.  Hematological:  Negative for adenopathy. Does not bruise/bleed easily.  Psychiatric/Behavioral:  Negative for agitation and dysphoric mood.       Objective:     BP 130/70 (BP Location: Left Arm, Patient Position: Sitting, Cuff Size: Small)   Pulse 74   Temp 98 F (36.7 C) (Temporal)   Resp 16   Ht '5\' 2"'  (1.575 m)   Wt 154 lb 3.2 oz (69.9 kg)   SpO2 98%   BMI 28.20 kg/m  Wt Readings from Last 3 Encounters:  01/08/22 154 lb 3.2 oz (69.9 kg)  10/04/21 153 lb (69.4 kg)  09/17/21 153 lb 2 oz (69.5 kg)    Physical Exam Vitals reviewed.  Constitutional:      General: She is not in acute distress.    Appearance: Normal appearance. She is well-developed.  HENT:     Head: Normocephalic and atraumatic.     Right Ear: External ear normal.     Left Ear: External ear normal.  Eyes:     General: No scleral icterus.       Right eye: No discharge.        Left eye: No discharge.     Conjunctiva/sclera: Conjunctivae normal.  Neck:     Thyroid: No thyromegaly.  Cardiovascular:     Rate and Rhythm: Normal rate and regular rhythm.  Pulmonary:     Effort: No tachypnea, accessory muscle usage or respiratory distress.     Breath sounds: Normal breath sounds. No decreased breath sounds, wheezing or rhonchi.     Comments: Whitehead right nipple.  Chest:  Breasts:    Right: No inverted nipple, mass, nipple discharge or tenderness (no axillary adenopathy).     Left: No inverted nipple,  mass, nipple discharge or tenderness (no axilarry adenopathy).  Abdominal:     General: Bowel sounds are normal.     Palpations: Abdomen is soft.     Tenderness: There is no abdominal tenderness.  Musculoskeletal:  General: No swelling or tenderness.     Cervical back: Neck supple.  Lymphadenopathy:     Cervical: No cervical adenopathy.  Skin:    General: Skin is warm.     Findings: No erythema or rash.  Neurological:     Mental Status: She is alert and oriented to person, place, and time.  Psychiatric:        Mood and Affect: Mood normal.        Behavior: Behavior normal.     Outpatient Encounter Medications as of 01/08/2022  Medication Sig   acetaminophen (TYLENOL) 500 MG tablet Take 1,000 mg by mouth every 8 (eight) hours as needed for mild pain or moderate pain.   ALPRAZolam (XANAX) 0.25 MG tablet Take 1 tablet (0.25 mg total) by mouth daily as needed for anxiety.   amLODipine (NORVASC) 2.5 MG tablet Take 1 tablet (2.5 mg total) by mouth daily.   aspirin 81 MG EC tablet Take 81 mg by mouth daily. Swallow whole.   atorvastatin (LIPITOR) 10 MG tablet Take 1 tablet (10 mg total) by mouth daily.   cholecalciferol (VITAMIN D) 1000 UNITS tablet Take 1,000 Units by mouth every other day.    cyanocobalamin 100 MCG tablet Take 100 mcg by mouth daily.   sertraline (ZOLOFT) 100 MG tablet TAKE 1 TABLET BY MOUTH DAILY   triamterene-hydrochlorothiazide (MAXZIDE-25) 37.5-25 MG tablet Take 1 tablet by mouth daily.   No facility-administered encounter medications on file as of 01/08/2022.     Lab Results  Component Value Date   WBC 10.4 10/16/2021   HGB 12.5 10/16/2021   HCT 39.2 10/16/2021   PLT 266.0 10/16/2021   GLUCOSE 87 01/06/2022   CHOL 139 01/06/2022   TRIG 86.0 01/06/2022   HDL 58.70 01/06/2022   LDLDIRECT 66.0 09/07/2018   LDLCALC 63 01/06/2022   ALT 14 01/06/2022   AST 13 01/06/2022   NA 141 01/06/2022   K 3.6 01/06/2022   CL 103 01/06/2022   CREATININE 1.10  01/06/2022   BUN 30 (H) 01/06/2022   CO2 30 01/06/2022   TSH 1.57 04/02/2021   INR 0.9 01/16/2009   HGBA1C 6.0 01/06/2022    MM DIAG BREAST TOMO BILATERAL  Result Date: 11/29/2021 CLINICAL DATA:  Patient complains of Whitehead on the left nipple EXAM: DIGITAL DIAGNOSTIC BILATERAL MAMMOGRAM WITH TOMOSYNTHESIS AND CAD exam TECHNIQUE: Bilateral digital diagnostic mammography and breast tomosynthesis was performed. The images were evaluated with computer-aided detection. COMPARISON:  Prior films ACR Breast Density Category b: There are scattered areas of fibroglandular density. FINDINGS: Cc and MLO views of bilateral breasts are submitted. No suspicious abnormalities identified bilaterally. Physicalexam: There is a 4 mm whitehead on the left nipple. IMPRESSION: Benign findings. RECOMMENDATION: Managed on clinical basis for patient's clinical complaint. I have discussed the findings and recommendations with the patient. If applicable, a reminder letter will be sent to the patient regarding the next appointment. BI-RADS CATEGORY  2: Benign. Electronically Signed   By: Abelardo Diesel M.D.   On: 11/29/2021 11:19      Assessment & Plan:   Problem List Items Addressed This Visit     Anemia    Follow cbc.        Anxiety    Increased stress and anxiety as outlined.  On zoloft.  Discussed.  On zoloft - 168m q day.  Follow. Discussed going out more, etc.          Aortic atherosclerosis ([place]    Continue atorvastatin.  CKD (chronic kidney disease) stage 3, GFR 30-59 ml/min (HCC)    Continue to avoid antiinflammatories.  Stay hydrated.  Follow metabolic panel.        Depression, recurrent ([place])    Discussed.  On zoloft.  Continue.  Follow.         Essential hypertension    Continue triam/hctz and amlodipine.  Follow pressures.  Follow metabolic panel.        Healthcare maintenance    Physical today 01/07/22.  Mammogram 11/29/21 - Birads II.        Hyperglycemia    Low carb  diet and exercise.  Follow met b and a1c.         Lung nodule    Found incidentally on 2019 chest CT.  F/u chest CT 2020 - stable.        Malaise and fatigue    Recent covid.  Increased fatigue s/p covid.  Discussed the need for exercise, going out more,etc.  Follow.        Mixed hyperlipidemia    On crestor.  Low cholesterol diet and exercise.  Follow lipid panel and liver function tests.         PAD (peripheral artery disease) (HCC)    Continue atorvastatin and aspirin.         Other Visit Diagnoses     Routine general medical examination at a health care facility    -  Primary        Einar Pheasant, MD

## 2022-01-13 ENCOUNTER — Encounter: Payer: Self-pay | Admitting: Internal Medicine

## 2022-01-13 NOTE — Assessment & Plan Note (Signed)
Continue atorvastatin

## 2022-01-13 NOTE — Assessment & Plan Note (Signed)
Continue triam/hctz and amlodipine.  Follow pressures.  Follow metabolic panel.  

## 2022-01-13 NOTE — Assessment & Plan Note (Signed)
Follow cbc.  

## 2022-01-13 NOTE — Assessment & Plan Note (Addendum)
Continue atorvastatin and aspirin ?

## 2022-01-13 NOTE — Assessment & Plan Note (Signed)
Low carb diet and exercise.  Follow met b and a1c.   

## 2022-01-13 NOTE — Assessment & Plan Note (Signed)
Recent covid.  Increased fatigue s/p covid.  Discussed the need for exercise, going out more,etc.  Follow.

## 2022-01-13 NOTE — Assessment & Plan Note (Signed)
Found incidentally on 2019 chest CT.  F/u chest CT 2020 - stable.

## 2022-01-13 NOTE — Assessment & Plan Note (Signed)
Physical today 01/07/22.  Mammogram 11/29/21 - Birads II.

## 2022-01-13 NOTE — Assessment & Plan Note (Signed)
Increased stress and anxiety as outlined.  On zoloft.  Discussed.  On zoloft - 150mg  q day.  Follow. Discussed going out more, etc.

## 2022-01-13 NOTE — Assessment & Plan Note (Signed)
Continue to avoid antiinflammatories.  Stay hydrated.  Follow metabolic panel.  

## 2022-01-13 NOTE — Assessment & Plan Note (Signed)
Discussed.  On zoloft.  Continue.  Follow.   

## 2022-01-13 NOTE — Assessment & Plan Note (Signed)
On crestor.  Low cholesterol diet and exercise.  Follow lipid panel and liver function tests.   

## 2022-03-07 DIAGNOSIS — Z08 Encounter for follow-up examination after completed treatment for malignant neoplasm: Secondary | ICD-10-CM | POA: Diagnosis not present

## 2022-03-07 DIAGNOSIS — Z85828 Personal history of other malignant neoplasm of skin: Secondary | ICD-10-CM | POA: Diagnosis not present

## 2022-03-07 DIAGNOSIS — L570 Actinic keratosis: Secondary | ICD-10-CM | POA: Diagnosis not present

## 2022-03-07 DIAGNOSIS — D0439 Carcinoma in situ of skin of other parts of face: Secondary | ICD-10-CM | POA: Diagnosis not present

## 2022-03-07 DIAGNOSIS — D485 Neoplasm of uncertain behavior of skin: Secondary | ICD-10-CM | POA: Diagnosis not present

## 2022-03-07 DIAGNOSIS — D692 Other nonthrombocytopenic purpura: Secondary | ICD-10-CM | POA: Diagnosis not present

## 2022-03-10 ENCOUNTER — Ambulatory Visit: Payer: Medicare PPO | Admitting: Internal Medicine

## 2022-03-13 ENCOUNTER — Other Ambulatory Visit: Payer: Self-pay | Admitting: Internal Medicine

## 2022-03-26 ENCOUNTER — Ambulatory Visit: Payer: Medicare PPO | Admitting: Internal Medicine

## 2022-03-26 ENCOUNTER — Telehealth: Payer: Self-pay | Admitting: Internal Medicine

## 2022-03-26 ENCOUNTER — Encounter: Payer: Self-pay | Admitting: Internal Medicine

## 2022-03-26 VITALS — BP 118/78 | HR 72 | Temp 97.8°F | Ht 60.0 in | Wt 155.0 lb

## 2022-03-26 DIAGNOSIS — E782 Mixed hyperlipidemia: Secondary | ICD-10-CM

## 2022-03-26 DIAGNOSIS — I700 Atherosclerosis of aorta: Secondary | ICD-10-CM

## 2022-03-26 DIAGNOSIS — R21 Rash and other nonspecific skin eruption: Secondary | ICD-10-CM

## 2022-03-26 DIAGNOSIS — M25562 Pain in left knee: Secondary | ICD-10-CM | POA: Diagnosis not present

## 2022-03-26 DIAGNOSIS — I10 Essential (primary) hypertension: Secondary | ICD-10-CM | POA: Diagnosis not present

## 2022-03-26 DIAGNOSIS — R739 Hyperglycemia, unspecified: Secondary | ICD-10-CM | POA: Diagnosis not present

## 2022-03-26 DIAGNOSIS — F419 Anxiety disorder, unspecified: Secondary | ICD-10-CM | POA: Diagnosis not present

## 2022-03-26 DIAGNOSIS — N1831 Chronic kidney disease, stage 3a: Secondary | ICD-10-CM | POA: Diagnosis not present

## 2022-03-26 DIAGNOSIS — I739 Peripheral vascular disease, unspecified: Secondary | ICD-10-CM | POA: Diagnosis not present

## 2022-03-26 DIAGNOSIS — F339 Major depressive disorder, recurrent, unspecified: Secondary | ICD-10-CM

## 2022-03-26 NOTE — Telephone Encounter (Signed)
Attempted to call pt - fax machine?

## 2022-03-26 NOTE — Telephone Encounter (Signed)
Please call Ms Sek and let her know that  I spoke to Dr Durene Cal office and someone from is office is going to call her about her medication.

## 2022-03-26 NOTE — Telephone Encounter (Signed)
S/w pt - advised 

## 2022-03-26 NOTE — Progress Notes (Signed)
Patient ID: Alicia Mccann, female   DOB: 05/12/1933, 88 y.o.   MRN: [id number]   Subjective:    Patient ID: Alicia Mccann, female    DOB: 24-Feb-1933, 88 y.o.   MRN: [id number]   Patient here for a scheduled follow up.   Chief Complaint  Patient presents with   Follow-up    2 month follow up    .   HPI Here to follow up regarding increased stress and high blood pressure.  Is concerned regarding rash - face.  Seeing dermatology.  Was started on fluorouracil.  Is concerned - not helping.  Tries to stay active.  Some issues with her left knee.  Due to see Dr Marry Guan this week.  Using cane.  No chest pain.  Breathing stable.  No increased cough or congestion.  No abdominal pain.  Bowels moving.  Some nocturia.  Still with increased stress.  Lives alone, etc.  Discussed.     Past Medical History:  Diagnosis Date   Allergic rhinitis    Anemia    Anxiety    Cellulitis    Cholelithiasis    Essential hypertension    History of stress test    a. 12/2008 Ex MV: EF 78%, no ischemia.   Insomnia    Osteoarthritis    Tachycardia    Valvular heart disease    a. 12/2008 Echo: EF 65%, mild MR;  b. 2/6 SEM RUSB - insignificant murmur, prob Ao Sclerosis.   Past Surgical History:  Procedure Laterality Date   BACK SURGERY     BUNIONECTOMY     CATARACT EXTRACTION W/PHACO Right 05/18/2019   Procedure: CATARACT EXTRACTION PHACO AND INTRAOCULAR LENS PLACEMENT (IOC) RIGHT MALYUGIN  01:06.5  20.6%  13.72;  Surgeon: Leandrew Koyanagi, MD;  Location: [facility];  Service: Ophthalmology;  Laterality: Right;  please leave patient arrival 10   CATARACT EXTRACTION W/PHACO Left 06/08/2019   Procedure: CATARACT EXTRACTION PHACO AND INTRAOCULAR LENS PLACEMENT (IOC) LEFT  00:51.0  20.3%  10.37;  Surgeon: Leandrew Koyanagi, MD;  Location: [facility];  Service: Ophthalmology;  Laterality: Left;  ARRIVAL 10:30 PLEASE LEAVE   CHOLECYSTECTOMY  03/29/2007   GALLBLADDER SURGERY     HIP  SURGERY     bilateral    REPLACEMENT TOTAL KNEE Right    THUMB ARTHROSCOPY     Family History  Problem Relation Age of Onset   Heart attack Father    Stroke Father    Heart disease Father    CAD Father    Heart attack Son 47       MI   Hypertension Son    Hypertension Mother    Heart disease Mother    Stroke Mother    Breast cancer Neg Hx    Social History   Socioeconomic History   Marital status: Widowed    Spouse name: Not on file   Number of children: Not on file   Years of education: Not on file   Highest education level: Not on file  Occupational History   Not on file  Tobacco Use   Smoking status: Never   Smokeless tobacco: Never  Vaping Use   Vaping Use: Never used  Substance and Sexual Activity   Alcohol use: Yes    Comment: 2 oz QOD   Drug use: No   Sexual activity: Not Currently  Other Topics Concern   Not on file  Social History Narrative   Not on file   Social Determinants  of Health   Financial Resource Strain: Low Risk  (10/04/2021)   Overall Financial Resource Strain (CARDIA)    Difficulty of Paying Living Expenses: Not hard at all  Food Insecurity: No Food Insecurity (10/04/2021)   Hunger Vital Sign    Worried About Running Out of Food in the Last Year: Never true    Ran Out of Food in the Last Year: Never true  Transportation Needs: No Transportation Needs (10/04/2021)   PRAPARE - Hydrologist (Medical): No    Lack of Transportation (Non-Medical): No  Physical Activity: Insufficiently Active (10/04/2021)   Exercise Vital Sign    Days of Exercise per Week: 4 days    Minutes of Exercise per Session: 20 min  Stress: No Stress Concern Present (10/04/2021)   [place]    Feeling of Stress : Not at all  Social Connections: Unknown (10/04/2021)   Social Connection and Isolation Panel [NHANES]    Frequency of Communication with Friends and Family: Not on  file    Frequency of Social Gatherings with Friends and Family: Not on file    Attends Religious Services: Not on file    Active Member of Clubs or Organizations: Not on file    Attends Archivist Meetings: Not on file    Marital Status: Widowed     Review of Systems  Constitutional:  Negative for appetite change and unexpected weight change.  HENT:  Negative for congestion and sinus pressure.   Respiratory:  Negative for cough, chest tightness and shortness of breath.   Cardiovascular:  Negative for chest pain, palpitations and leg swelling.  Gastrointestinal:  Negative for abdominal pain, diarrhea, nausea and vomiting.  Genitourinary:  Negative for difficulty urinating and dysuria.  Musculoskeletal:  Negative for joint swelling and myalgias.  Skin:  Negative for color change and rash.  Neurological:  Negative for dizziness and headaches.  Psychiatric/Behavioral:         Some increased stress/anxiety aso utlined.         Objective:     BP 118/78 (BP Location: Left Arm, Patient Position: Sitting, Cuff Size: Normal)   Pulse 72   Temp 97.8 F (36.6 C) (Oral)   Ht 5' (1.524 m)   Wt 155 lb (70.3 kg)   SpO2 99%   BMI 30.27 kg/m  Wt Readings from Last 3 Encounters:  03/26/22 155 lb (70.3 kg)  01/08/22 154 lb 3.2 oz (69.9 kg)  10/04/21 153 lb (69.4 kg)    Physical Exam Vitals reviewed.  Constitutional:      General: She is not in acute distress.    Appearance: Normal appearance.  HENT:     Head: Normocephalic and atraumatic.     Right Ear: External ear normal.     Left Ear: External ear normal.  Eyes:     General: No scleral icterus.       Right eye: No discharge.        Left eye: No discharge.     Conjunctiva/sclera: Conjunctivae normal.  Neck:     Thyroid: No thyromegaly.  Cardiovascular:     Rate and Rhythm: Normal rate and regular rhythm.  Pulmonary:     Effort: No respiratory distress.     Breath sounds: Normal breath sounds. No wheezing.   Abdominal:     General: Bowel sounds are normal.     Palpations: Abdomen is soft.     Tenderness: There is no  abdominal tenderness.  Musculoskeletal:        General: No swelling or tenderness.     Cervical back: Neck supple. No tenderness.  Lymphadenopathy:     Cervical: No cervical adenopathy.  Skin:    Findings: No erythema or rash.  Neurological:     Mental Status: She is alert.  Psychiatric:        Mood and Affect: Mood normal.        Behavior: Behavior normal.      Outpatient Encounter Medications as of 03/26/2022  Medication Sig   acetaminophen (TYLENOL) 500 MG tablet Take 1,000 mg by mouth every 8 (eight) hours as needed for mild pain or moderate pain.   ALPRAZolam (XANAX) 0.25 MG tablet Take 1 tablet (0.25 mg total) by mouth daily as needed for anxiety.   amLODipine (NORVASC) 2.5 MG tablet Take 1 tablet (2.5 mg total) by mouth daily.   aspirin 81 MG EC tablet Take 81 mg by mouth daily. Swallow whole.   atorvastatin (LIPITOR) 10 MG tablet Take 1 tablet (10 mg total) by mouth daily.   cholecalciferol (VITAMIN D) 1000 UNITS tablet Take 1,000 Units by mouth every other day.    cyanocobalamin 100 MCG tablet Take 100 mcg by mouth daily.   sertraline (ZOLOFT) 100 MG tablet TAKE 1 TABLET BY MOUTH DAILY   triamterene-hydrochlorothiazide (MAXZIDE-25) 37.5-25 MG tablet Take 1 tablet by mouth daily.   No facility-administered encounter medications on file as of 03/26/2022.     Lab Results  Component Value Date   WBC 10.4 10/16/2021   HGB 12.5 10/16/2021   HCT 39.2 10/16/2021   PLT 266.0 10/16/2021   GLUCOSE 87 01/06/2022   CHOL 139 01/06/2022   TRIG 86.0 01/06/2022   HDL 58.70 01/06/2022   LDLDIRECT 66.0 09/07/2018   LDLCALC 63 01/06/2022   ALT 14 01/06/2022   AST 13 01/06/2022   NA 141 01/06/2022   K 3.6 01/06/2022   CL 103 01/06/2022   CREATININE 1.10 01/06/2022   BUN 30 (H) 01/06/2022   CO2 30 01/06/2022   TSH 1.57 04/02/2021   INR 0.9 01/16/2009   HGBA1C 6.0  01/06/2022    MM DIAG BREAST TOMO BILATERAL  Result Date: 11/29/2021 CLINICAL DATA:  Patient complains of Whitehead on the left nipple EXAM: DIGITAL DIAGNOSTIC BILATERAL MAMMOGRAM WITH TOMOSYNTHESIS AND CAD exam TECHNIQUE: Bilateral digital diagnostic mammography and breast tomosynthesis was performed. The images were evaluated with computer-aided detection. COMPARISON:  Prior films ACR Breast Density Category b: There are scattered areas of fibroglandular density. FINDINGS: Cc and MLO views of bilateral breasts are submitted. No suspicious abnormalities identified bilaterally. Physicalexam: There is a 4 mm whitehead on the left nipple. IMPRESSION: Benign findings. RECOMMENDATION: Managed on clinical basis for patient's clinical complaint. I have discussed the findings and recommendations with the patient. If applicable, a reminder letter will be sent to the patient regarding the next appointment. BI-RADS CATEGORY  2: Benign. Electronically Signed   By: Abelardo Diesel M.D.   On: 11/29/2021 11:19      Assessment & Plan:   Problem List Items Addressed This Visit     Anxiety - Primary    Increased stress and anxiety as outlined.  On zoloft.  Discussed.  On zoloft - 163m q day.  Follow.        Aortic atherosclerosis (HCC)    Continue atorvastatin.       CKD (chronic kidney disease) stage 3, GFR 30-59 ml/min (HCC)    Continue to avoid  antiinflammatories.  Stay hydrated.  Follow metabolic panel.       Depression, recurrent ([place])    Discussed.  On zoloft.  Continue.  Follow.        Essential hypertension    Continue triam/hctz and amlodipine.  Follow pressures.  Follow metabolic panel.       Relevant Orders   Basic metabolic panel   Hyperglycemia    Low carb diet and exercise.  Follow met b and a1c.        Relevant Orders   Hemoglobin A1c   Left knee pain    Seeing ortho.  Using a cane. Planning for f/u with ortho end of week.        Mixed hyperlipidemia    On crestor.  Low  cholesterol diet and exercise.  Follow lipid panel and liver function tests.        Relevant Orders   Hepatic function panel   TSH   Lipid panel   PAD (peripheral artery disease) (HCC)    Continue atorvastatin and aspirin.        Rash    Has seen Dr Evorn Gong.  Dermatology placed her on fluorouracil.  She feels is not working.  I contacted dermatology regarding her concerns regarding suing fluorauracil.  They will contact pt and discuss treatment.         Einar Pheasant, MD

## 2022-03-27 NOTE — Progress Notes (Signed)
Order(s) created erroneously. Erroneous order ID: [id number]  Order canceled by: Cleatrice Burke  Order cancel date/time: 03/27/2022 11:57 AM

## 2022-03-28 DIAGNOSIS — M1712 Unilateral primary osteoarthritis, left knee: Secondary | ICD-10-CM | POA: Diagnosis not present

## 2022-04-06 DIAGNOSIS — R21 Rash and other nonspecific skin eruption: Secondary | ICD-10-CM | POA: Insufficient documentation

## 2022-04-06 NOTE — Assessment & Plan Note (Addendum)
Seeing ortho.  Using a cane. Planning for f/u with ortho end of week.

## 2022-04-06 NOTE — Assessment & Plan Note (Signed)
Has seen Dr Adolphus Birchwood.  Dermatology placed her on fluorouracil.  She feels is not working.  I contacted dermatology regarding her concerns regarding suing fluorauracil.  They will contact pt and discuss treatment.

## 2022-04-06 NOTE — Assessment & Plan Note (Signed)
Low carb diet and exercise.  Follow met b and a1c.   

## 2022-04-06 NOTE — Assessment & Plan Note (Signed)
Continue atorvastatin

## 2022-04-06 NOTE — Assessment & Plan Note (Signed)
Continue to avoid antiinflammatories.  Stay hydrated.  Follow metabolic panel.  

## 2022-04-06 NOTE — Assessment & Plan Note (Signed)
Continue triam/hctz and amlodipine.  Follow pressures.  Follow metabolic panel.  

## 2022-04-06 NOTE — Assessment & Plan Note (Signed)
On crestor.  Low cholesterol diet and exercise.  Follow lipid panel and liver function tests.   

## 2022-04-06 NOTE — Assessment & Plan Note (Signed)
Discussed.  On zoloft.  Continue.  Follow.   

## 2022-04-06 NOTE — Progress Notes (Incomplete)
Patient ID: Alicia Mccann, female   DOB: 1933-05-07, 88 y.o.   MRN: [id number]   Subjective:    Patient ID: Alicia Mccann, female    DOB: [birth year], 88 y.o.   MRN: [id number]   Patient here for a scheduled follow up.   Chief Complaint  Patient presents with  . Follow-up    2 month follow up    .   HPI Here to follow up regarding increased stress and high blood pressure.  Is concerned regarding rash - face.  Seeing dermatology.  Was started on fluorouracil.  Is concerned - not helping.  Tries to stay active.  Some issues with her left knee.  Due to see Dr Ernest Pine this week.  Using cane.  No chest pain.  Breathing stable.  No increased cough or congestion.  No abdominal pain.  Bowels moving.  Some nocturia.  Still with increased stress.  Lives alone, etc.  Discussed.     Past Medical History:  Diagnosis Date  . Allergic rhinitis   . Anemia   . Anxiety   . Cellulitis   . Cholelithiasis   . Essential hypertension   . History of stress test    a. 12/2008 Ex MV: EF 78%, no ischemia.  . Insomnia   . Osteoarthritis   . Tachycardia   . Valvular heart disease    a. 12/2008 Echo: EF 65%, mild MR;  b. 2/6 SEM RUSB - insignificant murmur, prob Ao Sclerosis.   Past Surgical History:  Procedure Laterality Date  . BACK SURGERY    . BUNIONECTOMY    . CATARACT EXTRACTION W/PHACO Right 05/18/2019   Procedure: CATARACT EXTRACTION PHACO AND INTRAOCULAR LENS PLACEMENT (IOC) RIGHT MALYUGIN  01:06.5  20.6%  13.72;  Surgeon: Lockie Mola, MD;  Location: [facility];  Service: Ophthalmology;  Laterality: Right;  please leave patient arrival 10  . CATARACT EXTRACTION W/PHACO Left 06/08/2019   Procedure: CATARACT EXTRACTION PHACO AND INTRAOCULAR LENS PLACEMENT (IOC) LEFT  00:51.0  20.3%  10.37;  Surgeon: Lockie Mola, MD;  Location: [facility];  Service: Ophthalmology;  Laterality: Left;  ARRIVAL 10:30 PLEASE LEAVE  . CHOLECYSTECTOMY  03/29/2007  . GALLBLADDER  SURGERY    . HIP SURGERY     bilateral   . REPLACEMENT TOTAL KNEE Right   . THUMB ARTHROSCOPY     Family History  Problem Relation Age of Onset  . Heart attack Father   . Stroke Father   . Heart disease Father   . CAD Father   . Heart attack Son 76       MI  . Hypertension Son   . Hypertension Mother   . Heart disease Mother   . Stroke Mother   . Breast cancer Neg Hx    Social History   Socioeconomic History  . Marital status: Widowed    Spouse name: Not on file  . Number of children: Not on file  . Years of education: Not on file  . Highest education level: Not on file  Occupational History  . Not on file  Tobacco Use  . Smoking status: Never  . Smokeless tobacco: Never  Vaping Use  . Vaping Use: Never used  Substance and Sexual Activity  . Alcohol use: Yes    Comment: 2 oz QOD  . Drug use: No  . Sexual activity: Not Currently  Other Topics Concern  . Not on file  Social History Narrative  . Not on file   Social Determinants  of Health   Financial Resource Strain: Low Risk  (10/04/2021)   Overall Financial Resource Strain (CARDIA)   . Difficulty of Paying Living Expenses: Not hard at all  Food Insecurity: No Food Insecurity (10/04/2021)   Hunger Vital Sign   . Worried About Programme researcher, broadcasting/film/video in the Last Year: Never true   . Ran Out of Food in the Last Year: Never true  Transportation Needs: No Transportation Needs (10/04/2021)   PRAPARE - Transportation   . Lack of Transportation (Medical): No   . Lack of Transportation (Non-Medical): No  Physical Activity: Insufficiently Active (10/04/2021)   Exercise Vital Sign   . Days of Exercise per Week: 4 days   . Minutes of Exercise per Session: 20 min  Stress: No Stress Concern Present (10/04/2021)   [employer] of Occupational Health - Occupational Stress Questionnaire   . Feeling of Stress : Not at all  Social Connections: Unknown (10/04/2021)   Social Connection and Isolation Panel [NHANES]   .  Frequency of Communication with Friends and Family: Not on file   . Frequency of Social Gatherings with Friends and Family: Not on file   . Attends Religious Services: Not on file   . Active Member of Clubs or Organizations: Not on file   . Attends Banker Meetings: Not on file   . Marital Status: Widowed     Review of Systems  Constitutional:  Negative for appetite change and unexpected weight change.  HENT:  Negative for congestion and sinus pressure.   Respiratory:  Negative for cough, chest tightness and shortness of breath.   Cardiovascular:  Negative for chest pain, palpitations and leg swelling.  Gastrointestinal:  Negative for abdominal pain, diarrhea, nausea and vomiting.  Genitourinary:  Negative for difficulty urinating and dysuria.  Musculoskeletal:  Negative for joint swelling and myalgias.  Skin:  Negative for color change and rash.  Neurological:  Negative for dizziness and headaches.  Psychiatric/Behavioral:         Some increased stress/anxiety aso utlined.         Objective:     BP 118/78 (BP Location: Left Arm, Patient Position: Sitting, Cuff Size: Normal)   Pulse 72   Temp 97.8 F (36.6 C) (Oral)   Ht 5' (1.524 m)   Wt 155 lb (70.3 kg)   SpO2 99%   BMI 30.27 kg/m  Wt Readings from Last 3 Encounters:  03/26/22 155 lb (70.3 kg)  01/08/22 154 lb 3.2 oz (69.9 kg)  10/04/21 153 lb (69.4 kg)    Physical Exam Vitals reviewed.  Constitutional:      General: She is not in acute distress.    Appearance: Normal appearance.  HENT:     Head: Normocephalic and atraumatic.     Right Ear: External ear normal.     Left Ear: External ear normal.  Eyes:     General: No scleral icterus.       Right eye: No discharge.        Left eye: No discharge.     Conjunctiva/sclera: Conjunctivae normal.  Neck:     Thyroid: No thyromegaly.  Cardiovascular:     Rate and Rhythm: Normal rate and regular rhythm.  Pulmonary:     Effort: No respiratory distress.      Breath sounds: Normal breath sounds. No wheezing.  Abdominal:     General: Bowel sounds are normal.     Palpations: Abdomen is soft.     Tenderness: There is no  abdominal tenderness.  Musculoskeletal:        General: No swelling or tenderness.     Cervical back: Neck supple. No tenderness.  Lymphadenopathy:     Cervical: No cervical adenopathy.  Skin:    Findings: No erythema or rash.  Neurological:     Mental Status: She is alert.  Psychiatric:        Mood and Affect: Mood normal.        Behavior: Behavior normal.      Outpatient Encounter Medications as of 03/26/2022  Medication Sig  . acetaminophen (TYLENOL) 500 MG tablet Take 1,000 mg by mouth every 8 (eight) hours as needed for mild pain or moderate pain.  Marland Kitchen ALPRAZolam (XANAX) 0.25 MG tablet Take 1 tablet (0.25 mg total) by mouth daily as needed for anxiety.  Marland Kitchen amLODipine (NORVASC) 2.5 MG tablet Take 1 tablet (2.5 mg total) by mouth daily.  Marland Kitchen aspirin 81 MG EC tablet Take 81 mg by mouth daily. Swallow whole.  Marland Kitchen atorvastatin (LIPITOR) 10 MG tablet Take 1 tablet (10 mg total) by mouth daily.  . cholecalciferol (VITAMIN D) 1000 UNITS tablet Take 1,000 Units by mouth every other day.   . cyanocobalamin 100 MCG tablet Take 100 mcg by mouth daily.  . sertraline (ZOLOFT) 100 MG tablet TAKE 1 TABLET BY MOUTH DAILY  . triamterene-hydrochlorothiazide (MAXZIDE-25) 37.5-25 MG tablet Take 1 tablet by mouth daily.   No facility-administered encounter medications on file as of 03/26/2022.     Lab Results  Component Value Date   WBC 10.4 10/16/2021   HGB 12.5 10/16/2021   HCT 39.2 10/16/2021   PLT 266.0 10/16/2021   GLUCOSE 87 01/06/2022   CHOL 139 01/06/2022   TRIG 86.0 01/06/2022   HDL 58.70 01/06/2022   LDLDIRECT 66.0 09/07/2018   LDLCALC 63 01/06/2022   ALT 14 01/06/2022   AST 13 01/06/2022   NA 141 01/06/2022   K 3.6 01/06/2022   CL 103 01/06/2022   CREATININE 1.10 01/06/2022   BUN 30 (H) 01/06/2022   CO2 30  01/06/2022   TSH 1.57 04/02/2021   INR 0.9 01/16/2009   HGBA1C 6.0 01/06/2022    MM DIAG BREAST TOMO BILATERAL  Result Date: 11/29/2021 CLINICAL DATA:  Patient complains of Whitehead on the left nipple EXAM: DIGITAL DIAGNOSTIC BILATERAL MAMMOGRAM WITH TOMOSYNTHESIS AND CAD exam TECHNIQUE: Bilateral digital diagnostic mammography and breast tomosynthesis was performed. The images were evaluated with computer-aided detection. COMPARISON:  Prior films ACR Breast Density Category b: There are scattered areas of fibroglandular density. FINDINGS: Cc and MLO views of bilateral breasts are submitted. No suspicious abnormalities identified bilaterally. Physicalexam: There is a 4 mm whitehead on the left nipple. IMPRESSION: Benign findings. RECOMMENDATION: Managed on clinical basis for patient's clinical complaint. I have discussed the findings and recommendations with the patient. If applicable, a reminder letter will be sent to the patient regarding the next appointment. BI-RADS CATEGORY  2: Benign. Electronically Signed   By: Abelardo Diesel M.D.   On: 11/29/2021 11:19      Assessment & Plan:   Problem List Items Addressed This Visit     Anxiety - Primary   Relevant Orders   EKG 12-Lead (Completed)     Einar Pheasant, MD

## 2022-04-06 NOTE — Assessment & Plan Note (Signed)
Continue atorvastatin and aspirin ?

## 2022-04-06 NOTE — Assessment & Plan Note (Signed)
Increased stress and anxiety as outlined.  On zoloft.  Discussed.  On zoloft - 150mg  q day.  Follow.

## 2022-04-13 ENCOUNTER — Other Ambulatory Visit: Payer: Self-pay | Admitting: Internal Medicine

## 2022-04-13 ENCOUNTER — Other Ambulatory Visit: Payer: Self-pay | Admitting: Cardiovascular Disease

## 2022-04-13 DIAGNOSIS — I10 Essential (primary) hypertension: Secondary | ICD-10-CM

## 2022-04-23 ENCOUNTER — Ambulatory Visit
Admission: RE | Admit: 2022-04-23 | Discharge: 2022-04-23 | Disposition: A | Discharge status: Home / Self Care | Payer: Medicare PPO | Source: Ambulatory Visit | Attending: Emergency Medicine | Admitting: Emergency Medicine

## 2022-04-23 ENCOUNTER — Telehealth: Payer: Self-pay | Admitting: Internal Medicine

## 2022-04-23 VITALS — BP 179/71 | HR 70 | Temp 98.7°F | Resp 16

## 2022-04-23 DIAGNOSIS — M25511 Pain in right shoulder: Secondary | ICD-10-CM | POA: Diagnosis not present

## 2022-04-23 MED ORDER — PREDNISONE 10 MG (21) PO TBPK: ORAL_TABLET | Freq: Every day | ORAL | 0 refills | Status: DC

## 2022-04-23 NOTE — ED Provider Notes (Signed)
Renaldo Fiddler    CSN: [id number] Arrival date & time: 04/23/22  1728      History   Chief Complaint Chief Complaint  Patient presents with   Neck Pain   Shoulder Pain   APPT 1745    HPI Alicia Mccann is a 88 y.o. female.   Patient presents with right-sided neck and right shoulder pain beginning 10 weeks.  Pain is worsened when raising arm above head.  Pain does not radiate and is described as a 8.  Range of motion is intact but pain can be felt when turning head towards left side.  Has attempted use of Biofreeze which has been somewhat helpful and lidocaine.  Past Medical History:  Diagnosis Date   Allergic rhinitis    Anemia    Anxiety    Cellulitis    Cholelithiasis    Essential hypertension    History of stress test    a. 12/2008 Ex MV: EF 78%, no ischemia.   Insomnia    Osteoarthritis    Tachycardia    Valvular heart disease    a. 12/2008 Echo: EF 65%, mild MR;  b. 2/6 SEM RUSB - insignificant murmur, prob Ao Sclerosis.    Patient Active Problem List   Diagnosis Date Noted   Rash 04/06/2022   Leukocytosis 09/17/2021   History of COVID-19 08/10/2021   Shortness of breath    Hypoxia    COVID-19 08/09/2021   Hypokalemia 08/09/2021   Hyponatremia 08/09/2021   Syncope 07/20/2021   Joint pain 04/07/2021   Unsteady gait 12/18/2019   Depression, recurrent (HCC) 07/31/2019   Memory changes 07/31/2019   Left knee pain 05/08/2019   Aortic atherosclerosis (HCC) 05/10/2018   Coronary artery calcification seen on CAT scan 05/10/2018   Decreased hearing 05/02/2018   History of total right knee replacement 01/21/2018   Chronic constipation    Noninfectious gastroenteritis    Lung nodule 01/17/2018   Anemia 01/14/2018   Hyperglycemia 01/09/2018   Colitis 01/09/2018   Right knee pain 11/28/2017   Light headedness 10/21/2017   Healthcare maintenance 06/25/2017   CKD (chronic kidney disease) stage 3, GFR 30-59 ml/min (HCC) 06/25/2017   Valvular heart  disease 03/03/2017   Malaise and fatigue 01/05/2017   PAD (peripheral artery disease) (HCC) 05/19/2016   Diverticulitis 05/25/2015   Abdominal pain 05/24/2015   Allergic rhinitis 04/30/2015   Absolute anemia 04/30/2015   Anxiety 04/30/2015   Carpal tunnel syndrome 04/30/2015   Chronic LBP 04/30/2015   Edema extremities 04/30/2015   Cannot sleep 04/30/2015   Arthritis sicca 04/30/2015   Seasonal affective disorder (HCC) 04/30/2015   Heart murmur 06/27/2013   Essential hypertension 06/27/2013   Osteoarthritis 06/27/2013   Mixed hyperlipidemia 06/27/2013   History of repair of hip joint 07/08/2012   Cholelithiasis without obstruction 03/22/2007    Past Surgical History:  Procedure Laterality Date   BACK SURGERY     BUNIONECTOMY     CATARACT EXTRACTION W/PHACO Right 05/18/2019   Procedure: CATARACT EXTRACTION PHACO AND INTRAOCULAR LENS PLACEMENT (IOC) RIGHT MALYUGIN  01:06.5  20.6%  13.72;  Surgeon: Lockie Mola, MD;  Location: [facility];  Service: Ophthalmology;  Laterality: Right;  please leave patient arrival 10   CATARACT EXTRACTION W/PHACO Left 06/08/2019   Procedure: CATARACT EXTRACTION PHACO AND INTRAOCULAR LENS PLACEMENT (IOC) LEFT  00:51.0  20.3%  10.37;  Surgeon: Lockie Mola, MD;  Location: [facility];  Service: Ophthalmology;  Laterality: Left;  ARRIVAL 10:30 PLEASE LEAVE  CHOLECYSTECTOMY  03/29/2007   GALLBLADDER SURGERY     HIP SURGERY     bilateral    REPLACEMENT TOTAL KNEE Right    THUMB ARTHROSCOPY      OB History   No obstetric history on file.      Home Medications    Prior to Admission medications   Medication Sig Start Date End Date Taking? Authorizing Provider  predniSONE (STERAPRED UNI-PAK 21 TAB) 10 MG (21) TBPK tablet Take by mouth daily. Take 6 tabs by mouth daily  for 1 days, then 5 tabs for 1 days, then 4 tabs for 1 days, then 3 tabs for 1 days, 2 tabs for 1 days, then 1 tab by mouth daily for 1 days 04/23/22   Yes Sheriden Archibeque R, NP  acetaminophen (TYLENOL) 500 MG tablet Take 1,000 mg by mouth every 8 (eight) hours as needed for mild pain or moderate pain.    [provider]  ALPRAZolam Prudy Feeler) 0.25 MG tablet Take 1 tablet (0.25 mg total) by mouth daily as needed for anxiety. 12/04/20   Dale V[place]a [place], MD  amLODipine (NORVASC) 2.5 MG tablet TAKE ONE TABLET BY MOUTH EVERY DAY 04/14/22   Antonieta Iba, MD  aspirin 81 MG EC tablet Take 81 mg by mouth daily. Swallow whole.    [provider]  atorvastatin (LIPITOR) 10 MG tablet TAKE ONE TABLET BY MOUTH EVERY DAY 04/14/22   Antonieta Iba, MD  cholecalciferol (VITAMIN D) 1000 UNITS tablet Take 1,000 Units by mouth every other day.     [provider]  cyanocobalamin 100 MCG tablet Take 100 mcg by mouth daily.    [provider]  sertraline (ZOLOFT) 100 MG tablet TAKE 1 TABLET BY MOUTH DAILY 04/13/22   Worthy Rancher B, FNP  triamterene-hydrochlorothiazide (MAXZIDE-25) 37.5-25 MG tablet TAKE ONE TABLET BY MOUTH EVERY DAY 04/14/22   Antonieta Iba, MD    Family History Family History  Problem Relation Age of Onset   Heart attack Father    Stroke Father    Heart disease Father    CAD Father    Heart attack Son 17       MI   Hypertension Son    Hypertension Mother    Heart disease Mother    Stroke Mother    Breast cancer Neg Hx     Social History Social History   Tobacco Use   Smoking status: Never   Smokeless tobacco: Never  Vaping Use   Vaping Use: Never used  Substance Use Topics   Alcohol use: Yes    Comment: 2 oz QOD   Drug use: No     Allergies   Clindamycin/lincomycin, Egg [place] (egg protein), Eggs or egg-derived products, Flagyl [metronidazole], Levaquin [levofloxacin], Mobic [meloxicam], Penicillins, and Sulfa antibiotics   Review of Systems Review of Systems  Constitutional: Negative.   Respiratory: Negative.    Cardiovascular: Negative.   Musculoskeletal:  Positive for  myalgias and neck pain. Negative for arthralgias, back pain, gait problem, joint swelling and neck stiffness.     Physical Exam Triage Vital Signs ED Triage Vitals [04/23/22 1809]  Enc Vitals Group     BP (!) 179/71     Pulse Rate 70     Resp 16     Temp 98.7 F (37.1 C)     Temp Source Oral     SpO2 98 %     Weight      Height      Head  Circumference      Peak Flow      Pain Score 5     Pain Loc      Pain Edu?      Excl. in [place]?    No data found.  Updated Vital Signs BP (!) 179/71 (BP Location: Left Arm)   Pulse 70   Temp 98.7 F (37.1 C) (Oral)   Resp 16   SpO2 98%   Visual Acuity Right Eye Distance:   Left Eye Distance:   Bilateral Distance:    Right Eye Near:   Left Eye Near:    Bilateral Near:     Physical Exam Constitutional:      Appearance: Normal appearance.  HENT:     Head: Normocephalic.  Eyes:     Extraocular Movements: Extraocular movements intact.  Pulmonary:     Effort: Pulmonary effort is normal.  Musculoskeletal:     Comments: Tenderness is present directly over the superior of the right shoulder joint, no ecchymosis, swelling or deformity, range of motion is intact, strength is a 4 out of 5, 2+ carotid and brachial pulse, no tenderness is present along the lateral aspect of the neck nor his cervical spinal tenderness present  Neurological:     Mental Status: She is alert and oriented to person, place, and time. Mental status is at baseline.  Psychiatric:        Mood and Affect: Mood normal.        Behavior: Behavior normal.      UC Treatments / Results  Labs (all labs ordered are listed, but only abnormal results are displayed) Labs Reviewed - No data to display  EKG   Radiology No results found.  Procedures Procedures (including critical care time)  Medications Ordered in UC Medications - No data to display  Initial Impression / Assessment and Plan / UC Course  I have reviewed the triage vital signs and the nursing  notes.  Pertinent labs & imaging results that were available during my care of the patient were reviewed by me and considered in my medical decision making (see chart for details).  Cute pain of right shoulder  Symptoms are most likely a flare of arthritis as patient endorses that years ago she used to receive intra-articular injections to the site but has not had this done in quite a while, low suspicion for neck involvement which patient had concerns with today, low suspicion for CVA, neurovascularly intact, discussed with patient, prescribed prednisone taper recommended continued topical medicine as well as ice or heat over the affected area, massage and stretching as tolerated, advised patient to follow-up with her orthopedic specialist for reevaluation for steroid injections Final Clinical Impressions(s) / UC Diagnoses   Final diagnoses:  Acute pain of right shoulder     Discharge Instructions      The pain that you are experiencing is directly over your shoulder joint and therefore I believe it is an arthritic flare especially since she used to receive steroid injections in this area in the past, I have a very low suspicion of a more serious cause of your discomfort such as a stroke or heart attack and most likely your headache is further related to the tension in your neck and shoulder area  You also have been grieving the loss of your dog after stress, stress can manifest in the body and will use and if you already have pain make your pain more intense  Starting tomorrow take prednisone every morning as  directed with food, this reduces the inflammation that occurs with arthritis which in turn will help with your pain  May continue topical bio reads as well as lidocaine patches if they have been somewhat helpful  You may place ice or heat over the affected area in 10 to 15-minute intervals  Please reach out to your orthopedic doctor to notify him of your shoulder pain for evaluation  for joint injection   ED Prescriptions     Medication Sig Dispense Auth. Provider   predniSONE (STERAPRED UNI-PAK 21 TAB) 10 MG (21) TBPK tablet Take by mouth daily. Take 6 tabs by mouth daily  for 1 days, then 5 tabs for 1 days, then 4 tabs for 1 days, then 3 tabs for 1 days, 2 tabs for 1 days, then 1 tab by mouth daily for 1 days 21 tablet Aristeo Hankerson, Elita Boone, NP      PDMP not reviewed this encounter.   Valinda Hoar, [place] 04/23/22 [phone]

## 2022-04-23 NOTE — Telephone Encounter (Signed)
I called and received a busy signal.  Arsenia Goracke,cma  

## 2022-04-23 NOTE — Telephone Encounter (Signed)
I called the patient back and she is scheduled to see a provider at the [facility] urgent care today.  Saben Donigan,cma

## 2022-04-23 NOTE — Discharge Instructions (Signed)
The pain that you are experiencing is directly over your shoulder joint and therefore I believe it is an arthritic flare especially since she used to receive steroid injections in this area in the past, I have a very low suspicion of a more serious cause of your discomfort such as a stroke or heart attack and most likely your headache is further related to the tension in your neck and shoulder area  You also have been grieving the loss of your dog after stress, stress can manifest in the body and will use and if you already have pain make your pain more intense  Starting tomorrow take prednisone every morning as directed with food, this reduces the inflammation that occurs with arthritis which in turn will help with your pain  May continue topical bio reads as well as lidocaine patches if they have been somewhat helpful  You may place ice or heat over the affected area in 10 to 15-minute intervals  Please reach out to your orthopedic doctor to notify him of your shoulder pain for evaluation for joint injection

## 2022-04-23 NOTE — Telephone Encounter (Signed)
With symptoms, needs to be evaluated.   

## 2022-04-23 NOTE — Telephone Encounter (Signed)
Pt called stating she is not feeling well. Pt has a headache and low fever. Pt stated she took a covid test yesterday and it was negative. Pt would like to be called

## 2022-04-23 NOTE — ED Triage Notes (Signed)
Patient c/o neck pain and RT shoulder pain x 10 weeks.   Denies fall or trauma.   History of Arthritis. History of steroid injections to shoulder.   Used BioFreeze with no relief of symtpoms.

## 2022-04-23 NOTE — Telephone Encounter (Signed)
I called and spoke with the patient and she stated she is achy all over especially in her shoulder area behind her ears. She does not feel goo/ she took her temp this morning and it was 100.7 she took a covid test 2 days ago and it was negative.  She also has a headache. Please advise.  Ramone Gander,cma

## 2022-05-20 ENCOUNTER — Other Ambulatory Visit (INDEPENDENT_AMBULATORY_CARE_PROVIDER_SITE_OTHER): Payer: Medicare PPO

## 2022-05-20 DIAGNOSIS — R739 Hyperglycemia, unspecified: Secondary | ICD-10-CM

## 2022-05-20 DIAGNOSIS — I10 Essential (primary) hypertension: Secondary | ICD-10-CM | POA: Diagnosis not present

## 2022-05-20 DIAGNOSIS — E782 Mixed hyperlipidemia: Secondary | ICD-10-CM | POA: Diagnosis not present

## 2022-05-20 LAB — HEPATIC FUNCTION PANEL
ALT: 14 U/L (ref 0–35)
AST: 16 U/L (ref 0–37)
Albumin: 4.2 g/dL (ref 3.5–5.2)
Alkaline Phosphatase: 78 U/L (ref 39–117)
Bilirubin, Direct: 0.1 mg/dL (ref 0.0–0.3)
Total Bilirubin: 0.6 mg/dL (ref 0.2–1.2)
Total Protein: 6.4 g/dL (ref 6.0–8.3)

## 2022-05-20 LAB — BASIC METABOLIC PANEL
BUN: 23 mg/dL (ref 6–23)
CO2: 30 mEq/L (ref 19–32)
Calcium: 10.1 mg/dL (ref 8.4–10.5)
Chloride: 102 mEq/L (ref 96–112)
Creatinine, Ser: 1.09 mg/dL (ref 0.40–1.20)
GFR: 45.85 mL/min — ABNORMAL LOW (ref >60.00)
Glucose, Bld: 91 mg/dL (ref 70–99)
Potassium: 3.4 mEq/L — ABNORMAL LOW (ref 3.5–5.1)
Sodium: 141 mEq/L (ref 135–145)

## 2022-05-20 LAB — LIPID PANEL
Cholesterol: 147 mg/dL (ref 0–200)
HDL: 50.8 mg/dL (ref >39.00)
LDL Cholesterol: 72 mg/dL (ref 0–99)
NonHDL: 95.81
Total CHOL/HDL Ratio: 3
Triglycerides: 121 mg/dL (ref 0.0–149.0)
VLDL: 24.2 mg/dL (ref 0.0–40.0)

## 2022-05-20 LAB — TSH: TSH: 3.74 u[IU]/mL (ref 0.35–5.50)

## 2022-05-20 LAB — HEMOGLOBIN A1C: Hgb A1c MFr Bld: 6.3 % (ref 4.6–6.5)

## 2022-05-22 ENCOUNTER — Other Ambulatory Visit: Payer: Self-pay

## 2022-05-22 ENCOUNTER — Other Ambulatory Visit: Payer: Medicare PPO

## 2022-05-22 DIAGNOSIS — E876 Hypokalemia: Secondary | ICD-10-CM

## 2022-05-22 DIAGNOSIS — I700 Atherosclerosis of aorta: Secondary | ICD-10-CM

## 2022-05-27 ENCOUNTER — Ambulatory Visit: Payer: Medicare PPO | Admitting: Internal Medicine

## 2022-05-27 ENCOUNTER — Encounter: Payer: Self-pay | Admitting: Internal Medicine

## 2022-05-27 VITALS — BP 120/60 | HR 67 | Temp 98.9°F | Ht 60.0 in | Wt 156.6 lb

## 2022-05-27 DIAGNOSIS — E782 Mixed hyperlipidemia: Secondary | ICD-10-CM

## 2022-05-27 DIAGNOSIS — I739 Peripheral vascular disease, unspecified: Secondary | ICD-10-CM | POA: Diagnosis not present

## 2022-05-27 DIAGNOSIS — M25512 Pain in left shoulder: Secondary | ICD-10-CM | POA: Diagnosis not present

## 2022-05-27 DIAGNOSIS — I10 Essential (primary) hypertension: Secondary | ICD-10-CM

## 2022-05-27 DIAGNOSIS — M25511 Pain in right shoulder: Secondary | ICD-10-CM

## 2022-05-27 DIAGNOSIS — R739 Hyperglycemia, unspecified: Secondary | ICD-10-CM | POA: Diagnosis not present

## 2022-05-27 DIAGNOSIS — F419 Anxiety disorder, unspecified: Secondary | ICD-10-CM

## 2022-05-27 DIAGNOSIS — I700 Atherosclerosis of aorta: Secondary | ICD-10-CM

## 2022-05-27 DIAGNOSIS — Z23 Encounter for immunization: Secondary | ICD-10-CM | POA: Diagnosis not present

## 2022-05-27 DIAGNOSIS — D649 Anemia, unspecified: Secondary | ICD-10-CM

## 2022-05-27 DIAGNOSIS — F339 Major depressive disorder, recurrent, unspecified: Secondary | ICD-10-CM

## 2022-05-27 DIAGNOSIS — N1831 Chronic kidney disease, stage 3a: Secondary | ICD-10-CM

## 2022-05-27 NOTE — Progress Notes (Signed)
Patient ID: Alicia Mccann, female   DOB: 03-13-1933, 88 y.o.   MRN: [id number]   Subjective:    Patient ID: Alicia Mccann, female    DOB: March 18, 1933, 88 y.o.   MRN: [id number]   Patient here for  Chief Complaint  Patient presents with   Follow-up    2 month follow up   .   HPI Here to follow up regarding increased stress, hypertension and hypercholesterolemia.  Still increased stress.  Discussed.  Using a cane to ambulate - at times.  No chest pain reported.  Breathing stable.  No increased cough or congestion.  No acid reflux reported.  No abdominal pain.  Bowels moving.  Some pain - neck / shoulder .     Past Medical History:  Diagnosis Date   Allergic rhinitis    Anemia    Anxiety    Cellulitis    Cholelithiasis    Essential hypertension    History of stress test    a. 12/2008 Ex MV: EF 78%, no ischemia.   Insomnia    Osteoarthritis    Tachycardia    Valvular heart disease    a. 12/2008 Echo: EF 65%, mild MR;  b. 2/6 SEM RUSB - insignificant murmur, prob Ao Sclerosis.   Past Surgical History:  Procedure Laterality Date   BACK SURGERY     BUNIONECTOMY     CATARACT EXTRACTION W/PHACO Right 05/18/2019   Procedure: CATARACT EXTRACTION PHACO AND INTRAOCULAR LENS PLACEMENT (IOC) RIGHT MALYUGIN  01:06.5  20.6%  13.72;  Surgeon: Leandrew Koyanagi, MD;  Location: [place];  Service: Ophthalmology;  Laterality: Right;  please leave patient arrival 10   CATARACT EXTRACTION W/PHACO Left 06/08/2019   Procedure: CATARACT EXTRACTION PHACO AND INTRAOCULAR LENS PLACEMENT (IOC) LEFT  00:51.0  20.3%  10.37;  Surgeon: Leandrew Koyanagi, MD;  Location: [place];  Service: Ophthalmology;  Laterality: Left;  ARRIVAL 10:30 PLEASE LEAVE   CHOLECYSTECTOMY  03/29/2007   GALLBLADDER SURGERY     HIP SURGERY     bilateral    REPLACEMENT TOTAL KNEE Right    THUMB ARTHROSCOPY     Family History  Problem Relation Age of Onset   Heart attack Father    Stroke  Father    Heart disease Father    CAD Father    Heart attack Son 25       MI   Hypertension Son    Hypertension Mother    Heart disease Mother    Stroke Mother    Breast cancer Neg Hx    Social History   Socioeconomic History   Marital status: Widowed    Spouse name: Not on file   Number of children: Not on file   Years of education: Not on file   Highest education level: Not on file  Occupational History   Not on file  Tobacco Use   Smoking status: Never   Smokeless tobacco: Never  Vaping Use   Vaping Use: Never used  Substance and Sexual Activity   Alcohol use: Yes    Comment: 2 oz QOD   Drug use: No   Sexual activity: Not Currently  Other Topics Concern   Not on file  Social History Narrative   Not on file   Social Determinants of Health   Financial Resource Strain: Low Risk  (10/04/2021)   Overall Financial Resource Strain (CARDIA)    Difficulty of Paying Living Expenses: Not hard at all  Food Insecurity: No Food Insecurity (  10/04/2021)   Hunger Vital Sign    Worried About Running Out of Food in the Last Year: Never true    Spr[place] in the Last Year: Never true  Transportation Needs: No Transportation Needs (10/04/2021)   PRAPARE - Hydrologist (Medical): No    Lack of Transportation (Non-Medical): No  Physical Activity: Insufficiently Active (10/04/2021)   Exercise Vital Sign    Days of Exercise per Week: 4 days    Minutes of Exercise per Session: 20 min  Stress: No Stress Concern Present (10/04/2021)   [place]    Feeling of Stress : Not at all  Social Connections: Unknown (10/04/2021)   Social Connection and Isolation Panel [NHANES]    Frequency of Communication with Friends and Family: Not on file    Frequency of Social Gatherings with Friends and Family: Not on file    Attends Religious Services: Not on file    Active Member of Clubs or Organizations:  Not on file    Attends Archivist Meetings: Not on file    Marital Status: Widowed     Review of Systems  Constitutional:  Negative for appetite change and unexpected weight change.  HENT:  Negative for congestion and sinus pressure.   Respiratory:  Negative for cough, chest tightness and shortness of breath.   Cardiovascular:  Negative for chest pain, palpitations and leg swelling.  Gastrointestinal:  Negative for abdominal pain, diarrhea, nausea and vomiting.  Genitourinary:  Negative for difficulty urinating and dysuria.  Musculoskeletal:        Shoulder and neck pain as outlined   Skin:  Negative for color change and rash.  Neurological:  Negative for dizziness.  Psychiatric/Behavioral:  Negative for agitation and dysphoric mood.        Increased stress.        Objective:     BP 120/60 (BP Location: Left Arm, Patient Position: Sitting, Cuff Size: Normal)   Pulse 67   Temp 98.9 F (37.2 C) (Oral)   Ht 5' (1.524 m)   Wt 156 lb 9.6 oz (71 kg)   SpO2 95%   BMI 30.58 kg/m  Wt Readings from Last 3 Encounters:  05/27/22 156 lb 9.6 oz (71 kg)  03/26/22 155 lb (70.3 kg)  01/08/22 154 lb 3.2 oz (69.9 kg)    Physical Exam Vitals reviewed.  Constitutional:      General: She is not in acute distress.    Appearance: Normal appearance.  HENT:     Head: Normocephalic and atraumatic.     Right Ear: External ear normal.     Left Ear: External ear normal.  Eyes:     General: No scleral icterus.       Right eye: No discharge.        Left eye: No discharge.     Conjunctiva/sclera: Conjunctivae normal.  Neck:     Thyroid: No thyromegaly.  Cardiovascular:     Rate and Rhythm: Normal rate and regular rhythm.  Pulmonary:     Effort: No respiratory distress.     Breath sounds: Normal breath sounds. No wheezing.  Abdominal:     General: Bowel sounds are normal.     Palpations: Abdomen is soft.     Tenderness: There is no abdominal tenderness.  Musculoskeletal:         General: No swelling or tenderness.     Cervical back: Neck supple. No  tenderness.  Lymphadenopathy:     Cervical: No cervical adenopathy.  Skin:    Findings: No erythema or rash.  Neurological:     Mental Status: She is alert.  Psychiatric:        Mood and Affect: Mood normal.        Behavior: Behavior normal.      Outpatient Encounter Medications as of 05/27/2022  Medication Sig   acetaminophen (TYLENOL) 500 MG tablet Take 1,000 mg by mouth every 8 (eight) hours as needed for mild pain or moderate pain.   ALPRAZolam (XANAX) 0.25 MG tablet Take 1 tablet (0.25 mg total) by mouth daily as needed for anxiety.   amLODipine (NORVASC) 2.5 MG tablet TAKE ONE TABLET BY MOUTH EVERY DAY   aspirin 81 MG EC tablet Take 81 mg by mouth daily. Swallow whole.   atorvastatin (LIPITOR) 10 MG tablet TAKE ONE TABLET BY MOUTH EVERY DAY   cholecalciferol (VITAMIN D) 1000 UNITS tablet Take 1,000 Units by mouth every other day.    cyanocobalamin 100 MCG tablet Take 100 mcg by mouth daily.   predniSONE (STERAPRED UNI-PAK 21 TAB) 10 MG (21) TBPK tablet Take by mouth daily. Take 6 tabs by mouth daily  for 1 days, then 5 tabs for 1 days, then 4 tabs for 1 days, then 3 tabs for 1 days, 2 tabs for 1 days, then 1 tab by mouth daily for 1 days   sertraline (ZOLOFT) 100 MG tablet TAKE 1 TABLET BY MOUTH DAILY   triamterene-hydrochlorothiazide (MAXZIDE-25) 37.5-25 MG tablet TAKE ONE TABLET BY MOUTH EVERY DAY   No facility-administered encounter medications on file as of 05/27/2022.     Lab Results  Component Value Date   WBC 11.8 (H) 05/27/2022   HGB 12.2 05/27/2022   HCT 37.1 05/27/2022   PLT 332.0 05/27/2022   GLUCOSE 86 05/27/2022   CHOL 147 05/20/2022   TRIG 121.0 05/20/2022   HDL 50.80 05/20/2022   LDLDIRECT 66.0 09/07/2018   LDLCALC 72 05/20/2022   ALT 14 05/20/2022   AST 16 05/20/2022   NA 139 05/27/2022   K 3.5 05/27/2022   CL 100 05/27/2022   CREATININE 1.20 05/27/2022   BUN 30 (H)  05/27/2022   CO2 28 05/27/2022   TSH 3.74 05/20/2022   INR 0.9 01/16/2009   HGBA1C 6.3 05/20/2022       Assessment & Plan:   Problem List Items Addressed This Visit     Anemia    Follow cbc.       Anxiety    Increased stress and anxiety as outlined.  On zoloft.  Discussed.  Notify me if feels needs further intervention.  Follow.        Aortic atherosclerosis (HCC)    Continue atorvastatin.       Bilateral shoulder pain    Describes bilateral neck and shoulder pain.  Given bilateral and pain distribution, will check esr and crp.  Consider PT and further w/up.  Check labs.  Follow.  Tylenol.       Relevant Orders   Sedimentation rate (Completed)   CBC with Differential/Platelet (Completed)   C-reactive protein (Completed)   Basic metabolic panel (Completed)   CKD (chronic kidney disease) stage 3, GFR 30-59 ml/min (HCC)    Continue to avoid antiinflammatories.  Stay hydrated.  Follow metabolic panel.       Depression, recurrent ([place])    Discussed.  On zoloft.  Continue.  Follow.        Essential hypertension -  Primary    Continue triam/hctz and amlodipine.  Follow pressures.  Follow metabolic panel.       Hyperglycemia    Low carb diet and exercise.  Follow met b and a1c.        Mixed hyperlipidemia    On crestor.  Low cholesterol diet and exercise.  Follow lipid panel and liver function tests.        PAD (peripheral artery disease) (HCC)    Continue atorvastatin and aspirin.        Other Visit Diagnoses     Need for immunization against influenza       Relevant Orders   Flu Vaccine QUAD High Dose(Fluad) (Completed)        Einar Pheasant, MD

## 2022-05-28 LAB — BASIC METABOLIC PANEL
BUN: 30 mg/dL — ABNORMAL HIGH (ref 6–23)
CO2: 28 mEq/L (ref 19–32)
Calcium: 10 mg/dL (ref 8.4–10.5)
Chloride: 100 mEq/L (ref 96–112)
Creatinine, Ser: 1.2 mg/dL (ref 0.40–1.20)
GFR: 40.85 mL/min — ABNORMAL LOW (ref >60.00)
Glucose, Bld: 86 mg/dL (ref 70–99)
Potassium: 3.5 mEq/L (ref 3.5–5.1)
Sodium: 139 mEq/L (ref 135–145)

## 2022-05-28 LAB — CBC WITH DIFFERENTIAL/PLATELET
Basophils Absolute: 0 10*3/uL (ref 0.0–0.1)
Basophils Relative: 0.3 % (ref 0.0–3.0)
Eosinophils Absolute: 0.3 10*3/uL (ref 0.0–0.7)
Eosinophils Relative: 2.5 % (ref 0.0–5.0)
HCT: 37.1 % (ref 36.0–46.0)
Hemoglobin: 12.2 g/dL (ref 12.0–15.0)
Lymphocytes Relative: 24.4 % (ref 12.0–46.0)
Lymphs Abs: 2.9 10*3/uL (ref 0.7–4.0)
MCHC: 33 g/dL (ref 30.0–36.0)
MCV: 88.3 fl (ref 78.0–100.0)
Monocytes Absolute: 0.7 10*3/uL (ref 0.1–1.0)
Monocytes Relative: 6.2 % (ref 3.0–12.0)
Neutro Abs: 7.9 10*3/uL — ABNORMAL HIGH (ref 1.4–7.7)
Neutrophils Relative %: 66.6 % (ref 43.0–77.0)
Platelets: 332 10*3/uL (ref 150.0–400.0)
RBC: 4.2 Mil/uL (ref 3.87–5.11)
RDW: 13.6 % (ref 11.5–15.5)
WBC: 11.8 10*3/uL — ABNORMAL HIGH (ref 4.0–10.5)

## 2022-05-28 LAB — SEDIMENTATION RATE: Sed Rate: 16 mm/hr (ref 0–30)

## 2022-05-28 LAB — C-REACTIVE PROTEIN: CRP: 3.9 mg/dL (ref 0.5–20.0)

## 2022-05-29 ENCOUNTER — Other Ambulatory Visit: Payer: Self-pay

## 2022-05-29 DIAGNOSIS — D72829 Elevated white blood cell count, unspecified: Secondary | ICD-10-CM

## 2022-06-01 ENCOUNTER — Encounter: Payer: Self-pay | Admitting: Internal Medicine

## 2022-06-01 NOTE — Assessment & Plan Note (Signed)
Continue triam/hctz and amlodipine.  Follow pressures.  Follow metabolic panel.  

## 2022-06-01 NOTE — Assessment & Plan Note (Signed)
On crestor.  Low cholesterol diet and exercise.  Follow lipid panel and liver function tests.   

## 2022-06-01 NOTE — Assessment & Plan Note (Signed)
Continue atorvastatin

## 2022-06-01 NOTE — Assessment & Plan Note (Signed)
Continue to avoid antiinflammatories.  Stay hydrated.  Follow metabolic panel.  

## 2022-06-01 NOTE — Assessment & Plan Note (Signed)
Discussed.  On zoloft.  Continue.  Follow.

## 2022-06-01 NOTE — Assessment & Plan Note (Signed)
Describes bilateral neck and shoulder pain.  Given bilateral and pain distribution, will check esr and crp.  Consider PT and further w/up.  Check labs.  Follow.  Tylenol.  

## 2022-06-01 NOTE — Assessment & Plan Note (Signed)
Low carb diet and exercise.  Follow met b and a1c.   

## 2022-06-01 NOTE — Assessment & Plan Note (Signed)
Increased stress and anxiety as outlined.  On zoloft.  Discussed.  Notify me if feels needs further intervention.  Follow.

## 2022-06-01 NOTE — Assessment & Plan Note (Signed)
Follow cbc.  

## 2022-06-01 NOTE — Assessment & Plan Note (Signed)
Continue atorvastatin and aspirin ?

## 2022-06-09 ENCOUNTER — Other Ambulatory Visit: Payer: Medicare PPO

## 2022-06-16 ENCOUNTER — Other Ambulatory Visit: Payer: Medicare PPO

## 2022-06-16 ENCOUNTER — Other Ambulatory Visit: Payer: Self-pay | Admitting: Family

## 2022-06-23 ENCOUNTER — Other Ambulatory Visit: Payer: Self-pay | Admitting: Internal Medicine

## 2022-06-24 NOTE — Telephone Encounter (Signed)
Has not had this refilled since 11/2020.  I do not mind refilling, but please call and confirm she requested and confirm doing ok.  Also, it appears she did not schedule her 8 week f/u appt at her last visit.  Needs f/u appt scheduled.

## 2022-06-25 ENCOUNTER — Telehealth: Payer: Self-pay

## 2022-06-25 NOTE — Telephone Encounter (Signed)
See phone message

## 2022-06-25 NOTE — Telephone Encounter (Signed)
Please call and confirm doing ok.  I can refill, but want to confirm doing ok.

## 2022-06-25 NOTE — Telephone Encounter (Signed)
Pt did make request, stated is having anxiety regarding falling. Stated she is very nervous about falling and it works her up. Asked if we could refill xanax incase she gets too worked up.  Has appt 11/14 to discuss other "old age" issues with you.

## 2022-06-25 NOTE — Telephone Encounter (Signed)
Pt requesting RF for her ALPRAZolam (XANAX) 0.25 MG tablet [facility]   Last RF:12/04/20 Last OV:05/27/22 Next OV:08/04/22

## 2022-06-26 MED ORDER — ALPRAZOLAM 0.25 MG PO TABS: 0.2500 mg | ORAL_TABLET | Freq: Every day | ORAL | 0 refills | Status: DC | PRN

## 2022-06-26 NOTE — Telephone Encounter (Signed)
Rx ok'd for xanax #30 with no refills.  ?

## 2022-06-26 NOTE — Telephone Encounter (Signed)
Duplicate. Rx already sent in.  See phone note.

## 2022-06-26 NOTE — Addendum Note (Signed)
Addended by: Charm Barges on: 06/26/2022 04:15 AM   Modules accepted: Orders

## 2022-07-01 ENCOUNTER — Ambulatory Visit: Payer: Medicare PPO | Admitting: Internal Medicine

## 2022-07-01 VITALS — BP 140/62 | HR 86 | Temp 97.9°F | Resp 16 | Ht 60.0 in | Wt 149.8 lb

## 2022-07-01 DIAGNOSIS — F419 Anxiety disorder, unspecified: Secondary | ICD-10-CM | POA: Diagnosis not present

## 2022-07-01 DIAGNOSIS — N1831 Chronic kidney disease, stage 3a: Secondary | ICD-10-CM | POA: Diagnosis not present

## 2022-07-01 DIAGNOSIS — D72829 Elevated white blood cell count, unspecified: Secondary | ICD-10-CM | POA: Diagnosis not present

## 2022-07-01 DIAGNOSIS — R300 Dysuria: Secondary | ICD-10-CM

## 2022-07-01 DIAGNOSIS — R5381 Other malaise: Secondary | ICD-10-CM

## 2022-07-01 DIAGNOSIS — I739 Peripheral vascular disease, unspecified: Secondary | ICD-10-CM

## 2022-07-01 DIAGNOSIS — R739 Hyperglycemia, unspecified: Secondary | ICD-10-CM

## 2022-07-01 DIAGNOSIS — R944 Abnormal results of kidney function studies: Secondary | ICD-10-CM

## 2022-07-01 DIAGNOSIS — I10 Essential (primary) hypertension: Secondary | ICD-10-CM

## 2022-07-01 DIAGNOSIS — R351 Nocturia: Secondary | ICD-10-CM

## 2022-07-01 DIAGNOSIS — I700 Atherosclerosis of aorta: Secondary | ICD-10-CM

## 2022-07-01 DIAGNOSIS — F339 Major depressive disorder, recurrent, unspecified: Secondary | ICD-10-CM

## 2022-07-01 DIAGNOSIS — R5383 Other fatigue: Secondary | ICD-10-CM

## 2022-07-01 DIAGNOSIS — E782 Mixed hyperlipidemia: Secondary | ICD-10-CM

## 2022-07-01 NOTE — Progress Notes (Signed)
Patient ID: Alicia Mccann, female   DOB: May 09, 1933, 88 y.o.   MRN: [id number]   Subjective:    Patient ID: Alicia Mccann, female    DOB: 1933-01-30, 88 y.o.   MRN: [id number]   Patient here for  Chief Complaint  Patient presents with   Follow-up   Anxiety   .   HPI Here to follow up regarding increased stress/anxiety.  On zoloft.  Has a new dog.  This appears to have helped.  Increased joint pain - describes pain neck/shoulders.  Saw ortho and was seen at urgent care.  Felt to be related to arthritis.  States joints bother her from her neck to her knees. Recent CRP and ESR wnl. Discussed f/u with ortho/rheumatology.  No chest pain.  Breathing stable.  No increased cough or congestion. No abdominal pain.  Some nocturia - 3x.  Not sleeping well.  Reports fatigue.     Past Medical History:  Diagnosis Date   Allergic rhinitis    Anemia    Anxiety    Cellulitis    Cholelithiasis    Essential hypertension    History of stress test    a. 12/2008 Ex MV: EF 78%, no ischemia.   Insomnia    Osteoarthritis    Tachycardia    Valvular heart disease    a. 12/2008 Echo: EF 65%, mild MR;  b. 2/6 SEM RUSB - insignificant murmur, prob Ao Sclerosis.   Past Surgical History:  Procedure Laterality Date   BACK SURGERY     BUNIONECTOMY     CATARACT EXTRACTION W/PHACO Right 05/18/2019   Procedure: CATARACT EXTRACTION PHACO AND INTRAOCULAR LENS PLACEMENT (IOC) RIGHT MALYUGIN  01:06.5  20.6%  13.72;  Surgeon: Leandrew Koyanagi, MD;  Location: [place];  Service: Ophthalmology;  Laterality: Right;  please leave patient arrival 10   CATARACT EXTRACTION W/PHACO Left 06/08/2019   Procedure: CATARACT EXTRACTION PHACO AND INTRAOCULAR LENS PLACEMENT (IOC) LEFT  00:51.0  20.3%  10.37;  Surgeon: Leandrew Koyanagi, MD;  Location: [place];  Service: Ophthalmology;  Laterality: Left;  ARRIVAL 10:30 PLEASE LEAVE   CHOLECYSTECTOMY  03/29/2007   GALLBLADDER SURGERY     HIP SURGERY      bilateral    REPLACEMENT TOTAL KNEE Right    THUMB ARTHROSCOPY     Family History  Problem Relation Age of Onset   Heart attack Father    Stroke Father    Heart disease Father    CAD Father    Heart attack Son 35       MI   Hypertension Son    Hypertension Mother    Heart disease Mother    Stroke Mother    Breast cancer Neg Hx    Social History   Socioeconomic History   Marital status: Widowed    Spouse name: Not on file   Number of children: Not on file   Years of education: Not on file   Highest education level: Not on file  Occupational History   Not on file  Tobacco Use   Smoking status: Never   Smokeless tobacco: Never  Vaping Use   Vaping Use: Never used  Substance and Sexual Activity   Alcohol use: Yes    Comment: 2 oz QOD   Drug use: No   Sexual activity: Not Currently  Other Topics Concern   Not on file  Social History Narrative   Not on file   Social Determinants of Health   Financial Resource  Strain: Low Risk  (10/04/2021)   Overall Financial Resource Strain (CARDIA)    Difficulty of Paying Living Expenses: Not hard at all  Food Insecurity: No Food Insecurity (10/04/2021)   Hunger Vital Sign    Worried About Running Out of Food in the Last Year: Never true    Ran Out of Food in the Last Year: Never true  Transportation Needs: No Transportation Needs (10/04/2021)   PRAPARE - Hydrologist (Medical): No    Lack of Transportation (Non-Medical): No  Physical Activity: Insufficiently Active (10/04/2021)   Exercise Vital Sign    Days of Exercise per Week: 4 days    Minutes of Exercise per Session: 20 min  Stress: No Stress Concern Present (10/04/2021)   [place]    Feeling of Stress : Not at all  Social Connections: Unknown (10/04/2021)   Social Connection and Isolation Panel [NHANES]    Frequency of Communication with Friends and Family: Not on file     Frequency of Social Gatherings with Friends and Family: Not on file    Attends Religious Services: Not on file    Active Member of Clubs or Organizations: Not on file    Attends Archivist Meetings: Not on file    Marital Status: Widowed     Review of Systems  Constitutional:  Positive for fatigue. Negative for appetite change and unexpected weight change.  HENT:  Negative for congestion and sinus pressure.   Respiratory:  Negative for cough, chest tightness and shortness of breath.   Cardiovascular:  Negative for chest pain, palpitations and leg swelling.  Gastrointestinal:  Negative for abdominal pain, diarrhea, nausea and vomiting.  Genitourinary:  Negative for difficulty urinating and dysuria.  Musculoskeletal:        Neck/shoulder pain.  Joint pains as outlined.   Skin:  Negative for color change and rash.  Neurological:  Negative for dizziness, light-headedness and headaches.  Psychiatric/Behavioral:  Negative for agitation and dysphoric mood.        Objective:     BP (!) 140/62 (BP Location: Left Arm, Patient Position: Sitting, Cuff Size: Small)   Pulse 86   Temp 97.9 F (36.6 C) (Temporal)   Resp 16   Ht 5' (1.524 m)   Wt 149 lb 12.8 oz (67.9 kg)   SpO2 97%   BMI 29.26 kg/m  Wt Readings from Last 3 Encounters:  07/01/22 149 lb 12.8 oz (67.9 kg)  05/27/22 156 lb 9.6 oz (71 kg)  03/26/22 155 lb (70.3 kg)    Physical Exam Vitals reviewed.  Constitutional:      General: She is not in acute distress.    Appearance: Normal appearance.  HENT:     Head: Normocephalic and atraumatic.     Right Ear: External ear normal.     Left Ear: External ear normal.  Eyes:     General: No scleral icterus.       Right eye: No discharge.        Left eye: No discharge.     Conjunctiva/sclera: Conjunctivae normal.  Neck:     Thyroid: No thyromegaly.  Cardiovascular:     Rate and Rhythm: Normal rate and regular rhythm.  Pulmonary:     Effort: No respiratory  distress.     Breath sounds: Normal breath sounds. No wheezing.  Abdominal:     General: Bowel sounds are normal.     Palpations: Abdomen is soft.  Tenderness: There is no abdominal tenderness.  Musculoskeletal:        General: No swelling or tenderness.     Cervical back: Neck supple. No tenderness.  Lymphadenopathy:     Cervical: No cervical adenopathy.  Skin:    Findings: No erythema or rash.  Neurological:     Mental Status: She is alert.  Psychiatric:        Mood and Affect: Mood normal.        Behavior: Behavior normal.      Outpatient Encounter Medications as of 07/01/2022  Medication Sig   acetaminophen (TYLENOL) 500 MG tablet Take 1,000 mg by mouth every 8 (eight) hours as needed for mild pain or moderate pain.   ALPRAZolam (XANAX) 0.25 MG tablet Take 1 tablet (0.25 mg total) by mouth daily as needed for anxiety.   amLODipine (NORVASC) 2.5 MG tablet TAKE ONE TABLET BY MOUTH EVERY DAY   aspirin 81 MG EC tablet Take 81 mg by mouth daily. Swallow whole.   atorvastatin (LIPITOR) 10 MG tablet TAKE ONE TABLET BY MOUTH EVERY DAY   cholecalciferol (VITAMIN D) 1000 UNITS tablet Take 1,000 Units by mouth every other day.    cyanocobalamin 100 MCG tablet Take 100 mcg by mouth daily.   sertraline (ZOLOFT) 100 MG tablet TAKE 1 TABLET BY MOUTH DAILY   triamterene-hydrochlorothiazide (MAXZIDE-25) 37.5-25 MG tablet TAKE ONE TABLET BY MOUTH EVERY DAY   [DISCONTINUED] predniSONE (STERAPRED UNI-PAK 21 TAB) 10 MG (21) TBPK tablet Take by mouth daily. Take 6 tabs by mouth daily  for 1 days, then 5 tabs for 1 days, then 4 tabs for 1 days, then 3 tabs for 1 days, 2 tabs for 1 days, then 1 tab by mouth daily for 1 days   No facility-administered encounter medications on file as of 07/01/2022.     Lab Results  Component Value Date   WBC 12.0 (H) 07/01/2022   HGB 12.8 07/01/2022   HCT 38.8 07/01/2022   PLT 360.0 07/01/2022   GLUCOSE 83 07/01/2022   CHOL 147 05/20/2022   TRIG 121.0  05/20/2022   HDL 50.80 05/20/2022   LDLDIRECT 66.0 09/07/2018   LDLCALC 72 05/20/2022   ALT 14 05/20/2022   AST 16 05/20/2022   NA 139 07/01/2022   K 3.6 07/01/2022   CL 100 07/01/2022   CREATININE 1.14 07/01/2022   BUN 26 (H) 07/01/2022   CO2 27 07/01/2022   TSH 3.74 05/20/2022   INR 0.9 01/16/2009   HGBA1C 6.3 05/20/2022       Assessment & Plan:   Problem List Items Addressed This Visit     Anxiety    Increased stress and anxiety as outlined.  On zoloft.  Discussed.  Notify me if feels needs further intervention.  Follow.        Aortic atherosclerosis (HCC)    Continue atorvastatin.       CKD (chronic kidney disease) stage 3, GFR 30-59 ml/min (HCC)    Continue to avoid antiinflammatories.  Stay hydrated.  Follow metabolic panel.       Depression, recurrent ([place])    Discussed.  On zoloft.  Continue.  Follow.        Dysuria    Check urine to confirm no infection.       Relevant Orders   Urine Culture (Completed)   Urinalysis, Routine w reflex microscopic   Essential hypertension    Continue triam/hctz and amlodipine.  Follow pressures.  Follow metabolic panel.  Hyperglycemia    Low carb diet and exercise.  Follow met b and a1c.        Leukocytosis - Primary    Recheck cbc.  Recent wbc count elevated.        Relevant Orders   CBC w/Diff (Completed)   Malaise and fatigue    Some fatigue.  Not sleeping well and increased nocturia.  Discussed possible sleep apnea.  Agreeable for evaluation.  Refer to pulmonary for further evaluation.       Relevant Orders   Ambulatory referral to Pulmonology   Mixed hyperlipidemia    On lipitor.  Low cholesterol diet and exercise.  Follow lipid panel and liver function tests.        Nocturia    Some fatigue.  Not sleeping well and increased nocturia.  Discussed possible sleep apnea.  Agreeable for evaluation.  Refer to pulmonary for further evaluation.       Relevant Orders   Ambulatory referral to  Pulmonology   PAD (peripheral artery disease) ([place])    Continue atorvastatin and aspirin.        Other Visit Diagnoses     Decreased GFR       Relevant Orders   Basic Metabolic Panel (BMET) (Completed)        Einar Pheasant, MD

## 2022-07-02 LAB — CBC WITH DIFFERENTIAL/PLATELET
Basophils Absolute: 0.1 10*3/uL (ref 0.0–0.1)
Basophils Relative: 0.7 % (ref 0.0–3.0)
Eosinophils Absolute: 0.3 10*3/uL (ref 0.0–0.7)
Eosinophils Relative: 2.3 % (ref 0.0–5.0)
HCT: 38.8 % (ref 36.0–46.0)
Hemoglobin: 12.8 g/dL (ref 12.0–15.0)
Lymphocytes Relative: 22.4 % (ref 12.0–46.0)
Lymphs Abs: 2.7 10*3/uL (ref 0.7–4.0)
MCHC: 32.9 g/dL (ref 30.0–36.0)
MCV: 87.3 fl (ref 78.0–100.0)
Monocytes Absolute: 0.7 10*3/uL (ref 0.1–1.0)
Monocytes Relative: 6.1 % (ref 3.0–12.0)
Neutro Abs: 8.2 10*3/uL — ABNORMAL HIGH (ref 1.4–7.7)
Neutrophils Relative %: 68.5 % (ref 43.0–77.0)
Platelets: 360 10*3/uL (ref 150.0–400.0)
RBC: 4.45 Mil/uL (ref 3.87–5.11)
RDW: 13.9 % (ref 11.5–15.5)
WBC: 12 10*3/uL — ABNORMAL HIGH (ref 4.0–10.5)

## 2022-07-02 LAB — BASIC METABOLIC PANEL
BUN: 26 mg/dL — ABNORMAL HIGH (ref 6–23)
CO2: 27 mEq/L (ref 19–32)
Calcium: 10.1 mg/dL (ref 8.4–10.5)
Chloride: 100 mEq/L (ref 96–112)
Creatinine, Ser: 1.14 mg/dL (ref 0.40–1.20)
GFR: 43.41 mL/min — ABNORMAL LOW (ref >60.00)
Glucose, Bld: 83 mg/dL (ref 70–99)
Potassium: 3.6 mEq/L (ref 3.5–5.1)
Sodium: 139 mEq/L (ref 135–145)

## 2022-07-02 LAB — URINE CULTURE
MICRO NUMBER:: 14186803
SPECIMEN QUALITY:: ADEQUATE

## 2022-07-03 ENCOUNTER — Other Ambulatory Visit: Payer: Self-pay

## 2022-07-03 ENCOUNTER — Telehealth: Payer: Self-pay

## 2022-07-04 NOTE — Telephone Encounter (Signed)
ERROR

## 2022-07-07 ENCOUNTER — Other Ambulatory Visit: Payer: Self-pay

## 2022-07-07 ENCOUNTER — Telehealth: Payer: Self-pay

## 2022-07-07 DIAGNOSIS — M159 Polyosteoarthritis, unspecified: Secondary | ICD-10-CM

## 2022-07-07 NOTE — Telephone Encounter (Signed)
Referral placed to [facility] ortho

## 2022-07-07 NOTE — Telephone Encounter (Signed)
Pt called asking to see if Dr.Scott was able to sch appt for her to see ortho @[facility] . Informed pt that I did not see a referral that was placed for her to see ortho. Pt requesting callback.... Marland Kitchen

## 2022-07-08 ENCOUNTER — Encounter: Payer: Self-pay | Admitting: Internal Medicine

## 2022-07-08 DIAGNOSIS — R351 Nocturia: Secondary | ICD-10-CM | POA: Insufficient documentation

## 2022-07-08 NOTE — Assessment & Plan Note (Signed)
Discussed.  On zoloft.  Continue.  Follow.

## 2022-07-08 NOTE — Assessment & Plan Note (Signed)
Continue atorvastatin

## 2022-07-08 NOTE — Assessment & Plan Note (Addendum)
Some fatigue.  Not sleeping well and increased nocturia.  Discussed possible sleep apnea.  Agreeable for evaluation.  Refer to pulmonary for further evaluation.

## 2022-07-08 NOTE — Assessment & Plan Note (Signed)
Continue triam/hctz and amlodipine.  Follow pressures.  Follow metabolic panel.  

## 2022-07-08 NOTE — Assessment & Plan Note (Signed)
Low carb diet and exercise.  Follow met b and a1c.   

## 2022-07-08 NOTE — Assessment & Plan Note (Signed)
Some fatigue.  Not sleeping well and increased nocturia.  Discussed possible sleep apnea.  Agreeable for evaluation.  Refer to pulmonary for further evaluation.  

## 2022-07-08 NOTE — Assessment & Plan Note (Signed)
Recheck cbc.  Recent wbc count elevated.

## 2022-07-08 NOTE — Assessment & Plan Note (Signed)
Continue atorvastatin and aspirin ?

## 2022-07-08 NOTE — Assessment & Plan Note (Signed)
On lipitor.  Low cholesterol diet and exercise.  Follow lipid panel and liver function tests.   

## 2022-07-08 NOTE — Assessment & Plan Note (Signed)
Check urine to confirm no infection.  

## 2022-07-08 NOTE — Assessment & Plan Note (Signed)
Continue to avoid antiinflammatories.  Stay hydrated.  Follow metabolic panel.  

## 2022-07-08 NOTE — Assessment & Plan Note (Signed)
Increased stress and anxiety as outlined.  On zoloft.  Discussed.  Notify me if feels needs further intervention.  Follow.

## 2022-07-13 ENCOUNTER — Telehealth: Payer: Self-pay | Admitting: Internal Medicine

## 2022-07-13 DIAGNOSIS — M2550 Pain in unspecified joint: Secondary | ICD-10-CM

## 2022-07-13 NOTE — Telephone Encounter (Signed)
Pt request referral to rheumatology.  Please cancel the referral to ortho.  Let me know if I need to do anything more.

## 2022-07-17 ENCOUNTER — Telehealth: Payer: Self-pay

## 2022-07-17 NOTE — Telephone Encounter (Signed)
Patient states she received the covid vaccine on 05/30/2022.  Patient states in order for her insurance to pay for it, she has to get a referral from her doctor.  Patient states the referral would need to go to [facility], [address] [place], [place], [place] [address].  Phone:  [phone].  Patient states she spoke with Morrie Sheldon [phone].  Patient states this will cost her $200 if her insurance won't cover it.

## 2022-07-18 ENCOUNTER — Other Ambulatory Visit: Payer: Self-pay

## 2022-07-18 DIAGNOSIS — R52 Pain, unspecified: Secondary | ICD-10-CM

## 2022-07-18 NOTE — Telephone Encounter (Signed)
Need more information. I am not sure what is needed.  Have not been asked to do a referral for covid vaccine previously and I am not sure what is needed.

## 2022-07-18 NOTE — Telephone Encounter (Signed)
Ordered again with corrections and faxed to [facility].  See previous note.  Jacelyn Cuen,cma

## 2022-07-18 NOTE — Telephone Encounter (Signed)
Ok. Please reorder using symptoms.

## 2022-07-18 NOTE — Telephone Encounter (Signed)
Order done and faxed to [facility].  Confirmation given.Navi Ewton,cma

## 2022-07-18 NOTE — Addendum Note (Signed)
Addended by: Charlyne Mom D on: 07/18/2022 03:17 PM   Modules accepted: Orders

## 2022-07-28 ENCOUNTER — Telehealth: Payer: Self-pay | Admitting: Internal Medicine

## 2022-07-28 ENCOUNTER — Other Ambulatory Visit: Payer: Self-pay | Admitting: Internal Medicine

## 2022-07-28 NOTE — Telephone Encounter (Signed)
Zoloft sent - alprazolam pended for dr [employer]

## 2022-07-28 NOTE — Telephone Encounter (Signed)
Patient called is requesting a refill for her sertraline (ZOLOFT) 100 MG tablet. Patient is out of her  medication.

## 2022-07-29 MED ORDER — SERTRALINE HCL 100 MG PO TABS: 100.0000 mg | ORAL_TABLET | Freq: Every day | ORAL | 1 refills | Status: DC

## 2022-07-29 NOTE — Telephone Encounter (Signed)
Rx ok'd for zoloft and xanax.

## 2022-08-01 DIAGNOSIS — M542 Cervicalgia: Secondary | ICD-10-CM | POA: Diagnosis not present

## 2022-08-01 DIAGNOSIS — M4312 Spondylolisthesis, cervical region: Secondary | ICD-10-CM | POA: Diagnosis not present

## 2022-08-04 ENCOUNTER — Institutional Professional Consult (permissible substitution): Payer: Medicare PPO | Admitting: Internal Medicine

## 2022-08-04 ENCOUNTER — Ambulatory Visit: Payer: Medicare PPO | Admitting: Internal Medicine

## 2022-08-21 IMAGING — CR DG CHEST 2V
1 series · 2 of 2 positions shown · non-contrast
Comparison: 01/09/2018 and CT chest 03/18/2019.

CLINICAL DATA: Shortness of breath and hypoxia.  COVID positive.

EXAM:
CHEST - 2 VIEW

[Series 1: dg chest 2 view · 0.14mm/px · 2 of 2 slices shown]
[im 1/2]
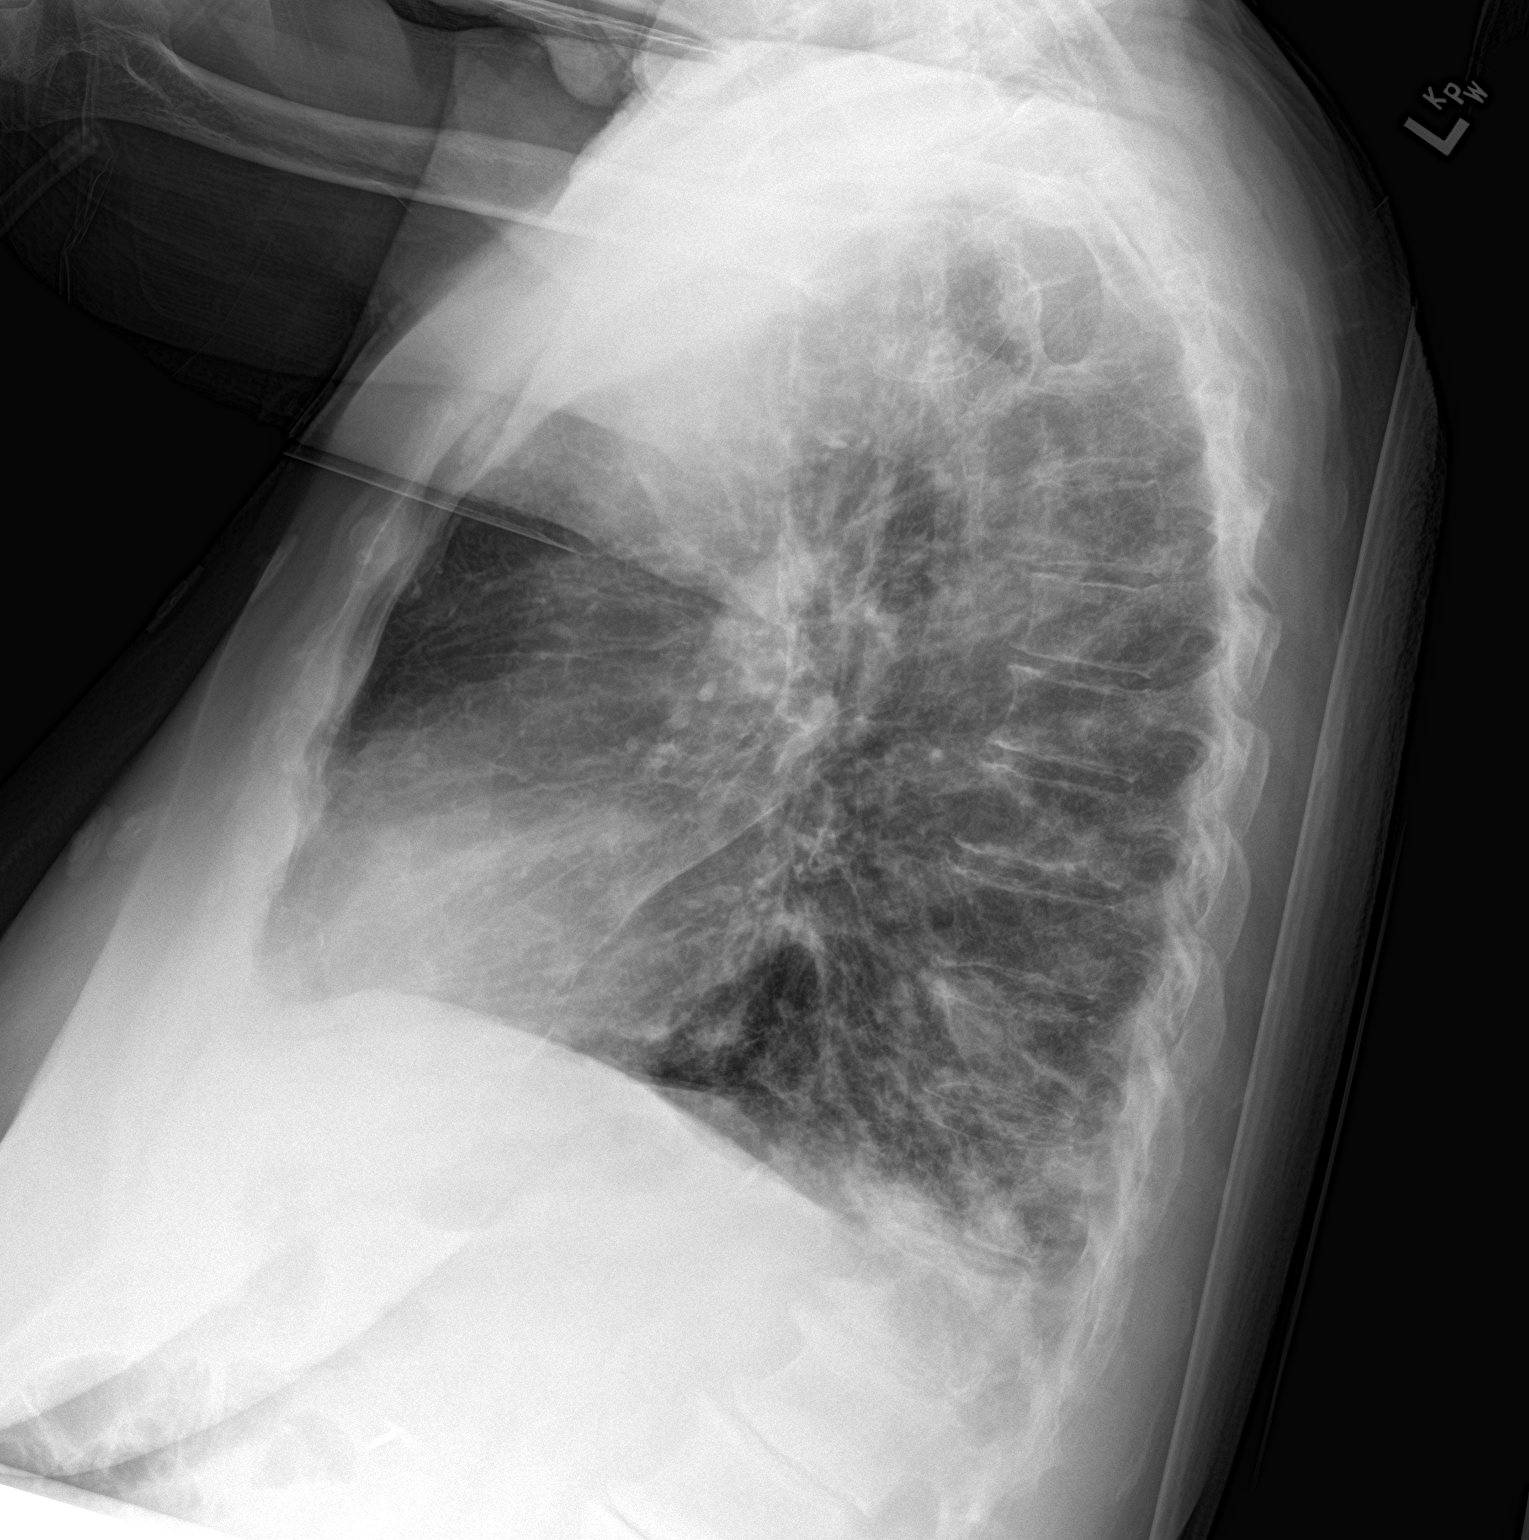
[im 2/2]
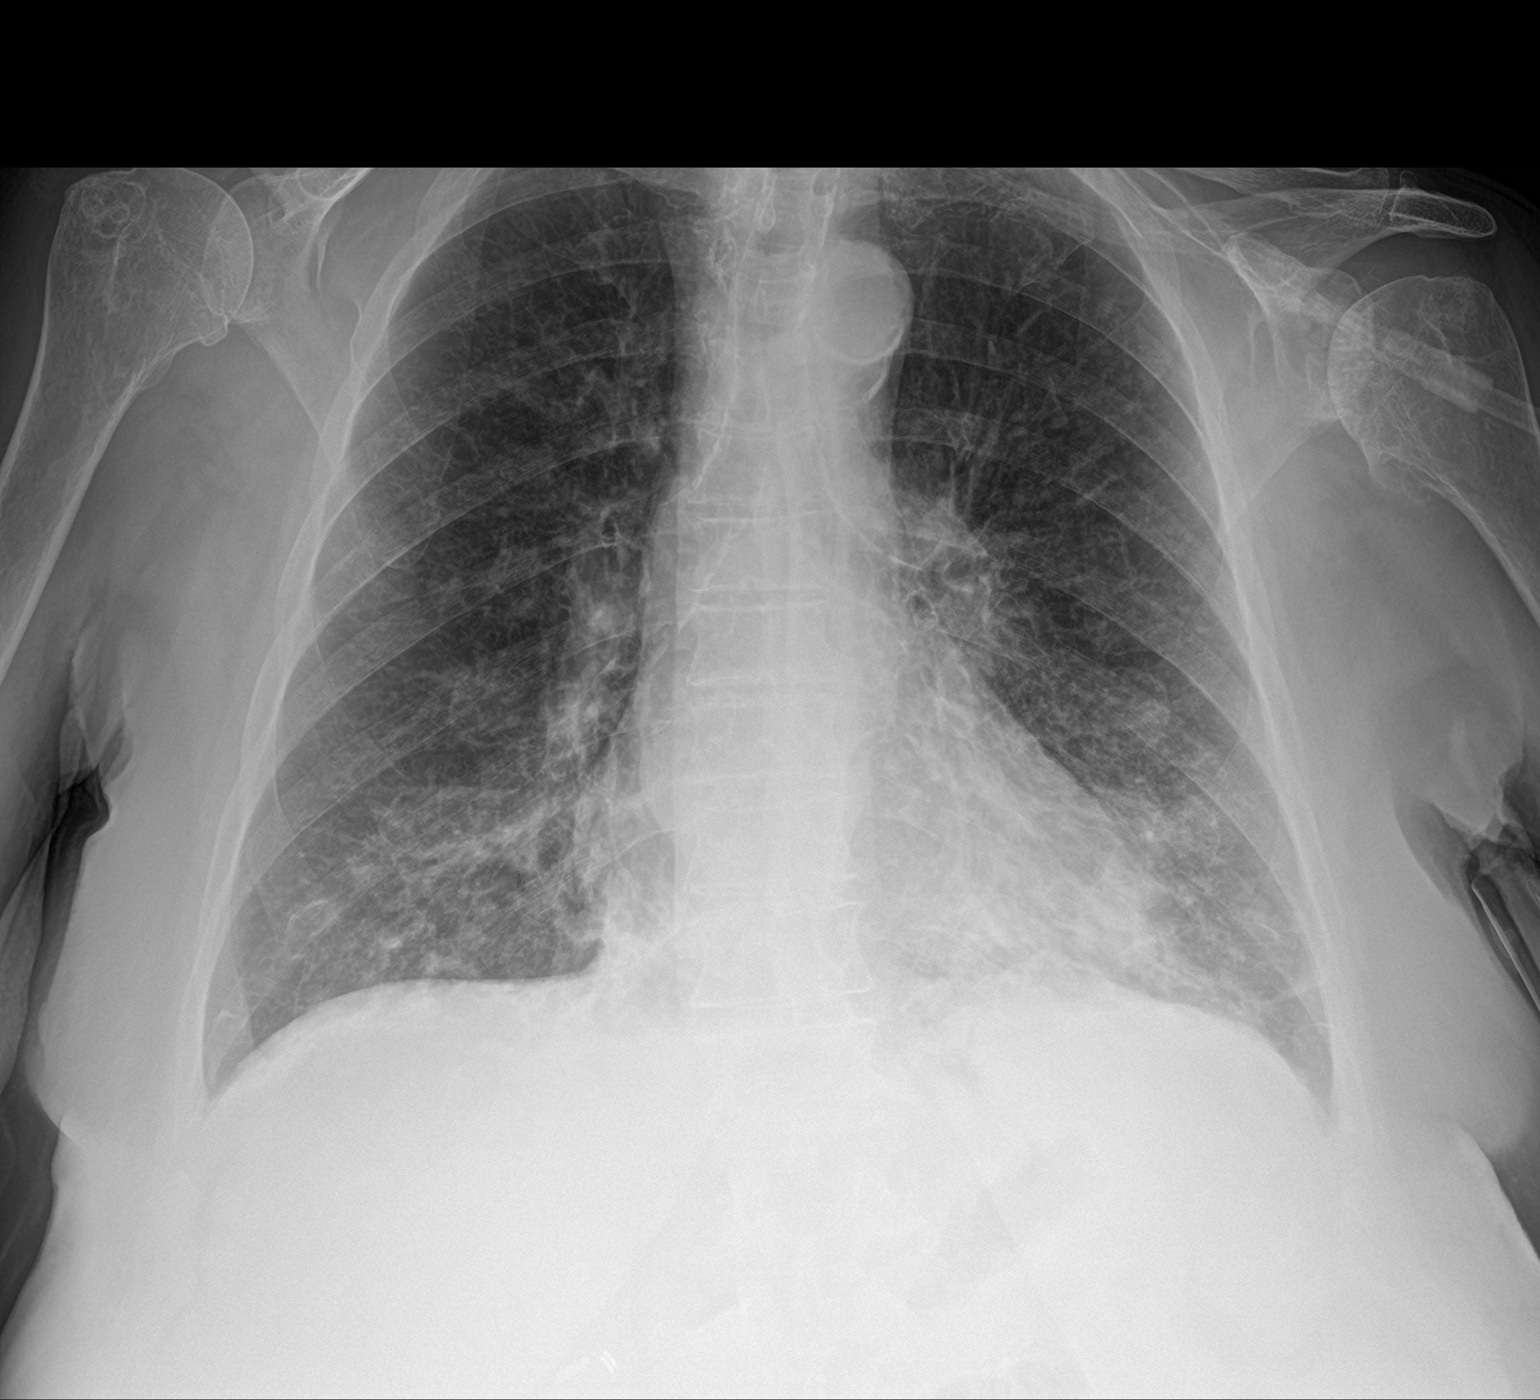

[2 of 2 positions shown; findings below may reference images not displayed]

FINDINGS: Trachea is midline. Heart size normal. Bibasilar interstitial and
airspace opacification, new from prior exam. No dense airspace
consolidation or definite pleural fluid. Degenerative changes in
left shoulder.
IMPRESSION: Bibasilar mixed interstitial and airspace opacification is
indicative of COVID pneumonia.

## 2022-08-25 DIAGNOSIS — M5030 Other cervical disc degeneration, unspecified cervical region: Secondary | ICD-10-CM | POA: Diagnosis not present

## 2022-09-10 ENCOUNTER — Ambulatory Visit: Payer: Medicare PPO | Admitting: Internal Medicine

## 2022-09-12 ENCOUNTER — Ambulatory Visit: Payer: Medicare PPO | Admitting: Internal Medicine

## 2022-09-12 ENCOUNTER — Encounter: Payer: Self-pay | Admitting: Internal Medicine

## 2022-09-12 VITALS — BP 136/70 | HR 84 | Temp 97.9°F | Resp 16 | Ht 60.0 in | Wt 143.2 lb

## 2022-09-12 DIAGNOSIS — M25511 Pain in right shoulder: Secondary | ICD-10-CM

## 2022-09-12 DIAGNOSIS — M25512 Pain in left shoulder: Secondary | ICD-10-CM

## 2022-09-12 DIAGNOSIS — F419 Anxiety disorder, unspecified: Secondary | ICD-10-CM

## 2022-09-12 DIAGNOSIS — F338 Other recurrent depressive disorders: Secondary | ICD-10-CM

## 2022-09-12 DIAGNOSIS — I700 Atherosclerosis of aorta: Secondary | ICD-10-CM | POA: Diagnosis not present

## 2022-09-12 DIAGNOSIS — I10 Essential (primary) hypertension: Secondary | ICD-10-CM

## 2022-09-12 DIAGNOSIS — E782 Mixed hyperlipidemia: Secondary | ICD-10-CM

## 2022-09-12 DIAGNOSIS — D649 Anemia, unspecified: Secondary | ICD-10-CM

## 2022-09-12 DIAGNOSIS — D72829 Elevated white blood cell count, unspecified: Secondary | ICD-10-CM

## 2022-09-12 DIAGNOSIS — F339 Major depressive disorder, recurrent, unspecified: Secondary | ICD-10-CM | POA: Diagnosis not present

## 2022-09-12 DIAGNOSIS — R739 Hyperglycemia, unspecified: Secondary | ICD-10-CM

## 2022-09-12 DIAGNOSIS — I739 Peripheral vascular disease, unspecified: Secondary | ICD-10-CM

## 2022-09-12 DIAGNOSIS — N1831 Chronic kidney disease, stage 3a: Secondary | ICD-10-CM | POA: Diagnosis not present

## 2022-09-12 NOTE — Progress Notes (Unsigned)
Subjective:    Patient ID: Alicia Mccann, female    DOB: [birth year], 89 y.o.   MRN: [id number]  Patient here for  Chief Complaint  Patient presents with   Medical Management of Chronic Issues    HPI Here to follow up regarding increased stress and anxiety.  She is enjoying her new dog. Helping.  Walks her.  Trying to stay active.  Still with increased neck and shoulder pain.  Saw ortho.  Took prednisone taper.  Did help some.     Past Medical History:  Diagnosis Date   Allergic rhinitis    Anemia    Anxiety    Cellulitis    Cholelithiasis    Essential hypertension    History of stress test    a. 12/2008 Ex MV: EF 78%, no ischemia.   Insomnia    Osteoarthritis    Tachycardia    Valvular heart disease    a. 12/2008 Echo: EF 65%, mild MR;  b. 2/6 SEM RUSB - insignificant murmur, prob Ao Sclerosis.   Past Surgical History:  Procedure Laterality Date   BACK SURGERY     BUNIONECTOMY     CATARACT EXTRACTION W/PHACO Right 05/18/2019   Procedure: CATARACT EXTRACTION PHACO AND INTRAOCULAR LENS PLACEMENT (IOC) RIGHT MALYUGIN  01:06.5  20.6%  13.72;  Surgeon: Leandrew Koyanagi, MD;  Location: [place];  Service: Ophthalmology;  Laterality: Right;  please leave patient arrival 10   CATARACT EXTRACTION W/PHACO Left 06/08/2019   Procedure: CATARACT EXTRACTION PHACO AND INTRAOCULAR LENS PLACEMENT (IOC) LEFT  00:51.0  20.3%  10.37;  Surgeon: Leandrew Koyanagi, MD;  Location: [place];  Service: Ophthalmology;  Laterality: Left;  ARRIVAL 10:30 PLEASE LEAVE   CHOLECYSTECTOMY  03/29/2007   GALLBLADDER SURGERY     HIP SURGERY     bilateral    REPLACEMENT TOTAL KNEE Right    THUMB ARTHROSCOPY     Family History  Problem Relation Age of Onset   Heart attack Father    Stroke Father    Heart disease Father    CAD Father    Heart attack Son 81       MI   Hypertension Son    Hypertension Mother    Heart disease Mother    Stroke Mother    Breast cancer Neg  Hx    Social History   Socioeconomic History   Marital status: Widowed    Spouse name: Not on file   Number of children: Not on file   Years of education: Not on file   Highest education level: Not on file  Occupational History   Not on file  Tobacco Use   Smoking status: Never   Smokeless tobacco: Never  Vaping Use   Vaping Use: Never used  Substance and Sexual Activity   Alcohol use: Yes    Comment: 2 oz QOD   Drug use: No   Sexual activity: Not Currently  Other Topics Concern   Not on file  Social History Narrative   Not on file   Social Determinants of Health   Financial Resource Strain: Low Risk  (10/04/2021)   Overall Financial Resource Strain (CARDIA)    Difficulty of Paying Living Expenses: Not hard at all  Food Insecurity: No Food Insecurity (10/04/2021)   Hunger Vital Sign    Worried About Running Out of Food in the Last Year: Never true    Ran Out of Food in the Last Year: Never true  Transportation Needs: No  Transportation Needs (10/04/2021)   PRAPARE - Hydrologist (Medical): No    Lack of Transportation (Non-Medical): No  Physical Activity: Insufficiently Active (10/04/2021)   Exercise Vital Sign    Days of Exercise per Week: 4 days    Minutes of Exercise per Session: 20 min  Stress: No Stress Concern Present (10/04/2021)   [place]    Feeling of Stress : Not at all  Social Connections: Unknown (10/04/2021)   Social Connection and Isolation Panel [NHANES]    Frequency of Communication with Friends and Family: Not on file    Frequency of Social Gatherings with Friends and Family: Not on file    Attends Religious Services: Not on file    Active Member of Clubs or Organizations: Not on file    Attends Archivist Meetings: Not on file    Marital Status: Widowed     Review of Systems     Objective:     BP 136/70   Pulse 84   Temp 97.9 F (36.6  C)   Resp 16   Ht 5' (1.524 m)   Wt 143 lb 3.2 oz (65 kg)   SpO2 97%   BMI 27.97 kg/m  Wt Readings from Last 3 Encounters:  09/12/22 143 lb 3.2 oz (65 kg)  07/01/22 149 lb 12.8 oz (67.9 kg)  05/27/22 156 lb 9.6 oz (71 kg)    Physical Exam   Outpatient Encounter Medications as of 09/12/2022  Medication Sig   acetaminophen (TYLENOL) 500 MG tablet Take 1,000 mg by mouth every 8 (eight) hours as needed for mild pain or moderate pain.   ALPRAZolam (XANAX) 0.25 MG tablet TAKE 1 TABLET BY MOUTH DAILY AS NEEDED FOR ANXIETY   amLODipine (NORVASC) 2.5 MG tablet TAKE ONE TABLET BY MOUTH EVERY DAY   aspirin 81 MG EC tablet Take 81 mg by mouth daily. Swallow whole.   atorvastatin (LIPITOR) 10 MG tablet TAKE ONE TABLET BY MOUTH EVERY DAY   cholecalciferol (VITAMIN D) 1000 UNITS tablet Take 1,000 Units by mouth every other day.    cyanocobalamin 100 MCG tablet Take 100 mcg by mouth daily.   sertraline (ZOLOFT) 100 MG tablet Take 1 tablet (100 mg total) by mouth daily.   triamterene-hydrochlorothiazide (MAXZIDE-25) 37.5-25 MG tablet TAKE ONE TABLET BY MOUTH EVERY DAY   No facility-administered encounter medications on file as of 09/12/2022.     Lab Results  Component Value Date   WBC 12.0 (H) 07/01/2022   HGB 12.8 07/01/2022   HCT 38.8 07/01/2022   PLT 360.0 07/01/2022   GLUCOSE 83 07/01/2022   CHOL 147 05/20/2022   TRIG 121.0 05/20/2022   HDL 50.80 05/20/2022   LDLDIRECT 66.0 09/07/2018   LDLCALC 72 05/20/2022   ALT 14 05/20/2022   AST 16 05/20/2022   NA 139 07/01/2022   K 3.6 07/01/2022   CL 100 07/01/2022   CREATININE 1.14 07/01/2022   BUN 26 (H) 07/01/2022   CO2 27 07/01/2022   TSH 3.74 05/20/2022   INR 0.9 01/16/2009   HGBA1C 6.3 05/20/2022    No results found.     Assessment & Plan:  There are no diagnoses linked to this encounter.   Einar Pheasant, MD

## 2022-09-13 ENCOUNTER — Encounter: Payer: Self-pay | Admitting: Internal Medicine

## 2022-09-13 NOTE — Assessment & Plan Note (Signed)
Follow cbc.  

## 2022-09-13 NOTE — Assessment & Plan Note (Signed)
Continue atorvastatin and aspirin ?

## 2022-09-13 NOTE — Assessment & Plan Note (Signed)
Overall appears to be doing better as outlined. Continue zoloft. Notify me if feels needs any further intervention.  Follow.

## 2022-09-13 NOTE — Assessment & Plan Note (Signed)
Continue atorvastatin

## 2022-09-13 NOTE — Assessment & Plan Note (Signed)
Discussed.  On zoloft.  Continue.  Follow.  Appears to be doing better.

## 2022-09-13 NOTE — Assessment & Plan Note (Signed)
Recheck cbc next labs.  

## 2022-09-13 NOTE — Assessment & Plan Note (Signed)
Describes bilateral neck and shoulder pain.  Previous ESR wnl.  Saw ortho.  Given steroid taper.  Helped some.  Discussed need for f/u with ortho/PT. Plans to f/u with ortho.

## 2022-09-13 NOTE — Assessment & Plan Note (Signed)
Low carb diet and exercise.  Follow met b and a1c.  

## 2022-09-13 NOTE — Assessment & Plan Note (Signed)
Continue triam/hctz and amlodipine.  Follow pressures.  Follow metabolic panel.

## 2022-09-13 NOTE — Assessment & Plan Note (Signed)
Increased stress and anxiety. On zoloft.  Appears to be doing better.  Her new dog - helping.  Discussed.  Notify me if feels needs further intervention.  Follow.

## 2022-09-13 NOTE — Assessment & Plan Note (Signed)
On lipitor.  Low cholesterol diet and exercise.  Follow lipid panel and liver function tests.   

## 2022-09-13 NOTE — Assessment & Plan Note (Signed)
Continue to avoid antiinflammatories.  Stay hydrated.  Follow metabolic panel.

## 2022-09-22 DIAGNOSIS — M5030 Other cervical disc degeneration, unspecified cervical region: Secondary | ICD-10-CM | POA: Diagnosis not present

## 2022-09-24 ENCOUNTER — Other Ambulatory Visit: Payer: Self-pay | Admitting: Physician Assistant

## 2022-09-24 DIAGNOSIS — M5030 Other cervical disc degeneration, unspecified cervical region: Secondary | ICD-10-CM

## 2022-10-01 ENCOUNTER — Ambulatory Visit
Admission: RE | Admit: 2022-10-01 | Discharge: 2022-10-01 | Disposition: A | Discharge status: Home / Self Care | Payer: Medicare PPO | Source: Ambulatory Visit | Attending: Physician Assistant | Admitting: Physician Assistant

## 2022-10-01 DIAGNOSIS — M5030 Other cervical disc degeneration, unspecified cervical region: Secondary | ICD-10-CM

## 2022-10-01 DIAGNOSIS — M4802 Spinal stenosis, cervical region: Secondary | ICD-10-CM | POA: Diagnosis not present

## 2022-10-03 DIAGNOSIS — M4802 Spinal stenosis, cervical region: Secondary | ICD-10-CM | POA: Diagnosis not present

## 2022-10-03 DIAGNOSIS — M5030 Other cervical disc degeneration, unspecified cervical region: Secondary | ICD-10-CM | POA: Diagnosis not present

## 2022-10-03 DIAGNOSIS — G9589 Other specified diseases of spinal cord: Secondary | ICD-10-CM | POA: Diagnosis not present

## 2022-10-06 ENCOUNTER — Institutional Professional Consult (permissible substitution): Payer: Medicare PPO | Admitting: Internal Medicine

## 2022-10-07 ENCOUNTER — Ambulatory Visit
Admission: RE | Admit: 2022-10-07 | Discharge: 2022-10-07 | Disposition: A | Discharge status: Home / Self Care | Payer: Self-pay | Source: Ambulatory Visit | Attending: Neurosurgery | Admitting: Neurosurgery

## 2022-10-07 ENCOUNTER — Other Ambulatory Visit: Payer: Self-pay

## 2022-10-07 DIAGNOSIS — Z049 Encounter for examination and observation for unspecified reason: Secondary | ICD-10-CM

## 2022-10-09 ENCOUNTER — Ambulatory Visit: Payer: Medicare Other | Admitting: Neurosurgery

## 2022-10-13 NOTE — Progress Notes (Unsigned)
Referring Physician:  Harvest Dark, FNP [address] [place],  [place] [address]  Primary Physician:  Einar Pheasant, MD  History of Present Illness: 10/14/2022 [name] is here today with a chief complaint of neck pain that extends into her bilateral trapezius muscles.  She gets shocks of pain that occur nearly daily.  Cervical rotation and movements of her neck make it worse.  Nothing really makes it better.  The pain can be as bad as 9 out of 10.  She walks with a cane.  She has not had any change in her mobility or balance lately.  She reports that she has extensive arthritis all over her body.  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  Bilateral posterior neck pain without radiation into shoulder with tension and has a dull headache   Bowel/Bladder Dysfunction: none  Conservative measures:  Physical therapy:  has not participated  Multimodal medical therapy including regular antiinflammatories:  tylenol, tizanidine, bio freeze Injections:  has not received epidural steroid injections  Past Surgery:  Lumbar Surgery x3  Alicia Mccann has no symptoms of cervical myelopathy.  The symptoms are causing a significant impact on the patient's life.   I have utilized the care everywhere function in epic to review the outside records available from external health systems.  Review of Systems:  A 10 point review of systems is negative, except for the pertinent positives and negatives detailed in the HPI.  Past Medical History: Past Medical History:  Diagnosis Date   Allergic rhinitis    Anemia    Anxiety    Cellulitis    Cholelithiasis    Essential hypertension    History of stress test    a. 12/2008 Ex MV: EF 78%, no ischemia.   Insomnia    Osteoarthritis    Tachycardia    Valvular heart disease    a. 12/2008 Echo: EF 65%, mild MR;  b. 2/6 SEM RUSB - insignificant murmur, prob Ao Sclerosis.     Past Surgical History: Past Surgical History:  Procedure Laterality Date   BACK SURGERY     BUNIONECTOMY     CATARACT EXTRACTION W/PHACO Right 05/18/2019   Procedure: CATARACT EXTRACTION PHACO AND INTRAOCULAR LENS PLACEMENT (IOC) RIGHT MALYUGIN  01:06.5  20.6%  13.72;  Surgeon: Alicia Koyanagi, MD;  Location: [facility];  Service: Ophthalmology;  Laterality: Right;  please leave patient arrival 10   CATARACT EXTRACTION W/PHACO Left 06/08/2019   Procedure: CATARACT EXTRACTION PHACO AND INTRAOCULAR LENS PLACEMENT (IOC) LEFT  00:51.0  20.3%  10.37;  Surgeon: Alicia Koyanagi, MD;  Location: [facility];  Service: Ophthalmology;  Laterality: Left;  ARRIVAL 10:30 PLEASE LEAVE   CHOLECYSTECTOMY  03/29/2007   GALLBLADDER SURGERY     HIP SURGERY     bilateral    REPLACEMENT TOTAL KNEE Right    THUMB ARTHROSCOPY      Allergies: Allergies as of 10/14/2022 - Review Complete 10/14/2022  Allergen Reaction Noted   Clindamycin/lincomycin  12/09/2016   Egg white (egg protein)  04/30/2015   Eggs or egg-derived products  06/27/2013   Flagyl [metronidazole]  12/09/2016   Levaquin [levofloxacin]  05/01/2015   Mobic [meloxicam]  05/08/2017   Penicillins  06/27/2013   Sulfa antibiotics  06/27/2013    Medications: Current Meds  Medication Sig   acetaminophen (TYLENOL) 500 MG tablet Take 1,000 mg by mouth every 8 (eight) hours as needed for  mild pain or moderate pain.   ALPRAZolam (XANAX) 0.25 MG tablet TAKE 1 TABLET BY MOUTH DAILY AS NEEDED FOR ANXIETY   amLODipine (NORVASC) 2.5 MG tablet TAKE ONE TABLET BY MOUTH EVERY DAY   aspirin 81 MG EC tablet Take 81 mg by mouth daily. Swallow whole.   atorvastatin (LIPITOR) 10 MG tablet TAKE ONE TABLET BY MOUTH EVERY DAY   cholecalciferol (VITAMIN D) 1000 UNITS tablet Take 1,000 Units by mouth every other day.    cyanocobalamin 100 MCG tablet Take 100 mcg by mouth daily.   sertraline (ZOLOFT) 100 MG tablet Take 1 tablet (100 mg  total) by mouth daily.   triamterene-hydrochlorothiazide (MAXZIDE-25) 37.5-25 MG tablet TAKE ONE TABLET BY MOUTH EVERY DAY    Social History: Social History   Tobacco Use   Smoking status: Never   Smokeless tobacco: Never  Vaping Use   Vaping Use: Never used  Substance Use Topics   Alcohol use: Yes    Comment: 2 oz QOD   Drug use: No    Family Medical History: Family History  Problem Relation Age of Onset   Heart attack Father    Stroke Father    Heart disease Father    CAD Father    Heart attack Son 72       MI   Hypertension Son    Hypertension Mother    Heart disease Mother    Stroke Mother    Breast cancer Neg Hx     Physical Examination: Vitals:   10/14/22 1554  BP: 130/75  SpO2: 98%    General: Patient is well developed, well nourished, calm, collected, and in no apparent distress. Attention to examination is appropriate.  Neck:   Supple.  Limited range of motion.  Respiratory: Patient is breathing without any difficulty.   NEUROLOGICAL:     Awake, alert, oriented to person, place, and time.  Speech is clear and fluent.   Cranial Nerves: Pupils equal round and reactive to light.  Facial tone is symmetric.  Facial sensation is symmetric. Shoulder shrug is symmetric. Tongue protrusion is midline.  There is no pronator drift.  ROM of spine: full.    Strength: Side Biceps Triceps Deltoid Interossei Grip Wrist Ext. Wrist Flex.  R '5 5 5 5 5 5 5  '$ L '5 5 5 5 5 5 5   '$ Side Iliopsoas Quads Hamstring PF DF EHL  R '5 5 5 5 5 5  '$ L '5 5 5 5 5 5   '$ Reflexes are 1+ and symmetric at the biceps, triceps, brachioradialis, patella and achilles.   Hoffman's is absent.   Bilateral upper and lower extremity sensation is intact to light touch.    No evidence of dysmetria noted.  Gait requires cane.     Medical Decision Making  Imaging: MRI C spine 10/01/2022 IMPRESSION: 1. Advanced and generalized cervical spine degeneration with multilevel listhesis. Especially  severe facet degeneration on the right at C1-2 where there is sclerosis and marrow edema. 2. Spinal stenosis with cord flattening at C4-5 and C5-6. T2 hyperintensity in the cord at C5. 3. Right C2 impingement due to the described facet disease. 4. Biforaminal impingement at C4-5 and C5-6.     Electronically Signed   By: Jorje Guild M.D.   On: 10/02/2022 08:10    I have personally reviewed the images and agree with the above interpretation.  Assessment and Plan: Ms. Mckeel is a pleasant 89 y.o. female with severe cervical stenosis with myelomalacia.  She  has anterolisthesis of the cervical spine.  She does not seem terribly symptomatic from this at this point.  She does have cervical radiculopathy due to multilevel radicular compression.  Given her relative lack of symptoms, I think it is reasonable for her to watch her symptoms over time.  If she develops any worsening symptoms of myelopathy, we will consider posterior laminoplasty C4-6 with foraminotomies.  I will see her back in 2 months.  I will help arrange some epidural steroid injections for her.    Thank you for involving me in the care of this patient.      Loucinda Croy K. Izora Ribas MD, [facility] Neurosurgery

## 2022-10-14 ENCOUNTER — Ambulatory Visit (INDEPENDENT_AMBULATORY_CARE_PROVIDER_SITE_OTHER): Payer: Medicare PPO | Admitting: Neurosurgery

## 2022-10-14 ENCOUNTER — Encounter: Payer: Self-pay | Admitting: Neurosurgery

## 2022-10-14 VITALS — BP 130/75 | Ht 61.0 in | Wt 142.2 lb

## 2022-10-14 DIAGNOSIS — M5412 Radiculopathy, cervical region: Secondary | ICD-10-CM | POA: Diagnosis not present

## 2022-10-14 DIAGNOSIS — G9589 Other specified diseases of spinal cord: Secondary | ICD-10-CM

## 2022-10-14 DIAGNOSIS — M4802 Spinal stenosis, cervical region: Secondary | ICD-10-CM | POA: Diagnosis not present

## 2022-10-14 DIAGNOSIS — G959 Disease of spinal cord, unspecified: Secondary | ICD-10-CM

## 2022-10-17 ENCOUNTER — Telehealth: Payer: Self-pay | Admitting: Internal Medicine

## 2022-10-17 NOTE — Telephone Encounter (Signed)
Pt called in staying that she just found out yesterday that she's having neck surgery on 3/12 @ [facility] or [facility](either one) and she's very concern about this surgery, however, she was wondering if she can see Dr. Nicki Reaper before then for advice? I didn't se any slot open before then, she will like to F/U with either Larena Glassman with a phone call regarding this matter. She's available [phone].

## 2022-10-17 NOTE — Telephone Encounter (Signed)
Patient states she misunderstood. Patient states she may have to have an injection for the arthritis in her neck, not surgery.  Patient states she will be ok and she does not need an additional appointment with Dr. Einar Pheasant.  Patient apologizes for any confusion.

## 2022-10-17 NOTE — Telephone Encounter (Signed)
Noted  

## 2022-10-27 ENCOUNTER — Ambulatory Visit: Payer: Medicare PPO | Admitting: Internal Medicine

## 2022-10-28 DIAGNOSIS — M5412 Radiculopathy, cervical region: Secondary | ICD-10-CM | POA: Diagnosis not present

## 2022-10-28 DIAGNOSIS — M4802 Spinal stenosis, cervical region: Secondary | ICD-10-CM | POA: Diagnosis not present

## 2022-11-04 ENCOUNTER — Ambulatory Visit (INDEPENDENT_AMBULATORY_CARE_PROVIDER_SITE_OTHER): Payer: Medicare PPO | Admitting: Internal Medicine

## 2022-11-04 ENCOUNTER — Encounter: Payer: Self-pay | Admitting: Internal Medicine

## 2022-11-04 VITALS — BP 134/62 | HR 83 | Temp 98.0°F | Ht 61.0 in | Wt 144.6 lb

## 2022-11-04 DIAGNOSIS — F5101 Primary insomnia: Secondary | ICD-10-CM | POA: Diagnosis not present

## 2022-11-04 DIAGNOSIS — G479 Sleep disorder, unspecified: Secondary | ICD-10-CM | POA: Insufficient documentation

## 2022-11-04 MED ORDER — ZOLPIDEM TARTRATE 5 MG PO TABS: 5.0000 mg | ORAL_TABLET | Freq: Every evening | ORAL | 0 refills | Status: DC | PRN

## 2022-11-04 NOTE — Progress Notes (Signed)
Name: Alicia Mccann MRN: [id number] DOB: Aug 21, 1932    CHIEF COMPLAINT:  INSOMNIA   HISTORY OF PRESENT ILLNESS: Patient is seen today for problems and issues with sleep related to excessive daytime sleepiness Very anxious during the day and keeps her awake at night Worries about every thing Has been prescribed Xanax and does NOT like how it makes her feel   Discussed sleep data and reviewed with patient.  Encouraged proper weight management.  Decrease naps during the day, so night time sleep will become enhanced.  Limit caffeine, and sleep deprivation.   PATIENT DOES NOT WANT TO BE TESTED FOR SLEEP APNEA   No evidence of heart failure at this time No evidence or signs of infection at this time No respiratory distress No fevers, chills, nausea, vomiting, diarrhea No evidence of lower extremity edema No evidence hemoptysis     PAST MEDICAL HISTORY :   has a past medical history of Allergic rhinitis, Anemia, Anxiety, Cellulitis, Cholelithiasis, Essential hypertension, History of stress test, Insomnia, Osteoarthritis, Tachycardia, and Valvular heart disease.  has a past surgical history that includes Hip surgery; Back surgery; Gallbladder surgery; Bunionectomy; Cholecystectomy (03/29/2007); Thumb arthroscopy; Replacement total knee (Right); Cataract extraction w/PHACO (Right, 05/18/2019); and Cataract extraction w/PHACO (Left, 06/08/2019). Prior to Admission medications   Medication Sig Start Date End Date Taking? Authorizing Provider  acetaminophen (TYLENOL) 500 MG tablet Take 1,000 mg by mouth every 8 (eight) hours as needed for mild pain or moderate pain.   Yes [provider]  ALPRAZolam (XANAX) 0.25 MG tablet TAKE 1 TABLET BY MOUTH DAILY AS NEEDED FOR ANXIETY 07/29/22  Yes Einar Pheasant, MD  amLODipine (NORVASC) 2.5 MG tablet TAKE ONE TABLET BY MOUTH EVERY DAY 04/14/22  Yes Gollan, Kathlene November, MD  aspirin 81 MG EC tablet Take 81 mg by mouth daily. Swallow  whole.   Yes [provider]  atorvastatin (LIPITOR) 10 MG tablet TAKE ONE TABLET BY MOUTH EVERY DAY 04/14/22  Yes Gollan, Kathlene November, MD  cholecalciferol (VITAMIN D) 1000 UNITS tablet Take 1,000 Units by mouth every other day.    Yes [provider]  cyanocobalamin 100 MCG tablet Take 100 mcg by mouth daily.   Yes [provider]  triamterene-hydrochlorothiazide (MAXZIDE-25) 37.5-25 MG tablet TAKE ONE TABLET BY MOUTH EVERY DAY 04/14/22  Yes Gollan, Kathlene November, MD  sertraline (ZOLOFT) 100 MG tablet Take 1 tablet (100 mg total) by mouth daily. Patient not taking: Reported on 11/04/2022 07/29/22   Einar Pheasant, MD   Allergies  Allergen Reactions   Clindamycin/Lincomycin    Egg White (Egg Protein)    Egg-Derived Products    Flagyl [Metronidazole]    Levaquin [Levofloxacin]    Mobic [Meloxicam]     GI issues    Penicillins    Sulfa Antibiotics     FAMILY HISTORY:  family history includes CAD in her father; Heart attack in her father; Heart attack (age of onset: 25) in her son; Heart disease in her father and mother; Hypertension in her mother and son; Stroke in her father and mother. SOCIAL HISTORY:  reports that she has never smoked. She has never used smokeless tobacco. She reports current alcohol use. She reports that she does not use drugs.   Review of Systems:  Gen:  Denies  fever, sweats, chills weight loss  HEENT: Denies blurred vision, double vision, ear pain, eye pain, hearing loss, nose bleeds, sore throat Cardiac:  No dizziness, chest pain or heaviness, chest tightness,edema, No JVD Resp:  No cough, -sputum production, -shortness of breath,-wheezing, -hemoptysis,  Gi: Denies swallowing difficulty, stomach pain, nausea or vomiting, diarrhea, constipation, bowel incontinence Gu:  Denies bladder incontinence, burning urine Ext:   Denies Joint pain, stiffness or swelling Skin: Denies  skin rash, easy bruising or bleeding or hives Endoc:  Denies  polyuria, polydipsia , polyphagia or weight change Psych:   Denies depression, insomnia or hallucinations  Other:  All other systems negative   ALL OTHER ROS ARE NEGATIVE   BP 134/62 (BP Location: Left Arm, Cuff Size: Normal)   Pulse 83   SpO2 97%     Physical Examination:   General Appearance: No distress  EYES PERRLA, EOM intact.   NECK Supple, No JVD Pulmonary: normal breath sounds, No wheezing.  CardiovascularNormal S1,S2.  No m/r/g.   Abdomen: Benign, Soft, non-tender. Skin:   warm, no rashes, no ecchymosis  Extremities: normal, no cyanosis, clubbing. Neuro:without focal findings,  speech normal  PSYCHIATRIC: Mood, affect within normal limits.   ALL OTHER ROS ARE NEGATIVE    ASSESSMENT AND PLAN SYNOPSIS  Patient with signs and symptoms of Insomnia Will try Ambien 2.5 mg( I have instructed patient to cut 5 mg pill in half) and take nightly  Sleep Hygiene education provided   MEDICATION ADJUSTMENTS/LABS AND TESTS ORDERED: LETS TRY AMBIEN TO HELP YOU SLEEP  PLEASE CUT PILL IN HALF AND TAKE 10 MINS BEFORE YOU GO TO SLEEP.  PLEASE CALL IS IN COUPLE OF DAYS AND TELL us IF THE PILLS WORK   CURRENT MEDICATIONS REVIEWED AT LENGTH WITH PATIENT TODAY   Patient  satisfied with Plan of action and management. All questions answered  Follow up  as needed  Total Time Spent  35 mins   Corrin Parker, M.D.  [facility]  Medical Director [place] Director [facility] Cardio-Pulmonary Department

## 2022-11-04 NOTE — Patient Instructions (Signed)
LETS TRY AMBIEN TO HELP YOU SLEEP  PLEASE CUT PILL IN HALF AND TAKE 10 MINS BEFORE YOU GO TO SLEEP.  PLEASE CALL IS IN COUPLE OF DAYS AND TELL us IF THE PILLS WORK

## 2022-11-06 ENCOUNTER — Other Ambulatory Visit (INDEPENDENT_AMBULATORY_CARE_PROVIDER_SITE_OTHER): Payer: Medicare PPO

## 2022-11-06 ENCOUNTER — Telehealth: Payer: Self-pay | Admitting: Internal Medicine

## 2022-11-06 DIAGNOSIS — E782 Mixed hyperlipidemia: Secondary | ICD-10-CM | POA: Diagnosis not present

## 2022-11-06 DIAGNOSIS — D72829 Elevated white blood cell count, unspecified: Secondary | ICD-10-CM | POA: Diagnosis not present

## 2022-11-06 DIAGNOSIS — R739 Hyperglycemia, unspecified: Secondary | ICD-10-CM

## 2022-11-06 DIAGNOSIS — R300 Dysuria: Secondary | ICD-10-CM

## 2022-11-06 DIAGNOSIS — I10 Essential (primary) hypertension: Secondary | ICD-10-CM

## 2022-11-06 LAB — CBC WITH DIFFERENTIAL/PLATELET
Basophils Absolute: 0.1 10*3/uL (ref 0.0–0.1)
Basophils Relative: 0.8 % (ref 0.0–3.0)
Eosinophils Absolute: 0.5 10*3/uL (ref 0.0–0.7)
Eosinophils Relative: 5.2 % — ABNORMAL HIGH (ref 0.0–5.0)
HCT: 38.9 % (ref 36.0–46.0)
Hemoglobin: 12.7 g/dL (ref 12.0–15.0)
Lymphocytes Relative: 26.6 % (ref 12.0–46.0)
Lymphs Abs: 2.4 10*3/uL (ref 0.7–4.0)
MCHC: 32.6 g/dL (ref 30.0–36.0)
MCV: 89.8 fl (ref 78.0–100.0)
Monocytes Absolute: 0.6 10*3/uL (ref 0.1–1.0)
Monocytes Relative: 6.9 % (ref 3.0–12.0)
Neutro Abs: 5.5 10*3/uL (ref 1.4–7.7)
Neutrophils Relative %: 60.5 % (ref 43.0–77.0)
Platelets: 335 10*3/uL (ref 150.0–400.0)
RBC: 4.33 Mil/uL (ref 3.87–5.11)
RDW: 13.4 % (ref 11.5–15.5)
WBC: 9.1 10*3/uL (ref 4.0–10.5)

## 2022-11-06 LAB — HEPATIC FUNCTION PANEL
ALT: 11 U/L (ref 0–35)
AST: 15 U/L (ref 0–37)
Albumin: 4.2 g/dL (ref 3.5–5.2)
Alkaline Phosphatase: 71 U/L (ref 39–117)
Bilirubin, Direct: 0.1 mg/dL (ref 0.0–0.3)
Total Bilirubin: 0.5 mg/dL (ref 0.2–1.2)
Total Protein: 6.6 g/dL (ref 6.0–8.3)

## 2022-11-06 LAB — LIPID PANEL
Cholesterol: 137 mg/dL (ref 0–200)
HDL: 52 mg/dL (ref >39.00)
LDL Cholesterol: 68 mg/dL (ref 0–99)
NonHDL: 85.07
Total CHOL/HDL Ratio: 3
Triglycerides: 87 mg/dL (ref 0.0–149.0)
VLDL: 17.4 mg/dL (ref 0.0–40.0)

## 2022-11-06 LAB — BASIC METABOLIC PANEL
BUN: 24 mg/dL — ABNORMAL HIGH (ref 6–23)
CO2: 30 mEq/L (ref 19–32)
Calcium: 10 mg/dL (ref 8.4–10.5)
Chloride: 103 mEq/L (ref 96–112)
Creatinine, Ser: 1.06 mg/dL (ref 0.40–1.20)
GFR: 47.26 mL/min — ABNORMAL LOW (ref >60.00)
Glucose, Bld: 96 mg/dL (ref 70–99)
Potassium: 3.3 mEq/L — ABNORMAL LOW (ref 3.5–5.1)
Sodium: 141 mEq/L (ref 135–145)

## 2022-11-06 LAB — HEMOGLOBIN A1C: Hgb A1c MFr Bld: 6 % (ref 4.6–6.5)

## 2022-11-06 NOTE — Telephone Encounter (Signed)
ATC patient. Her phone just rang. It did not give me a voicemail to leave a message on. I will try again later.

## 2022-11-07 NOTE — Telephone Encounter (Signed)
Spoke to patient.  She stated that she slept better with Ambien last night. She feels that it may had taken a few days to work. She will call back on Monday and let us know how she slept over the weekend.  Nothing further needed.

## 2022-11-11 ENCOUNTER — Ambulatory Visit: Payer: Medicare PPO | Admitting: Internal Medicine

## 2022-11-11 ENCOUNTER — Encounter: Payer: Self-pay | Admitting: Internal Medicine

## 2022-11-11 VITALS — BP 130/70 | HR 89 | Temp 98.0°F | Resp 16 | Ht 62.0 in | Wt 139.0 lb

## 2022-11-11 DIAGNOSIS — I700 Atherosclerosis of aorta: Secondary | ICD-10-CM

## 2022-11-11 DIAGNOSIS — E876 Hypokalemia: Secondary | ICD-10-CM

## 2022-11-11 DIAGNOSIS — F339 Major depressive disorder, recurrent, unspecified: Secondary | ICD-10-CM | POA: Diagnosis not present

## 2022-11-11 DIAGNOSIS — D649 Anemia, unspecified: Secondary | ICD-10-CM

## 2022-11-11 DIAGNOSIS — F419 Anxiety disorder, unspecified: Secondary | ICD-10-CM | POA: Diagnosis not present

## 2022-11-11 DIAGNOSIS — H9190 Unspecified hearing loss, unspecified ear: Secondary | ICD-10-CM

## 2022-11-11 DIAGNOSIS — M25512 Pain in left shoulder: Secondary | ICD-10-CM

## 2022-11-11 DIAGNOSIS — Z1231 Encounter for screening mammogram for malignant neoplasm of breast: Secondary | ICD-10-CM | POA: Diagnosis not present

## 2022-11-11 DIAGNOSIS — E782 Mixed hyperlipidemia: Secondary | ICD-10-CM

## 2022-11-11 DIAGNOSIS — I10 Essential (primary) hypertension: Secondary | ICD-10-CM

## 2022-11-11 DIAGNOSIS — R739 Hyperglycemia, unspecified: Secondary | ICD-10-CM

## 2022-11-11 DIAGNOSIS — I38 Endocarditis, valve unspecified: Secondary | ICD-10-CM

## 2022-11-11 DIAGNOSIS — D72829 Elevated white blood cell count, unspecified: Secondary | ICD-10-CM

## 2022-11-11 DIAGNOSIS — I739 Peripheral vascular disease, unspecified: Secondary | ICD-10-CM

## 2022-11-11 DIAGNOSIS — N1831 Chronic kidney disease, stage 3a: Secondary | ICD-10-CM

## 2022-11-11 DIAGNOSIS — M25511 Pain in right shoulder: Secondary | ICD-10-CM

## 2022-11-11 NOTE — Progress Notes (Signed)
Subjective:    Patient ID: Alicia Mccann, female    DOB: 08/18/1933, 89 y.o.   MRN: [id number]  Patient here for  Chief Complaint  Patient presents with   Medical Management of Chronic Issues    HPI Here to follow up regarding increased stress and anxiety. Was on zoloft. Stopped. Friend who is accompanying her states she felt it made her more cloudy. Has been having increased neck and shoulder pain.  Saw ortho f/u - 09/22/22 - MRI c-spine - advanced and generalized cervical spine degeneration with multilevel listhesis. Especially severe facet degeneration on the  right at C1-2 where there is sclerosis and marrow edema. Saw physiatry and then referred to [facility].  Saw Dr Izora Ribas - 10/14/22 - elected to monitor given relative lack of symptoms.  Recommended ESI - performed 10/28/22. Is better.  Has f/u next week with physiatry.  Discussed therapy.  Also saw Dr Mortimer Fries - sleep issues.  Recommended trial of ambien. Unclear if helping.  No chest pain reported.  Breathing stable. Discussed recent labs.    Past Medical History:  Diagnosis Date   Allergic rhinitis    Anemia    Anxiety    Cellulitis    Cholelithiasis    Essential hypertension    History of stress test    a. 12/2008 Ex MV: EF 78%, no ischemia.   Insomnia    Osteoarthritis    Tachycardia    Valvular heart disease    a. 12/2008 Echo: EF 65%, mild MR;  b. 2/6 SEM RUSB - insignificant murmur, prob Ao Sclerosis.   Past Surgical History:  Procedure Laterality Date   BACK SURGERY     BUNIONECTOMY     CATARACT EXTRACTION W/PHACO Right 05/18/2019   Procedure: CATARACT EXTRACTION PHACO AND INTRAOCULAR LENS PLACEMENT (IOC) RIGHT MALYUGIN  01:06.5  20.6%  13.72;  Surgeon: Leandrew Koyanagi, MD;  Location: [facility];  Service: Ophthalmology;  Laterality: Right;  please leave patient arrival 10   CATARACT EXTRACTION W/PHACO Left 06/08/2019   Procedure: CATARACT EXTRACTION PHACO AND INTRAOCULAR LENS PLACEMENT (IOC) LEFT  00:51.0   20.3%  10.37;  Surgeon: Leandrew Koyanagi, MD;  Location: [facility];  Service: Ophthalmology;  Laterality: Left;  ARRIVAL 10:30 PLEASE LEAVE   CHOLECYSTECTOMY  03/29/2007   GALLBLADDER SURGERY     HIP SURGERY     bilateral    REPLACEMENT TOTAL KNEE Right    THUMB ARTHROSCOPY     Family History  Problem Relation Age of Onset   Heart attack Father    Stroke Father    Heart disease Father    CAD Father    Heart attack Son 40       MI   Hypertension Son    Hypertension Mother    Heart disease Mother    Stroke Mother    Breast cancer Neg Hx    Social History   Socioeconomic History   Marital status: Widowed    Spouse name: Not on file   Number of children: Not on file   Years of education: Not on file   Highest education level: Not on file  Occupational History   Not on file  Tobacco Use   Smoking status: Never   Smokeless tobacco: Never  Vaping Use   Vaping Use: Never used  Substance and Sexual Activity   Alcohol use: Yes    Comment: 2 oz QOD   Drug use: No   Sexual activity: Not Currently  Other Topics Concern  Not on file  Social History Narrative   Not on file   Social Determinants of Health   Financial Resource Strain: Low Risk  (10/04/2021)   Overall Financial Resource Strain (CARDIA)    Difficulty of Paying Living Expenses: Not hard at all  Food Insecurity: No Food Insecurity (10/04/2021)   Hunger Vital Sign    Worried About Running Out of Food in the Last Year: Never true    Ran Out of Food in the Last Year: Never true  Transportation Needs: No Transportation Needs (10/04/2021)   PRAPARE - Hydrologist (Medical): No    Lack of Transportation (Non-Medical): No  Physical Activity: Insufficiently Active (10/04/2021)   Exercise Vital Sign    Days of Exercise per Week: 4 days    Minutes of Exercise per Session: 20 min  Stress: No Stress Concern Present (10/04/2021)   [place]    Feeling of Stress : Not at all  Social Connections: Unknown (10/04/2021)   Social Connection and Isolation Panel [NHANES]    Frequency of Communication with Friends and Family: Not on file    Frequency of Social Gatherings with Friends and Family: Not on file    Attends Religious Services: Not on file    Active Member of Clubs or Organizations: Not on file    Attends Archivist Meetings: Not on file    Marital Status: Widowed     Review of Systems  Constitutional:  Negative for fever and unexpected weight change.  HENT:  Negative for congestion and sinus pressure.   Respiratory:  Negative for cough, chest tightness and shortness of breath.   Cardiovascular:  Negative for chest pain and palpitations.  Gastrointestinal:  Negative for abdominal pain, diarrhea, nausea and vomiting.  Genitourinary:  Negative for difficulty urinating and dysuria.  Musculoskeletal:  Negative for myalgias.       Neck and shoulder pain.   Skin:  Negative for color change and rash.  Neurological:  Negative for dizziness and headaches.  Psychiatric/Behavioral:  Negative for agitation.        Increased stress - not able to do things she used to do.        Objective:     BP 130/70   Pulse 89   Temp 98 F (36.7 C)   Resp 16   Ht 5\' 2"  (1.575 m)   Wt 139 lb (63 kg)   SpO2 98%   BMI 25.42 kg/m  Wt Readings from Last 3 Encounters:  11/11/22 139 lb (63 kg)  11/04/22 144 lb 9.6 oz (65.6 kg)  10/14/22 142 lb 3.2 oz (64.5 kg)    Physical Exam Vitals reviewed.  Constitutional:      General: She is not in acute distress.    Appearance: Normal appearance.  HENT:     Head: Normocephalic and atraumatic.     Right Ear: External ear normal.     Left Ear: External ear normal.  Eyes:     General: No scleral icterus.       Right eye: No discharge.        Left eye: No discharge.     Conjunctiva/sclera: Conjunctivae normal.  Neck:     Thyroid: No  thyromegaly.  Cardiovascular:     Rate and Rhythm: Normal rate and regular rhythm.  Pulmonary:     Effort: No respiratory distress.     Breath sounds: Normal breath sounds. No wheezing.  Abdominal:     General: Bowel sounds are normal.     Palpations: Abdomen is soft.     Tenderness: There is no abdominal tenderness.  Musculoskeletal:        General: No swelling or tenderness.     Cervical back: Neck supple. No tenderness.  Lymphadenopathy:     Cervical: No cervical adenopathy.  Skin:    Findings: No erythema or rash.  Neurological:     Mental Status: She is alert.  Psychiatric:        Mood and Affect: Mood normal.        Behavior: Behavior normal.      Outpatient Encounter Medications as of 11/11/2022  Medication Sig   acetaminophen (TYLENOL) 500 MG tablet Take 1,000 mg by mouth every 8 (eight) hours as needed for mild pain or moderate pain.   ALPRAZolam (XANAX) 0.25 MG tablet TAKE 1 TABLET BY MOUTH DAILY AS NEEDED FOR ANXIETY   amLODipine (NORVASC) 2.5 MG tablet TAKE ONE TABLET BY MOUTH EVERY DAY   aspirin 81 MG EC tablet Take 81 mg by mouth daily. Swallow whole.   atorvastatin (LIPITOR) 10 MG tablet TAKE ONE TABLET BY MOUTH EVERY DAY   cholecalciferol (VITAMIN D) 1000 UNITS tablet Take 1,000 Units by mouth every other day.    cyanocobalamin 100 MCG tablet Take 100 mcg by mouth daily.   triamterene-hydrochlorothiazide (MAXZIDE-25) 37.5-25 MG tablet TAKE ONE TABLET BY MOUTH EVERY DAY   zolpidem (AMBIEN) 5 MG tablet Take 1 tablet (5 mg total) by mouth at bedtime as needed for sleep.   [DISCONTINUED] sertraline (ZOLOFT) 100 MG tablet Take 1 tablet (100 mg total) by mouth daily. (Patient not taking: Reported on 11/04/2022)   No facility-administered encounter medications on file as of 11/11/2022.     Lab Results  Component Value Date   WBC 9.1 11/06/2022   HGB 12.7 11/06/2022   HCT 38.9 11/06/2022   PLT 335.0 11/06/2022   GLUCOSE 96 11/06/2022   CHOL 137 11/06/2022   TRIG  87.0 11/06/2022   HDL 52.00 11/06/2022   LDLDIRECT 66.0 09/07/2018   LDLCALC 68 11/06/2022   ALT 11 11/06/2022   AST 15 11/06/2022   NA 141 11/06/2022   K 3.7 11/11/2022   CL 103 11/06/2022   CREATININE 1.06 11/06/2022   BUN 24 (H) 11/06/2022   CO2 30 11/06/2022   TSH 3.74 05/20/2022   INR 0.9 01/16/2009   HGBA1C 6.0 11/06/2022    No results found.     Assessment & Plan:  Hypokalemia Assessment & Plan: Recheck potassium today.    Orders: -     Potassium  Visit for screening mammogram -     3D Screening Mammogram, Left and Right; Future  Anemia, unspecified type Assessment & Plan: Follow cbc.    Anxiety Assessment & Plan: Increased stress and anxiety. Previously on zoloft.  Had reported doing better.  Is off now.  Unclear reason.  May have caused her to feel foggy. Her new dog - helping.  Discussed.  Notify me if feels needs further intervention.  Follow.     Aortic atherosclerosis (HCC) Assessment & Plan: Continue atorvastatin.    Bilateral shoulder pain, unspecified chronicity Assessment & Plan: Saw ortho f/u - 09/22/22 - MRI c-spine - advanced and generalized cervical spine degeneration with multilevel listhesis. Especially severe facet degeneration on the  right at C1-2 where there is sclerosis and marrow edema. Saw physiatry and then referred to [facility].  Saw Dr Izora Ribas - 10/14/22 -  elected to monitor given relative lack of symptoms.  Recommended ESI - performed 10/28/22. Is better.  Has f/u next week with physiatry.  Discussed therapy.    Stage 3a chronic kidney disease ([place]) Assessment & Plan: Continue to avoid antiinflammatories.  Stay hydrated.  Follow metabolic panel. Discussed recent labs.    Depression, recurrent ([place]) Assessment & Plan: Discussed.  Off zoloft.  Her dog is helping.  Follow.  Appears to be doing better.     Decreased hearing, unspecified laterality Assessment & Plan: Has hearing aids.     Essential hypertension Assessment &  Plan: Continue triam/hctz and amlodipine.  Follow pressures.  Follow metabolic panel.   Orders: -     Basic metabolic panel; Future  Hyperglycemia Assessment & Plan: Low carb diet and exercise.  Follow met b and a1c.    Orders: -     Hemoglobin A1c; Future  Mixed hyperlipidemia Assessment & Plan: On lipitor.  Low cholesterol diet and exercise.  Follow lipid panel and liver function tests.    Orders: -     Lipid panel; Future -     Hepatic function panel; Future  PAD (peripheral artery disease) (HCC) Assessment & Plan: Continue atorvastatin and aspirin.     Valvular heart disease Assessment & Plan: Had echo - aortic valve sclerosis.  Follow.    Leukocytosis, unspecified type Assessment & Plan: Recent white blood cell count wnl.        Einar Pheasant, MD

## 2022-11-11 NOTE — Patient Instructions (Signed)
Your Medicare Annual Wellness Visit is due. Please schedule this at check out. 

## 2022-11-12 LAB — POTASSIUM: Potassium: 3.7 mEq/L (ref 3.5–5.1)

## 2022-11-16 ENCOUNTER — Encounter: Payer: Self-pay | Admitting: Internal Medicine

## 2022-11-16 NOTE — Assessment & Plan Note (Signed)
Had echo - aortic valve sclerosis.  Follow.

## 2022-11-16 NOTE — Assessment & Plan Note (Signed)
Has hearing aids.

## 2022-11-16 NOTE — Assessment & Plan Note (Signed)
Follow cbc.  

## 2022-11-16 NOTE — Assessment & Plan Note (Signed)
Recheck potassium today. 

## 2022-11-16 NOTE — Assessment & Plan Note (Signed)
Discussed.  Off zoloft.  Her dog is helping.  Follow.  Appears to be doing better.

## 2022-11-16 NOTE — Assessment & Plan Note (Signed)
Continue atorvastatin and aspirin ?

## 2022-11-16 NOTE — Assessment & Plan Note (Signed)
Recent white blood cell count wnl.   

## 2022-11-16 NOTE — Assessment & Plan Note (Signed)
Continue triam/hctz and amlodipine.  Follow pressures.  Follow metabolic panel.  °

## 2022-11-16 NOTE — Assessment & Plan Note (Signed)
Low carb diet and exercise.  Follow met b and a1c.   

## 2022-11-16 NOTE — Assessment & Plan Note (Signed)
Continue to avoid antiinflammatories.  Stay hydrated.  Follow metabolic panel. Discussed recent labs.

## 2022-11-16 NOTE — Assessment & Plan Note (Signed)
Increased stress and anxiety. Previously on zoloft.  Had reported doing better.  Is off now.  Unclear reason.  May have caused her to feel foggy. Her new dog - helping.  Discussed.  Notify me if feels needs further intervention.  Follow.

## 2022-11-16 NOTE — Assessment & Plan Note (Signed)
Saw ortho f/u - 09/22/22 - MRI c-spine - advanced and generalized cervical spine degeneration with multilevel listhesis. Especially severe facet degeneration on the  right at C1-2 where there is sclerosis and marrow edema. Saw physiatry and then referred to [facility].  Saw Dr Izora Ribas - 10/14/22 - elected to monitor given relative lack of symptoms.  Recommended ESI - performed 10/28/22. Is better.  Has f/u next week with physiatry.  Discussed therapy.

## 2022-11-16 NOTE — Assessment & Plan Note (Signed)
On lipitor.  Low cholesterol diet and exercise.  Follow lipid panel and liver function tests.   

## 2022-11-16 NOTE — Assessment & Plan Note (Signed)
Continue atorvastatin

## 2022-11-25 DIAGNOSIS — M4802 Spinal stenosis, cervical region: Secondary | ICD-10-CM | POA: Diagnosis not present

## 2022-11-25 DIAGNOSIS — M5030 Other cervical disc degeneration, unspecified cervical region: Secondary | ICD-10-CM | POA: Diagnosis not present

## 2022-11-25 DIAGNOSIS — G9589 Other specified diseases of spinal cord: Secondary | ICD-10-CM | POA: Diagnosis not present

## 2022-11-25 DIAGNOSIS — M5412 Radiculopathy, cervical region: Secondary | ICD-10-CM | POA: Diagnosis not present

## 2022-12-11 ENCOUNTER — Ambulatory Visit: Payer: Medicare PPO | Admitting: Neurosurgery

## 2022-12-12 ENCOUNTER — Other Ambulatory Visit: Payer: Self-pay | Admitting: Internal Medicine

## 2022-12-15 NOTE — Telephone Encounter (Signed)
Received request for refill.  Need to confirm if she is taking zoloft.  See note.

## 2022-12-16 ENCOUNTER — Ambulatory Visit: Payer: Medicare PPO | Admitting: Neurosurgery

## 2022-12-16 NOTE — Telephone Encounter (Signed)
Please refuse. Patient is not taking,. 

## 2022-12-16 NOTE — Telephone Encounter (Signed)
Please refuse. Patient is not taking,.

## 2022-12-18 ENCOUNTER — Telehealth: Payer: Self-pay

## 2022-12-18 NOTE — Telephone Encounter (Signed)
Patient states she was getting dressed and fell.  Patient states when she tried to get up, she realized that her legs weren't working to get her up, so she slid over to the edge of the bed.  Patient states she has major arthritis.  Patient states her daughter came and got her in the bed and she slept all night.  Patient states her daughter has called her three times today, telling her to call Dr. Dale Moffat, so she is calling Dr. Lorin Picket.  Patient states she would like to have a call back.  Patient states she did cancel her hair appointment today because she doesn't think she should drive over there.  Patient states she would like to know if Dr. Lorin Picket thinks it's ok for her to drink some coffee, patient states she does have decaf.

## 2022-12-18 NOTE — Telephone Encounter (Signed)
Called to speak with patient. She did not fall. She felt like she was going to fall so she was able to walk to her bed and instead of trying to get into bed and risk sliding out of bed. She got a pillow and put it in the floor to sit on and called her daughter to come and help her in the bed. She got in bed and slept all night. No injury noted. No increased pain. She had been up on her feet and out doing things she needed to get done. Advised that it is good for her to be active but does not need to over do it. Patient agreed and felt that she had pushed herself yesterday. Patient was due for a follow up with you in June so she has been scheduled to come see you. Nothing further needed at this time. Patient will call us if feels like she needs to come in sooner.

## 2023-01-12 ENCOUNTER — Other Ambulatory Visit: Payer: Self-pay | Admitting: Internal Medicine

## 2023-01-13 ENCOUNTER — Encounter: Payer: Self-pay | Admitting: Neurosurgery

## 2023-01-13 ENCOUNTER — Ambulatory Visit: Payer: Medicare PPO | Admitting: Neurosurgery

## 2023-01-13 VITALS — BP 136/78 | Ht 62.0 in | Wt 137.0 lb

## 2023-01-13 DIAGNOSIS — M4802 Spinal stenosis, cervical region: Secondary | ICD-10-CM | POA: Diagnosis not present

## 2023-01-13 DIAGNOSIS — G8929 Other chronic pain: Secondary | ICD-10-CM | POA: Diagnosis not present

## 2023-01-13 DIAGNOSIS — M25512 Pain in left shoulder: Secondary | ICD-10-CM

## 2023-01-13 NOTE — Progress Notes (Signed)
Referring Physician:  No referring provider defined for this encounter.  Primary Physician:  Dale Winthrop, MD  History of Present Illness: 01/13/2023 Alicia Mccann underwent epidural steroid injection on the right at C4-5 since I last saw her.  She is now having some left-sided pain that was similar to her right-sided pain previously.  She is having pain in her left shoulder.  Otherwise she has not noticed any new changes to her symptoms.  Her right-sided shoulder pain is much better.   10/14/2022  Alicia Mccann is here today with a chief complaint of neck pain that extends into her bilateral trapezius muscles.  She gets shocks of pain that occur nearly daily.  Cervical rotation and movements of her neck make it worse.  Nothing really makes it better.  The pain can be as bad as 9 out of 10.  She walks with a cane.  She has not had any change in her mobility or balance lately.  She reports that she has extensive arthritis all over her body.  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  Bilateral posterior neck pain without radiation into shoulder with tension and has a dull headache   Bowel/Bladder Dysfunction: none  Conservative measures:  Physical therapy:  has not participated  Multimodal medical therapy including regular antiinflammatories:  tylenol, tizanidine, bio freeze Injections:  has not received epidural steroid injections  Past Surgery:  Lumbar Surgery x3  DARNICE OSAKI has no symptoms of cervical myelopathy.  The symptoms are causing a significant impact on the patient's life.   I have utilized the care everywhere function in epic to review the outside records available from external health systems.  Review of Systems:  A 10 point review of systems is negative, except for the pertinent positives and negatives detailed in the HPI.  Past Medical History: Past Medical History:  Diagnosis Date   Allergic  rhinitis    Anemia    Anxiety    Cellulitis    Cholelithiasis    Essential hypertension    History of stress test    a. 12/2008 Ex MV: EF 78%, no ischemia.   Insomnia    Osteoarthritis    Tachycardia    Valvular heart disease    a. 12/2008 Echo: EF 65%, mild MR;  b. 2/6 SEM RUSB - insignificant murmur, prob Ao Sclerosis.    Past Surgical History: Past Surgical History:  Procedure Laterality Date   BACK SURGERY     BUNIONECTOMY     CATARACT EXTRACTION W/PHACO Right 05/18/2019   Procedure: CATARACT EXTRACTION PHACO AND INTRAOCULAR LENS PLACEMENT (IOC) RIGHT MALYUGIN  01:06.5  20.6%  13.72;  Surgeon: Lockie Mola, MD;  Location: [facility];  Service: Ophthalmology;  Laterality: Right;  please leave patient arrival 10   CATARACT EXTRACTION W/PHACO Left 06/08/2019   Procedure: CATARACT EXTRACTION PHACO AND INTRAOCULAR LENS PLACEMENT (IOC) LEFT  00:51.0  20.3%  10.37;  Surgeon: Lockie Mola, MD;  Location: [facility];  Service: Ophthalmology;  Laterality: Left;  ARRIVAL 10:30 PLEASE LEAVE   CHOLECYSTECTOMY  03/29/2007   GALLBLADDER SURGERY     HIP SURGERY     bilateral    REPLACEMENT TOTAL KNEE Right    THUMB ARTHROSCOPY      Allergies: Allergies as of 01/13/2023 - Review Complete 01/13/2023  Allergen Reaction Noted   Clindamycin/lincomycin  12/09/2016   Egg white (egg protein)  04/30/2015   Egg-derived products  06/27/2013   Flagyl [metronidazole]  12/09/2016   Levaquin [levofloxacin]  05/01/2015   Mobic [meloxicam]  05/08/2017   Penicillins  06/27/2013   Sulfa antibiotics  06/27/2013    Medications: Current Meds  Medication Sig   acetaminophen (TYLENOL) 500 MG tablet Take 1,000 mg by mouth every 8 (eight) hours as needed for mild pain or moderate pain.   ALPRAZolam (XANAX) 0.25 MG tablet TAKE 1 TABLET BY MOUTH DAILY AS NEEDED FOR ANXIETY   amLODipine (NORVASC) 2.5 MG tablet TAKE ONE TABLET BY MOUTH EVERY DAY   aspirin 81 MG EC tablet Take  81 mg by mouth daily. Swallow whole.   atorvastatin (LIPITOR) 10 MG tablet TAKE ONE TABLET BY MOUTH EVERY DAY   cholecalciferol (VITAMIN D) 1000 UNITS tablet Take 1,000 Units by mouth every other day.    cyanocobalamin 100 MCG tablet Take 100 mcg by mouth daily.   triamterene-hydrochlorothiazide (MAXZIDE-25) 37.5-25 MG tablet TAKE ONE TABLET BY MOUTH EVERY DAY   zolpidem (AMBIEN) 5 MG tablet Take 1 tablet (5 mg total) by mouth at bedtime as needed for sleep.    Social History: Social History   Tobacco Use   Smoking status: Never   Smokeless tobacco: Never  Vaping Use   Vaping Use: Never used  Substance Use Topics   Alcohol use: Yes    Comment: 2 oz QOD   Drug use: No    Family Medical History: Family History  Problem Relation Age of Onset   Heart attack Father    Stroke Father    Heart disease Father    CAD Father    Heart attack Son 28       MI   Hypertension Son    Hypertension Mother    Heart disease Mother    Stroke Mother    Breast cancer Neg Hx     Physical Examination: Vitals:   01/13/23 1355  BP: 136/78    General: Patient is well developed, well nourished, calm, collected, and in no apparent distress. Attention to examination is appropriate.  Neck:   Supple.  Limited range of motion.  Respiratory: Patient is breathing without any difficulty.   NEUROLOGICAL:     Awake, alert, oriented to person, place, and time.  Speech is clear and fluent.   Cranial Nerves: Pupils equal round and reactive to light.  Facial tone is symmetric.  Facial sensation is symmetric. Shoulder shrug is symmetric. Tongue protrusion is midline.  There is no pronator drift.  ROM of spine: full.    Strength: Side Biceps Triceps Deltoid Interossei Grip Wrist Ext. Wrist Flex.  R 5 5 5 5 5 5 5   L 5 5 4+ 5 5 5 5    Side Iliopsoas Quads Hamstring PF DF EHL  R 5 5 5 5 5 5   L 5 5 5 5 5 5    Reflexes are 1+ and symmetric at the biceps, triceps, brachioradialis, patella and achilles.    Hoffman's is absent.   Bilateral upper and lower extremity sensation is intact to light touch.    No evidence of dysmetria noted.  Gait requires cane.     Medical Decision Making  Imaging: MRI C spine 10/01/2022 IMPRESSION: 1. Advanced and generalized cervical spine degeneration with multilevel listhesis. Especially severe facet degeneration on the right at C1-2 where there is sclerosis and marrow edema. 2. Spinal stenosis with cord flattening at C4-5 and C5-6. T2 hyperintensity in the cord at C5. 3. Right C2 impingement due to the described facet disease.  4. Biforaminal impingement at C4-5 and C5-6.     Electronically Signed   By: Tiburcio Pea M.D.   On: 10/02/2022 08:10    I have personally reviewed the images and agree with the above interpretation.  Assessment and Plan: Alicia Mccann is a pleasant 89 y.o. female with severe cervical stenosis with myelomalacia.  She has anterolisthesis of the cervical spine.  She is having some issues with balance, but these have not changed.  She is not very interested in surgical intervention.  She is having pain around her left shoulder that seems consistent with shoulder pathology.  She is seeing the physiatry department at [facility].  I recommended that she talk with them about possible shoulder or neck injections on the left.  If her symptoms change and she develops more significant symptoms consistent with myelopathy, we will discuss cervical spinal decompression.  At this point, I would not recommend this.  She is talking with her primary care provider soon as she is having some issues with her appetite.  She is currently walking with a cane and feels comfortable with this.     We discussed that I am happy to see her at any point if her symptoms worsen or she feels like she needs to be reevaluated for possible myelopathy.  At this point, she is not interested in considering surgical intervention.   Thank you for involving me in  the care of this patient.      Sumedh Shinsato K. Myer Haff MD, [facility] Neurosurgery

## 2023-01-26 ENCOUNTER — Ambulatory Visit: Payer: Medicare PPO | Admitting: Internal Medicine

## 2023-01-26 NOTE — Progress Notes (Deleted)
Subjective:    Patient ID: Alicia Mccann, female    DOB: Jan 04, 1933, 89 y.o.   MRN: [id number]  Patient here for No chief complaint on file.   HPI Here to follow up regarding hypertension, increased stress and anxiety.  Saw ortho f/u - 09/22/22 - MRI c-spine - advanced and generalized cervical spine degeneration with multilevel listhesis. Especially severe facet degeneration on the  right at C1-2 where there is sclerosis and marrow edema. Saw physiatry and then referred to [facility].  Saw Dr Myer Haff - 10/14/22 - elected to monitor at that time. S/p ESI - performed 10/28/22. Helped. Had f/u with [facility] 11/25/22.  Rx for tramadol prn. Had f/u with Dr Myer Haff 01/13/23 - reported pain - left shoulder felt to be c/w shoulder pathology.  Recommended f/u with physiatry regarding possible shoulder or neck injections.    Past Medical History:  Diagnosis Date   Allergic rhinitis    Anemia    Anxiety    Cellulitis    Cholelithiasis    Essential hypertension    History of stress test    a. 12/2008 Ex MV: EF 78%, no ischemia.   Insomnia    Osteoarthritis    Tachycardia    Valvular heart disease    a. 12/2008 Echo: EF 65%, mild MR;  b. 2/6 SEM RUSB - insignificant murmur, prob Ao Sclerosis.   Past Surgical History:  Procedure Laterality Date   BACK SURGERY     BUNIONECTOMY     CATARACT EXTRACTION W/PHACO Right 05/18/2019   Procedure: CATARACT EXTRACTION PHACO AND INTRAOCULAR LENS PLACEMENT (IOC) RIGHT MALYUGIN  01:06.5  20.6%  13.72;  Surgeon: Lockie Mola, MD;  Location: [facility];  Service: Ophthalmology;  Laterality: Right;  please leave patient arrival 10   CATARACT EXTRACTION W/PHACO Left 06/08/2019   Procedure: CATARACT EXTRACTION PHACO AND INTRAOCULAR LENS PLACEMENT (IOC) LEFT  00:51.0  20.3%  10.37;  Surgeon: Lockie Mola, MD;  Location: [facility];  Service: Ophthalmology;  Laterality: Left;  ARRIVAL 10:30 PLEASE LEAVE   CHOLECYSTECTOMY  03/29/2007    GALLBLADDER SURGERY     HIP SURGERY     bilateral    REPLACEMENT TOTAL KNEE Right    THUMB ARTHROSCOPY     Family History  Problem Relation Age of Onset   Heart attack Father    Stroke Father    Heart disease Father    CAD Father    Heart attack Son 81       MI   Hypertension Son    Hypertension Mother    Heart disease Mother    Stroke Mother    Breast cancer Neg Hx    Social History   Socioeconomic History   Marital status: Widowed    Spouse name: Not on file   Number of children: Not on file   Years of education: Not on file   Highest education level: Not on file  Occupational History   Not on file  Tobacco Use   Smoking status: Never   Smokeless tobacco: Never  Vaping Use   Vaping Use: Never used  Substance and Sexual Activity   Alcohol use: Yes    Comment: 2 oz QOD   Drug use: No   Sexual activity: Not Currently  Other Topics Concern   Not on file  Social History Narrative   Not on file   Social Determinants of Health   Financial Resource Strain: Low Risk  (10/04/2021)   Overall Financial Resource Strain (  CARDIA)    Difficulty of Paying Living Expenses: Not hard at all  Food Insecurity: No Food Insecurity (10/04/2021)   Hunger Vital Sign    Worried About Running Out of Food in the Last Year: Never true    Ran Out of Food in the Last Year: Never true  Transportation Needs: No Transportation Needs (10/04/2021)   PRAPARE - Administrator, Civil Service (Medical): No    Lack of Transportation (Non-Medical): No  Physical Activity: Insufficiently Active (10/04/2021)   Exercise Vital Sign    Days of Exercise per Week: 4 days    Minutes of Exercise per Session: 20 min  Stress: No Stress Concern Present (10/04/2021)   [employer] of Occupational Health - Occupational Stress Questionnaire    Feeling of Stress : Not at all  Social Connections: Unknown (10/04/2021)   Social Connection and Isolation Panel [NHANES]    Frequency of Communication  with Friends and Family: Not on file    Frequency of Social Gatherings with Friends and Family: Not on file    Attends Religious Services: Not on file    Active Member of Clubs or Organizations: Not on file    Attends Banker Meetings: Not on file    Marital Status: Widowed     Review of Systems     Objective:     There were no vitals taken for this visit. Wt Readings from Last 3 Encounters:  01/13/23 137 lb (62.1 kg)  11/11/22 139 lb (63 kg)  11/04/22 144 lb 9.6 oz (65.6 kg)    Physical Exam   Outpatient Encounter Medications as of 01/26/2023  Medication Sig   acetaminophen (TYLENOL) 500 MG tablet Take 1,000 mg by mouth every 8 (eight) hours as needed for mild pain or moderate pain.   ALPRAZolam (XANAX) 0.25 MG tablet TAKE 1 TABLET BY MOUTH DAILY AS NEEDED FOR ANXIETY   amLODipine (NORVASC) 2.5 MG tablet TAKE ONE TABLET BY MOUTH EVERY DAY   aspirin 81 MG EC tablet Take 81 mg by mouth daily. Swallow whole.   atorvastatin (LIPITOR) 10 MG tablet TAKE ONE TABLET BY MOUTH EVERY DAY   cholecalciferol (VITAMIN D) 1000 UNITS tablet Take 1,000 Units by mouth every other day.    cyanocobalamin 100 MCG tablet Take 100 mcg by mouth daily.   triamterene-hydrochlorothiazide (MAXZIDE-25) 37.5-25 MG tablet TAKE ONE TABLET BY MOUTH EVERY DAY   zolpidem (AMBIEN) 5 MG tablet Take 1 tablet (5 mg total) by mouth at bedtime as needed for sleep.   No facility-administered encounter medications on file as of 01/26/2023.     Lab Results  Component Value Date   WBC 9.1 11/06/2022   HGB 12.7 11/06/2022   HCT 38.9 11/06/2022   PLT 335.0 11/06/2022   GLUCOSE 96 11/06/2022   CHOL 137 11/06/2022   TRIG 87.0 11/06/2022   HDL 52.00 11/06/2022   LDLDIRECT 66.0 09/07/2018   LDLCALC 68 11/06/2022   ALT 11 11/06/2022   AST 15 11/06/2022   NA 141 11/06/2022   K 3.7 11/11/2022   CL 103 11/06/2022   CREATININE 1.06 11/06/2022   BUN 24 (H) 11/06/2022   CO2 30 11/06/2022   TSH 3.74  05/20/2022   INR 0.9 01/16/2009   HGBA1C 6.0 11/06/2022    No results found.     Assessment & Plan:  There are no diagnoses linked to this encounter.   Dale [place], MD

## 2023-01-27 DIAGNOSIS — M5412 Radiculopathy, cervical region: Secondary | ICD-10-CM | POA: Diagnosis not present

## 2023-01-27 DIAGNOSIS — G9589 Other specified diseases of spinal cord: Secondary | ICD-10-CM | POA: Diagnosis not present

## 2023-01-27 DIAGNOSIS — M5030 Other cervical disc degeneration, unspecified cervical region: Secondary | ICD-10-CM | POA: Diagnosis not present

## 2023-01-30 ENCOUNTER — Encounter: Payer: Self-pay | Admitting: Internal Medicine

## 2023-01-30 ENCOUNTER — Ambulatory Visit: Payer: Medicare PPO | Admitting: Internal Medicine

## 2023-01-30 VITALS — BP 120/70 | HR 78 | Temp 98.0°F | Resp 16 | Ht 62.0 in | Wt 133.0 lb

## 2023-01-30 DIAGNOSIS — I700 Atherosclerosis of aorta: Secondary | ICD-10-CM

## 2023-01-30 DIAGNOSIS — I739 Peripheral vascular disease, unspecified: Secondary | ICD-10-CM | POA: Diagnosis not present

## 2023-01-30 DIAGNOSIS — G479 Sleep disorder, unspecified: Secondary | ICD-10-CM

## 2023-01-30 DIAGNOSIS — N1831 Chronic kidney disease, stage 3a: Secondary | ICD-10-CM

## 2023-01-30 DIAGNOSIS — F338 Other recurrent depressive disorders: Secondary | ICD-10-CM

## 2023-01-30 DIAGNOSIS — I10 Essential (primary) hypertension: Secondary | ICD-10-CM

## 2023-01-30 DIAGNOSIS — L989 Disorder of the skin and subcutaneous tissue, unspecified: Secondary | ICD-10-CM

## 2023-01-30 DIAGNOSIS — F419 Anxiety disorder, unspecified: Secondary | ICD-10-CM | POA: Diagnosis not present

## 2023-01-30 DIAGNOSIS — R739 Hyperglycemia, unspecified: Secondary | ICD-10-CM | POA: Diagnosis not present

## 2023-01-30 DIAGNOSIS — F339 Major depressive disorder, recurrent, unspecified: Secondary | ICD-10-CM

## 2023-01-30 DIAGNOSIS — M25512 Pain in left shoulder: Secondary | ICD-10-CM

## 2023-01-30 DIAGNOSIS — E782 Mixed hyperlipidemia: Secondary | ICD-10-CM

## 2023-01-30 DIAGNOSIS — M25511 Pain in right shoulder: Secondary | ICD-10-CM

## 2023-01-30 NOTE — Progress Notes (Unsigned)
Subjective:    Patient ID: Alicia Mccann, female    DOB: 01/17/1933, 88 y.o.   MRN: [id number]  Patient here for  Chief Complaint  Patient presents with   Medical Management of Chronic Issues    HPI Here to follow up regarding increased stress and anxiety. Was just evaluated by [facility] 01/27/23 - pain in left lateral neck with radiation to the left trapezius ridge to the shoulder.  MRI cervical spine without contrast from [place] dated 10/01/2018 for imaging and report reviewed today. C1-2 noted facet collapse and spurring with sclerosis and right C2 impingement likely. C2-3 facet degenerative changes with disc bulging no stenosis. C3-4 disc desiccation with mild bilateral foraminal stenosis. No central stenosis. C4-5 disc collapse with severe central stenosis and noted bilateral foraminal stenosis. C5-6 disc collapse and endplate ridging with severe central stenosis and cord flattening with T2 hyperintensity likely reflecting myelomalacia. C6-7 to space narrowing without stenosis. C7-T1 degenerative facet arthritis without stenosis. Recommended continuing tramadol prn.  Recommended NSU f/u and order placed for ESI.  Planning in the near future.  Feeling better.  Sleeping better.  No cough or congestion.  No abdominal pain.  Request referral to dermatology - skin lesion - leg.     Past Medical History:  Diagnosis Date   Allergic rhinitis    Anemia    Anxiety    Cellulitis    Cholelithiasis    Essential hypertension    History of stress test    a. 12/2008 Ex MV: EF 78%, no ischemia.   Insomnia    Osteoarthritis    Tachycardia    Valvular heart disease    a. 12/2008 Echo: EF 65%, mild MR;  b. 2/6 SEM RUSB - insignificant murmur, prob Ao Sclerosis.   Past Surgical History:  Procedure Laterality Date   BACK SURGERY     BUNIONECTOMY     CATARACT EXTRACTION W/PHACO Right 05/18/2019   Procedure: CATARACT EXTRACTION PHACO AND INTRAOCULAR LENS PLACEMENT (IOC) RIGHT MALYUGIN  01:06.5   20.6%  13.72;  Surgeon: Lockie Mola, MD;  Location[facility];  Service: Ophthalmology;  Laterality: Right;  please leave patient arrival 10   CATARACT EXTRACTION W/PHACO Left 06/08/2019   Procedure: CATARACT EXTRACTION PHACO AND INTRAOCULAR LENS PLACEMENT (IOC) LEFT  00:51.0  20.3%  10.37;  Surgeon: Lockie Mola, MD;  Location: [facility];  Service: Ophthalmology;  Laterality: Left;  ARRIVAL 10:30 PLEASE LEAVE   CHOLECYSTECTOMY  03/29/2007   GALLBLADDER SURGERY     HIP SURGERY     bilateral    REPLACEMENT TOTAL KNEE Right    THUMB ARTHROSCOPY     Family History  Problem Relation Age of Onset   Heart attack Father    Stroke Father    Heart disease Father    CAD Father    Heart attack Son 34       MI   Hypertension Son    Hypertension Mother    Heart disease Mother    Stroke Mother    Breast cancer Neg Hx    Social History   Socioeconomic History   Marital status: Widowed    Spouse name: Not on file   Number of children: Not on file   Years of education: Not on file   Highest education level: Not on file  Occupational History   Not on file  Tobacco Use   Smoking status: Never   Smokeless tobacco: Never  Vaping Use   Vaping Use: Never used  Substance and Sexual Activity   Alcohol use: Yes    Comment: 2 oz QOD   Drug use: No   Sexual activity: Not Currently  Other Topics Concern   Not on file  Social History Narrative   Not on file   Social Determinants of Health   Financial Resource Strain: Low Risk  (10/04/2021)   Overall Financial Resource Strain (CARDIA)    Difficulty of Paying Living Expenses: Not hard at all  Food Insecurity: No Food Insecurity (10/04/2021)   Hunger Vital Sign    Worried About Running Out of Food in the Last Year: Never true    Ran Out of Food in the Last Year: Never true  Transportation Needs: No Transportation Needs (10/04/2021)   PRAPARE - Administrator, Civil Service (Medical): No    Lack  of Transportation (Non-Medical): No  Physical Activity: Insufficiently Active (10/04/2021)   Exercise Vital Sign    Days of Exercise per Week: 4 days    Minutes of Exercise per Session: 20 min  Stress: No Stress Concern Present (10/04/2021)   [employer] of Occupational Health - Occupational Stress Questionnaire    Feeling of Stress : Not at all  Social Connections: Unknown (10/04/2021)   Social Connection and Isolation Panel [NHANES]    Frequency of Communication with Friends and Family: Not on file    Frequency of Social Gatherings with Friends and Family: Not on file    Attends Religious Services: Not on file    Active Member of Clubs or Organizations: Not on file    Attends Banker Meetings: Not on file    Marital Status: Widowed     Review of Systems  Constitutional:  Negative for appetite change and unexpected weight change.  HENT:  Negative for congestion and sinus pressure.   Respiratory:  Negative for cough, chest tightness and shortness of breath.   Cardiovascular:  Negative for chest pain, palpitations and leg swelling.  Gastrointestinal:  Negative for abdominal pain, diarrhea, nausea and vomiting.  Genitourinary:  Negative for difficulty urinating and dysuria.  Musculoskeletal:  Positive for neck pain. Negative for myalgias.  Skin:  Negative for color change and rash.  Neurological:  Negative for dizziness and headaches.  Psychiatric/Behavioral:  Negative for agitation and dysphoric mood.        Objective:     BP 120/70   Pulse 78   Temp 98 F (36.7 C)   Resp 16   Ht 5\' 2"  (1.575 m)   Wt 133 lb (60.3 kg)   SpO2 99%   BMI 24.33 kg/m  Wt Readings from Last 3 Encounters:  01/30/23 133 lb (60.3 kg)  01/13/23 137 lb (62.1 kg)  11/11/22 139 lb (63 kg)    Physical Exam Vitals reviewed.  Constitutional:      General: She is not in acute distress.    Appearance: Normal appearance.  HENT:     Head: Normocephalic and atraumatic.     Right  Ear: External ear normal.     Left Ear: External ear normal.  Eyes:     General: No scleral icterus.       Right eye: No discharge.        Left eye: No discharge.     Conjunctiva/sclera: Conjunctivae normal.  Neck:     Thyroid: No thyromegaly.  Cardiovascular:     Rate and Rhythm: Normal rate and regular rhythm.  Pulmonary:     Effort: No respiratory distress.  Breath sounds: Normal breath sounds. No wheezing.  Abdominal:     General: Bowel sounds are normal.     Palpations: Abdomen is soft.     Tenderness: There is no abdominal tenderness.  Musculoskeletal:        General: No swelling or tenderness.     Cervical back: Neck supple. No tenderness.  Lymphadenopathy:     Cervical: No cervical adenopathy.  Skin:    Findings: No erythema or rash.  Neurological:     Mental Status: She is alert.  Psychiatric:        Mood and Affect: Mood normal.        Behavior: Behavior normal.      Outpatient Encounter Medications as of 01/30/2023  Medication Sig   acetaminophen (TYLENOL) 500 MG tablet Take 1,000 mg by mouth every 8 (eight) hours as needed for mild pain or moderate pain.   ALPRAZolam (XANAX) 0.25 MG tablet TAKE 1 TABLET BY MOUTH DAILY AS NEEDED FOR ANXIETY   amLODipine (NORVASC) 2.5 MG tablet TAKE ONE TABLET BY MOUTH EVERY DAY   aspirin 81 MG EC tablet Take 81 mg by mouth daily. Swallow whole.   atorvastatin (LIPITOR) 10 MG tablet TAKE ONE TABLET BY MOUTH EVERY DAY   cholecalciferol (VITAMIN D) 1000 UNITS tablet Take 1,000 Units by mouth every other day.    cyanocobalamin 100 MCG tablet Take 100 mcg by mouth daily.   triamterene-hydrochlorothiazide (MAXZIDE-25) 37.5-25 MG tablet TAKE ONE TABLET BY MOUTH EVERY DAY   zolpidem (AMBIEN) 5 MG tablet Take 1 tablet (5 mg total) by mouth at bedtime as needed for sleep.   No facility-administered encounter medications on file as of 01/30/2023.     Lab Results  Component Value Date   WBC 9.1 11/06/2022   HGB 12.7 11/06/2022    HCT 38.9 11/06/2022   PLT 335.0 11/06/2022   GLUCOSE 96 11/06/2022   CHOL 137 11/06/2022   TRIG 87.0 11/06/2022   HDL 52.00 11/06/2022   LDLDIRECT 66.0 09/07/2018   LDLCALC 68 11/06/2022   ALT 11 11/06/2022   AST 15 11/06/2022   NA 141 11/06/2022   K 3.7 11/11/2022   CL 103 11/06/2022   CREATININE 1.06 11/06/2022   BUN 24 (H) 11/06/2022   CO2 30 11/06/2022   TSH 3.74 05/20/2022   INR 0.9 01/16/2009   HGBA1C 6.0 11/06/2022    No results found.     Assessment & Plan:  Aortic atherosclerosis (HCC) Assessment & Plan: Continue atorvastatin.    Stage 3a chronic kidney disease (HCC) Assessment & Plan: Continue to avoid antiinflammatories.  Stay hydrated.  Follow metabolic panel.    Depression, recurrent (HCC) Assessment & Plan: Discussed.  Off zoloft.  Her dog is helping.  Follow.  Appears to be doing better.     Seasonal affective disorder [facility]) Assessment & Plan: Overall appears to be doing better as outlined. Off zoloft. Notify me if feels needs any further intervention.  Follow.    PAD (peripheral artery disease) (HCC) Assessment & Plan: Continue atorvastatin and aspirin.     Anxiety Assessment & Plan: Off zoloft. Doing better.  Feeling better.  Her dog (Rosie) helping.  Notify me if feels needs further intervention.  Follow.     Bilateral shoulder pain, unspecified chronicity Assessment & Plan: Was just evaluated by [facility] 01/27/23 - pain in left lateral neck with radiation to the left trapezius ridge to the shoulder.  MRI cervical spine without contrast from [place] dated 10/01/2018 for imaging and  report reviewed today. C1-2 noted facet collapse and spurring with sclerosis and right C2 impingement likely. C2-3 facet degenerative changes with disc bulging no stenosis. C3-4 disc desiccation with mild bilateral foraminal stenosis. No central stenosis. C4-5 disc collapse with severe central stenosis and noted bilateral foraminal stenosis. C5-6 disc collapse and  endplate ridging with severe central stenosis and cord flattening with T2 hyperintensity likely reflecting myelomalacia. C6-7 to space narrowing without stenosis. C7-T1 degenerative facet arthritis without stenosis. Recommended continuing tramadol prn.  Recommended NSU f/u and order placed for ESI.   Essential hypertension Assessment & Plan: Continue triam/hctz and amlodipine.  Follow pressures.  Follow metabolic panel.    Hyperglycemia Assessment & Plan: Low carb diet and exercise.  Follow met b and a1c.     Mixed hyperlipidemia Assessment & Plan: On lipitor.  Low cholesterol diet and exercise.  Follow lipid panel and liver function tests.     Sleep difficulties Assessment & Plan: Sleeping better now on no medication.  Follow.    Skin lesion Assessment & Plan: Skin lesion - leg.  Request referral back to dermatology (Dr Roseanne Kaufman / Dr Adolphus Birchwood).   Orders: -     Ambulatory referral to Dermatology     Dale Greenwater, MD

## 2023-02-01 ENCOUNTER — Encounter: Payer: Self-pay | Admitting: Internal Medicine

## 2023-02-01 DIAGNOSIS — L989 Disorder of the skin and subcutaneous tissue, unspecified: Secondary | ICD-10-CM | POA: Insufficient documentation

## 2023-02-01 NOTE — Assessment & Plan Note (Signed)
Continue atorvastatin

## 2023-02-01 NOTE — Assessment & Plan Note (Signed)
Overall appears to be doing better as outlined. Off zoloft. Notify me if feels needs any further intervention.  Follow.

## 2023-02-01 NOTE — Assessment & Plan Note (Signed)
Continue atorvastatin and aspirin ?

## 2023-02-01 NOTE — Addendum Note (Signed)
Addended by: Charm Barges on: 02/01/2023 11:05 AM   Modules accepted: Orders

## 2023-02-01 NOTE — Assessment & Plan Note (Signed)
On lipitor.  Low cholesterol diet and exercise.  Follow lipid panel and liver function tests.   

## 2023-02-01 NOTE — Assessment & Plan Note (Signed)
Continue triam/hctz and amlodipine.  Follow pressures.  Follow metabolic panel.  °

## 2023-02-01 NOTE — Assessment & Plan Note (Signed)
Off zoloft. Doing better.  Feeling better.  Her dog (Rosie) helping.  Notify me if feels needs further intervention.  Follow.

## 2023-02-01 NOTE — Assessment & Plan Note (Signed)
Low carb diet and exercise.  Follow met b and a1c.   

## 2023-02-01 NOTE — Assessment & Plan Note (Signed)
Discussed.  Off zoloft.  Her dog is helping.  Follow.  Appears to be doing better.   

## 2023-02-01 NOTE — Assessment & Plan Note (Signed)
Skin lesion - leg.  Request referral back to dermatology (Dr Roseanne Kaufman / Dr Adolphus Birchwood).

## 2023-02-01 NOTE — Assessment & Plan Note (Signed)
Sleeping better now on no medication.  Follow.

## 2023-02-01 NOTE — Assessment & Plan Note (Signed)
Continue to avoid antiinflammatories.  Stay hydrated.  Follow metabolic panel.  

## 2023-02-01 NOTE — Assessment & Plan Note (Signed)
Was just evaluated by [facility] 01/27/23 - pain in left lateral neck with radiation to the left trapezius ridge to the shoulder.  MRI cervical spine without contrast from [place] dated 10/01/2018 for imaging and report reviewed today. C1-2 noted facet collapse and spurring with sclerosis and right C2 impingement likely. C2-3 facet degenerative changes with disc bulging no stenosis. C3-4 disc desiccation with mild bilateral foraminal stenosis. No central stenosis. C4-5 disc collapse with severe central stenosis and noted bilateral foraminal stenosis. C5-6 disc collapse and endplate ridging with severe central stenosis and cord flattening with T2 hyperintensity likely reflecting myelomalacia. C6-7 to space narrowing without stenosis. C7-T1 degenerative facet arthritis without stenosis. Recommended continuing tramadol prn.  Recommended NSU f/u and order placed for ESI.

## 2023-03-16 ENCOUNTER — Other Ambulatory Visit: Payer: Self-pay | Admitting: Cardiovascular Disease

## 2023-03-16 DIAGNOSIS — I10 Essential (primary) hypertension: Secondary | ICD-10-CM

## 2023-03-18 ENCOUNTER — Encounter (INDEPENDENT_AMBULATORY_CARE_PROVIDER_SITE_OTHER): Payer: Self-pay

## 2023-03-30 ENCOUNTER — Other Ambulatory Visit (INDEPENDENT_AMBULATORY_CARE_PROVIDER_SITE_OTHER): Payer: Medicare PPO

## 2023-03-30 DIAGNOSIS — I10 Essential (primary) hypertension: Secondary | ICD-10-CM

## 2023-03-30 DIAGNOSIS — E782 Mixed hyperlipidemia: Secondary | ICD-10-CM

## 2023-03-30 DIAGNOSIS — R739 Hyperglycemia, unspecified: Secondary | ICD-10-CM

## 2023-03-30 LAB — BASIC METABOLIC PANEL
BUN: 24 mg/dL — ABNORMAL HIGH (ref 6–23)
CO2: 30 mEq/L (ref 19–32)
Calcium: 10.1 mg/dL (ref 8.4–10.5)
Chloride: 100 mEq/L (ref 96–112)
Creatinine, Ser: 0.97 mg/dL (ref 0.40–1.20)
GFR: 52.42 mL/min — ABNORMAL LOW (ref >60.00)
Glucose, Bld: 79 mg/dL (ref 70–99)
Potassium: 3.5 mEq/L (ref 3.5–5.1)
Sodium: 137 mEq/L (ref 135–145)

## 2023-03-30 LAB — HEPATIC FUNCTION PANEL
ALT: 15 U/L (ref 0–35)
AST: 17 U/L (ref 0–37)
Albumin: 4.1 g/dL (ref 3.5–5.2)
Alkaline Phosphatase: 63 U/L (ref 39–117)
Bilirubin, Direct: 0.1 mg/dL (ref 0.0–0.3)
Total Bilirubin: 0.7 mg/dL (ref 0.2–1.2)
Total Protein: 6.3 g/dL (ref 6.0–8.3)

## 2023-03-30 LAB — LIPID PANEL
Cholesterol: 132 mg/dL (ref 0–200)
HDL: 48.4 mg/dL (ref >39.00)
LDL Cholesterol: 64 mg/dL (ref 0–99)
NonHDL: 83.34
Total CHOL/HDL Ratio: 3
Triglycerides: 96 mg/dL (ref 0.0–149.0)
VLDL: 19.2 mg/dL (ref 0.0–40.0)

## 2023-03-30 LAB — HEMOGLOBIN A1C: Hgb A1c MFr Bld: 5.9 % (ref 4.6–6.5)

## 2023-03-31 ENCOUNTER — Telehealth: Payer: Self-pay | Admitting: Internal Medicine

## 2023-03-31 DIAGNOSIS — M5412 Radiculopathy, cervical region: Secondary | ICD-10-CM | POA: Diagnosis not present

## 2023-03-31 DIAGNOSIS — M4802 Spinal stenosis, cervical region: Secondary | ICD-10-CM | POA: Diagnosis not present

## 2023-03-31 NOTE — Telephone Encounter (Signed)
Pt. On recall list doesn't want to f/u

## 2023-03-31 NOTE — Telephone Encounter (Signed)
Noted  

## 2023-04-03 ENCOUNTER — Ambulatory Visit: Payer: Medicare PPO | Admitting: Internal Medicine

## 2023-04-03 VITALS — BP 124/70 | HR 87 | Temp 97.9°F | Resp 16 | Ht 62.0 in | Wt 132.0 lb

## 2023-04-03 DIAGNOSIS — D649 Anemia, unspecified: Secondary | ICD-10-CM | POA: Diagnosis not present

## 2023-04-03 DIAGNOSIS — I700 Atherosclerosis of aorta: Secondary | ICD-10-CM

## 2023-04-03 DIAGNOSIS — R739 Hyperglycemia, unspecified: Secondary | ICD-10-CM | POA: Diagnosis not present

## 2023-04-03 DIAGNOSIS — R296 Repeated falls: Secondary | ICD-10-CM

## 2023-04-03 DIAGNOSIS — M25511 Pain in right shoulder: Secondary | ICD-10-CM | POA: Diagnosis not present

## 2023-04-03 DIAGNOSIS — W19XXXA Unspecified fall, initial encounter: Secondary | ICD-10-CM

## 2023-04-03 DIAGNOSIS — F419 Anxiety disorder, unspecified: Secondary | ICD-10-CM

## 2023-04-03 DIAGNOSIS — N1831 Chronic kidney disease, stage 3a: Secondary | ICD-10-CM

## 2023-04-03 DIAGNOSIS — I739 Peripheral vascular disease, unspecified: Secondary | ICD-10-CM

## 2023-04-03 DIAGNOSIS — E782 Mixed hyperlipidemia: Secondary | ICD-10-CM | POA: Diagnosis not present

## 2023-04-03 DIAGNOSIS — I10 Essential (primary) hypertension: Secondary | ICD-10-CM | POA: Diagnosis not present

## 2023-04-03 DIAGNOSIS — F339 Major depressive disorder, recurrent, unspecified: Secondary | ICD-10-CM

## 2023-04-03 DIAGNOSIS — L989 Disorder of the skin and subcutaneous tissue, unspecified: Secondary | ICD-10-CM

## 2023-04-03 DIAGNOSIS — F338 Other recurrent depressive disorders: Secondary | ICD-10-CM

## 2023-04-03 MED ORDER — SERTRALINE HCL 25 MG PO TABS: 25.0000 mg | ORAL_TABLET | Freq: Every day | ORAL | 0 refills | Status: DC

## 2023-04-03 NOTE — Progress Notes (Unsigned)
Subjective:    Patient ID: Alicia Mccann, female    DOB: 1933/03/10, 89 y.o.   MRN: [id number]  Patient here for  Chief Complaint  Patient presents with   Medical Management of Chronic Issues    HPI Here to follow up regarding increased stress and anxiety. Was just evaluated by [facility] 01/27/23 - pain in left lateral neck with radiation to the left trapezius ridge to the shoulder.  MRI cervical spine without contrast from [place] dated 10/01/2018 for imaging and report reviewed today. C1-2 noted facet collapse and spurring with sclerosis and right C2 impingement likely. C2-3 facet degenerative changes with disc bulging no stenosis. C3-4 disc desiccation with mild bilateral foraminal stenosis. No central stenosis. C4-5 disc collapse with severe central stenosis and noted bilateral foraminal stenosis. C5-6 disc collapse and endplate ridging with severe central stenosis and cord flattening with T2 hyperintensity likely reflecting myelomalacia. C6-7 to space narrowing without stenosis. C7-T1 degenerative facet arthritis without stenosis. Recommended continuing tramadol prn. Rarely takes.  Recommended NSU f/u.  Saw Dr Myer Haff 03/31/23 - s/p ESI C4-5.  Neck doing better.  She is accompanied today by a friend [employer].  Ms Marylyn Ishihara is planning to stay with her (part time) a couple of days per week.  She has another friend who is staying with her as well.  Daughter connected in via phone - to discuss. We discussed her living situation.  She is not eating regular meals.  States she thinks - I will fix something later and then does not eat.  Weight is stable.  She has had a few falls.  No significant injuries.  Discussed Life Alert.  Discussed getting help in the home.  No chest pain or sob reported.  No abdominal pain or bowel change reported.  Persistent skin lesion.  Request referral to dermatology.    Past Medical History:  Diagnosis Date   Allergic rhinitis    Anemia    Anxiety     Cellulitis    Cholelithiasis    Essential hypertension    History of stress test    a. 12/2008 Ex MV: EF 78%, no ischemia.   Insomnia    Osteoarthritis    Tachycardia    Valvular heart disease    a. 12/2008 Echo: EF 65%, mild MR;  b. 2/6 SEM RUSB - insignificant murmur, prob Ao Sclerosis.   Past Surgical History:  Procedure Laterality Date   BACK SURGERY     BUNIONECTOMY     CATARACT EXTRACTION W/PHACO Right 05/18/2019   Procedure: CATARACT EXTRACTION PHACO AND INTRAOCULAR LENS PLACEMENT (IOC) RIGHT MALYUGIN  01:06.5  20.6%  13.72;  Surgeon: Lockie Mola, MD;  Location: [facility];  Service: Ophthalmology;  Laterality: Right;  please leave patient arrival 10   CATARACT EXTRACTION W/PHACO Left 06/08/2019   Procedure: CATARACT EXTRACTION PHACO AND INTRAOCULAR LENS PLACEMENT (IOC) LEFT  00:51.0  20.3%  10.37;  Surgeon: Lockie Mola, MD;  Location: [facility];  Service: Ophthalmology;  Laterality: Left;  ARRIVAL 10:30 PLEASE LEAVE   CHOLECYSTECTOMY  03/29/2007   GALLBLADDER SURGERY     HIP SURGERY     bilateral    REPLACEMENT TOTAL KNEE Right    THUMB ARTHROSCOPY     Family History  Problem Relation Age of Onset   Heart attack Father    Stroke Father    Heart disease Father    CAD Father    Heart attack Son 74       MI  Hypertension Son    Hypertension Mother    Heart disease Mother    Stroke Mother    Breast cancer Neg Hx    Social History   Socioeconomic History   Marital status: Widowed    Spouse name: Not on file   Number of children: Not on file   Years of education: Not on file   Highest education level: Not on file  Occupational History   Not on file  Tobacco Use   Smoking status: Never   Smokeless tobacco: Never  Vaping Use   Vaping status: Never Used  Substance and Sexual Activity   Alcohol use: Yes    Comment: 2 oz QOD   Drug use: No   Sexual activity: Not Currently  Other Topics Concern   Not on file  Social History  Narrative   Not on file   Social Determinants of Health   Financial Resource Strain: Low Risk  (10/04/2021)   Overall Financial Resource Strain (CARDIA)    Difficulty of Paying Living Expenses: Not hard at all  Food Insecurity: No Food Insecurity (10/04/2021)   Hunger Vital Sign    Worried About Running Out of Food in the Last Year: Never true    Ran Out of Food in the Last Year: Never true  Transportation Needs: No Transportation Needs (10/04/2021)   PRAPARE - Administrator, Civil Service (Medical): No    Lack of Transportation (Non-Medical): No  Physical Activity: Insufficiently Active (10/04/2021)   Exercise Vital Sign    Days of Exercise per Week: 4 days    Minutes of Exercise per Session: 20 min  Stress: No Stress Concern Present (10/04/2021)   [employer] of Occupational Health - Occupational Stress Questionnaire    Feeling of Stress : Not at all  Social Connections: Unknown (10/04/2021)   Social Connection and Isolation Panel [NHANES]    Frequency of Communication with Friends and Family: Not on file    Frequency of Social Gatherings with Friends and Family: Not on file    Attends Religious Services: Not on file    Active Member of Clubs or Organizations: Not on file    Attends Banker Meetings: Not on file    Marital Status: Widowed     Review of Systems  Constitutional:  Negative for appetite change and unexpected weight change.  HENT:  Negative for congestion and sinus pressure.   Respiratory:  Negative for cough, chest tightness and shortness of breath.   Cardiovascular:  Negative for chest pain, palpitations and leg swelling.  Gastrointestinal:  Negative for abdominal pain, diarrhea, nausea and vomiting.  Genitourinary:  Negative for difficulty urinating and dysuria.  Musculoskeletal:  Negative for joint swelling and myalgias.       Neck doing better.   Skin:  Negative for color change and rash.  Neurological:  Negative for dizziness  and headaches.  Psychiatric/Behavioral:  Negative for agitation and dysphoric mood.        Objective:     BP 124/70   Pulse 87   Temp 97.9 F (36.6 C)   Resp 16   Ht 5\' 2"  (1.575 m)   Wt 132 lb (59.9 kg)   SpO2 98%   BMI 24.14 kg/m  Wt Readings from Last 3 Encounters:  04/03/23 132 lb (59.9 kg)  01/30/23 133 lb (60.3 kg)  01/13/23 137 lb (62.1 kg)    Physical Exam Vitals reviewed.  Constitutional:      General: She is not  in acute distress.    Appearance: Normal appearance.  HENT:     Head: Normocephalic and atraumatic.     Right Ear: External ear normal.     Left Ear: External ear normal.  Eyes:     General: No scleral icterus.       Right eye: No discharge.        Left eye: No discharge.     Conjunctiva/sclera: Conjunctivae normal.  Neck:     Thyroid: No thyromegaly.  Cardiovascular:     Rate and Rhythm: Normal rate and regular rhythm.  Pulmonary:     Effort: No respiratory distress.     Breath sounds: Normal breath sounds. No wheezing.  Abdominal:     General: Bowel sounds are normal.     Palpations: Abdomen is soft.     Tenderness: There is no abdominal tenderness.  Musculoskeletal:        General: No swelling or tenderness.     Cervical back: Neck supple. No tenderness.  Lymphadenopathy:     Cervical: No cervical adenopathy.  Skin:    Findings: No erythema or rash.  Neurological:     Mental Status: She is alert.  Psychiatric:        Mood and Affect: Mood normal.        Behavior: Behavior normal.      Outpatient Encounter Medications as of 04/03/2023  Medication Sig   sertraline (ZOLOFT) 25 MG tablet Take 1 tablet (25 mg total) by mouth daily.   acetaminophen (TYLENOL) 500 MG tablet Take 1,000 mg by mouth every 8 (eight) hours as needed for mild pain or moderate pain.   ALPRAZolam (XANAX) 0.25 MG tablet TAKE 1 TABLET BY MOUTH DAILY AS NEEDED FOR ANXIETY   amLODipine (NORVASC) 2.5 MG tablet TAKE ONE TABLET BY MOUTH EVERY DAY   aspirin 81 MG EC  tablet Take 81 mg by mouth daily. Swallow whole.   atorvastatin (LIPITOR) 10 MG tablet TAKE ONE TABLET BY MOUTH EVERY DAY   cholecalciferol (VITAMIN D) 1000 UNITS tablet Take 1,000 Units by mouth every other day.    cyanocobalamin 100 MCG tablet Take 100 mcg by mouth daily.   triamterene-hydrochlorothiazide (MAXZIDE-25) 37.5-25 MG tablet TAKE ONE TABLET BY MOUTH EVERY DAY   zolpidem (AMBIEN) 5 MG tablet Take 1 tablet (5 mg total) by mouth at bedtime as needed for sleep.   No facility-administered encounter medications on file as of 04/03/2023.     Lab Results  Component Value Date   WBC 9.1 11/06/2022   HGB 12.7 11/06/2022   HCT 38.9 11/06/2022   PLT 335.0 11/06/2022   GLUCOSE 79 03/30/2023   CHOL 132 03/30/2023   TRIG 96.0 03/30/2023   HDL 48.40 03/30/2023   LDLDIRECT 66.0 09/07/2018   LDLCALC 64 03/30/2023   ALT 15 03/30/2023   AST 17 03/30/2023   NA 137 03/30/2023   K 3.5 03/30/2023   CL 100 03/30/2023   CREATININE 0.97 03/30/2023   BUN 24 (H) 03/30/2023   CO2 30 03/30/2023   TSH 3.74 05/20/2022   INR 0.9 01/16/2009   HGBA1C 5.9 03/30/2023    No results found.     Assessment & Plan:  Falls frequently -     AMB Referral to Community Care Coordinaton (ACO Patients)  Essential hypertension Assessment & Plan: Continue triam/hctz and amlodipine.  Follow pressures.  Follow metabolic panel.    Depression, recurrent (HCC) Assessment & Plan: Discussed.  Her dog is helping.  Discussed her increased anxiety/stress.  She has a couple of friends to stay with her. Will restart low dose zoloft 25mg  q day. Follow.     Aortic atherosclerosis (HCC) Assessment & Plan: Continue atorvastatin.    Anxiety Assessment & Plan: Restart zoloft as outlined.  Has a couple  of friends staying with her.  This helps.  Follow.    Anemia, unspecified type Assessment & Plan: Follow cbc.    Bilateral shoulder pain, unspecified chronicity Assessment & Plan: Was just evaluated by  [facility] 01/27/23 - pain in left lateral neck with radiation to the left trapezius ridge to the shoulder.  MRI cervical spine without contrast from [place] dated 10/01/2018 for imaging and report reviewed today. C1-2 noted facet collapse and spurring with sclerosis and right C2 impingement likely. C2-3 facet degenerative changes with disc bulging no stenosis. C3-4 disc desiccation with mild bilateral foraminal stenosis. No central stenosis. C4-5 disc collapse with severe central stenosis and noted bilateral foraminal stenosis. C5-6 disc collapse and endplate ridging with severe central stenosis and cord flattening with T2 hyperintensity likely reflecting myelomalacia. C6-7 to space narrowing without stenosis. C7-T1 degenerative facet arthritis without stenosis. Recommended continuing tramadol prn. Rarely takes.  Recommended NSU f/u.  Saw Dr Myer Haff 03/31/23 - s/p ESI C4-5.  Neck doing better.    Stage 3a chronic kidney disease (HCC) Assessment & Plan: Continue to avoid antiinflammatories.  Stay hydrated.  Follow metabolic panel.    Hyperglycemia Assessment & Plan: Low carb diet and exercise.  Follow met b and a1c.     Mixed hyperlipidemia Assessment & Plan: On lipitor.  Low cholesterol diet and exercise.  Follow lipid panel and liver function tests.     PAD (peripheral artery disease) (HCC) Assessment & Plan: Continue atorvastatin and aspirin.     Seasonal affective disorder [facility]) Assessment & Plan: Discussed today.  Will start zoloft.  Friends helping.  Her dog [facility]) has helped.  Follow.     Fall, initial encounter Assessment & Plan: Has had a few falls recently.  No significant injury.  Discussed falls.  Discussed living situation.  Not eating regular meals.  Has two friends willing to come and help her a few days per week.  Discussed need for further intervention.  CCM referral. Pt agreeable.    Skin lesion Assessment & Plan: Persistent raised lesion.  Request referral to  dermatology.   Orders: -     Ambulatory referral to Dermatology  Other orders -     Sertraline HCl; Take 1 tablet (25 mg total) by mouth daily.  Dispense: 30 tablet; Refill: 0   I spent 45 minutes with the patient.  Time spent discussing her current concerns and symptoms.  Specifically time spent discussing her living situation and falls. Time also spent discussing further w/up, evaluation and treatment.    Dale [place], MD

## 2023-04-04 ENCOUNTER — Encounter: Payer: Self-pay | Admitting: Internal Medicine

## 2023-04-04 DIAGNOSIS — W19XXXA Unspecified fall, initial encounter: Secondary | ICD-10-CM | POA: Insufficient documentation

## 2023-04-04 NOTE — Assessment & Plan Note (Signed)
Was just evaluated by [facility] 01/27/23 - pain in left lateral neck with radiation to the left trapezius ridge to the shoulder.  MRI cervical spine without contrast from [place] dated 10/01/2018 for imaging and report reviewed today. C1-2 noted facet collapse and spurring with sclerosis and right C2 impingement likely. C2-3 facet degenerative changes with disc bulging no stenosis. C3-4 disc desiccation with mild bilateral foraminal stenosis. No central stenosis. C4-5 disc collapse with severe central stenosis and noted bilateral foraminal stenosis. C5-6 disc collapse and endplate ridging with severe central stenosis and cord flattening with T2 hyperintensity likely reflecting myelomalacia. C6-7 to space narrowing without stenosis. C7-T1 degenerative facet arthritis without stenosis. Recommended continuing tramadol prn. Rarely takes.  Recommended NSU f/u.  Saw Dr Myer Haff 03/31/23 - s/p ESI C4-5.  Neck doing better.

## 2023-04-04 NOTE — Assessment & Plan Note (Signed)
Has had a few falls recently.  No significant injury.  Discussed falls.  Discussed living situation.  Not eating regular meals.  Has two friends willing to come and help her a few days per week.  Discussed need for further intervention.  CCM referral. Pt agreeable.

## 2023-04-04 NOTE — Assessment & Plan Note (Signed)
Low carb diet and exercise.  Follow met b and a1c.   

## 2023-04-04 NOTE — Assessment & Plan Note (Signed)
Continue atorvastatin and aspirin ?

## 2023-04-04 NOTE — Assessment & Plan Note (Signed)
Discussed.  Her dog is helping.  Discussed her increased anxiety/stress.  She has a couple of friends to stay with her. Will restart low dose zoloft 25mg  q day. Follow.

## 2023-04-04 NOTE — Assessment & Plan Note (Signed)
On lipitor.  Low cholesterol diet and exercise.  Follow lipid panel and liver function tests.   

## 2023-04-04 NOTE — Assessment & Plan Note (Signed)
Continue triam/hctz and amlodipine.  Follow pressures.  Follow metabolic panel.  

## 2023-04-04 NOTE — Assessment & Plan Note (Signed)
Continue to avoid antiinflammatories.  Stay hydrated.  Follow metabolic panel.  

## 2023-04-04 NOTE — Assessment & Plan Note (Signed)
Follow cbc.  

## 2023-04-04 NOTE — Assessment & Plan Note (Signed)
Discussed today.  Will start zoloft.  Friends helping.  Her dog [facility]) has helped.  Follow.

## 2023-04-04 NOTE — Assessment & Plan Note (Addendum)
Restart zoloft as outlined.  Has a couple  of friends staying with her.  This helps.  Follow.

## 2023-04-04 NOTE — Assessment & Plan Note (Signed)
Continue atorvastatin

## 2023-04-04 NOTE — Assessment & Plan Note (Signed)
Persistent raised lesion.  Request referral to dermatology.

## 2023-04-06 ENCOUNTER — Telehealth: Payer: Self-pay | Admitting: *Deleted

## 2023-04-06 NOTE — Progress Notes (Signed)
  Care Coordination   Note   04/06/2023 Name: Alicia Mccann MRN: [id number] DOB: 11/09/1932  Alicia Mccann is a 89 y.o. year old female who sees Dale Kirkland, MD for primary care. I reached out to Lula Olszewski by phone today to offer care coordination services.  [name] was given information about Care Coordination services today including:   The Care Coordination services include support from the care team which includes your Nurse Coordinator, Clinical Social Worker, or Pharmacist.  The Care Coordination team is here to help remove barriers to the health concerns and goals most important to you. Care Coordination services are voluntary, and the patient may decline or stop services at any time by request to their care team member.   Care Coordination Consent Status: Patient did not agree to participate in care coordination services at this time.  Follow up plan:  Pt appreciative of referral but says she has someone starting tomorrow to come help with making her home safer - will be coming 3 days weekly - also supposed to be getting life alert mailed sent to her - contact info given if pt needs services in the future   Encounter Outcome:  Pt. Refused  Burman Nieves, CCMA Care Coordination Care Guide Direct Dial: [phone]

## 2023-04-09 ENCOUNTER — Telehealth: Payer: Self-pay | Admitting: Internal Medicine

## 2023-04-09 NOTE — Telephone Encounter (Signed)
Alicia Mccann (caregiver) came into the office to drop off handicap from for provider to fill out. Placed in provider folder

## 2023-04-09 NOTE — Telephone Encounter (Signed)
Received and placed in quick sign folder.

## 2023-04-13 NOTE — Telephone Encounter (Signed)
Signed and placed in box.   

## 2023-04-15 NOTE — Telephone Encounter (Signed)
Pt aware and placed up front for pick up.

## 2023-05-04 ENCOUNTER — Other Ambulatory Visit: Payer: Self-pay | Admitting: Internal Medicine

## 2023-05-28 ENCOUNTER — Encounter: Payer: Self-pay | Admitting: Internal Medicine

## 2023-05-28 ENCOUNTER — Telehealth: Payer: Self-pay | Admitting: *Deleted

## 2023-05-28 ENCOUNTER — Ambulatory Visit: Payer: Medicare PPO | Admitting: Internal Medicine

## 2023-05-28 VITALS — BP 126/68 | HR 88 | Temp 98.0°F | Resp 16 | Ht 62.0 in | Wt 126.6 lb

## 2023-05-28 DIAGNOSIS — I10 Essential (primary) hypertension: Secondary | ICD-10-CM | POA: Diagnosis not present

## 2023-05-28 DIAGNOSIS — R739 Hyperglycemia, unspecified: Secondary | ICD-10-CM | POA: Diagnosis not present

## 2023-05-28 DIAGNOSIS — I700 Atherosclerosis of aorta: Secondary | ICD-10-CM

## 2023-05-28 DIAGNOSIS — F419 Anxiety disorder, unspecified: Secondary | ICD-10-CM | POA: Diagnosis not present

## 2023-05-28 DIAGNOSIS — F339 Major depressive disorder, recurrent, unspecified: Secondary | ICD-10-CM

## 2023-05-28 DIAGNOSIS — Z23 Encounter for immunization: Secondary | ICD-10-CM

## 2023-05-28 DIAGNOSIS — I739 Peripheral vascular disease, unspecified: Secondary | ICD-10-CM | POA: Diagnosis not present

## 2023-05-28 DIAGNOSIS — M79674 Pain in right toe(s): Secondary | ICD-10-CM

## 2023-05-28 DIAGNOSIS — N1831 Chronic kidney disease, stage 3a: Secondary | ICD-10-CM | POA: Diagnosis not present

## 2023-05-28 NOTE — Progress Notes (Signed)
Care Coordination  Outreach Note  05/28/2023 Name: Alicia Mccann MRN: [id number] DOB: 03-Mar-1933   Care Coordination Outreach Attempts: An unsuccessful telephone outreach was attempted today to offer the patient information about available care coordination services.  Follow Up Plan:  Additional outreach attempts will be made to offer the patient care coordination information and services.   Encounter Outcome:  No Answer  Burman Nieves, CCMA Care Coordination Care Guide Direct Dial: [phone]

## 2023-05-28 NOTE — Progress Notes (Signed)
Subjective:    Patient ID: Alicia Mccann, female    DOB: 11/07/1932, 89 y.o.   MRN: [id number]  Patient here for  Chief Complaint  Patient presents with   Medical Management of Chronic Issues    HPI Here to follow up regarding increased stress and anxiety. Was supposed to restart zoloft last visit. Initially reported - not feel helping.  On further questioning, she is not taking the medication.  She is accompanied by her friend and part time caretaker - Alona Bene.  Increased stress.  Anxious.  Some depression.  Discussed her living situation.  Discussed need for help in the home.  She is not eating regular meals.  Unclear how she is taking her medication.  Had discussed with her previously regarding CCM referral for evaluation and assistance in the home.  She declined services when they contacted her.  Discussed again today.  Discussed assisted living.  She is agreeable for home services.  No chest pain reported.  Breathing stable.  Toe pain.  Request referral to podiatry.    Past Medical History:  Diagnosis Date   Allergic rhinitis    Anemia    Anxiety    Cellulitis    Cholelithiasis    Essential hypertension    History of stress test    a. 12/2008 Ex MV: EF 78%, no ischemia.   Insomnia    Osteoarthritis    Tachycardia    Valvular heart disease    a. 12/2008 Echo: EF 65%, mild MR;  b. 2/6 SEM RUSB - insignificant murmur, prob Ao Sclerosis.   Past Surgical History:  Procedure Laterality Date   BACK SURGERY     BUNIONECTOMY     CATARACT EXTRACTION W/PHACO Right 05/18/2019   Procedure: CATARACT EXTRACTION PHACO AND INTRAOCULAR LENS PLACEMENT (IOC) RIGHT MALYUGIN  01:06.5  20.6%  13.72;  Surgeon: Lockie Mola, MD;  Location: [facility];  Service: Ophthalmology;  Laterality: Right;  please leave patient arrival 10   CATARACT EXTRACTION W/PHACO Left 06/08/2019   Procedure: CATARACT EXTRACTION PHACO AND INTRAOCULAR LENS PLACEMENT (IOC) LEFT  00:51.0  20.3%  10.37;   Surgeon: Lockie Mola, MD;  Location: [facility];  Service: Ophthalmology;  Laterality: Left;  ARRIVAL 10:30 PLEASE LEAVE   CHOLECYSTECTOMY  03/29/2007   GALLBLADDER SURGERY     HIP SURGERY     bilateral    REPLACEMENT TOTAL KNEE Right    THUMB ARTHROSCOPY     Family History  Problem Relation Age of Onset   Heart attack Father    Stroke Father    Heart disease Father    CAD Father    Heart attack Son 71       MI   Hypertension Son    Hypertension Mother    Heart disease Mother    Stroke Mother    Breast cancer Neg Hx    Social History   Socioeconomic History   Marital status: Widowed    Spouse name: Not on file   Number of children: Not on file   Years of education: Not on file   Highest education level: Not on file  Occupational History   Not on file  Tobacco Use   Smoking status: Never   Smokeless tobacco: Never  Vaping Use   Vaping status: Never Used  Substance and Sexual Activity   Alcohol use: Yes    Comment: 2 oz QOD   Drug use: No   Sexual activity: Not Currently  Other Topics Concern  Not on file  Social History Narrative   Not on file   Social Determinants of Health   Financial Resource Strain: Low Risk  (10/04/2021)   Overall Financial Resource Strain (CARDIA)    Difficulty of Paying Living Expenses: Not hard at all  Food Insecurity: No Food Insecurity (10/04/2021)   Hunger Vital Sign    Worried About Running Out of Food in the Last Year: Never true    Ran Out of Food in the Last Year: Never true  Transportation Needs: No Transportation Needs (10/04/2021)   PRAPARE - Administrator, Civil Service (Medical): No    Lack of Transportation (Non-Medical): No  Physical Activity: Insufficiently Active (10/04/2021)   Exercise Vital Sign    Days of Exercise per Week: 4 days    Minutes of Exercise per Session: 20 min  Stress: No Stress Concern Present (10/04/2021)   [employer] of Occupational Health - Occupational Stress  Questionnaire    Feeling of Stress : Not at all  Social Connections: Unknown (10/04/2021)   Social Connection and Isolation Panel [NHANES]    Frequency of Communication with Friends and Family: Not on file    Frequency of Social Gatherings with Friends and Family: Not on file    Attends Religious Services: Not on file    Active Member of Clubs or Organizations: Not on file    Attends Banker Meetings: Not on file    Marital Status: Widowed     Review of Systems  Constitutional:        Weight is down. Not eating as well.   HENT:  Negative for congestion and sinus pressure.   Respiratory:  Negative for cough, chest tightness and shortness of breath.   Cardiovascular:  Negative for chest pain and palpitations.  Gastrointestinal:  Negative for abdominal pain, diarrhea, nausea and vomiting.  Genitourinary:  Negative for difficulty urinating and dysuria.  Musculoskeletal:  Negative for myalgias and neck pain.  Skin:  Negative for color change and rash.  Neurological:  Negative for dizziness and headaches.  Psychiatric/Behavioral:         Increased stress/depression.         Objective:     BP 126/68   Pulse 88   Temp 98 F (36.7 C)   Resp 16   Ht 5\' 2"  (1.575 m)   Wt 126 lb 9.6 oz (57.4 kg)   SpO2 98%   BMI 23.16 kg/m  Wt Readings from Last 3 Encounters:  05/28/23 126 lb 9.6 oz (57.4 kg)  04/03/23 132 lb (59.9 kg)  01/30/23 133 lb (60.3 kg)    Physical Exam Vitals reviewed.  Constitutional:      General: She is not in acute distress.    Appearance: Normal appearance.  HENT:     Head: Normocephalic and atraumatic.     Right Ear: External ear normal.     Left Ear: External ear normal.  Eyes:     General: No scleral icterus.       Right eye: No discharge.        Left eye: No discharge.     Conjunctiva/sclera: Conjunctivae normal.  Neck:     Thyroid: No thyromegaly.  Cardiovascular:     Rate and Rhythm: Normal rate and regular rhythm.  Pulmonary:      Effort: No respiratory distress.     Breath sounds: Normal breath sounds. No wheezing.  Abdominal:     General: Bowel sounds are normal.  Palpations: Abdomen is soft.     Tenderness: There is no abdominal tenderness.  Musculoskeletal:        General: No swelling or tenderness.     Cervical back: Neck supple. No tenderness.  Lymphadenopathy:     Cervical: No cervical adenopathy.  Skin:    Findings: No erythema or rash.  Neurological:     Mental Status: She is alert.  Psychiatric:        Mood and Affect: Mood normal.        Behavior: Behavior normal.      Outpatient Encounter Medications as of 05/28/2023  Medication Sig   acetaminophen (TYLENOL) 500 MG tablet Take 1,000 mg by mouth every 8 (eight) hours as needed for mild pain or moderate pain.   ALPRAZolam (XANAX) 0.25 MG tablet TAKE 1 TABLET BY MOUTH DAILY AS NEEDED FOR ANXIETY   amLODipine (NORVASC) 2.5 MG tablet TAKE ONE TABLET BY MOUTH EVERY DAY   aspirin 81 MG EC tablet Take 81 mg by mouth daily. Swallow whole.   atorvastatin (LIPITOR) 10 MG tablet TAKE ONE TABLET BY MOUTH EVERY DAY   cholecalciferol (VITAMIN D) 1000 UNITS tablet Take 1,000 Units by mouth every other day.    cyanocobalamin 100 MCG tablet Take 100 mcg by mouth daily.   sertraline (ZOLOFT) 25 MG tablet TAKE 1 TABLET BY MOUTH ONCE DAILY   triamterene-hydrochlorothiazide (MAXZIDE-25) 37.5-25 MG tablet TAKE ONE TABLET BY MOUTH EVERY DAY   zolpidem (AMBIEN) 5 MG tablet Take 1 tablet (5 mg total) by mouth at bedtime as needed for sleep.   No facility-administered encounter medications on file as of 05/28/2023.     Lab Results  Component Value Date   WBC 9.1 11/06/2022   HGB 12.7 11/06/2022   HCT 38.9 11/06/2022   PLT 335.0 11/06/2022   GLUCOSE 79 03/30/2023   CHOL 132 03/30/2023   TRIG 96.0 03/30/2023   HDL 48.40 03/30/2023   LDLDIRECT 66.0 09/07/2018   LDLCALC 64 03/30/2023   ALT 15 03/30/2023   AST 17 03/30/2023   NA 137 03/30/2023   K 3.5  03/30/2023   CL 100 03/30/2023   CREATININE 0.97 03/30/2023   BUN 24 (H) 03/30/2023   CO2 30 03/30/2023   TSH 3.74 05/20/2022   INR 0.9 01/16/2009   HGBA1C 5.9 03/30/2023    No results found.     Assessment & Plan:  Need for influenza vaccination -     Flu Vaccine Trivalent High Dose (Fluad)  Pain of toe of right foot -     Ambulatory referral to Podiatry  PAD (peripheral artery disease) (HCC) Assessment & Plan: Continue atorvastatin and aspirin.     Hyperglycemia Assessment & Plan: Follow met b and a1c.     Essential hypertension Assessment & Plan: Continue triam/hctz and amlodipine.  Follow pressures.  Follow metabolic panel.    Depression, recurrent (HCC) Assessment & Plan: Discussed.  Discussed her increased anxiety/stress.  She has a friend to stay with her part time.  Discussed CCM referral/follow up.  Agreeable. Not sure that she is taking zoloft.  D/w psychiatry regarding treatment.    Stage 3a chronic kidney disease (HCC) Assessment & Plan: Continue to avoid antiinflammatories.  Stay hydrated.  Follow metabolic panel.    Aortic atherosclerosis (HCC) Assessment & Plan: Continue atorvastatin.    Anxiety Assessment & Plan: Has a friend staying with her part time. D/w psychiatry regarding further treatment.    Disposition:   Discussed CCM referral and need to allow  them to evaluate.  Discussed assisted living.  She prefers to stay in her home.  Follow.    I spent 45 minutes with the patient. Time spent discussing her current concerns and symptoms.  Specifically time spent discussing her current living situation, anxiety and stress. Time also spent discussing further w/up, evaluation and treatment.    Dale Stevenson, MD

## 2023-05-29 NOTE — Progress Notes (Signed)
Care Coordination  Outreach Note  05/29/2023 Name: Alicia Mccann MRN: [id number] DOB: 10-04-1932   Care Coordination Outreach Attempts: A second unsuccessful outreach was attempted today to offer the patient with information about available care coordination services.  Follow Up Plan:  Additional outreach attempts will be made to offer the patient care coordination information and services.   Encounter Outcome:  No Answer  Burman Nieves, CCMA Care Coordination Care Guide Direct Dial: [phone]

## 2023-05-31 ENCOUNTER — Encounter: Payer: Self-pay | Admitting: Internal Medicine

## 2023-05-31 NOTE — Assessment & Plan Note (Signed)
Follow met b and a1c.  

## 2023-05-31 NOTE — Assessment & Plan Note (Signed)
Continue triam/hctz and amlodipine.  Follow pressures.  Follow metabolic panel.

## 2023-05-31 NOTE — Assessment & Plan Note (Signed)
Has a friend staying with her part time. D/w psychiatry regarding further treatment.

## 2023-05-31 NOTE — Assessment & Plan Note (Signed)
Continue atorvastatin and aspirin ?

## 2023-05-31 NOTE — Assessment & Plan Note (Signed)
Continue atorvastatin

## 2023-05-31 NOTE — Assessment & Plan Note (Signed)
Discussed.  Discussed her increased anxiety/stress.  She has a friend to stay with her part time.  Discussed CCM referral/follow up.  Agreeable. Not sure that she is taking zoloft.  D/w psychiatry regarding treatment.

## 2023-05-31 NOTE — Assessment & Plan Note (Signed)
Continue to avoid antiinflammatories.  Stay hydrated.  Follow metabolic panel.

## 2023-06-01 NOTE — Progress Notes (Signed)
Care Coordination   Note   06/01/2023 Name: Alicia Mccann MRN: [id number] DOB: [birth year]  Alicia Mccann is a 89 y.o. year old female who sees Dale [place], MD for primary care. I reached out to Alicia Mccann by phone today to offer care coordination services.  Alicia Mccann was given information about Care Coordination services today including:   The Care Coordination services include support from the care team which includes your Nurse Coordinator, Clinical Social Worker, or Pharmacist.  The Care Coordination team is here to help remove barriers to the health concerns and goals most important to you. Care Coordination services are voluntary, and the patient may decline or stop services at any time by request to their care team member.   Care Coordination Consent Status: Patient agreed to services and verbal consent obtained.   Follow up plan:  Telephone appointment with care coordination team member scheduled for:  06/02/2023  Encounter Outcome:  Patient Scheduled from referral   Burman Nieves, [facility] Care Coordination Care Guide Direct Dial: [phone]

## 2023-06-02 ENCOUNTER — Ambulatory Visit: Payer: Self-pay | Admitting: *Deleted

## 2023-06-02 NOTE — Patient Outreach (Addendum)
Care Coordination   Initial Visit Note   06/03/2023 Name: Alicia Mccann MRN: [id number] DOB: [birth year]  Alicia Mccann is a 89 y.o. year old female who sees Dale [place], MD for primary care. I spoke with  Lula Olszewski by phone today.  What matters to the patients health and wellness today?  Management of symptoms of anxiety. Patient discussed a long history of anxiety symptoms, reports symptoms have seemed to increase. Discussed past history of mental health treatment, declines referral at this for ongoing support-willing to consider possibility of a Adult Day Progam . Patient's caregiver present during the visit, patient gave this social worker verbal permission to speak with caregiver who would agree with proposed plan for a structured day program   Goals Addressed             This Visit's Progress    Patient Stated       Activities and task to complete in order to accomplish goals.   EMOTIONAL / MENTAL HEALTH SUPPORT Self Support options  (continue to consider increasing social activity, exercise and ongoing mental health follow up) Keep all upcoming appointment discussed today          SDOH assessments and interventions completed:  Yes  SDOH Interventions Today    Flowsheet Row Most Recent Value  SDOH Interventions   Food Insecurity Interventions Intervention Not Indicated  Housing Interventions Intervention Not Indicated  Transportation Interventions Intervention Not Indicated  Utilities Interventions Intervention Not Indicated  Depression Interventions/Treatment  Medication  Social Connections Interventions Intervention Not Indicated        Care Coordination Interventions:  Yes, provided  Interventions Today    Flowsheet Row Most Recent Value  Chronic Disease   Chronic disease during today's visit Other  [symptoms of depression, anxiety memory changes]  General Interventions   General Interventions Discussed/Reviewed General Interventions  Discussed, Community Resources, Level of Care  [Challenges with anxiety discussed, confirmed long history of anxiety, daughter assists with managing finances, in home aid assists 2x per week]  Level of Care Adult Daycare, Personal Care Services  [confirmed that patient has a friend that helps 2x per week-willing to consider Adult Day Program- contact information provided-has a long term care policy-daughter looking into this to assist with cost for additional in home care]  Mental Health Interventions   Mental Health Discussed/Reviewed Mental Health Discussed, Coping Strategies, Anxiety  [supportive counseling provided, confirmed long hx of anxietyand lacks of motivation, coping strategies discussed including  increasing social involvement, physical excercise, ongoing mental health counseling for continued development of coping strategies]  Safety Interventions   Safety Discussed/Reviewed Safety Discussed  [confirmed that patient has a med alert]       Follow up plan: Follow up call scheduled for 06/16/23    Encounter Outcome:  Patient Visit Completed

## 2023-06-03 NOTE — Patient Outreach (Deleted)
Care Coordination   Initial Visit Note   06/03/2023  Name: ROZLYN CHUBA MRN: [id number] DOB: [birth year]  AVEEN FLOCK is a 89 y.o. year old female who sees Dale [place], MD for primary care. I spoke with  Lula Olszewski by phone on 06/02/23.  What matters to the patients health and wellness today?  Management of symptoms of anxiety    Goals Addressed             This Visit's Progress    Patient Stated       Activities and task to complete in order to accomplish goals.   EMOTIONAL / MENTAL HEALTH SUPPORT Self Support options  (continue to consider increasing social activity, exercise and ongoing mental health follow up) Keep all upcoming appointment discussed today          SDOH assessments and interventions completed:  Yes  SDOH Interventions Today    Flowsheet Row Most Recent Value  SDOH Interventions   Food Insecurity Interventions Intervention Not Indicated  Housing Interventions Intervention Not Indicated  Transportation Interventions Intervention Not Indicated  Utilities Interventions Intervention Not Indicated  Depression Interventions/Treatment  Medication  Social Connections Interventions Intervention Not Indicated        Care Coordination Interventions:  Yes, provided  Interventions Today    Flowsheet Row Most Recent Value  Chronic Disease   Chronic disease during today's visit Other  [symptoms of depression, anxiety memory changes]  General Interventions   General Interventions Discussed/Reviewed General Interventions Discussed, Community Resources, Level of Care  [Challenges with anxiety discussed, confirmed long history of anxiety, daughter assists with managing finances, in home aid assists 2x per week]  Level of Care Adult Daycare, Personal Care Services  [confirmed that patient has a friend that helps 2x per week-willing to consider Adult Day Program- contact information provided-has a long term care policy-daughter looking into  this to assist with cost for additional in home care]  Mental Health Interventions   Mental Health Discussed/Reviewed Mental Health Discussed, Coping Strategies, Anxiety  [supportive counseling provided, confirmed long hx of anxietyand lacks of motivation, coping strategies discussed including  increasing social involvement, physical excercise, ongoing mental health counseling for continued development of coping strategies]  Safety Interventions   Safety Discussed/Reviewed Safety Discussed  [confirmed that patient has a med alert]       Follow up plan: Follow up call scheduled for 06/16/23    Encounter Outcome:  Patient Visit Completed

## 2023-06-03 NOTE — Patient Instructions (Signed)
Visit Information  Thank you for taking time to visit with me today. Please don't hesitate to contact me if I can be of assistance to you.   Following are the goals we discussed today:   Goals Addressed             This Visit's Progress    Patient Stated       Activities and task to complete in order to accomplish goals.   EMOTIONAL / MENTAL HEALTH SUPPORT Self Support options  (continue to consider increasing social activity, exercise and ongoing mental health follow up) Keep all upcoming appointment discussed today          Our next appointment is by telephone on 06/16/23 at 1PM  Please call the care guide team at [phone] if you need to cancel or reschedule your appointment.   If you are experiencing a Mental Health or Behavioral Health Crisis or need someone to talk to, please call the Suicide and Crisis Lifeline: 988   Patient verbalizes understanding of instructions and care plan provided today and agrees to view in MyChart. Active MyChart status and patient understanding of how to access instructions and care plan via MyChart confirmed with patient.     Telephone follow up appointment with care management team member scheduled for:06/16/23   Maevis Mumby, LCSW [place]  [facility], [facility] Licensed Clinical Social Worker Care Coordinator  Direct Dial: [phone]

## 2023-06-11 DIAGNOSIS — M79675 Pain in left toe(s): Secondary | ICD-10-CM | POA: Diagnosis not present

## 2023-06-11 DIAGNOSIS — B351 Tinea unguium: Secondary | ICD-10-CM | POA: Diagnosis not present

## 2023-06-11 DIAGNOSIS — L600 Ingrowing nail: Secondary | ICD-10-CM | POA: Diagnosis not present

## 2023-06-11 DIAGNOSIS — M898X9 Other specified disorders of bone, unspecified site: Secondary | ICD-10-CM | POA: Diagnosis not present

## 2023-06-11 DIAGNOSIS — M79674 Pain in right toe(s): Secondary | ICD-10-CM | POA: Diagnosis not present

## 2023-06-11 DIAGNOSIS — M2041 Other hammer toe(s) (acquired), right foot: Secondary | ICD-10-CM | POA: Diagnosis not present

## 2023-06-16 ENCOUNTER — Other Ambulatory Visit: Payer: Self-pay | Admitting: Cardiovascular Disease

## 2023-06-16 ENCOUNTER — Encounter: Payer: Self-pay | Admitting: *Deleted

## 2023-06-16 ENCOUNTER — Telehealth: Payer: Self-pay | Admitting: *Deleted

## 2023-06-16 DIAGNOSIS — I10 Essential (primary) hypertension: Secondary | ICD-10-CM

## 2023-06-16 NOTE — Patient Outreach (Signed)
Care Coordination   06/16/2023 Name: Alicia Mccann MRN: [id number] DOB: [birth year]   Care Coordination Outreach Attempts:  An unsuccessful telephone outreach was attempted for a scheduled appointment today.  Follow Up Plan:  Additional outreach attempts will be made to offer the patient care coordination information and services.   Encounter Outcome:  No Answer   Care Coordination Interventions:  No, not indicated    Saxon Barich, LCSW [place]  [facility], [facility] Licensed Clinical Social Worker Care Coordinator  Direct Dial: [phone]

## 2023-06-18 ENCOUNTER — Telehealth: Payer: Self-pay | Admitting: *Deleted

## 2023-06-18 NOTE — Patient Outreach (Signed)
Care Coordination   Follow Up Visit Note   06/18/2023 Name: Alicia Mccann MRN: [id number] DOB: October 02, 1932  Alicia Mccann is a 89 y.o. year old female who sees Dale , MD for primary care. I engaged with Alicia Mccann in the providers office today.  What matters to the patients health and wellness today?  Management of symptoms of anxiety.  In home care assistance options   Goals Addressed             This Visit's Progress    community resource needs       Activities and task to complete in order to accomplish goals.   EMOTIONAL / MENTAL HEALTH SUPPORT Self Support options  (continue to consider increasing social activity, exercise and ongoing mental health follow up) Keep all upcoming appointment discussed today Continue to consider a structured Adult Day Program          SDOH assessments and interventions completed:  No     Care Coordination Interventions:  Yes, provided  Interventions Today    Flowsheet Row Most Recent Value  Chronic Disease   Chronic disease during today's visit Other  [symptoms of depression, anxiety , memory changes]  General Interventions   General Interventions Discussed/Reviewed General Interventions Reviewed, Level of Care  [patient confirms that her daughter continues to assist w/managing finances, in home aid continues to assists 2x per week]  Level of Care Adult Daycare, Personal Care Services  [Pt has decided against Adult Day Program at this time-however continues to be encouraged]  Mental Health Interventions   Mental Health Discussed/Reviewed Mental Health Reviewed, Coping Strategies, Anxiety  [supportive counseling continues to be provided, increasing social involvement , physical excercise and considering ongoing mental health counseling for continued development of coping skills to address symptoms of anxiety]  Pharmacy Interventions   Pharmacy Dicussed/Reviewed Pharmacy Topics Discussed  [confirmed that pt  fills pill box weekly-1 for day and night-medication adherance encouraged]       Follow up plan: Follow up call scheduled for 07/09/23    Encounter Outcome:  Patient Visit Completed

## 2023-06-18 NOTE — Patient Instructions (Signed)
Visit Information  Thank you for taking time to visit with me today. Please don't hesitate to contact me if I can be of assistance to you.   Following are the goals we discussed today:   Goals Addressed             This Visit's Progress    community resource needs       Activities and task to complete in order to accomplish goals.   EMOTIONAL / MENTAL HEALTH SUPPORT Self Support options  (continue to consider increasing social activity, exercise and ongoing mental health follow up) Keep all upcoming appointment discussed today Continue to consider a structured Adult Day Program          Our next appointment is by telephone on 07/08/23 at 1pm  Please call the care guide team at [phone] if you need to cancel or reschedule your appointment.   If you are experiencing a Mental Health or Behavioral Health Crisis or need someone to talk to, please call 911   Patient verbalizes understanding of instructions and care plan provided today and agrees to view in MyChart. Active MyChart status and patient understanding of how to access instructions and care plan via MyChart confirmed with patient.     Telephone follow up appointment with care management team member scheduled for: 07/08/23   Verna Czech, LCSW [place]  [facility], [facility] Licensed Clinical Social Worker Care Coordinator  Direct Dial: [phone]

## 2023-06-26 ENCOUNTER — Telehealth: Payer: Self-pay | Admitting: Internal Medicine

## 2023-06-26 DIAGNOSIS — E782 Mixed hyperlipidemia: Secondary | ICD-10-CM

## 2023-06-26 DIAGNOSIS — R739 Hyperglycemia, unspecified: Secondary | ICD-10-CM

## 2023-06-26 NOTE — Telephone Encounter (Signed)
Lab orders placed.  

## 2023-06-26 NOTE — Telephone Encounter (Signed)
Patient need lab orders.

## 2023-06-29 NOTE — Progress Notes (Unsigned)
Cardiology Clinic Note   Date: 07/02/2023 ID: BRILYN PHON, DOB 1933-02-28, MRN [id number]  Primary Cardiologist:  Julien Nordmann, MD  Patient Profile    Alicia Mccann is a 89 y.o. female who presents to the clinic today for routine follow up.     Past medical history significant for: Coronary artery calcification/aortic atherosclerosis. CT chest 03/18/2019: Aortic atherosclerosis.  Normal heart size.  Three-vessel coronary artery calcifications.  No pericardial effusion PAD. Valvular heart disease. Echo 12/19/2008: Normal LV function, EF 65%.  Mild mitral and tricuspid insufficiency. Hypertension. Hyperlipidemia. Lipid panel 03/30/2023: LDL 64, HDL 48, TG 96, total 132. CKD stage III.  In summary, with echocardiogram in 2010 showing normal LV function with mild mitral regurgitation.  She also had a stress test at that time that was negative for ischemia.  Coronary artery calcification sclerosis was noted on chest CT.     History of Present Illness    ALYXIS LANIUS is followed by Dr. Mariah Milling for the above outlined history.  Patient was last seen in the office by Dr. Mariah Milling on 09/03/2021 for routine follow-up.  She had recently underwent short hospital admission for COVID-pneumonia.  She was noted to be hyponatremic and hypokalemic.  She improved with IV fluids, steroids, remdesivir.  She was doing well with continued fatigue.  No further cardiac workup indicated.  No medication changes were made.   Discussed the use of AI scribe software for clinical note transcription with the patient, who gave verbal consent to proceed.  The patient is accompanied by her home aide, Alona Bene.  She presents for routine follow-up. Patient denies shortness of breath, dyspnea on exertion, lower extremity edema, orthopnea or PND. No chest pain, pressure, or tightness. No palpitations.  No lightheadedness or dizziness. Her activity is limited to walking around the house and yard, and she expresses  uncertainty about whether she should be doing more. She has a pedal exerciser but does not use it regularly. She expresses a desire to regain strength in her legs and improve her balance. She has a history of falls and uses a cane for balance. She also has an emergency alert button for safety. She reports feeling "blue" and depressed, attributing it to living alone and "the state of the world." She expresses concern about her medications and whether they are working as they should.         ROS: All other systems reviewed and are otherwise negative except as noted in History of Present Illness.  Studies Reviewed    EKG Interpretation Date/Time:  Thursday July 02 2023 10:54:27 EST Ventricular Rate:  68 PR Interval:  154 QRS Duration:  82 QT Interval:  412 QTC Calculation: 438 R Axis:   51  Text Interpretation: Normal sinus rhythm Normal ECG When compared with ECG of 09-Aug-2021 13:33, Premature atrial complexes are no longer Present Vent. rate has decreased BY  43 BPM Reconfirmed by Carlos Levering [id number]) on 07/02/2023 11:22:55 AM            Physical Exam    VS:  BP 122/60 (BP Location: Left Arm, Patient Position: Sitting, Cuff Size: Normal)   Pulse 68   Ht 5\' 2"  (1.575 m)   Wt 127 lb 6.4 oz (57.8 kg)   SpO2 99%   BMI 23.30 kg/m  , BMI Body mass index is 23.3 kg/m.  GEN: Well nourished, well developed, in no acute distress. Neck: No JVD or carotid bruits. Cardiac:  RRR. No murmurs. No rubs or gallops.  Respiratory:  Respirations regular and unlabored. Clear to auscultation without rales, wheezing or rhonchi. GI: Soft, nontender, nondistended. Extremities: Radials/DP/PT 2+ and equal bilaterally. No clubbing or cyanosis. No edema.  Skin: Warm and dry, no rash. Neuro: Strength intact.  Assessment & Plan      Coronary artery calcification/aortic atherosclerosis Seen on CT chest July 2020.  Patient denies chest pain, pressure or tightness. She has limited her  activity secondary to uncertainty of what she can or cannot do. She does have a pedal exerciser at home but has not been using it. She wonders if she should start walking her dog again. She is encouraged to use her pedal exerciser and walk on her own before advancing to walking her dog due to balance concerns. EKG is NSR with no acute changes.  -Continue aspirin, atorvastatin. -Advance activity as tolerated.   Hypertension BP today 122/60. She denies lightheadedness or dizziness. She is having some balance issues and now ambulates with a cane.  -Continue Maxzide, amlodipine.  Hyperlipidemia LDL August 2024 64, at goal.  -Continue atorvastatin.  Depression   Reports feeling "blue" and depressed at times. She inquires if she is on the right dose of depression medications. These are currently managed by Dr. Lorin Picket. Explained that I would defer any decision regarding those medications to Dr. Lorin Picket.    -Continue current medications as prescribed by PCP.          Disposition: Return in 1 year or sooner as needed.          Signed, Etta Grandchild. Velera Lansdale, DNP, NP-C

## 2023-07-02 ENCOUNTER — Ambulatory Visit: Payer: Medicare PPO | Attending: Student | Admitting: Student

## 2023-07-02 ENCOUNTER — Encounter: Payer: Self-pay | Admitting: Student

## 2023-07-02 VITALS — BP 122/60 | HR 68 | Ht 62.0 in | Wt 127.4 lb

## 2023-07-02 DIAGNOSIS — I10 Essential (primary) hypertension: Secondary | ICD-10-CM | POA: Diagnosis not present

## 2023-07-02 DIAGNOSIS — F339 Major depressive disorder, recurrent, unspecified: Secondary | ICD-10-CM

## 2023-07-02 DIAGNOSIS — E782 Mixed hyperlipidemia: Secondary | ICD-10-CM | POA: Diagnosis not present

## 2023-07-02 DIAGNOSIS — I700 Atherosclerosis of aorta: Secondary | ICD-10-CM

## 2023-07-02 DIAGNOSIS — I2510 Atherosclerotic heart disease of native coronary artery without angina pectoris: Secondary | ICD-10-CM | POA: Diagnosis not present

## 2023-07-02 NOTE — Patient Instructions (Signed)
Medication Instructions:  Your Physician recommend you continue on your current medication as directed.    *If you need a refill on your cardiac medications before your next appointment, please call your pharmacy*   Lab Work: None   Follow-Up: At [facility], you and your health needs are our priority.  As part of our continuing mission to provide you with exceptional heart care, we have created designated Provider Care Teams.  These Care Teams include your primary Cardiologist (physician) and Advanced Practice Providers (APPs -  Physician Assistants and Nurse Practitioners) who all work together to provide you with the care you need, when you need it.  We recommend signing up for the patient portal called "MyChart".  Sign up information is provided on this After Visit Summary.  MyChart is used to connect with patients for Virtual Visits (Telemedicine).  Patients are able to view lab/test results, encounter notes, upcoming appointments, etc.  Non-urgent messages can be sent to your provider as well.   To learn more about what you can do with MyChart, go to [email or web address].    Your next appointment:   12 month(s)  Provider:   You may see Julien Nordmann, MD or one of the following Advanced Practice Providers on your designated Care Team:   Nicolasa Ducking, NP Eula Listen, PA-C Cadence Fransico Michael, PA-C Charlsie Quest, NP Carlos Levering, NP

## 2023-07-03 ENCOUNTER — Other Ambulatory Visit: Payer: Medicare PPO

## 2023-07-07 ENCOUNTER — Ambulatory Visit: Payer: Medicare PPO | Admitting: Internal Medicine

## 2023-07-08 ENCOUNTER — Ambulatory Visit: Payer: Self-pay | Admitting: *Deleted

## 2023-07-08 NOTE — Patient Outreach (Signed)
  Care Coordination   Follow Up Visit Note   07/09/2023 Name: Alicia Mccann MRN: [id number] DOB: 1932-11-14  Alicia Mccann is a 89 y.o. year old female who sees Dale [place], MD for primary care. I spoke with  Alicia Mccann by phone today.  What matters to the patients health and wellness today?  Management of symptoms of anxiety. In home care assistance options     Goals Addressed             This Visit's Progress    community resource needs       Activities and task to complete in order to accomplish goals.   EMOTIONAL / MENTAL HEALTH SUPPORT Self Support options  (continue to consider increasing social activity, exercise , use of natural supports and ongoing mental health follow up) Keep all upcoming appointment discussed today Continue to consider a structured Adult Day Program          SDOH assessments and interventions completed:  No     Care Coordination Interventions:  Yes, provided  Interventions Today    Flowsheet Row Most Recent Value  Chronic Disease   Chronic disease during today's visit Other  [depression, anxiety, memory changes]  General Interventions   General Interventions Discussed/Reviewed General Interventions Reviewed  Level of Care Personal Care Services  [in home care aid comes in weekly]  Mental Health Interventions   Mental Health Discussed/Reviewed Mental Health Reviewed, Coping Strategies, Anxiety, Depression  [pt reports "holding on" describes having "blah" days discussed financial and family issues-verbalization of feelings encouraged, coping strategies discussed, self care, use of natural supports encouraged]       Follow up plan: Follow up call scheduled for 07/23/23    Encounter Outcome:  Patient Visit Completed

## 2023-07-09 ENCOUNTER — Ambulatory Visit: Payer: Medicare PPO | Admitting: Internal Medicine

## 2023-07-09 ENCOUNTER — Encounter: Payer: Self-pay | Admitting: Internal Medicine

## 2023-07-09 VITALS — BP 126/76 | HR 83 | Temp 98.5°F | Ht 62.0 in | Wt 128.4 lb

## 2023-07-09 DIAGNOSIS — I739 Peripheral vascular disease, unspecified: Secondary | ICD-10-CM | POA: Diagnosis not present

## 2023-07-09 DIAGNOSIS — E782 Mixed hyperlipidemia: Secondary | ICD-10-CM

## 2023-07-09 DIAGNOSIS — N1831 Chronic kidney disease, stage 3a: Secondary | ICD-10-CM

## 2023-07-09 DIAGNOSIS — R351 Nocturia: Secondary | ICD-10-CM

## 2023-07-09 DIAGNOSIS — F339 Major depressive disorder, recurrent, unspecified: Secondary | ICD-10-CM

## 2023-07-09 DIAGNOSIS — I700 Atherosclerosis of aorta: Secondary | ICD-10-CM

## 2023-07-09 DIAGNOSIS — R739 Hyperglycemia, unspecified: Secondary | ICD-10-CM | POA: Diagnosis not present

## 2023-07-09 DIAGNOSIS — F338 Other recurrent depressive disorders: Secondary | ICD-10-CM

## 2023-07-09 DIAGNOSIS — Z1231 Encounter for screening mammogram for malignant neoplasm of breast: Secondary | ICD-10-CM

## 2023-07-09 DIAGNOSIS — I10 Essential (primary) hypertension: Secondary | ICD-10-CM

## 2023-07-09 LAB — BASIC METABOLIC PANEL
BUN: 30 mg/dL — ABNORMAL HIGH (ref 6–23)
CO2: 30 meq/L (ref 19–32)
Calcium: 10.1 mg/dL (ref 8.4–10.5)
Chloride: 102 meq/L (ref 96–112)
Creatinine, Ser: 1.08 mg/dL (ref 0.40–1.20)
GFR: 45.99 mL/min — ABNORMAL LOW (ref >60.00)
Glucose, Bld: 71 mg/dL (ref 70–99)
Potassium: 4.2 meq/L (ref 3.5–5.1)
Sodium: 140 meq/L (ref 135–145)

## 2023-07-09 LAB — TSH: TSH: 1.9 u[IU]/mL (ref 0.35–5.50)

## 2023-07-09 LAB — LIPID PANEL
Cholesterol: 153 mg/dL (ref 0–200)
HDL: 52.8 mg/dL (ref >39.00)
LDL Cholesterol: 82 mg/dL (ref 0–99)
NonHDL: 99.7
Total CHOL/HDL Ratio: 3
Triglycerides: 89 mg/dL (ref 0.0–149.0)
VLDL: 17.8 mg/dL (ref 0.0–40.0)

## 2023-07-09 LAB — HEPATIC FUNCTION PANEL
ALT: 11 U/L (ref 0–35)
AST: 15 U/L (ref 0–37)
Albumin: 4.3 g/dL (ref 3.5–5.2)
Alkaline Phosphatase: 82 U/L (ref 39–117)
Bilirubin, Direct: 0.1 mg/dL (ref 0.0–0.3)
Total Bilirubin: 0.5 mg/dL (ref 0.2–1.2)
Total Protein: 6.3 g/dL (ref 6.0–8.3)

## 2023-07-09 LAB — HEMOGLOBIN A1C: Hgb A1c MFr Bld: 6 % (ref 4.6–6.5)

## 2023-07-09 NOTE — Patient Instructions (Signed)
Visit Information  Thank you for taking time to visit with me today. Please don't hesitate to contact me if I can be of assistance to you.   Following are the goals we discussed today:   Goals Addressed             This Visit's Progress    community resource needs       Activities and task to complete in order to accomplish goals.   EMOTIONAL / MENTAL HEALTH SUPPORT Self Support options  (continue to consider increasing social activity, exercise , use of natural supports and ongoing mental health follow up) Keep all upcoming appointment discussed today Continue to consider a structured Adult Day Program          Our next appointment is by telephone on 07/23/23 at 11am  Please call the care guide team at [phone] if you need to cancel or reschedule your appointment.   If you are experiencing a Mental Health or Behavioral Health Crisis or need someone to talk to, please call the Suicide and Crisis Lifeline: 988   Patient verbalizes understanding of instructions and care plan provided today and agrees to view in MyChart. Active MyChart status and patient understanding of how to access instructions and care plan via MyChart confirmed with patient.     Telephone follow up appointment with care management team member scheduled for: 07/23/23   Verna Czech, LCSW [facility], [facility] Licensed Clinical Social Worker Care Coordinator  Direct Dial: [phone]

## 2023-07-09 NOTE — Progress Notes (Signed)
Subjective:    Patient ID: Alicia Mccann, female    DOB: December 02, 1932, 89 y.o.   MRN: [id number]  Patient here for  Chief Complaint  Patient presents with   Medical Management of Chronic Issues    HPI Here to follow up regarding increased stress and anxiety. She is accompanied by her friend and part time caretaker - Alona Bene. Saw podiatry 06/11/23 - nails debrided. Per note, they discussed gel padding products and spacers and wide shoes. F/u with cardiology 07/02/23 - continue aspirin and lipitor.  Encouraged increased activity. She has been trying to walk more.  Still with increased stress, some depression and anxiety - per previous note. Appears to be doing some better.  Have placed CCM referral.  They have been in contact. Planning for home visit after Thanksgiving. Discussed meals on wheels.  Agreeable. No chest pain.  Breathing stable.    Past Medical History:  Diagnosis Date   Allergic rhinitis    Anemia    Anxiety    Cellulitis    Cholelithiasis    Essential hypertension    History of stress test    a. 12/2008 Ex MV: EF 78%, no ischemia.   Insomnia    Osteoarthritis    Tachycardia    Valvular heart disease    a. 12/2008 Echo: EF 65%, mild MR;  b. 2/6 SEM RUSB - insignificant murmur, prob Ao Sclerosis.   Past Surgical History:  Procedure Laterality Date   BACK SURGERY     BUNIONECTOMY     CATARACT EXTRACTION W/PHACO Right 05/18/2019   Procedure: CATARACT EXTRACTION PHACO AND INTRAOCULAR LENS PLACEMENT (IOC) RIGHT MALYUGIN  01:06.5  20.6%  13.72;  Surgeon: Lockie Mola, MD;  Location: [facility];  Service: Ophthalmology;  Laterality: Right;  please leave patient arrival 10   CATARACT EXTRACTION W/PHACO Left 06/08/2019   Procedure: CATARACT EXTRACTION PHACO AND INTRAOCULAR LENS PLACEMENT (IOC) LEFT  00:51.0  20.3%  10.37;  Surgeon: Lockie Mola, MD;  Location: [facility];  Service: Ophthalmology;  Laterality: Left;  ARRIVAL 10:30 PLEASE LEAVE    CHOLECYSTECTOMY  03/29/2007   GALLBLADDER SURGERY     HIP SURGERY     bilateral    REPLACEMENT TOTAL KNEE Right    THUMB ARTHROSCOPY     Family History  Problem Relation Age of Onset   Heart attack Father    Stroke Father    Heart disease Father    CAD Father    Heart attack Son 52       MI   Hypertension Son    Hypertension Mother    Heart disease Mother    Stroke Mother    Breast cancer Neg Hx    Social History   Socioeconomic History   Marital status: Widowed    Spouse name: Not on file   Number of children: Not on file   Years of education: Not on file   Highest education level: Not on file  Occupational History   Not on file  Tobacco Use   Smoking status: Never   Smokeless tobacco: Never  Vaping Use   Vaping status: Never Used  Substance and Sexual Activity   Alcohol use: Yes    Comment: 2 oz QOD   Drug use: No   Sexual activity: Not Currently  Other Topics Concern   Not on file  Social History Narrative   Not on file   Social Determinants of Health   Financial Resource Strain: Low Risk  (10/04/2021)  Overall Financial Resource Strain (CARDIA)    Difficulty of Paying Living Expenses: Not hard at all  Food Insecurity: No Food Insecurity (06/02/2023)   Hunger Vital Sign    Worried About Running Out of Food in the Last Year: Never true    Ran Out of Food in the Last Year: Never true  Transportation Needs: No Transportation Needs (06/02/2023)   PRAPARE - Administrator, Civil Service (Medical): No    Lack of Transportation (Non-Medical): No  Physical Activity: Insufficiently Active (10/04/2021)   Exercise Vital Sign    Days of Exercise per Week: 4 days    Minutes of Exercise per Session: 20 min  Stress: No Stress Concern Present (10/04/2021)   [employer] of Occupational Health - Occupational Stress Questionnaire    Feeling of Stress : Not at all  Social Connections: Socially Isolated (06/02/2023)   Social Connection and Isolation  Panel [NHANES]    Frequency of Communication with Friends and Family: More than three times a week    Frequency of Social Gatherings with Friends and Family: Once a week    Attends Religious Services: Never    Database administrator or Organizations: No    Attends Engineer, structural: Not on file    Marital Status: Widowed     Review of Systems  Constitutional:  Negative for appetite change and unexpected weight change.  HENT:  Negative for congestion and sinus pressure.   Respiratory:  Negative for cough, chest tightness and shortness of breath.   Cardiovascular:  Negative for chest pain and palpitations.  Gastrointestinal:  Negative for abdominal pain, diarrhea, nausea and vomiting.  Genitourinary:  Negative for difficulty urinating and dysuria.  Musculoskeletal:  Negative for joint swelling and myalgias.  Skin:  Negative for color change and rash.  Neurological:  Negative for dizziness and headaches.  Psychiatric/Behavioral:  Negative for dysphoric mood and self-injury.        Objective:     BP 126/76   Pulse 83   Temp 98.5 F (36.9 C) (Oral)   Ht 5\' 2"  (1.575 m)   Wt 128 lb 6.4 oz (58.2 kg)   SpO2 98%   BMI 23.48 kg/m  Wt Readings from Last 3 Encounters:  07/09/23 128 lb 6.4 oz (58.2 kg)  07/02/23 127 lb 6.4 oz (57.8 kg)  05/28/23 126 lb 9.6 oz (57.4 kg)    Physical Exam Vitals reviewed.  Constitutional:      General: She is not in acute distress.    Appearance: Normal appearance.  HENT:     Head: Normocephalic and atraumatic.     Right Ear: External ear normal.     Left Ear: External ear normal.  Eyes:     General: No scleral icterus.       Right eye: No discharge.        Left eye: No discharge.     Conjunctiva/sclera: Conjunctivae normal.  Neck:     Thyroid: No thyromegaly.  Cardiovascular:     Rate and Rhythm: Normal rate and regular rhythm.  Pulmonary:     Effort: No respiratory distress.     Breath sounds: Normal breath sounds. No  wheezing.  Abdominal:     General: Bowel sounds are normal.     Palpations: Abdomen is soft.     Tenderness: There is no abdominal tenderness.  Musculoskeletal:        General: No swelling or tenderness.     Cervical back: Neck supple. No  tenderness.  Lymphadenopathy:     Cervical: No cervical adenopathy.  Skin:    Findings: No erythema or rash.  Neurological:     Mental Status: She is alert.  Psychiatric:        Mood and Affect: Mood normal.        Behavior: Behavior normal.      Outpatient Encounter Medications as of 07/09/2023  Medication Sig   acetaminophen (TYLENOL) 500 MG tablet Take 1,000 mg by mouth every 8 (eight) hours as needed for mild pain or moderate pain.   ALPRAZolam (XANAX) 0.25 MG tablet TAKE 1 TABLET BY MOUTH DAILY AS NEEDED FOR ANXIETY   amLODipine (NORVASC) 2.5 MG tablet TAKE 1 TABLET BY MOUTH DAILY   aspirin 81 MG EC tablet Take 81 mg by mouth daily. Swallow whole.   atorvastatin (LIPITOR) 10 MG tablet TAKE 1 TABLET BY MOUTH DAILY   cholecalciferol (VITAMIN D) 1000 UNITS tablet Take 1,000 Units by mouth every other day.    cyanocobalamin 100 MCG tablet Take 100 mcg by mouth daily.   sertraline (ZOLOFT) 25 MG tablet TAKE 1 TABLET BY MOUTH ONCE DAILY   triamterene-hydrochlorothiazide (MAXZIDE-25) 37.5-25 MG tablet TAKE 1 TABLET BY MOUTH DAILY   zolpidem (AMBIEN) 5 MG tablet Take 1 tablet (5 mg total) by mouth at bedtime as needed for sleep.   No facility-administered encounter medications on file as of 07/09/2023.     Lab Results  Component Value Date   WBC 9.1 11/06/2022   HGB 12.7 11/06/2022   HCT 38.9 11/06/2022   PLT 335.0 11/06/2022   GLUCOSE 71 07/09/2023   CHOL 153 07/09/2023   TRIG 89.0 07/09/2023   HDL 52.80 07/09/2023   LDLDIRECT 66.0 09/07/2018   LDLCALC 82 07/09/2023   ALT 11 07/09/2023   AST 15 07/09/2023   NA 140 07/09/2023   K 4.2 07/09/2023   CL 102 07/09/2023   CREATININE 1.08 07/09/2023   BUN 30 (H) 07/09/2023   CO2 30  07/09/2023   TSH 1.90 07/09/2023   INR 0.9 01/16/2009   HGBA1C 6.0 07/09/2023    No results found.     Assessment & Plan:  Nocturia -     Urinalysis, Routine w reflex microscopic; Future  Mixed hyperlipidemia Assessment & Plan: On lipitor.  Low cholesterol diet and exercise.  Follow lipid panel and liver function tests.    Orders: -     TSH -     Basic metabolic panel -     Hepatic function panel -     Lipid panel  Hyperglycemia Assessment & Plan: Follow met b and a1c.    Orders: -     Hemoglobin A1c  Visit for screening mammogram -     3D Screening Mammogram, Left and Right  Seasonal affective disorder [facility]) Assessment & Plan: On zoloft. Friends helping.  Her dog [facility]) has helped.  Overall appears to be doing some better today.  CCM referral has been placed.  Planning for home visit soon.  See if qualifies for meals on wheels.  Discussed day program - would like for her to participate.  Follow.    PAD (peripheral artery disease) (HCC) Assessment & Plan: Continue atorvastatin and aspirin.     Essential hypertension Assessment & Plan: Continue triam/hctz and amlodipine.  Follow pressures.  Follow metabolic panel.    Depression, recurrent (HCC) Assessment & Plan: Discussed.  Discussed her increased anxiety/stress.  She has a friend to stay with her part time.  Discussed CCM referral/follow  up.  Planning in home visit.  Discussed agreement with day program.  She is in agreement.  Follow. Continue zoloft.    Stage 3a chronic kidney disease (HCC) Assessment & Plan: Continue to avoid antiinflammatories.  Stay hydrated.  Follow metabolic panel.    Aortic atherosclerosis (HCC) Assessment & Plan: Continue atorvastatin.       Dale Brandywine, MD

## 2023-07-12 ENCOUNTER — Telehealth: Payer: Self-pay | Admitting: Internal Medicine

## 2023-07-12 ENCOUNTER — Encounter: Payer: Self-pay | Admitting: Internal Medicine

## 2023-07-12 NOTE — Assessment & Plan Note (Signed)
On zoloft. Friends helping.  Her dog [facility]) has helped.  Overall appears to be doing some better today.  CCM referral has been placed.  Planning for home visit soon.  See if qualifies for meals on wheels.  Discussed day program - would like for her to participate.  Follow.

## 2023-07-12 NOTE — Assessment & Plan Note (Signed)
Continue atorvastatin and aspirin ?

## 2023-07-12 NOTE — Assessment & Plan Note (Signed)
Continue to avoid antiinflammatories.  Stay hydrated.  Follow metabolic panel.

## 2023-07-12 NOTE — Assessment & Plan Note (Signed)
Discussed.  Discussed her increased anxiety/stress.  She has a friend to stay with her part time.  Discussed CCM referral/follow up.  Planning in home visit.  Discussed agreement with day program.  She is in agreement.  Follow. Continue zoloft.

## 2023-07-12 NOTE — Assessment & Plan Note (Signed)
Continue atorvastatin

## 2023-07-12 NOTE — Assessment & Plan Note (Signed)
Follow met b and a1c.

## 2023-07-12 NOTE — Assessment & Plan Note (Signed)
Continue triam/hctz and amlodipine.  Follow pressures.  Follow metabolic panel.

## 2023-07-12 NOTE — Assessment & Plan Note (Signed)
On lipitor.  Low cholesterol diet and exercise.  Follow lipid panel and liver function tests.   

## 2023-07-12 NOTE — Telephone Encounter (Signed)
Thank you for contacting Alicia Mccann.  I saw her recently and discussed the day program, etc.  She said you were supposed to meet.  I was also wanting to see if she would qualify for meals on wheels. Thank you again for your help and for seeing her.

## 2023-07-14 ENCOUNTER — Telehealth: Payer: Self-pay | Admitting: Internal Medicine

## 2023-07-14 DIAGNOSIS — Z1231 Encounter for screening mammogram for malignant neoplasm of breast: Secondary | ICD-10-CM

## 2023-07-14 NOTE — Telephone Encounter (Signed)
Patient declined mammogram at last appt.

## 2023-07-14 NOTE — Telephone Encounter (Signed)
I received notification that she is overdue mammogram.  Need to schedule.

## 2023-07-23 ENCOUNTER — Ambulatory Visit: Payer: Self-pay | Admitting: *Deleted

## 2023-07-24 NOTE — Patient Outreach (Signed)
  Care Coordination   Follow Up Visit Note   07/24/2023 Name: Alicia Mccann MRN: [id number] DOB: 1933-05-06  Alicia Mccann is a 89 y.o. year old female who sees Dale [place], MD for primary care. I spoke with  Alicia Mccann by phone on 07/23/23.  What matters to the patients health and wellness today?  Management of symptoms of anxiety. In home care assistance options       Goals Addressed       Goals Addressed             This Visit's Progress    community resource needs       Activities and task to complete in order to accomplish goals.   EMOTIONAL / MENTAL HEALTH SUPPORT Self Support options  (continue to consider increasing social activity, exercise , use of natural supports and ongoing mental health follow up) Keep all upcoming appointment discussed today           SDOH assessments and interventions completed:  No     Care Coordination Interventions:  Yes, provided  Interventions Today    Flowsheet Row Most Recent Value  Chronic Disease   Chronic disease during today's visit Other  [depression, anxiety, memory changes]  General Interventions   General Interventions Discussed/Reviewed General Interventions Reviewed, Community Resources  Level of Care Personal Care Services, Adult Daycare  [confirmed that private duty aid continues to assist weekly to assists with meals, transportation to medical appts.etc. Pt confirms having a strong support system, is not interested in Day Program options at this time]  Mental Health Interventions   Mental Health Discussed/Reviewed Mental Health Discussed, Coping Strategies, Anxiety, Depression  [confirmed remaining active to manage depression and anxiety, confirmed family and friend supports are beneficial postiive reinsforcement provided for coping strategies utilized emotional support, refelctive listening continues to be provided]  Nutrition Interventions   Nutrition Discussed/Reviewed Nutrition Discussed   [denies needs for meals on wheels]  Safety Interventions   Safety Discussed/Reviewed Safety Discussed       Follow up plan: Follow up call scheduled for 08/21/23    Encounter Outcome:  Patient Visit Completed

## 2023-07-24 NOTE — Patient Instructions (Signed)
Visit Information  Thank you for taking time to visit with me today. Please don't hesitate to contact me if I can be of assistance to you.   Following are the goals we discussed today:   Goals Addressed             This Visit's Progress    community resource needs       Activities and task to complete in order to accomplish goals.   EMOTIONAL / MENTAL HEALTH SUPPORT Self Support options  (continue to consider increasing social activity, exercise , use of natural supports and ongoing mental health follow up) Keep all upcoming appointment discussed today           Our next appointment is by telephone on 08/21/23 at 10am  Please call the care guide team at [phone] if you need to cancel or reschedule your appointment.   If you are experiencing a Mental Health or Behavioral Health Crisis or need someone to talk to, please call the Suicide and Crisis Lifeline: 988   Patient verbalizes understanding of instructions and care plan provided today and agrees to view in MyChart. Active MyChart status and patient understanding of how to access instructions and care plan via MyChart confirmed with patient.     Telephone follow up appointment with care management team member scheduled for: 08/21/23     Verna Czech, LCSW [place]  [facility], [facility] Licensed Clinical Social Worker Care Coordinator  Direct Dial: [phone]

## 2023-07-29 ENCOUNTER — Other Ambulatory Visit: Payer: Self-pay | Admitting: Internal Medicine

## 2023-07-29 ENCOUNTER — Other Ambulatory Visit: Payer: Self-pay | Admitting: Cardiovascular Disease

## 2023-07-29 DIAGNOSIS — I10 Essential (primary) hypertension: Secondary | ICD-10-CM

## 2023-08-06 ENCOUNTER — Telehealth: Payer: Self-pay

## 2023-08-06 NOTE — Telephone Encounter (Signed)
Copied from CRM [id number]. Topic: Clinical - Medication Question >> Aug 06, 2023 11:55 AM Maxwell Marion wrote: Reason for CRM: Pharmacist at [facility] told patient to discard medications for the xanax and Remus Loffler so patient is confused as to what she should do about her medications, would like a call back at [phone]. If she doesn't answer home phone, call her cell

## 2023-08-07 NOTE — Telephone Encounter (Signed)
LMTCB

## 2023-08-11 NOTE — Telephone Encounter (Signed)
Spoke with patient and pharmacy. She is a med sync patient and does not need to do anything different with her medications. She has her xanax and Remus Loffler as needed that are not on automatic fill. Medications will stay as is.

## 2023-08-20 ENCOUNTER — Ambulatory Visit: Payer: Self-pay | Admitting: *Deleted

## 2023-08-20 NOTE — Patient Instructions (Signed)
 Visit Information  Thank you for taking time to visit with me today. Please don't hesitate to contact me if I can be of assistance to you.   Following are the goals we discussed today:   Goals Addressed             This Visit's Progress    community resource needs       Activities and task to complete in order to accomplish goals.   EMOTIONAL / MENTAL HEALTH SUPPORT Self Support options  (continue to consider increasing social activity, exercise , use of natural supports and ongoing mental health follow up) Keep all upcoming appointment discussed today Continue to take all medications as prescribed           Our next appointment is by telephone on 09/10/23 at 11am  Please call the care guide team at [phone] if you need to cancel or reschedule your appointment.   If you are experiencing a Mental Health or Behavioral Health Crisis or need someone to talk to, please call the Suicide and Crisis Lifeline: [phone]   Patient verbalizes understanding of instructions and care plan provided today and agrees to view in MyChart. Active MyChart status and patient understanding of how to access instructions and care plan via MyChart confirmed with patient.     Telephone follow up appointment with care management team member scheduled for: 09/10/23   Lenn Mean, LCSW [place]  [facility], [facility] Licensed Clinical Social Worker Care Coordinator  Direct Dial: [phone]

## 2023-08-20 NOTE — Patient Outreach (Signed)
  Care Coordination   Follow Up Visit Note   08/20/2023 Name: Alicia Mccann MRN: [id number] DOB: Apr 20, 1933  Alicia Mccann is a 89 y.o. year old female who sees Glendia Shad, MD for primary care. I spoke with  Alicia Mccann by phone today.  What matters to the patients health and wellness today?  Mental health support    Goals Addressed             This Visit's Progress    community resource needs       Activities and task to complete in order to accomplish goals.   EMOTIONAL / MENTAL HEALTH SUPPORT Self Support options  (continue to consider increasing social activity, exercise , use of natural supports and ongoing mental health follow up) Keep all upcoming appointment discussed today Continue to take all medications as prescribed           SDOH assessments and interventions completed:  No     Care Coordination Interventions:  Yes, provided  Interventions Today    Flowsheet Row Most Recent Value  Chronic Disease   Chronic disease during today's visit Other  General Interventions   General Interventions Discussed/Reviewed Referral to Nurse  Doctor Visits Discussed/Reviewed Doctor Visits Reviewed, PCP  [09/17/23 PCP   04/20/24 dermatologist-skin lesion]  Level of Care Personal Care Services  [private duty aid-Joyce comes 2x per week to assist with in home needs- Tue and Thurs neighbors also assist]  Exercise Interventions   Exercise Discussed/Reviewed Exercise Discussed  [will continue to consider excercise]  Mental Health Interventions   Mental Health Discussed/Reviewed Mental Health Reviewed, Coping Strategies, Depression, Anxiety  [continues to consider ongoing mental health counseling, reports feeling down at times due to challenges with family, verbalization of feelings discussed, boundary setting emphasized, adding structure to her day also discussed]  Nutrition Interventions   Nutrition Discussed/Reviewed Nutrition Discussed  [Private duty aid often  leaves food for pt-pt plans to go to the store today for ready to eat meals]  Pharmacy Interventions   Pharmacy Dicussed/Reviewed Pharmacy Topics Discussed  [continues to take xanax  as needed]  Safety Interventions   Safety Discussed/Reviewed Safety Discussed  [confirmed that patirent has med alert for safety, uses cain as well when ambulating outside of the home]       Follow up plan: Follow up call scheduled for 09/10/23    Encounter Outcome:  Patient Visit Completed

## 2023-08-21 ENCOUNTER — Encounter: Payer: Self-pay | Admitting: *Deleted

## 2023-08-24 ENCOUNTER — Other Ambulatory Visit: Payer: Self-pay | Admitting: Cardiovascular Disease

## 2023-09-03 ENCOUNTER — Encounter: Payer: Medicare PPO | Admitting: *Deleted

## 2023-09-10 ENCOUNTER — Ambulatory Visit: Payer: Self-pay | Admitting: *Deleted

## 2023-09-10 NOTE — Patient Outreach (Addendum)
  Care Coordination   Follow Up Visit Note   09/10/2023 Name: Alicia Mccann MRN: [id number] DOB: 06/16/1933  Alicia Mccann is a 89 y.o. year old female who sees Dale Lewiston, MD for primary care. I spoke with  Lula Olszewski by phone today.  What matters to the patients health and wellness today?  Mental health support to manage symptoms of anxiety. Patient provided verbal permission to contact her daughter Johnny Bridge [phone].   Goals Addressed             This Visit's Progress    community resource needs       Activities and task to complete in order to accomplish goals.   EMOTIONAL / MENTAL HEALTH SUPPORT Self Support options  (continue to consider increasing social activity, exercise , use of natural supports, increasing private duty care and ongoing mental health follow up) Please follow up with long term care insurance plans regarding coverage for private duty care  Keep all upcoming appointment discussed today Continue to take all medications as prescribed           SDOH assessments and interventions completed:  No     Care Coordination Interventions:  Yes, provided  Interventions Today    Flowsheet Row Most Recent Value  Chronic Disease   Chronic disease during today's visit Other  [anxiety]  General Interventions   General Interventions Discussed/Reviewed General Interventions Reviewed, Doctor Visits, Level of Care  [continued assessment of community resource/mental health needs.]  Doctor Visits Discussed/Reviewed Doctor Visits Reviewed  [PCP 09/17/23]  Level of Care Personal Care Services  [patient has aid that comes 2x per week would like to come more often. Discussed financial challenges, encouraged follow up with her long term care insurance plan for possible coverage for a in home aid]  Mental Health Interventions   Mental Health Discussed/Reviewed Mental Health Reviewed, Coping Strategies, Anxiety  [MH needs addressed, increase anxiety and  feeling overwhelmed related to her finances and health of her pet acknowledged emotional support provided, discussed plan to to be more proactive-prioritzing tasks, f/u on additional hours for aid care]       Follow up plan: Follow up call scheduled for 09/24/23    Encounter Outcome:  Patient Visit Completed

## 2023-09-10 NOTE — Patient Instructions (Signed)
Visit Information  Thank you for taking time to visit with me today. Please don't hesitate to contact me if I can be of assistance to you.   Following are the goals we discussed today:   Goals Addressed             This Visit's Progress    community resource needs       Activities and task to complete in order to accomplish goals.   EMOTIONAL / MENTAL HEALTH SUPPORT Self Support options  (continue to consider increasing social activity, exercise , use of natural supports, increasing private duty care and ongoing mental health follow up) Please follow up with long term care insurance plans regarding coverage for private duty care  Keep all upcoming appointment discussed today Continue to take all medications as prescribed           Our next appointment is by telephone on 09/24/23 at 11am  Please call the care guide team at [phone] if you need to cancel or reschedule your appointment.   If you are experiencing a Mental Health or Behavioral Health Crisis or need someone to talk to, please call 911   Patient verbalizes understanding of instructions and care plan provided today and agrees to view in MyChart. Active MyChart status and patient understanding of how to access instructions and care plan via MyChart confirmed with patient.     Telephone follow up appointment with care management team member scheduled for: 09/24/23  Verna Czech, LCSW [place]  [facility], [facility] Licensed Clinical Social Worker Care Coordinator  Direct Dial: [phone]

## 2023-09-17 ENCOUNTER — Ambulatory Visit: Payer: Medicare PPO | Admitting: Internal Medicine

## 2023-09-17 ENCOUNTER — Other Ambulatory Visit: Payer: Self-pay | Admitting: Internal Medicine

## 2023-09-17 VITALS — BP 126/72 | HR 76 | Temp 98.2°F | Resp 16 | Ht 62.0 in | Wt 128.6 lb

## 2023-09-17 DIAGNOSIS — F339 Major depressive disorder, recurrent, unspecified: Secondary | ICD-10-CM

## 2023-09-17 DIAGNOSIS — I739 Peripheral vascular disease, unspecified: Secondary | ICD-10-CM

## 2023-09-17 DIAGNOSIS — E782 Mixed hyperlipidemia: Secondary | ICD-10-CM | POA: Diagnosis not present

## 2023-09-17 DIAGNOSIS — R296 Repeated falls: Secondary | ICD-10-CM | POA: Diagnosis not present

## 2023-09-17 DIAGNOSIS — F338 Other recurrent depressive disorders: Secondary | ICD-10-CM | POA: Diagnosis not present

## 2023-09-17 DIAGNOSIS — Z23 Encounter for immunization: Secondary | ICD-10-CM

## 2023-09-17 DIAGNOSIS — I700 Atherosclerosis of aorta: Secondary | ICD-10-CM

## 2023-09-17 DIAGNOSIS — R739 Hyperglycemia, unspecified: Secondary | ICD-10-CM

## 2023-09-17 DIAGNOSIS — I10 Essential (primary) hypertension: Secondary | ICD-10-CM

## 2023-09-17 DIAGNOSIS — I38 Endocarditis, valve unspecified: Secondary | ICD-10-CM

## 2023-09-17 DIAGNOSIS — F419 Anxiety disorder, unspecified: Secondary | ICD-10-CM

## 2023-09-17 DIAGNOSIS — N1831 Chronic kidney disease, stage 3a: Secondary | ICD-10-CM

## 2023-09-17 NOTE — Telephone Encounter (Signed)
Copied from CRM [id number]. Topic: Clinical - Medication Refill >> Sep 17, 2023  3:10 PM Denese Killings wrote: Most Recent Primary Care Visit:  Provider: Dale [place]  Department: [facility]  Visit Type: OFFICE VISIT  Date: 09/17/2023  Medication: atorvastatin (LIPITOR) 10 MG tablet sertraline (ZOLOFT) 25 MG tablet aspirin 81 MG EC tablet triamterene-hydrochlorothiazide (MAXZIDE-25) 37.5-25 MG tablet  Has the patient contacted their pharmacy? Yes (Agent: If no, request that the patient contact the pharmacy for the refill. If patient does not wish to contact the pharmacy document the reason why and proceed with request.) (Agent: If yes, when and what did the pharmacy advise?) sates insurance wont pay until 02/04  Is this the correct pharmacy for this prescription? Yes If no, delete pharmacy and type the correct one.  This is the patient's preferred pharmacy:  [facility] - [place], [place] - [address] [place] [address] Phone: [phone] Fax: [phone]   Has the prescription been filled recently? yes  Is the patient out of the medication? No  Has the patient been seen for an appointment in the last year OR does the patient have an upcoming appointment? Yes  Can we respond through MyChart? No  Agent: Please be advised that Rx refills may take up to 3 business days. We ask that you follow-up with your pharmacy.

## 2023-09-17 NOTE — Progress Notes (Signed)
Subjective:    Patient ID: Alicia Mccann, female    DOB: Oct 10, 1932, 89 y.o.   MRN: [id number]  Patient here for  Chief Complaint  Patient presents with   Medical Management of Chronic Issues    HPI Here for a scheduled follow up - follow up regarding increased stress and anxiety. She is accompanied by her friend and part time caretaker - Alona Bene. History obtained from both of them. Had f/u with cardiology 07/02/23 - continue aspirin and lipitor. Have discussed with her on multiple occasions regarding her stress/depression and living by herself. Gets lonely. Alona Bene coming one day per week now. Has been contacted by care coordinator team - Crystal. She reports that she tells them she is fine. She is not sure of medications she is taking. Not going out to visit friends, etc. Have discussed day programs. Have also discussed the possibility of assisted living. She wants to stay in her home. Discussed help in home and meals on wheels to confirm eating. Did have a fall one month ago. No residual pain or problems from the fall. No head injury. No chest pain. Breathing stable.     Past Medical History:  Diagnosis Date   Allergic rhinitis    Anemia    Anxiety    Cellulitis    Cholelithiasis    Essential hypertension    History of stress test    a. 12/2008 Ex MV: EF 78%, no ischemia.   Insomnia    Osteoarthritis    Tachycardia    Valvular heart disease    a. 12/2008 Echo: EF 65%, mild MR;  b. 2/6 SEM RUSB - insignificant murmur, prob Ao Sclerosis.   Past Surgical History:  Procedure Laterality Date   BACK SURGERY     BUNIONECTOMY     CATARACT EXTRACTION W/PHACO Right 05/18/2019   Procedure: CATARACT EXTRACTION PHACO AND INTRAOCULAR LENS PLACEMENT (IOC) RIGHT MALYUGIN  01:06.5  20.6%  13.72;  Surgeon: Lockie Mola, MD;  Location[facility];  Service: Ophthalmology;  Laterality: Right;  please leave patient arrival 10   CATARACT EXTRACTION W/PHACO Left 06/08/2019    Procedure: CATARACT EXTRACTION PHACO AND INTRAOCULAR LENS PLACEMENT (IOC) LEFT  00:51.0  20.3%  10.37;  Surgeon: Lockie Mola, MD;  Location: [facility];  Service: Ophthalmology;  Laterality: Left;  ARRIVAL 10:30 PLEASE LEAVE   CHOLECYSTECTOMY  03/29/2007   GALLBLADDER SURGERY     HIP SURGERY     bilateral    REPLACEMENT TOTAL KNEE Right    THUMB ARTHROSCOPY     Family History  Problem Relation Age of Onset   Heart attack Father    Stroke Father    Heart disease Father    CAD Father    Heart attack Son 62       MI   Hypertension Son    Hypertension Mother    Heart disease Mother    Stroke Mother    Breast cancer Neg Hx    Social History   Socioeconomic History   Marital status: Widowed    Spouse name: Not on file   Number of children: Not on file   Years of education: Not on file   Highest education level: Not on file  Occupational History   Not on file  Tobacco Use   Smoking status: Never   Smokeless tobacco: Never  Vaping Use   Vaping status: Never Used  Substance and Sexual Activity   Alcohol use: Yes    Comment: 2 oz  QOD   Drug use: No   Sexual activity: Not Currently  Other Topics Concern   Not on file  Social History Narrative   Not on file   Social Drivers of Health   Financial Resource Strain: Low Risk  (10/04/2021)   Overall Financial Resource Strain (CARDIA)    Difficulty of Paying Living Expenses: Not hard at all  Food Insecurity: No Food Insecurity (06/02/2023)   Hunger Vital Sign    Worried About Running Out of Food in the Last Year: Never true    Ran Out of Food in the Last Year: Never true  Transportation Needs: No Transportation Needs (06/02/2023)   PRAPARE - Administrator, Civil Service (Medical): No    Lack of Transportation (Non-Medical): No  Physical Activity: Insufficiently Active (10/04/2021)   Exercise Vital Sign    Days of Exercise per Week: 4 days    Minutes of Exercise per Session: 20 min  Stress: No  Stress Concern Present (10/04/2021)   [employer] of Occupational Health - Occupational Stress Questionnaire    Feeling of Stress : Not at all  Social Connections: Socially Isolated (06/02/2023)   Social Connection and Isolation Panel [NHANES]    Frequency of Communication with Friends and Family: More than three times a week    Frequency of Social Gatherings with Friends and Family: Once a week    Attends Religious Services: Never    Database administrator or Organizations: No    Attends Engineer, structural: Not on file    Marital Status: Widowed     Review of Systems  Constitutional:  Negative for unexpected weight change.       No reported appetite change.   HENT:  Negative for congestion.   Respiratory:  Negative for cough, chest tightness and shortness of breath.   Cardiovascular:  Negative for chest pain and palpitations.  Gastrointestinal:  Negative for abdominal pain, diarrhea, nausea and vomiting.  Genitourinary:  Negative for difficulty urinating and dysuria.  Musculoskeletal:  Negative for joint swelling and myalgias.  Skin:  Negative for color change and rash.  Neurological:  Negative for dizziness and headaches.  Psychiatric/Behavioral:  Negative for agitation and dysphoric mood.        Objective:     BP 126/72   Pulse 76   Temp 98.2 F (36.8 C)   Resp 16   Ht 5\' 2"  (1.575 m)   Wt 128 lb 9.6 oz (58.3 kg)   SpO2 98%   BMI 23.52 kg/m  Wt Readings from Last 3 Encounters:  09/17/23 128 lb 9.6 oz (58.3 kg)  07/09/23 128 lb 6.4 oz (58.2 kg)  07/02/23 127 lb 6.4 oz (57.8 kg)    Physical Exam Vitals reviewed.  Constitutional:      General: She is not in acute distress.    Appearance: Normal appearance.  HENT:     Head: Normocephalic and atraumatic.     Right Ear: External ear normal.     Left Ear: External ear normal.     Mouth/Throat:     Pharynx: No oropharyngeal exudate or posterior oropharyngeal erythema.  Eyes:     General: No  scleral icterus.       Right eye: No discharge.        Left eye: No discharge.     Conjunctiva/sclera: Conjunctivae normal.  Neck:     Thyroid: No thyromegaly.  Cardiovascular:     Rate and Rhythm: Normal rate and regular rhythm.  Pulmonary:     Effort: No respiratory distress.     Breath sounds: Normal breath sounds. No wheezing.  Abdominal:     General: Bowel sounds are normal.     Palpations: Abdomen is soft.     Tenderness: There is no abdominal tenderness.  Musculoskeletal:        General: No swelling or tenderness.     Cervical back: Neck supple. No tenderness.  Lymphadenopathy:     Cervical: No cervical adenopathy.  Skin:    Findings: No erythema or rash.  Neurological:     Mental Status: She is alert.  Psychiatric:        Mood and Affect: Mood normal.        Behavior: Behavior normal.         Outpatient Encounter Medications as of 09/17/2023  Medication Sig   acetaminophen (TYLENOL) 500 MG tablet Take 1,000 mg by mouth every 8 (eight) hours as needed for mild pain or moderate pain.   ALPRAZolam (XANAX) 0.25 MG tablet TAKE 1 TABLET BY MOUTH DAILY AS NEEDED FOR ANXIETY   amLODipine (NORVASC) 2.5 MG tablet TAKE 1 TABLET BY MOUTH DAILY   aspirin 81 MG EC tablet Take 81 mg by mouth daily. Swallow whole.   atorvastatin (LIPITOR) 10 MG tablet TAKE 1 TABLET BY MOUTH DAILY   cholecalciferol (VITAMIN D) 1000 UNITS tablet Take 1,000 Units by mouth every other day.    cyanocobalamin 100 MCG tablet Take 100 mcg by mouth daily.   sertraline (ZOLOFT) 25 MG tablet TAKE 1 TABLET BY MOUTH ONCE DAILY   triamterene-hydrochlorothiazide (MAXZIDE-25) 37.5-25 MG tablet TAKE 1 TABLET BY MOUTH DAILY   zolpidem (AMBIEN) 5 MG tablet Take 1 tablet (5 mg total) by mouth at bedtime as needed for sleep.   No facility-administered encounter medications on file as of 09/17/2023.     Lab Results  Component Value Date   WBC 9.1 11/06/2022   HGB 12.7 11/06/2022   HCT 38.9 11/06/2022   PLT  335.0 11/06/2022   GLUCOSE 71 07/09/2023   CHOL 153 07/09/2023   TRIG 89.0 07/09/2023   HDL 52.80 07/09/2023   LDLDIRECT 66.0 09/07/2018   LDLCALC 82 07/09/2023   ALT 11 07/09/2023   AST 15 07/09/2023   NA 140 07/09/2023   K 4.2 07/09/2023   CL 102 07/09/2023   CREATININE 1.08 07/09/2023   BUN 30 (H) 07/09/2023   CO2 30 07/09/2023   TSH 1.90 07/09/2023   INR 0.9 01/16/2009   HGBA1C 6.0 07/09/2023       Assessment & Plan:  Valvular heart disease Assessment & Plan: Had echo - aortic valve sclerosis.  Follow.    Need for shingles vaccine  Seasonal affective disorder [facility]) Assessment & Plan: Not sure if taking zoloft. Need to confirm. Her dog [facility]) has helped.  Overall appears to be doing some better today.  CCM referral has been placed. Crystal has reached out to her. Discussed today.  Would like home visit soon.  See if qualifies for meals on wheels.  Discussed day program - would like for her to participate.  Follow.    PAD (peripheral artery disease) (HCC) Assessment & Plan: Continue atorvastatin and aspirin.     Mixed hyperlipidemia Assessment & Plan: On lipitor.  Follow lipid panel and liver function tests.     Hyperglycemia Assessment & Plan: Follow met b and a1c.     Essential hypertension Assessment & Plan: Continue triam/hctz and amlodipine.  Follow pressures.  Follow metabolic  panel.    Falls Assessment & Plan: Has had a fall recently.  No significant injury.  Discussed falls.  Discussed living situation.  Not eating regular meals.  CCM referral in place. Crystal has reached out to her.  Discussed in home evaluation. Pt agreeable.    Depression, recurrent (HCC) Assessment & Plan: Discussed.  Discussed her increased anxiety/stress.  Discussed CCM referral/follow up.  Planning in home visit.  Discussed agreement with day program.  She is in agreement.  Follow. Continue zoloft. Need to confirm if taking.    Stage 3a chronic kidney disease  (HCC) Assessment & Plan: Continue to avoid antiinflammatories.  Stay hydrated.  Follow metabolic panel.    Aortic atherosclerosis (HCC) Assessment & Plan: Continue atorvastatin.    Anxiety Assessment & Plan: Continue zoloft. Need to confirm taking. Discussed that social interaction would help.        Dale [place], MD

## 2023-09-18 ENCOUNTER — Telehealth: Payer: Self-pay

## 2023-09-18 NOTE — Telephone Encounter (Signed)
Copied from CRM [id number]. Topic: General - Call Back - No Documentation >> Sep 18, 2023  2:34 PM Alicia Mccann wrote: Reason for CRM: Patient is requesting Mccann call back from [place], states she lost her direct number.

## 2023-09-20 ENCOUNTER — Encounter: Payer: Self-pay | Admitting: Internal Medicine

## 2023-09-20 NOTE — Assessment & Plan Note (Signed)
 Continue to avoid antiinflammatories.  Stay hydrated.  Follow metabolic panel.

## 2023-09-20 NOTE — Assessment & Plan Note (Signed)
Has had a fall recently.  No significant injury.  Discussed falls.  Discussed living situation.  Not eating regular meals.  CCM referral in place. Alicia Mccann has reached out to her.  Discussed in home evaluation. Pt agreeable.

## 2023-09-20 NOTE — Assessment & Plan Note (Signed)
 Continue atorvastatin

## 2023-09-20 NOTE — Assessment & Plan Note (Signed)
Not sure if taking zoloft. Need to confirm. Her dog [facility]) has helped.  Overall appears to be doing some better today.  CCM referral has been placed. Crystal has reached out to her. Discussed today.  Would like home visit soon.  See if qualifies for meals on wheels.  Discussed day program - would like for her to participate.  Follow.

## 2023-09-20 NOTE — Assessment & Plan Note (Signed)
On lipitor.  Follow lipid panel and liver function tests.   

## 2023-09-20 NOTE — Assessment & Plan Note (Signed)
Continue zoloft. Need to confirm taking. Discussed that social interaction would help.

## 2023-09-20 NOTE — Assessment & Plan Note (Signed)
 Continue triam/hctz and amlodipine.  Follow pressures.  Follow metabolic panel.

## 2023-09-20 NOTE — Assessment & Plan Note (Signed)
 Follow met b and a1c.

## 2023-09-20 NOTE — Assessment & Plan Note (Addendum)
Discussed.  Discussed her increased anxiety/stress.  Discussed CCM referral/follow up.  Planning in home visit.  Discussed agreement with day program.  She is in agreement.  Follow. Continue zoloft. Need to confirm if taking.

## 2023-09-20 NOTE — Assessment & Plan Note (Signed)
Continue atorvastatin and aspirin ?

## 2023-09-20 NOTE — Assessment & Plan Note (Signed)
Had echo - aortic valve sclerosis.  Follow.  

## 2023-09-21 ENCOUNTER — Telehealth: Payer: Self-pay | Admitting: *Deleted

## 2023-09-21 MED ORDER — SERTRALINE HCL 25 MG PO TABS: 25.0000 mg | ORAL_TABLET | Freq: Every day | ORAL | 1 refills | Status: DC

## 2023-09-21 NOTE — Patient Instructions (Signed)
Visit Information  Thank you for taking time to visit with me today. Please don't hesitate to contact me if I can be of assistance to you.   Following are the goals we discussed today:   Goals Addressed             This Visit's Progress    community resource needs       Activities and task to complete in order to accomplish goals.   EMOTIONAL / MENTAL HEALTH SUPPORT Self Support options  (continue to utilize coping strategies discussed today, consider increasing social activity, exercise , use of natural supports, increasing private duty care if possible and ongoing mental health follow up) Please follow up with long term care insurance plans regarding coverage for private duty care  Keep all upcoming appointment discussed today Continue to take all medications as prescribed           Our next appointment is by telephone on 09/28/23 at 11am  Please call the care guide team at [phone] if you need to cancel or reschedule your appointment.   If you are experiencing a Mental Health or Behavioral Health Crisis or need someone to talk to, please call 911   Patient verbalizes understanding of instructions and care plan provided today and agrees to view in MyChart. Active MyChart status and patient understanding of how to access instructions and care plan via MyChart confirmed with patient.     Telephone follow up appointment with care management team member scheduled for: 09/24/23  Verna Czech, LCSW [place]  [facility], [facility] Licensed Clinical Social Worker Care Coordinator  Direct Dial: [phone]

## 2023-09-21 NOTE — Patient Outreach (Addendum)
  Care Coordination   Follow Up Visit Note   09/21/2023 Name: Alicia Mccann MRN: [id number] DOB: [birth year]  Alicia Mccann is a 89 y.o. year old female who sees Dale [place], MD for primary care. I spoke with  Alicia Mccann by phone today.  What matters to the patients health and wellness today?  Managing symptoms of anxiety    Goals Addressed             This Visit's Progress    community resource needs       Activities and task to complete in order to accomplish goals.   EMOTIONAL / MENTAL HEALTH SUPPORT Self Support options  (continue to utilize coping strategies discussed today, consider increasing social activity, exercise , use of natural supports, increasing private duty care if possible and ongoing mental health follow up) Please follow up with long term care insurance plans regarding coverage for private duty care  Keep all upcoming appointment discussed today Continue to take all medications as prescribed           SDOH assessments and interventions completed:  No     Care Coordination Interventions:  Yes, provided  Interventions Today    Flowsheet Row Most Recent Value  Chronic Disease   Chronic disease during today's visit Other  [anxiety]  General Interventions   General Interventions Discussed/Reviewed General Interventions Reviewed, Level of Care, Doctor Visits  [continued assessment of community resource needs-pt states that her anxiety often flucuates-saw PCP yesterday-]  Doctor Visits Discussed/Reviewed PCP  [PCP yesterday 09/20/23]  Level of Care Personal Care Services  [Aid-Joyce 1 day per week on Thursdays 10-2pm would like her to come more often but finances are a concern]  Mental Health Interventions   Mental Health Discussed/Reviewed Mental Health Discussed, Coping Strategies, Anxiety  ["I get so blue, I worry about everything, not sure what to do" main trigger-finances-emotional support provided, coping strategies discussed ie  relaxation techniques, assertiveness, verbalization of feelings, medication adherence]  Pharmacy Interventions   Pharmacy Dicussed/Reviewed Pharmacy Topics Discussed, Medication Adherence  Safety Interventions   Safety Discussed/Reviewed Safety Discussed  [encouraged to use walker while ambulating to avoid falls]       Follow up plan: Follow up call scheduled for 09/28/23    Encounter Outcome:  Patient Visit Completed

## 2023-09-24 ENCOUNTER — Encounter: Payer: Self-pay | Admitting: *Deleted

## 2023-09-28 ENCOUNTER — Ambulatory Visit: Payer: Self-pay | Admitting: *Deleted

## 2023-09-28 NOTE — Patient Instructions (Signed)
 Visit Information  Thank you for taking time to visit with me today. Please don't hesitate to contact me if I can be of assistance to you.   Following are the goals we discussed today:   Goals Addressed             This Visit's Progress    community resource needs       Activities and task to complete in order to accomplish goals.   EMOTIONAL / MENTAL HEALTH SUPPORT Self Support options  (continue to utilize coping strategies discussed today, consider increasing social activity, exercise , use of natural supports, increasing private duty care if possible and ongoing mental health follow up) Please continue to follow up with long term care insurance plans regarding coverage for private duty care  Keep all upcoming appointment discussed today Continue to take all medications as prescribed           Our next appointment is by telephone on 10/19/23 at 10am  Please call the care guide team at [phone] if you need to cancel or reschedule your appointment.   If you are experiencing a Mental Health or Behavioral Health Crisis or need someone to talk to, please call the Suicide and Crisis Lifeline: 988   Patient verbalizes understanding of instructions and care plan provided today and agrees to view in MyChart. Active MyChart status and patient understanding of how to access instructions and care plan via MyChart confirmed with patient.     Telephone follow up appointment with care management team member scheduled for: 10/19/23  Michaelle Adolphus, LCSW [place]  [facility], [facility] Licensed Clinical Social Worker Care Coordinator  Direct Dial: [phone]

## 2023-09-28 NOTE — Patient Outreach (Signed)
  Care Coordination   Follow Up Visit Note   09/28/2023 Name: Alicia Mccann MRN: [id number] DOB: Feb 03, 1933  Alicia Mccann is a 89 y.o. year old female who sees Dellar Fenton, MD for primary care. I spoke with  [name] by phone today.  What matters to the patients health and wellness today?  Stabilization of anxiety symptoms    Goals Addressed             This Visit's Progress    community resource needs       Activities and task to complete in order to accomplish goals.   EMOTIONAL / MENTAL HEALTH SUPPORT Self Support options  (continue to utilize coping strategies discussed today, consider increasing social activity, exercise , use of natural supports, increasing private duty care if possible and ongoing mental health follow up) Please continue to follow up with long term care insurance plans regarding coverage for private duty care  Keep all upcoming appointment discussed today Continue to take all medications as prescribed           SDOH assessments and interventions completed:  No     Care Coordination Interventions:  Yes, provided  Interventions Today    Flowsheet Row Most Recent Value  Chronic Disease   Chronic disease during today's visit Other  [anxiety]  General Interventions   General Interventions Discussed/Reviewed General Interventions Reviewed, Doctor Visits  [follow up on management of anxiety symptoms]  Doctor Visits Discussed/Reviewed Doctor Visits Discussed  Level of Care Personal Care Services, Skilled Nursing Facility, Adult Daycare  Lenord Radon [name] continues to assist on Thursdays fron 10-2 neighbors also available to check in on patient to assist when needed-daughter continues to manage patient's money-continues to decline assisted living placement and resources for senior center]  Mental Health Interventions   Mental Health Discussed/Reviewed Mental Health Reviewed, Coping Strategies, Anxiety  [confirmed that axiety symptoms have  stabilized, reports feelings of loneliness but is able to verbalize insightful coping strategies ie adjusting perspective, medication adherance, increasing social interaction]  Pharmacy Interventions   Pharmacy Dicussed/Reviewed Pharmacy Topics Discussed, Medication Adherence  [enocuraged medication adherance, confirms that patient fills pills box weekly to increase adherance]  Safety Interventions   Safety Discussed/Reviewed Safety Discussed, Fall Risk  [fell last week, did not have her med alert on-recommended that she wear her med-alert consistently-neighbors came by to assist her following fall-now has a walker]       Follow up plan: Follow up call scheduled for 10/19/23    Encounter Outcome:  Patient Visit Completed

## 2023-10-12 ENCOUNTER — Telehealth: Payer: Self-pay | Admitting: *Deleted

## 2023-10-12 ENCOUNTER — Other Ambulatory Visit: Payer: Self-pay | Admitting: Internal Medicine

## 2023-10-12 NOTE — Telephone Encounter (Signed)
 LOV: 09/17/2023   NOV: 11/26/2023

## 2023-10-12 NOTE — Patient Outreach (Signed)
 Return phone call to patient. VM left requesting a return call.    Rockwell Zentz, LCSW [place]  [facility], [facility] Licensed Clinical Social Worker Care Coordinator  Direct Dial: [phone]

## 2023-10-13 ENCOUNTER — Other Ambulatory Visit: Payer: Self-pay | Admitting: Internal Medicine

## 2023-10-13 NOTE — Telephone Encounter (Signed)
 Duplicate. See other prescription request note.

## 2023-10-13 NOTE — Telephone Encounter (Signed)
 Alicia Mccann, see me about this before calling. Received request for xanax refill.  This was last refilled in 2023. Prefer not to refill this medication. Given increased stress, anxiety, I would like to refer her to psychiatry (or have her see a therapist - or both).  This has been an ongoing issue.

## 2023-10-15 DIAGNOSIS — Z08 Encounter for follow-up examination after completed treatment for malignant neoplasm: Secondary | ICD-10-CM | POA: Diagnosis not present

## 2023-10-15 DIAGNOSIS — D692 Other nonthrombocytopenic purpura: Secondary | ICD-10-CM | POA: Diagnosis not present

## 2023-10-15 DIAGNOSIS — L821 Other seborrheic keratosis: Secondary | ICD-10-CM | POA: Diagnosis not present

## 2023-10-15 DIAGNOSIS — R58 Hemorrhage, not elsewhere classified: Secondary | ICD-10-CM | POA: Diagnosis not present

## 2023-10-15 DIAGNOSIS — D485 Neoplasm of uncertain behavior of skin: Secondary | ICD-10-CM | POA: Diagnosis not present

## 2023-10-15 DIAGNOSIS — C44529 Squamous cell carcinoma of skin of other part of trunk: Secondary | ICD-10-CM | POA: Diagnosis not present

## 2023-10-15 DIAGNOSIS — Z85828 Personal history of other malignant neoplasm of skin: Secondary | ICD-10-CM | POA: Diagnosis not present

## 2023-10-19 ENCOUNTER — Ambulatory Visit: Payer: Self-pay | Admitting: *Deleted

## 2023-10-19 NOTE — Patient Outreach (Signed)
 Care Coordination   Follow Up Visit Note   10/19/2023 Name: CHIQUITTA MATTY MRN: [id number] DOB: 29-Jan-1933  MILDRETH REEK is a 89 y.o. year old female who sees Dale [place], MD for primary care. I spoke with  Lula Olszewski by phone today.  What matters to the patients health and wellness today?  Ongoing mental health follow up to manage anxiety    Goals Addressed             This Visit's Progress    community resource needs       Activities and task to complete in order to accomplish goals.   EMOTIONAL / MENTAL HEALTH SUPPORT Self Support options  (continue to utilize coping strategies discussed today, consider increasing social activity, exercise , use of natural supports, and ongoing mental health follow up) Please continue to follow up with long term care insurance plans regarding coverage for private duty care  Keep all upcoming appointment discussed today Continue to take all medications as prescribed Please contact your provider regarding medication re-fill for xanax           SDOH assessments and interventions completed:  No     Care Coordination Interventions:  Yes, provided  Interventions Today    Flowsheet Row Most Recent Value  Chronic Disease   Chronic disease during today's visit Other, Hypertension (HTN)  [anxiety]  General Interventions   General Interventions Discussed/Reviewed General Interventions Reviewed  [Follow up phone call to follow up on community and mental health needs. Confirms falling in the backyard over the weekend-had med-alert and was able to call-no injuries but sore-daughter continues to manage finances]  Doctor Visits Discussed/Reviewed Doctor Visits Discussed  [Dermatologist 2/27-removed growth on back of neck-will return for follow up  PCP 4/10]  Level of Care Personal Care Services  [patient has aid that comes on Tuesdays from 10-2pm but does not do housework, currently looking for someone affordable to assist with  housework]  Mental Health Interventions   Mental Health Discussed/Reviewed Mental Health Discussed, Coping Strategies, Anxiety  [continues to discuss challenges with family and finances-processed pt's feelings related to life challenges, emphasized priortizing tasks to avoid feeling overhwhelmed,]  Pharmacy Interventions   Pharmacy Dicussed/Reviewed Pharmacy Topics Discussed  [patient needs re-fill on xanax-patient agreeable to calling provider's office to request re-fill]  Safety Interventions   Safety Discussed/Reviewed Safety Discussed, Fall Risk  [encouraged patient to continue to use med-alert]       Follow up plan: Follow up call scheduled for 11/02/23    Encounter Outcome:  Patient Visit Completed

## 2023-10-19 NOTE — Patient Instructions (Signed)
 Visit Information  Thank you for taking time to visit with me today. Please don't hesitate to contact me if I can be of assistance to you.   Following are the goals we discussed today:   Goals Addressed             This Visit's Progress    community resource needs       Activities and task to complete in order to accomplish goals.   EMOTIONAL / MENTAL HEALTH SUPPORT Self Support options  (continue to utilize coping strategies discussed today, consider increasing social activity, exercise , use of natural supports, and ongoing mental health follow up) Please continue to follow up with long term care insurance plans regarding coverage for private duty care  Keep all upcoming appointment discussed today Continue to take all medications as prescribed Please contact your provider regarding medication re-fill for xanax           Our next appointment is by telephone on 11/02/23 at 10am  Please call the care guide team at [phone] if you need to cancel or reschedule your appointment.   If you are experiencing a Mental Health or Behavioral Health Crisis or need someone to talk to, please call 911   Patient verbalizes understanding of instructions and care plan provided today and agrees to view in MyChart. Active MyChart status and patient understanding of how to access instructions and care plan via MyChart confirmed with patient.     Telephone follow up appointment with care management team member scheduled for: 11/02/23  Verna Czech, LCSW [place]  [facility], [facility] Licensed Clinical Social Worker Care Coordinator  Direct Dial: [phone]

## 2023-10-21 ENCOUNTER — Other Ambulatory Visit: Payer: Self-pay | Admitting: Internal Medicine

## 2023-10-21 DIAGNOSIS — I10 Essential (primary) hypertension: Secondary | ICD-10-CM

## 2023-10-21 NOTE — Telephone Encounter (Signed)
 Copied from CRM [id number]. Topic: Clinical - Medication Refill >> Oct 21, 2023  2:04 PM Fredrich Romans wrote: Most Recent Primary Care Visit:  Provider: Dale [place]  Department: [facility]  Visit Type: OFFICE VISIT  Date: 09/17/2023  Medication:  amLODipine (NORVASC) 2.5 MG tablet   Has the patient contacted their pharmacy? Yes (Agent: If no, request that the patient contact the pharmacy for the refill. If patient does not wish to contact the pharmacy document the reason why and proceed with request.) (Agent: If yes, when and what did the pharmacy advise?)  Is this the correct pharmacy for this prescription? Yes If no, delete pharmacy and type the correct one.  This is the patient's preferred pharmacy:  [facility] - [place], [place] - [address] [place] [address] Phone: [phone] Fax: [phone]   Has the prescription been filled recently? No  Is the patient out of the medication? Yes  Has the patient been seen for an appointment in the last year OR does the patient have an upcoming appointment? Yes  Can we respond through MyChart? Yes  Agent: Please be advised that Rx refills may take up to 3 business days. We ask that you follow-up with your pharmacy.

## 2023-10-21 NOTE — Telephone Encounter (Unsigned)
 Copied from CRM [id number]. Topic: Clinical - Medication Question >> Oct 21, 2023  2:12 PM Fredrich Romans wrote: Reason for CRM: Patient is requesting a phone call from providers CMA to discuss medication refills.She was very confused as to what medications she needs refills on.

## 2023-10-27 ENCOUNTER — Telehealth: Payer: Self-pay

## 2023-10-27 NOTE — Telephone Encounter (Signed)
 Copied from CRM [id number]. Topic: Clinical - Medication Question >> Oct 27, 2023 12:36 PM Almira Coaster wrote: Reason for CRM: Patient is calling to due to receiving refills for medication on her list and she would like to speak with Dr.Scott or Bethann Berkshire to see if any changes have been made to the patients medication.

## 2023-10-28 NOTE — Telephone Encounter (Signed)
 Called patient. Discussed her medication. Reminded patient of all upcoming appts. Pt requested to see Dr Lorin Picket sooner than April, but needs Thurs appt. Scheduled appt for 3/27.

## 2023-11-02 ENCOUNTER — Ambulatory Visit: Payer: Self-pay | Admitting: *Deleted

## 2023-11-02 NOTE — Patient Instructions (Signed)
 Visit Information  Thank you for taking time to visit with me today. Please don't hesitate to contact me if I can be of assistance to you.   Following are the goals we discussed today:   Goals Addressed             This Visit's Progress    community resource needs       Activities and task to complete in order to accomplish goals.   EMOTIONAL / MENTAL HEALTH SUPPORT Self Support options  (continue to utilize coping strategies discussed today, consider increasing social activity, exercise , use of natural supports, and ongoing mental health follow up) Keep all upcoming appointment discussed today Continue to take all medications as prescribed Please contact your provider to discuss options for medication to address anxiety symptoms           Our next appointment is by telephone on 11/16/23 at 10am  Please call the care guide team at [phone] if you need to cancel or reschedule your appointment.   If you are experiencing a Mental Health or Behavioral Health Crisis or need someone to talk to, please call 911   Patient verbalizes understanding of instructions and care plan provided today and agrees to view in MyChart. Active MyChart status and patient understanding of how to access instructions and care plan via MyChart confirmed with patient.     Telephone follow up appointment with care management team member scheduled for: 11/16/23  Verna Czech, LCSW [place]  [facility], [facility] Licensed Clinical Social Worker Care Coordinator  Direct Dial: [phone]

## 2023-11-02 NOTE — Patient Outreach (Signed)
 Care Coordination   Follow Up Visit Note   11/02/2023 Name: Alicia Mccann MRN: [id number] DOB: [birth year]  Alicia Mccann is a 89 y.o. year old female who sees Dale [place], MD for primary care. I spoke with  Alicia Mccann by phone today.  What matters to the patients health and wellness today?  Management of anxiety symptoms and life challenges    Goals Addressed             This Visit's Progress    community resource needs       Activities and task to complete in order to accomplish goals.   EMOTIONAL / MENTAL HEALTH SUPPORT Self Support options  (continue to utilize coping strategies discussed today, consider increasing social activity, exercise , use of natural supports, and ongoing mental health follow up) Keep all upcoming appointment discussed today Continue to take all medications as prescribed Please contact your provider to discuss options for medication to address anxiety symptoms           SDOH assessments and interventions completed:  No     Care Coordination Interventions:  Yes, provided  Interventions Today    Flowsheet Row Most Recent Value  Chronic Disease   Chronic disease during today's visit Hypertension (HTN), Other  [anxiety]  General Interventions   General Interventions Discussed/Reviewed General Interventions Reviewed, Community Resources, Level of Care  [follow up regarding patient's mental health needs-continues to confirm having "gloomy" days- follow up with provider recommended for medication recommendations-confirmed plan to increase assistance in her home to decrease anxiety]  Doctor Visits Discussed/Reviewed Doctor Visits Reviewed  [PCP 3/27]  Level of Care Personal Care Services, Assisted Living  [Aid to continue to assist 1 day per week, housekeeper to be added, friend will assist with grocery shopping-discussed possiblity of Independent or assisted living]  Mental Health Interventions   Mental Health Discussed/Reviewed  Mental Health Reviewed, Coping Strategies, Anxiety  [pt continues to report challenges with managing anxiety due to financial and family conflicts-use of healthy coping strategies continue to be encouraged-reliance on additional supports discussed]  Pharmacy Interventions   Pharmacy Dicussed/Reviewed Pharmacy Topics Discussed  [encouraged patient to discussed possible medications to manage anxiety symptoms]       Follow up plan: Follow up call scheduled for 11/16/23    Encounter Outcome:  Patient Visit Completed

## 2023-11-04 ENCOUNTER — Telehealth: Payer: Self-pay | Admitting: Internal Medicine

## 2023-11-04 NOTE — Telephone Encounter (Signed)
 Tried calling patient, no answer on home and no voice box on cell. Wanted to let patient know that her 11/12/23 appointment has been canceled and Dr Lorin Picket will see her 11/26/2023 10 week follow up.

## 2023-11-12 ENCOUNTER — Ambulatory Visit: Admitting: Internal Medicine

## 2023-11-16 ENCOUNTER — Ambulatory Visit: Payer: Self-pay | Admitting: *Deleted

## 2023-11-16 NOTE — Patient Outreach (Signed)
 Care Coordination   Follow Up Visit Note   11/16/2023 Name: Alicia Mccann MRN: [id number] DOB: [birth year]  Alicia Mccann is a 89 y.o. year old female who sees Dale [place], MD for primary care. I spoke with  Alicia Mccann by phone today.  What matters to the patients health and wellness today?  Ongoing mental health follow up and support to manage to anxiety    Goals Addressed             This Visit's Progress    community resource needs       Activities and task to complete in order to accomplish goals.   EMOTIONAL / MENTAL HEALTH SUPPORT Self Support options  (continue to utilize coping strategies discussed today,  Keep all upcoming appointment discussed today Continue to take all medications as prescribed Please contact your provider to discuss options for medication to address anxiety symptoms as well as resources to assist with hearing aid re-placement           SDOH assessments and interventions completed:  No     Care Coordination Interventions:  Yes, provided  Interventions Today    Flowsheet Row Most Recent Value  Chronic Disease   Chronic disease during today's visit Hypertension (HTN)  [anxiety]  General Interventions   General Interventions Discussed/Reviewed General Interventions Reviewed, [employer], Level of Care, Doctor Visits  [Continued assessment of community resource/mental health needs. Pt continues to work to manage anxiety and family challenges, lost hearing aids, requesting resources to replace them]  Doctor Visits Discussed/Reviewed PCP  [4/10]  Level of Care Personal Care Services  [current aid has illness in family, daughter will come this weekend to assist her with any household needs, patient has also utilized a housekeeping service]  Mental Health Interventions   Mental Health Discussed/Reviewed Mental Health Reviewed, Coping Strategies, Anxiety  [patient continues to have challenges with anxiety, specifically as her  support availability has decreased, patient provided wih emotionnal support, and encouragement related to coping strategies used to manage anxiety and new challenges experienced]  Pharmacy Interventions   Pharmacy Dicussed/Reviewed Pharmacy Topics Discussed  Safety Interventions   Safety Discussed/Reviewed Safety Discussed       Follow up plan: Follow up call scheduled for 12/02/23    Encounter Outcome:  Patient Visit Completed

## 2023-11-16 NOTE — Patient Instructions (Signed)
 Visit Information  Thank you for taking time to visit with me today. Please don't hesitate to contact me if I can be of assistance to you.   Following are the goals we discussed today:   Goals Addressed             This Visit's Progress    community resource needs       Activities and task to complete in order to accomplish goals.   EMOTIONAL / MENTAL HEALTH SUPPORT Self Support options  (continue to utilize coping strategies discussed today,  Keep all upcoming appointment discussed today Continue to take all medications as prescribed Please contact your provider to discuss options for medication to address anxiety symptoms as well as resources to assist with hearing aid re-placement           Our next appointment is by telephone on 12/02/23 at 1pm  Please call the care guide team at [phone] if you need to cancel or reschedule your appointment.   If you are experiencing a Mental Health or Behavioral Health Crisis or need someone to talk to, please call the Suicide and Crisis Lifeline: 988   Patient verbalizes understanding of instructions and care plan provided today and agrees to view in MyChart. Active MyChart status and patient understanding of how to access instructions and care plan via MyChart confirmed with patient.     Telephone follow up appointment with care management team member scheduled for: 12/02/23  Verna Czech, LCSW [place]  [facility], [facility] Licensed Clinical Social Worker Care Coordinator  Direct Dial: [phone]

## 2023-11-26 ENCOUNTER — Ambulatory Visit: Payer: Medicare PPO | Admitting: Internal Medicine

## 2023-11-26 ENCOUNTER — Other Ambulatory Visit (INDEPENDENT_AMBULATORY_CARE_PROVIDER_SITE_OTHER)

## 2023-11-26 VITALS — BP 110/70 | HR 106 | Temp 97.6°F | Resp 17 | Ht 62.0 in | Wt 120.2 lb

## 2023-11-26 DIAGNOSIS — H9190 Unspecified hearing loss, unspecified ear: Secondary | ICD-10-CM | POA: Diagnosis not present

## 2023-11-26 DIAGNOSIS — I10 Essential (primary) hypertension: Secondary | ICD-10-CM

## 2023-11-26 DIAGNOSIS — I700 Atherosclerosis of aorta: Secondary | ICD-10-CM | POA: Diagnosis not present

## 2023-11-26 DIAGNOSIS — E782 Mixed hyperlipidemia: Secondary | ICD-10-CM

## 2023-11-26 DIAGNOSIS — R739 Hyperglycemia, unspecified: Secondary | ICD-10-CM | POA: Diagnosis not present

## 2023-11-26 DIAGNOSIS — F339 Major depressive disorder, recurrent, unspecified: Secondary | ICD-10-CM | POA: Diagnosis not present

## 2023-11-26 DIAGNOSIS — R296 Repeated falls: Secondary | ICD-10-CM | POA: Diagnosis not present

## 2023-11-26 DIAGNOSIS — F419 Anxiety disorder, unspecified: Secondary | ICD-10-CM

## 2023-11-26 DIAGNOSIS — N1831 Chronic kidney disease, stage 3a: Secondary | ICD-10-CM

## 2023-11-26 DIAGNOSIS — I739 Peripheral vascular disease, unspecified: Secondary | ICD-10-CM

## 2023-11-26 MED ORDER — SERTRALINE HCL 25 MG PO TABS: 25.0000 mg | ORAL_TABLET | Freq: Every day | ORAL | 1 refills | Status: DC

## 2023-11-26 NOTE — Progress Notes (Signed)
 Subjective:    Patient ID: Alicia Mccann, female    DOB: 25-Jan-1933, 89 y.o.   MRN: [id number]  Patient here for  Chief Complaint  Patient presents with   Medical Management of Chronic Issues    10 wk f/u-wants to discuss her hearing problems. Needs better aides.    HPI Here for a scheduled follow up - follow up regarding her blood pressure, increased stress. She is accompanied by her friend/part time helper. History obtained from both of them. No chest pain or sob reported. States she is eating. Weight is down. Previous weight 128lbs. She reports she has been going to the grocery store. Does not like to go. Has been preparing herself something to eat. Discussed with her regarding concern regarding her eating and taking her medications properly. She has fallen a few times since she was her  - per her friend. Denies any injury. Alicia Mccann in her yard on one occasion and fell backwards - pulling something out of the refrigerator. Her friend Alicia Mccann (who accompanies her today) is not going to be able to continue to come out to see her after this month. Discussed getting more help in the home or going to an assisted living. She refuses assisted living, but is agreeable to help in her home. Will need to discuss with her daughter.    Past Medical History:  Diagnosis Date   Allergic rhinitis    Anemia    Anxiety    Cellulitis    Cholelithiasis    Essential hypertension    History of stress test    a. 12/2008 Ex MV: EF 78%, no ischemia.   Insomnia    Osteoarthritis    Tachycardia    Valvular heart disease    a. 12/2008 Echo: EF 65%, mild MR;  b. 2/6 SEM RUSB - insignificant murmur, prob Ao Sclerosis.   Past Surgical History:  Procedure Laterality Date   BACK SURGERY     BUNIONECTOMY     CATARACT EXTRACTION W/PHACO Right 05/18/2019   Procedure: CATARACT EXTRACTION PHACO AND INTRAOCULAR LENS PLACEMENT (IOC) RIGHT MALYUGIN  01:06.5  20.6%  13.72;  Surgeon: Annell Kidney, MD;  Location:  [facility];  Service: Ophthalmology;  Laterality: Right;  please leave patient arrival 10   CATARACT EXTRACTION W/PHACO Left 06/08/2019   Procedure: CATARACT EXTRACTION PHACO AND INTRAOCULAR LENS PLACEMENT (IOC) LEFT  00:51.0  20.3%  10.37;  Surgeon: Annell Kidney, MD;  Location: [facility];  Service: Ophthalmology;  Laterality: Left;  ARRIVAL 10:30 PLEASE LEAVE   CHOLECYSTECTOMY  03/29/2007   GALLBLADDER SURGERY     HIP SURGERY     bilateral    REPLACEMENT TOTAL KNEE Right    THUMB ARTHROSCOPY     Family History  Problem Relation Age of Onset   Heart attack Father    Stroke Father    Heart disease Father    CAD Father    Heart attack Son 48       MI   Hypertension Son    Hypertension Mother    Heart disease Mother    Stroke Mother    Breast cancer Neg Hx    Social History   Socioeconomic History   Marital status: Widowed    Spouse name: Not on file   Number of children: Not on file   Years of education: Not on file   Highest education level: Not on file  Occupational History   Not on file  Tobacco Use   Smoking  status: Never   Smokeless tobacco: Never  Vaping Use   Vaping status: Never Used  Substance and Sexual Activity   Alcohol use: Yes    Comment: 2 oz QOD   Drug use: No   Sexual activity: Not Currently  Other Topics Concern   Not on file  Social History Narrative   Not on file   Social Drivers of Health   Financial Resource Strain: Low Risk  (10/04/2021)   Overall Financial Resource Strain (CARDIA)    Difficulty of Paying Living Expenses: Not hard at all  Food Insecurity: No Food Insecurity (06/02/2023)   Hunger Vital Sign    Worried About Running Out of Food in the Last Year: Never true    Ran Out of Food in the Last Year: Never true  Transportation Needs: No Transportation Needs (06/02/2023)   PRAPARE - Administrator, Civil Service (Medical): No    Lack of Transportation (Non-Medical): No  Physical Activity:  Insufficiently Active (10/04/2021)   Exercise Vital Sign    Days of Exercise per Week: 4 days    Minutes of Exercise per Session: 20 min  Stress: No Stress Concern Present (10/04/2021)   [employer] of Occupational Health - Occupational Stress Questionnaire    Feeling of Stress : Not at all  Social Connections: Socially Isolated (06/02/2023)   Social Connection and Isolation Panel [NHANES]    Frequency of Communication with Friends and Family: More than three times a week    Frequency of Social Gatherings with Friends and Family: Once a week    Attends Religious Services: Never    Database administrator or Organizations: No    Attends Engineer, structural: Not on file    Marital Status: Widowed     Review of Systems  Constitutional:        States she is eating. Weight loss as outlined.   HENT:  Negative for congestion and sinus pressure.   Respiratory:  Negative for cough, chest tightness and shortness of breath.   Cardiovascular:  Negative for chest pain, palpitations and leg swelling.  Gastrointestinal:  Negative for abdominal pain, diarrhea, nausea and vomiting.  Genitourinary:  Negative for dysuria.  Musculoskeletal:  Negative for joint swelling and myalgias.  Skin:  Negative for color change and rash.  Neurological:  Negative for dizziness and headaches.  Psychiatric/Behavioral:         Increased stress as outlined. Increased stress regarding not being able to do the things she used to do.         Objective:     BP 110/70 (Cuff Size: Normal)   Pulse (!) 106   Temp 97.6 F (36.4 C) (Oral)   Resp 17   Ht 5\' 2"  (1.575 m)   Wt 120 lb 4 oz (54.5 kg)   SpO2 98%   BMI 21.99 kg/m  Wt Readings from Last 3 Encounters:  11/26/23 120 lb 4 oz (54.5 kg)  09/17/23 128 lb 9.6 oz (58.3 kg)  07/09/23 128 lb 6.4 oz (58.2 kg)    Physical Exam Vitals reviewed.  Constitutional:      General: She is not in acute distress.    Appearance: Normal appearance.  HENT:      Head: Normocephalic and atraumatic.     Right Ear: External ear normal.     Left Ear: External ear normal.     Mouth/Throat:     Pharynx: No oropharyngeal exudate or posterior oropharyngeal erythema.  Eyes:  General: No scleral icterus.       Right eye: No discharge.        Left eye: No discharge.     Conjunctiva/sclera: Conjunctivae normal.  Neck:     Thyroid: No thyromegaly.  Cardiovascular:     Rate and Rhythm: Normal rate and regular rhythm.  Pulmonary:     Effort: No respiratory distress.     Breath sounds: Normal breath sounds. No wheezing.  Abdominal:     General: Bowel sounds are normal.     Palpations: Abdomen is soft.     Tenderness: There is no abdominal tenderness.  Musculoskeletal:        General: No swelling or tenderness.     Cervical back: Neck supple. No tenderness.  Lymphadenopathy:     Cervical: No cervical adenopathy.  Skin:    Findings: No erythema or rash.  Neurological:     Mental Status: She is alert.  Psychiatric:        Mood and Affect: Mood normal.        Behavior: Behavior normal.         Outpatient Encounter Medications as of 11/26/2023  Medication Sig   acetaminophen (TYLENOL) 500 MG tablet Take 1,000 mg by mouth every 8 (eight) hours as needed for mild pain or moderate pain.   ALPRAZolam (XANAX) 0.25 MG tablet TAKE 1 TABLET BY MOUTH DAILY AS NEEDED FOR ANXIETY   amLODipine (NORVASC) 2.5 MG tablet TAKE 1 TABLET BY MOUTH DAILY   aspirin 81 MG EC tablet Take 81 mg by mouth daily. Swallow whole.   atorvastatin (LIPITOR) 10 MG tablet TAKE 1 TABLET BY MOUTH DAILY   cholecalciferol (VITAMIN D) 1000 UNITS tablet Take 1,000 Units by mouth every other day.    cyanocobalamin 100 MCG tablet Take 100 mcg by mouth daily.   triamterene-hydrochlorothiazide (MAXZIDE-25) 37.5-25 MG tablet TAKE 1 TABLET BY MOUTH DAILY   zolpidem (AMBIEN) 5 MG tablet Take 1 tablet (5 mg total) by mouth at bedtime as needed for sleep.   sertraline (ZOLOFT) 25 MG  tablet Take 1 tablet (25 mg total) by mouth daily.   [DISCONTINUED] sertraline (ZOLOFT) 25 MG tablet Take 1 tablet (25 mg total) by mouth daily.   No facility-administered encounter medications on file as of 11/26/2023.     Lab Results  Component Value Date   WBC 12.7 (H) 11/26/2023   HGB 12.4 11/26/2023   HCT 37.4 11/26/2023   PLT 362.0 11/26/2023   GLUCOSE 85 11/26/2023   CHOL 119 11/26/2023   TRIG 118.0 11/26/2023   HDL 51.00 11/26/2023   LDLDIRECT 66.0 09/07/2018   LDLCALC 45 11/26/2023   ALT 12 11/26/2023   AST 17 11/26/2023   NA 137 11/26/2023   K 3.4 (L) 11/26/2023   CL 98 11/26/2023   CREATININE 1.15 11/26/2023   BUN 27 (H) 11/26/2023   CO2 29 11/26/2023   TSH 1.90 07/09/2023   INR 0.9 01/16/2009   HGBA1C 5.9 11/26/2023       Assessment & Plan:  Anxiety Assessment & Plan: Recommend to continue zoloft. Discussed her current living situation. Discussed my concern regarding her eating and taking her medications. Also discussed recent falls. Discussed recommendation for assisted living. She declines. Wants to remain in her home. Discussed the need to get help in the home. Agreeable. Will need to talk to her daughter. Social work involved.    Mixed hyperlipidemia -     Basic metabolic panel with GFR; Future -  CBC with Differential/Platelet; Future -     Hepatic function panel; Future -     Lipid panel; Future  Hyperglycemia Assessment & Plan: Follow met b and A1c. Has lost weight. Need to confirm eating regular meals.   Orders: -     Hemoglobin A1c; Future  Aortic atherosclerosis (HCC) Assessment & Plan: Continue lipitor.    Stage 3a chronic kidney disease (HCC) Assessment & Plan: Continue to avoid antiinflammatories.  Stay hydrated. Follow metabolic panel.    Decreased hearing, unspecified laterality Assessment & Plan: Discussed at length with her today. She has hearing aids, but does not feel they are working. Discussed referral to ENT.     Depression, recurrent (HCC) Assessment & Plan: Discussed with her today. Continue on zoloft. Social work involved. Needs further help in home. Follow.    Essential hypertension Assessment & Plan: Continue triam/hctz and amlodipine.  Follow pressures.  No change today.    Falls Assessment & Plan: Falls as outlined. No persistent injury. Discussed again regarding further help in home or assisted living.    PAD (peripheral artery disease) (HCC) Assessment & Plan: Continue lipitor and aspirin.    Other orders -     Sertraline HCl; Take 1 tablet (25 mg total) by mouth daily.  Dispense: 90 tablet; Refill: 1     Dellar Fenton, MD

## 2023-11-27 LAB — LIPID PANEL
Cholesterol: 119 mg/dL (ref 0–200)
HDL: 51 mg/dL (ref >39.00)
LDL Cholesterol: 45 mg/dL (ref 0–99)
NonHDL: 68.32
Total CHOL/HDL Ratio: 2
Triglycerides: 118 mg/dL (ref 0.0–149.0)
VLDL: 23.6 mg/dL (ref 0.0–40.0)

## 2023-11-27 LAB — BASIC METABOLIC PANEL WITH GFR
BUN: 27 mg/dL — ABNORMAL HIGH (ref 6–23)
CO2: 29 meq/L (ref 19–32)
Calcium: 9.8 mg/dL (ref 8.4–10.5)
Chloride: 98 meq/L (ref 96–112)
Creatinine, Ser: 1.15 mg/dL (ref 0.40–1.20)
GFR: 42.54 mL/min — ABNORMAL LOW (ref >60.00)
Glucose, Bld: 85 mg/dL (ref 70–99)
Potassium: 3.4 meq/L — ABNORMAL LOW (ref 3.5–5.1)
Sodium: 137 meq/L (ref 135–145)

## 2023-11-27 LAB — HEPATIC FUNCTION PANEL
ALT: 12 U/L (ref 0–35)
AST: 17 U/L (ref 0–37)
Albumin: 4.4 g/dL (ref 3.5–5.2)
Alkaline Phosphatase: 66 U/L (ref 39–117)
Bilirubin, Direct: 0.1 mg/dL (ref 0.0–0.3)
Total Bilirubin: 0.5 mg/dL (ref 0.2–1.2)
Total Protein: 6.5 g/dL (ref 6.0–8.3)

## 2023-11-27 LAB — CBC WITH DIFFERENTIAL/PLATELET
Basophils Absolute: 0.1 10*3/uL (ref 0.0–0.1)
Basophils Relative: 1 % (ref 0.0–3.0)
Eosinophils Absolute: 0.2 10*3/uL (ref 0.0–0.7)
Eosinophils Relative: 1.9 % (ref 0.0–5.0)
HCT: 37.4 % (ref 36.0–46.0)
Hemoglobin: 12.4 g/dL (ref 12.0–15.0)
Lymphocytes Relative: 16 % (ref 12.0–46.0)
Lymphs Abs: 2 10*3/uL (ref 0.7–4.0)
MCHC: 33.2 g/dL (ref 30.0–36.0)
MCV: 88.6 fl (ref 78.0–100.0)
Monocytes Absolute: 0.7 10*3/uL (ref 0.1–1.0)
Monocytes Relative: 5.7 % (ref 3.0–12.0)
Neutro Abs: 9.6 10*3/uL — ABNORMAL HIGH (ref 1.4–7.7)
Neutrophils Relative %: 75.4 % (ref 43.0–77.0)
Platelets: 362 10*3/uL (ref 150.0–400.0)
RBC: 4.22 Mil/uL (ref 3.87–5.11)
RDW: 12.6 % (ref 11.5–15.5)
WBC: 12.7 10*3/uL — ABNORMAL HIGH (ref 4.0–10.5)

## 2023-11-27 LAB — HEMOGLOBIN A1C: Hgb A1c MFr Bld: 5.9 % (ref 4.6–6.5)

## 2023-11-29 ENCOUNTER — Encounter: Payer: Self-pay | Admitting: Internal Medicine

## 2023-11-29 ENCOUNTER — Other Ambulatory Visit: Payer: Self-pay | Admitting: Internal Medicine

## 2023-11-29 DIAGNOSIS — E876 Hypokalemia: Secondary | ICD-10-CM

## 2023-11-29 DIAGNOSIS — N1831 Chronic kidney disease, stage 3a: Secondary | ICD-10-CM

## 2023-11-29 DIAGNOSIS — D72829 Elevated white blood cell count, unspecified: Secondary | ICD-10-CM

## 2023-11-29 NOTE — Assessment & Plan Note (Signed)
 Falls as outlined. No persistent injury. Discussed again regarding further help in home or assisted living.

## 2023-11-29 NOTE — Progress Notes (Signed)
Order placed for f/u labs.  

## 2023-11-29 NOTE — Assessment & Plan Note (Signed)
 Continue lipitor  ?

## 2023-11-29 NOTE — Assessment & Plan Note (Signed)
Continue lipitor and aspirin.  ?

## 2023-11-29 NOTE — Assessment & Plan Note (Signed)
 Discussed with her today. Continue on zoloft. Social work involved. Needs further help in home. Follow.

## 2023-11-29 NOTE — Assessment & Plan Note (Signed)
 Discussed at length with her today. She has hearing aids, but does not feel they are working. Discussed referral to ENT.

## 2023-11-29 NOTE — Assessment & Plan Note (Signed)
 Continue to avoid antiinflammatories.  Stay hydrated.  Follow metabolic panel.

## 2023-11-29 NOTE — Assessment & Plan Note (Signed)
 Follow met b and A1c. Has lost weight. Need to confirm eating regular meals.

## 2023-11-29 NOTE — Assessment & Plan Note (Signed)
 Recommend to continue zoloft. Discussed her current living situation. Discussed my concern regarding her eating and taking her medications. Also discussed recent falls. Discussed recommendation for assisted living. She declines. Wants to remain in her home. Discussed the need to get help in the home. Agreeable. Will need to talk to her daughter. Social work involved.

## 2023-11-29 NOTE — Assessment & Plan Note (Signed)
 Continue triam/hctz and amlodipine.  Follow pressures.  No change today.

## 2023-12-02 ENCOUNTER — Ambulatory Visit: Payer: Self-pay | Admitting: *Deleted

## 2023-12-02 ENCOUNTER — Telehealth: Payer: Self-pay | Admitting: Internal Medicine

## 2023-12-02 NOTE — Telephone Encounter (Signed)
 Called and spoke to Ms Muldrew's daughter Cary Clarks. Discussed getting help in the home and Meals on Wheels, etc. Ms Loisel declines assisted living, but was agreeable to having help come into the home. Cary Clarks is going to check on Ms Santagata's long term care policy and get back with me. Also, will let me know about meals on wheels and other resources.

## 2023-12-02 NOTE — Patient Outreach (Signed)
 Phone call to patient for scheduled appointment, however patient was on her way out to appointment and was not able to talk. Appointment re-scheduled for 12/16/23 10am.   Michaelle Adolphus, LCSW [place]  [facility], [facility] Licensed Clinical Social Worker Care Coordinator  Direct Dial: [phone]

## 2023-12-02 NOTE — Telephone Encounter (Unsigned)
 Copied from CRM [id number]. Topic: General - Call Back - No Documentation >> Dec 02, 2023  3:53 PM Freya Jesus wrote: Reason for CRM: Patient daughter returning missed call from Dr. Geralyn Knee. Called CAL was advised provider is out of the office for the day and will send a message over.

## 2023-12-02 NOTE — Telephone Encounter (Signed)
 Called and left message with Gloria Lares (pts daughter) regarding her last office visit.

## 2023-12-10 DIAGNOSIS — L905 Scar conditions and fibrosis of skin: Secondary | ICD-10-CM | POA: Diagnosis not present

## 2023-12-16 ENCOUNTER — Telehealth: Payer: Self-pay | Admitting: *Deleted

## 2023-12-16 NOTE — Patient Outreach (Signed)
 Follow up phone call to patient, however patient was in the shower and asked that appointment be re-scheduled. Patient did report that she has a new aid that she finds helpful. Appointment re-scheduled for 12/24/23 at 12pm.   Michaelle Adolphus, LCSW [place]  [facility], [facility] Licensed Clinical Social Worker Care Coordinator  Direct Dial: [phone]

## 2023-12-17 ENCOUNTER — Other Ambulatory Visit

## 2023-12-17 ENCOUNTER — Encounter

## 2023-12-22 ENCOUNTER — Other Ambulatory Visit: Payer: Self-pay | Admitting: Cardiovascular Disease

## 2023-12-22 DIAGNOSIS — I10 Essential (primary) hypertension: Secondary | ICD-10-CM

## 2023-12-24 ENCOUNTER — Other Ambulatory Visit: Payer: Self-pay | Admitting: *Deleted

## 2023-12-24 NOTE — Patient Outreach (Addendum)
 Complex Care Management   Visit Note  02/08/2024  Name:  Alicia Mccann MRN: [id number] DOB: 03-07-1933  Situation: Referral received for Complex Care Management related to [place]/Behavioral Health diagnosis depression and anxiety I obtained verbal consent from Patient.  Visit completed with patient  on the phone  Background:   Past Medical History:  Diagnosis Date   Allergic rhinitis    Anemia    Anxiety    Cellulitis    Cholelithiasis    Essential hypertension    History of stress test    a. 12/2008 Ex MV: EF 78%, no ischemia.   Insomnia    Osteoarthritis    Tachycardia    Valvular heart disease    a. 12/2008 Echo: EF 65%, mild MR;  b. 2/6 SEM RUSB - insignificant murmur, prob Ao Sclerosis.    Assessment: Patient Reported Symptoms:  Cognitive Cognitive Status: Alert and oriented to person, place, and time, Insightful and able to interpret abstract concepts, Normal speech and language skills      Neurological Neurological Review of Symptoms: No symptoms reported    HEENT        Cardiovascular Cardiovascular Symptoms Reported: No symptoms reported Does patient have uncontrolled Hypertension?: No Cardiovascular Conditions: Hypertension Cardiovascular Management Strategies: Medication therapy  Respiratory Respiratory Symptoms Reported: No symptoms reported    Endocrine Patient reports the following symptoms related to hypoglycemia or hyperglycemia : No symptoms reported Is patient diabetic?: No    Gastrointestinal Gastrointestinal Symptoms Reported: No symptoms reported Additional Gastrointestinal Details: frequent urination   Nutrition Risk Screen (CP): No indicators present  Genitourinary Genitourinary Symptoms Reported: No symptoms reported    Integumentary Integumentary Symptoms Reported: Skin changes Additional Integumentary Details: had growth removed from the back of her neck-per patient it was pre-cancerous Skin Comment: Dr. Dela removed growth from  neck-will return in 6 months  Musculoskeletal Musculoskelatal Symptoms Reviewed: No symptoms reported        Psychosocial       Quality of Family Relationships: supportive Do you feel physically threatened by others?: No      12/24/2023   12:34 PM  Depression screen PHQ 2/9  Decreased Interest 2  Down, Depressed, Hopeless 3  PHQ - 2 Score 5  Altered sleeping 0  Tired, decreased energy 1  Change in appetite 1  Feeling bad or failure about yourself  0  Trouble concentrating 1  Suicidal thoughts 0  PHQ-9 Score 8  Difficult doing work/chores Very difficult    There were no vitals filed for this visit.  Medications Reviewed Today     Reviewed by Ermalinda Lenn HERO, LCSW (Social Worker) on 12/24/23 at 1233  Med List Status: <None>   Medication Order Taking? Sig Documenting Provider Last Dose Status Informant  acetaminophen  (TYLENOL ) 500 MG tablet [id number] Yes Take 1,000 mg by mouth every 8 (eight) hours as needed for mild pain or moderate pain. [provider] Taking Active Other  ALPRAZolam  (XANAX ) 0.25 MG tablet [id number] Yes TAKE 1 TABLET BY MOUTH DAILY AS NEEDED FOR ANXIETY Glendia Shad, MD Taking Active   amLODipine  (NORVASC ) 2.5 MG tablet [id number] Yes TAKE 1 TABLET BY MOUTH DAILY Gollan, Timothy J, MD Taking Active   aspirin  81 MG EC tablet [id number] Yes Take 81 mg by mouth daily. Swallow whole. [provider] Taking Active Other  atorvastatin  (LIPITOR) 10 MG tablet [id number] Yes TAKE 1 TABLET BY MOUTH DAILY Gollan, Timothy J, MD Taking Active   cholecalciferol  (VITAMIN D ) 1000 UNITS tablet [id number] Yes Take  1,000 Units by mouth every other day.  [provider] Taking Active Other  cyanocobalamin  100 MCG tablet [id number] Yes Take 100 mcg by mouth daily. [provider] Taking Active Other  sertraline  (ZOLOFT ) 25 MG tablet [id number] Yes Take 1 tablet (25 mg total) by mouth daily. Glendia Shad, MD Taking Active    triamterene -hydrochlorothiazide  (MAXZIDE -25) 37.5-25 MG tablet [id number] Yes TAKE 1 TABLET BY MOUTH DAILY Gollan, Timothy J, MD Taking Active   zolpidem  (AMBIEN ) 5 MG tablet [id number] Yes Take 1 tablet (5 mg total) by mouth at bedtime as needed for sleep. Kasa, Kurian, MD Taking Active             Recommendation:   PCP Follow-up  Follow Up Plan:   Telephone follow-up 02/25/24   Lenn Mean, LCSW [place]  [facility], [facility] Licensed Clinical Social Worker Care Coordinator  Direct Dial: [phone]

## 2023-12-24 NOTE — Patient Instructions (Signed)
 Visit Information  Thank you for taking time to visit with me today. Please don't hesitate to contact me if I can be of assistance to you before our next scheduled appointment.  Our next appointment is by telephone on 02/16/14 at 1pm Please call the care guide team at [phone] if you need to cancel or reschedule your appointment.   Following is a copy of your care plan:   Goals Addressed             This Visit's Progress    VBCI Social Work Care Plan       Problems:   Disease Management support and education needs related to Anxiety with Excessive Worry,  CSW Clinical Goal(s):   Over the next 90 days the Patient will demonstrate a reduction in symptoms related to Anxiety with Excessive Worry, Depression: difficulty concentrating impaired memory loss of energy/fatigue .  Interventions:  Mental Health:  Evaluation of current treatment plan related to Anxiety and Depression Active listening / Reflection utilized Behavioral Activation reviewed Emotional Support Provided Motivational Interviewing employed Participation in counseling encourage : patient continues to decline referral for ongoing mental health follow up PHQ2/PHQ9 completed GAD 7 completed Solution-Focued Strategies employed: ie prioritizing tasks, adjusting perspective, use of positive self talk consideration of increasing socialization([facility], [facility]), reaching utilization of aid for ongoing support(now has new aid 2x per week) and monthly housekeeping  Patient Goals/Self-Care Activities:  Continue taking your medication as prescribed.   Schedule follow up with your eye doctor, dentist and ENT   Plan:   Telephone follow up appointment with care management team member scheduled for:  01/18/24        Please call 911 if you are experiencing a Mental Health or Behavioral Health Crisis or need someone to talk to.  Patient verbalizes understanding of instructions and care plan provided  today and agrees to view in MyChart. Active MyChart status and patient understanding of how to access instructions and care plan via MyChart confirmed with patient.      Alicia Uram, LCSW [place]  [facility], [facility] Licensed Clinical Social Worker Care Coordinator  Direct Dial: [phone]

## 2024-01-13 DIAGNOSIS — H903 Sensorineural hearing loss, bilateral: Secondary | ICD-10-CM | POA: Diagnosis not present

## 2024-01-18 ENCOUNTER — Telehealth: Payer: Self-pay | Admitting: *Deleted

## 2024-01-18 ENCOUNTER — Encounter: Payer: Self-pay | Admitting: *Deleted

## 2024-01-21 DIAGNOSIS — H903 Sensorineural hearing loss, bilateral: Secondary | ICD-10-CM | POA: Diagnosis not present

## 2024-02-01 ENCOUNTER — Telehealth: Payer: Self-pay

## 2024-02-01 NOTE — Telephone Encounter (Signed)
 Copied from CRM [id number]. Topic: Appointments - Scheduling Inquiry for Clinic >> Feb 01, 2024  4:06 PM [name] wrote: Reason for CRM: Pt states she had a fall 2 weeks ago and did not go to the hospital or ER for it, but she is very sore. She wants to see if she can come in sooner than her 7/10 appt but the only one with Dr. Geralyn Knee currently is 7/2 at [facility]. Any Tuesday or Thursday later in the day but if not any Tuesday or Thurday  The problem is that she only has help to get her to and from her appts on Tuesdays and Thursdays so this is the only time she is available to come in. Called the CAL to see if we could get her worked in and [employer] recommended to send a message back to see what options we could do for her.  Please call Jacque back at [phone] if there is any way we can see her sooner on a Tuesday or Thursday.  Thank you so much!

## 2024-02-02 NOTE — Telephone Encounter (Signed)
 Called patient , unable to leave message

## 2024-02-03 NOTE — Telephone Encounter (Signed)
 Called patient , unable to leave message

## 2024-02-05 NOTE — Telephone Encounter (Unsigned)
 Copied from CRM [id number]. Topic: General - Other >> Feb 05, 2024 12:53 PM Alicia Mccann wrote: Reason for CRM: Pt is requesting to speak with Dr.Scott's nurse Milana Ali. Pt stated that she isn't feeling well, has body aches, and fell last week. Pt wanted to schedule an appt with Dr.Scott but she didn't have any available until August. Pt would like a callback today if possible.   ----------------------------------------------------------------------- From previous Reason for Contact - Scheduling: Patient/patient representative is calling to schedule an appointment. Refer to attachments for appointment information.

## 2024-02-08 NOTE — Telephone Encounter (Signed)
 Noted. If any acute change - will need evaluation prior

## 2024-02-08 NOTE — Telephone Encounter (Signed)
 Pt is aware.

## 2024-02-08 NOTE — Telephone Encounter (Signed)
 FYI- Called and confirmed patient is doing ok. (Spoke to caregiver as well) No lingering issues from fall. Has normal aches and pains. Uses tylenol . Offered her earlier appointment with another provider to be seen for the fall. Patient declined and says she just wanted to talk to Dr glendia about her medications and try to fix why she is feeling so blue all the time. She is taking her zoloft . I have moved her appt to 7/7.

## 2024-02-11 ENCOUNTER — Telehealth: Payer: Self-pay | Admitting: *Deleted

## 2024-02-11 NOTE — Progress Notes (Signed)
 Complex Care Management Care Guide Note  02/11/2024 Name: [name] MRN: [id number] DOB: 1933-04-04  Alicia Mccann is a 89 y.o. year old female who is a primary care patient of Glendia Shad, MD and is actively engaged with the care management team. I reached out to Kimila L Baumgardner by phone today to assist with re-scheduling  with the Licensed Clinical Child psychotherapist.  Follow up plan: pt declined to reschedule   Thedford Franks, CMA [facility]  [facility], [facility] Guide Direct Dial: [phone]  Fax: [phone] Website: [email or web address]

## 2024-02-12 NOTE — Patient Instructions (Signed)
 Visit Information  Thank you for taking time to visit with me today. Please don't hesitate to contact me if I can be of assistance to you before our next scheduled appointment.  Your next care management appointment is person on 03/01/24 at 1pm  Face to Face appointment date/time: 03/01/24 1pm  Please call the care guide team at [phone] if you need to cancel, schedule, or reschedule an appointment.   Please call the Suicide and Crisis Lifeline: 988 if you are experiencing a Mental Health or Behavioral Health Crisis or need someone to talk to.  Ciel Chervenak, LCSW [place]  [facility], [facility] Licensed Clinical Social Worker  Direct Dial: [phone]

## 2024-02-12 NOTE — Patient Outreach (Addendum)
 Complex Care Management   Visit Note  02/12/2024  Name:  Alicia Mccann MRN: [id number] DOB: 23-Apr-1933  Situation: Referral received for Complex Care Management related to Mental/Behavioral Health diagnosis anxiety I obtained verbal consent from Patient.  Visit completed with patient  on the phone on 02/11/24.  Background:   Past Medical History:  Diagnosis Date   Allergic rhinitis    Anemia    Anxiety    Cellulitis    Cholelithiasis    Essential hypertension    History of stress test    a. 12/2008 Ex MV: EF 78%, no ischemia.   Insomnia    Osteoarthritis    Tachycardia    Valvular heart disease    a. 12/2008 Echo: EF 65%, mild MR;  b. 2/6 SEM RUSB - insignificant murmur, prob Ao Sclerosis.    Assessment: Patient Reported Symptoms:  Cognitive Cognitive Status: Alert and oriented to person, place, and time, Insightful and able to interpret abstract concepts, Normal speech and language skills   Health Maintenance Behaviors: Annual physical exam, Stress management Healing Pattern: Average Health Facilitated by: Stress management  Neurological Neurological Review of Symptoms: No symptoms reported    HEENT   HEENT Management Strategies: Medical device HEENT Comment: Has hearing aids but do not work well, working on replacements    Cardiovascular Cardiovascular Symptoms Reported: No symptoms reported    Respiratory Respiratory Symptoms Reported: No symptoms reported    Endocrine Patient reports the following symptoms related to hypoglycemia or hyperglycemia : No symptoms reported    Gastrointestinal Gastrointestinal Symptoms Reported: No symptoms reported Additional Gastrointestinal Details: frequetn urination   Nutrition Risk Screen (CP): No indicators present  Genitourinary Genitourinary Symptoms Reported: Frequency Genitourinary Conditions: Frequency  Integumentary Integumentary Symptoms Reported: No symptoms reported    Musculoskeletal Musculoskelatal Symptoms  Reviewed: Weakness Additional Musculoskeletal Details: Pain in knees, fears falling, uses walker to ambulate Musculoskeletal Conditions: Mobility limited      Psychosocial Psychosocial Symptoms Reported: Anxiety - if selected complete GAD Behavioral Health Conditions: Anxiety Behavioral Management Strategies: Adequate rest, Coping strategies, Counseling, Medication therapy Behavioral Health Comment: inceased anxiety, criying spells, challenges with minimal social support Major Change/Loss/Stressor/Fears (CP): Relationship concerns Techniques to Cope with Loss/Stress/Change: Diversional activities, Medication Quality of Family Relationships: supportive Do you feel physically threatened by others?: No      12/24/2023   12:34 PM  Depression screen PHQ 2/9  Decreased Interest 2  Down, Depressed, Hopeless 3  PHQ - 2 Score 5  Altered sleeping 0  Tired, decreased energy 1  Change in appetite 1  Feeling bad or failure about yourself  0  Trouble concentrating 1  Suicidal thoughts 0  PHQ-9 Score 8  Difficult doing work/chores Very difficult    There were no vitals filed for this visit.  Medications Reviewed Today   Medications were not reviewed in this encounter     Recommendation:   PCP Follow-up Specialty provider follow-up as scheduled Continue to consider ongoing mental health support  Follow Up Plan:   Face to Face appointment date/time: 03/01/24  Lenn Mean, LCSW [place]  [facility], [facility] Licensed Clinical Social Worker  Direct Dial: [phone]

## 2024-02-15 ENCOUNTER — Encounter: Payer: Self-pay | Admitting: Internal Medicine

## 2024-02-16 NOTE — Telephone Encounter (Signed)
 Please call the daughter and see if there is something she is needing completed or specifically done  - so that we can address during her appt.

## 2024-02-22 ENCOUNTER — Encounter: Payer: Self-pay | Admitting: Internal Medicine

## 2024-02-22 ENCOUNTER — Ambulatory Visit: Admitting: Internal Medicine

## 2024-02-22 VITALS — BP 134/72 | HR 77 | Ht 62.0 in | Wt 118.6 lb

## 2024-02-22 DIAGNOSIS — R413 Other amnesia: Secondary | ICD-10-CM | POA: Diagnosis not present

## 2024-02-22 DIAGNOSIS — I700 Atherosclerosis of aorta: Secondary | ICD-10-CM | POA: Diagnosis not present

## 2024-02-22 DIAGNOSIS — F419 Anxiety disorder, unspecified: Secondary | ICD-10-CM | POA: Diagnosis not present

## 2024-02-22 DIAGNOSIS — D649 Anemia, unspecified: Secondary | ICD-10-CM | POA: Diagnosis not present

## 2024-02-22 DIAGNOSIS — I739 Peripheral vascular disease, unspecified: Secondary | ICD-10-CM

## 2024-02-22 DIAGNOSIS — N1831 Chronic kidney disease, stage 3a: Secondary | ICD-10-CM

## 2024-02-22 DIAGNOSIS — F339 Major depressive disorder, recurrent, unspecified: Secondary | ICD-10-CM

## 2024-02-22 DIAGNOSIS — E876 Hypokalemia: Secondary | ICD-10-CM | POA: Diagnosis not present

## 2024-02-22 DIAGNOSIS — I10 Essential (primary) hypertension: Secondary | ICD-10-CM | POA: Diagnosis not present

## 2024-02-22 DIAGNOSIS — R739 Hyperglycemia, unspecified: Secondary | ICD-10-CM

## 2024-02-22 DIAGNOSIS — D72829 Elevated white blood cell count, unspecified: Secondary | ICD-10-CM

## 2024-02-22 DIAGNOSIS — R296 Repeated falls: Secondary | ICD-10-CM

## 2024-02-22 LAB — CBC WITH DIFFERENTIAL/PLATELET
Basophils Absolute: 0.1 K/uL (ref 0.0–0.1)
Basophils Relative: 0.9 % (ref 0.0–3.0)
Eosinophils Absolute: 0.2 K/uL (ref 0.0–0.7)
Eosinophils Relative: 1.7 % (ref 0.0–5.0)
HCT: 31.4 % — ABNORMAL LOW (ref 36.0–46.0)
Hemoglobin: 10.3 g/dL — ABNORMAL LOW (ref 12.0–15.0)
Lymphocytes Relative: 18.9 % (ref 12.0–46.0)
Lymphs Abs: 2.2 K/uL (ref 0.7–4.0)
MCHC: 32.6 g/dL (ref 30.0–36.0)
MCV: 87.7 fl (ref 78.0–100.0)
Monocytes Absolute: 0.7 K/uL (ref 0.1–1.0)
Monocytes Relative: 6.4 % (ref 3.0–12.0)
Neutro Abs: 8.3 K/uL — ABNORMAL HIGH (ref 1.4–7.7)
Neutrophils Relative %: 72.1 % (ref 43.0–77.0)
Platelets: 390 K/uL (ref 150.0–400.0)
RBC: 3.59 Mil/uL — ABNORMAL LOW (ref 3.87–5.11)
RDW: 13.3 % (ref 11.5–15.5)
WBC: 11.5 K/uL — ABNORMAL HIGH (ref 4.0–10.5)

## 2024-02-22 LAB — BASIC METABOLIC PANEL WITH GFR
BUN: 27 mg/dL — ABNORMAL HIGH (ref 6–23)
CO2: 28 meq/L (ref 19–32)
Calcium: 10.1 mg/dL (ref 8.4–10.5)
Chloride: 102 meq/L (ref 96–112)
Creatinine, Ser: 0.97 mg/dL (ref 0.40–1.20)
GFR: 52.09 mL/min — ABNORMAL LOW (ref >60.00)
Glucose, Bld: 88 mg/dL (ref 70–99)
Potassium: 3.5 meq/L (ref 3.5–5.1)
Sodium: 139 meq/L (ref 135–145)

## 2024-02-22 NOTE — Progress Notes (Signed)
 Subjective:    Patient ID: Alicia Mccann, female    DOB: [birth year], 89 y.o.   MRN: [id number]  Patient here for  Chief Complaint  Patient presents with   Medical Management of Chronic Issues    HPI Here for a scheduled follow up - follow up regarding her blood pressure and increased stress. She is accompanied by her caretaker. History obtained from both of them. She has lost more weight. Discussed eating. Also discussed concern regarding her safety. She has fallen again. No residual pain or injuries. She reports she has good neighbors that she can talk to, but then will also state she is lonely. Some depression. No SI. She does repeat things. We discussed memory issues. Breathing stable. No abdominal pain or bowel change reported. Discussed assisted living. She declines. Wants to stay at home. Is agreeable to having caretakers.    Past Medical History:  Diagnosis Date   Allergic rhinitis    Anemia    Anxiety    Cellulitis    Cholelithiasis    Essential hypertension    History of stress test    a. 12/2008 Ex MV: EF 78%, no ischemia.   Insomnia    Osteoarthritis    Tachycardia    Valvular heart disease    a. 12/2008 Echo: EF 65%, mild MR;  b. 2/6 SEM RUSB - insignificant murmur, prob Ao Sclerosis.   Past Surgical History:  Procedure Laterality Date   BACK SURGERY     BUNIONECTOMY     CATARACT EXTRACTION W/PHACO Right 05/18/2019   Procedure: CATARACT EXTRACTION PHACO AND INTRAOCULAR LENS PLACEMENT (IOC) RIGHT MALYUGIN  01:06.5  20.6%  13.72;  Surgeon: Mittie Gaskin, MD;  Location: [facility];  Service: Ophthalmology;  Laterality: Right;  please leave patient arrival 10   CATARACT EXTRACTION W/PHACO Left 06/08/2019   Procedure: CATARACT EXTRACTION PHACO AND INTRAOCULAR LENS PLACEMENT (IOC) LEFT  00:51.0  20.3%  10.37;  Surgeon: Mittie Gaskin, MD;  Location: [facility];  Service: Ophthalmology;  Laterality: Left;  ARRIVAL 10:30 PLEASE LEAVE    CHOLECYSTECTOMY  03/29/2007   GALLBLADDER SURGERY     HIP SURGERY     bilateral    REPLACEMENT TOTAL KNEE Right    THUMB ARTHROSCOPY     Family History  Problem Relation Age of Onset   Heart attack Father    Stroke Father    Heart disease Father    CAD Father    Heart attack Son 71       MI   Hypertension Son    Hypertension Mother    Heart disease Mother    Stroke Mother    Breast cancer Neg Hx    Social History   Socioeconomic History   Marital status: Widowed    Spouse name: Not on file   Number of children: Not on file   Years of education: Not on file   Highest education level: Not on file  Occupational History   Not on file  Tobacco Use   Smoking status: Never   Smokeless tobacco: Never  Vaping Use   Vaping status: Never Used  Substance and Sexual Activity   Alcohol use: Yes    Comment: 2 oz QOD   Drug use: No   Sexual activity: Not Currently  Other Topics Concern   Not on file  Social History Narrative   Not on file   Social Drivers of Health   Financial Resource Strain: Low Risk  (10/04/2021)   Overall  Financial Resource Strain (CARDIA)    Difficulty of Paying Living Expenses: Not hard at all  Food Insecurity: No Food Insecurity (12/24/2023)   Hunger Vital Sign    Worried About Running Out of Food in the Last Year: Never true    Ran Out of Food in the Last Year: Never true  Transportation Needs: No Transportation Needs (12/24/2023)   PRAPARE - Administrator, Civil Service (Medical): No    Lack of Transportation (Non-Medical): No  Physical Activity: Insufficiently Active (10/04/2021)   Exercise Vital Sign    Days of Exercise per Week: 4 days    Minutes of Exercise per Session: 20 min  Stress: No Stress Concern Present (10/04/2021)   [employer] of Occupational Health - Occupational Stress Questionnaire    Feeling of Stress : Not at all  Social Connections: Socially Isolated (06/02/2023)   Social Connection and Isolation Panel     Frequency of Communication with Friends and Family: More than three times a week    Frequency of Social Gatherings with Friends and Family: Once a week    Attends Religious Services: Never    Database administrator or Organizations: No    Attends Engineer, structural: Not on file    Marital Status: Widowed     Review of Systems  Constitutional:  Negative for fever.       She has lost weight.   HENT:  Negative for congestion and sinus pressure.   Respiratory:  Negative for cough, chest tightness and shortness of breath.   Cardiovascular:  Negative for chest pain, palpitations and leg swelling.  Gastrointestinal:  Negative for abdominal pain, nausea and vomiting.  Genitourinary:  Negative for difficulty urinating and dysuria.  Musculoskeletal:  Negative for joint swelling and myalgias.  Skin:  Negative for color change and rash.  Neurological:  Negative for dizziness and headaches.  Psychiatric/Behavioral:         Increased stress and some depression. No SI.        Objective:     BP 134/72   Pulse 77   Ht 5' 2 (1.575 m)   Wt 118 lb 9.6 oz (53.8 kg)   SpO2 99%   BMI 21.69 kg/m  Wt Readings from Last 3 Encounters:  02/22/24 118 lb 9.6 oz (53.8 kg)  11/26/23 120 lb 4 oz (54.5 kg)  09/17/23 128 lb 9.6 oz (58.3 kg)    Physical Exam      Outpatient Encounter Medications as of 02/22/2024  Medication Sig   acetaminophen  (TYLENOL ) 500 MG tablet Take 1,000 mg by mouth every 8 (eight) hours as needed for mild pain or moderate pain.   ALPRAZolam  (XANAX ) 0.25 MG tablet TAKE 1 TABLET BY MOUTH DAILY AS NEEDED FOR ANXIETY   amLODipine  (NORVASC ) 2.5 MG tablet TAKE 1 TABLET BY MOUTH DAILY   aspirin  81 MG EC tablet Take 81 mg by mouth daily. Swallow whole.   atorvastatin  (LIPITOR) 10 MG tablet TAKE 1 TABLET BY MOUTH DAILY   cholecalciferol  (VITAMIN D ) 1000 UNITS tablet Take 1,000 Units by mouth every other day.    cyanocobalamin  (VITAMIN B12) 1000 MCG tablet Take 1,000 mcg by  mouth daily.   sertraline  (ZOLOFT ) 25 MG tablet Take 1 tablet (25 mg total) by mouth daily.   triamterene -hydrochlorothiazide  (MAXZIDE -25) 37.5-25 MG tablet TAKE 1 TABLET BY MOUTH DAILY   zolpidem  (AMBIEN ) 5 MG tablet Take 1 tablet (5 mg total) by mouth at bedtime as needed for sleep.   [  DISCONTINUED] cyanocobalamin  100 MCG tablet Take 100 mcg by mouth daily.   No facility-administered encounter medications on file as of 02/22/2024.     Lab Results  Component Value Date   WBC 11.5 (H) 02/22/2024   HGB 10.3 (L) 02/22/2024   HCT 31.4 (L) 02/22/2024   PLT 390.0 02/22/2024   GLUCOSE 88 02/22/2024   CHOL 119 11/26/2023   TRIG 118.0 11/26/2023   HDL 51.00 11/26/2023   LDLDIRECT 66.0 09/07/2018   LDLCALC 45 11/26/2023   ALT 12 11/26/2023   AST 17 11/26/2023   NA 139 02/22/2024   K 3.5 02/22/2024   CL 102 02/22/2024   CREATININE 0.97 02/22/2024   BUN 27 (H) 02/22/2024   CO2 28 02/22/2024   TSH 1.90 07/09/2023   INR 0.9 01/16/2009   HGBA1C 5.9 11/26/2023    No results found.     Assessment & Plan:  Essential hypertension Assessment & Plan: Continue triam/hctz and amlodipine .  Follow pressures.  No change today. Blood pressure recheck as outlined.    Hypokalemia -     Basic metabolic panel with GFR  Stage 3a chronic kidney disease (HCC) Assessment & Plan: Continue to avoid antiinflammatories.  Stay hydrated. Follow metabolic panel. Recheck today.   Orders: -     Basic metabolic panel with GFR  Leukocytosis, unspecified type -     CBC with Differential/Platelet  Anemia, unspecified type Assessment & Plan: Recheck cbc.    Anxiety Assessment & Plan: Recommend to continue zoloft . Discussed her current living situation. Discussed my concern regarding her eating and taking her medications. Also discussed recent falls. Discussed recommendation for assisted living. She declines. Wants to remain in her home. Discussed the need to get help in the home. Agreeable. Planning for  meeting with social work next week.    Aortic atherosclerosis (HCC) Assessment & Plan: Continue lipitor.    PAD (peripheral artery disease) (HCC) Assessment & Plan: Continue lipitor and aspirin .    Memory changes Assessment & Plan: Concern regarding memory change. Discussed this affecting her current living situation and need for help in the home. Discussed neurology evaluation. Agreeable.   Orders: -     Ambulatory referral to Neurology  Hyperglycemia Assessment & Plan: Follow met b and A1c. Discussed importance of eating regular meals.    Falls Assessment & Plan: Falls as outlined. No persistent pain or problems from the falls. Discussed PT.    Depression, recurrent (HCC) Assessment & Plan: Discussed with her today. Continue on zoloft . Social work involved. Needs further help in home. No SI. Follow.    I spent 45 minutes with the patient.  Time spent discussing her current concerns and symptoms. Specifically time spent discussing her current living situation, neurology evaluation and depression.  Time also spent discussing further w/up, evaluation and treatment.     Allena Hamilton, MD

## 2024-02-23 ENCOUNTER — Other Ambulatory Visit: Payer: Self-pay | Admitting: Internal Medicine

## 2024-02-23 ENCOUNTER — Ambulatory Visit: Payer: Self-pay | Admitting: Internal Medicine

## 2024-02-23 ENCOUNTER — Other Ambulatory Visit (INDEPENDENT_AMBULATORY_CARE_PROVIDER_SITE_OTHER)

## 2024-02-23 DIAGNOSIS — D649 Anemia, unspecified: Secondary | ICD-10-CM | POA: Diagnosis not present

## 2024-02-23 DIAGNOSIS — D509 Iron deficiency anemia, unspecified: Secondary | ICD-10-CM

## 2024-02-23 DIAGNOSIS — D72829 Elevated white blood cell count, unspecified: Secondary | ICD-10-CM

## 2024-02-23 LAB — VITAMIN B12: Vitamin B-12: 719 pg/mL (ref 211–911)

## 2024-02-23 LAB — IBC + FERRITIN
Ferritin: 13.6 ng/mL (ref 10.0–291.0)
Iron: 36 ug/dL — ABNORMAL LOW (ref 42–145)
Saturation Ratios: 9.9 % — ABNORMAL LOW (ref 20.0–50.0)
TIBC: 362.6 ug/dL (ref 250.0–450.0)
Transferrin: 259 mg/dL (ref 212.0–360.0)

## 2024-02-23 NOTE — Progress Notes (Signed)
Orders placed for add on labs.  

## 2024-02-25 ENCOUNTER — Ambulatory Visit: Admitting: Internal Medicine

## 2024-02-25 NOTE — Telephone Encounter (Signed)
 Order placed for GI referral.

## 2024-02-26 ENCOUNTER — Ambulatory Visit: Payer: Self-pay | Admitting: Internal Medicine

## 2024-02-26 DIAGNOSIS — H903 Sensorineural hearing loss, bilateral: Secondary | ICD-10-CM | POA: Diagnosis not present

## 2024-02-28 ENCOUNTER — Encounter: Payer: Self-pay | Admitting: Internal Medicine

## 2024-02-28 NOTE — Assessment & Plan Note (Signed)
 Follow met b and A1c. Discussed importance of eating regular meals.

## 2024-02-28 NOTE — Assessment & Plan Note (Signed)
 Recommend to continue zoloft . Discussed her current living situation. Discussed my concern regarding her eating and taking her medications. Also discussed recent falls. Discussed recommendation for assisted living. She declines. Wants to remain in her home. Discussed the need to get help in the home. Agreeable. Planning for meeting with social work next week.

## 2024-02-28 NOTE — Assessment & Plan Note (Signed)
 Concern regarding memory change. Discussed this affecting her current living situation and need for help in the home. Discussed neurology evaluation. Agreeable.

## 2024-02-28 NOTE — Assessment & Plan Note (Signed)
 Discussed with her today. Continue on zoloft . Social work involved. Needs further help in home. No SI. Follow.

## 2024-02-28 NOTE — Assessment & Plan Note (Signed)
 Continue triam/hctz and amlodipine .  Follow pressures.  No change today. Blood pressure recheck as outlined.

## 2024-02-28 NOTE — Assessment & Plan Note (Signed)
 Falls as outlined. No persistent pain or problems from the falls. Discussed PT.

## 2024-02-28 NOTE — Assessment & Plan Note (Signed)
Continue lipitor and aspirin.  ?

## 2024-02-28 NOTE — Assessment & Plan Note (Signed)
 Recheck cbc.

## 2024-02-28 NOTE — Assessment & Plan Note (Signed)
 Continue lipitor  ?

## 2024-02-28 NOTE — Assessment & Plan Note (Signed)
 Continue to avoid antiinflammatories.  Stay hydrated. Follow metabolic panel. Recheck today.

## 2024-02-29 ENCOUNTER — Other Ambulatory Visit: Admitting: *Deleted

## 2024-03-01 ENCOUNTER — Other Ambulatory Visit: Payer: Self-pay | Admitting: *Deleted

## 2024-03-08 ENCOUNTER — Other Ambulatory Visit: Payer: Self-pay | Admitting: *Deleted

## 2024-03-08 ENCOUNTER — Ambulatory Visit: Payer: Self-pay

## 2024-03-08 NOTE — Patient Outreach (Signed)
 Complex Care Management   Visit Note  03/08/2024  Name:  Alicia Mccann MRN: [id number] DOB: 04-Apr-1933  Situation: Referral received for Complex Care Management related to Mental/Behavioral Health diagnosis Anxiety and Depression I obtained verbal consent from Patient.  Visit completed with patient, daughter, RNCM, social worker in the office  Background:   Past Medical History:  Diagnosis Date   Allergic rhinitis    Anemia    Anxiety    Cellulitis    Cholelithiasis    Essential hypertension    History of stress test    a. 12/2008 Ex MV: EF 78%, no ischemia.   Insomnia    Osteoarthritis    Tachycardia    Valvular heart disease    a. 12/2008 Echo: EF 65%, mild MR;  b. 2/6 SEM RUSB - insignificant murmur, prob Ao Sclerosis.    Assessment: Patient Reported Symptoms:  Cognitive Cognitive Status: Alert and oriented to person, place, and time, Insightful and able to interpret abstract concepts, Normal speech and language skills Cognitive/Intellectual Conditions Management [RPT]: None reported or documented in medical history or problem list   Health Maintenance Behaviors: Annual physical exam, Stress management Healing Pattern: Average Health Facilitated by: Stress management  Neurological Neurological Review of Symptoms: No symptoms reported    HEENT HEENT Symptoms Reported: Other: (patient wers hearing aids) HEENT Management Strategies: Medical device HEENT Comment: continues to work on replacement hearing aids    Cardiovascular Cardiovascular Symptoms Reported: No symptoms reported    Respiratory Respiratory Symptoms Reported: No symptoms reported    Endocrine Endocrine Symptoms Reported: No symptoms reported    Gastrointestinal Gastrointestinal Symptoms Reported: No symptoms reported      Genitourinary Genitourinary Symptoms Reported: Frequency    Integumentary Integumentary Symptoms Reported: No symptoms reported    Musculoskeletal Musculoskelatal Symptoms  Reviewed: Difficulty walking, Limited mobility, Unsteady gait Additional Musculoskeletal Details: Pain in kneew, uses walker or cain to ambulate Musculoskeletal Management Strategies: Exercise, Medical device, Routine screening      Psychosocial Psychosocial Symptoms Reported: Alteration in eating habits Additional Psychological Details: GAD 7 completed previously, patient continues with anxiety symptoms related to memory, in home support  and fear of falling Behavioral Management Strategies: Adequate rest, Coping strategies, Counseling, Medication therapy Major Change/Loss/Stressor/Fears (CP): Relationship concerns, Resources Behaviors When Feeling Stressed/Fearful: withdraws, will contact a neighbor, takes care of dog Techniques to Cope with Loss/Stress/Change: Diversional activities, Medication Quality of Family Relationships: supportive Do you feel physically threatened by others?: No      12/24/2023   12:34 PM  Depression screen PHQ 2/9  Decreased Interest 2  Down, Depressed, Hopeless 3  PHQ - 2 Score 5  Altered sleeping 0  Tired, decreased energy 1  Change in appetite 1  Feeling bad or failure about yourself  0  Trouble concentrating 1  Suicidal thoughts 0  PHQ-9 Score 8  Difficult doing work/chores Very difficult    There were no vitals filed for this visit.  Medications Reviewed Today   Medications were not reviewed in this encounter     Recommendation:   PCP Follow-up Specialty provider follow-up as scheduled CSW to provide patient and daughter with Adult Day program offerings  Follow Up Plan:   Telephone follow-up 03/31/24  Lenn Mean, LCSW [place]  [facility], [facility] Licensed Clinical Social Worker  Direct Dial: [phone]

## 2024-03-08 NOTE — Patient Instructions (Signed)
 Visit Information  Thank you for taking time to visit with me today. Please don't hesitate to contact me if I can be of assistance to you before our next scheduled appointment.  Your next care management appointment is by telephone on 03/31/24 at 11:30am  Telephone follow-up 03/31/24 11:30am  Please call the care guide team at [phone] if you need to cancel, schedule, or reschedule an appointment.   Please call the Suicide and Crisis Lifeline: 988 call the [place]  National Suicide Prevention Lifeline: 224-735-9887 or TTY: 3201262086 TTY (563) 085-1061) to talk to a trained counselor call 1-800-273-TALK (toll free, 24 hour hotline) if you are experiencing a Mental Health or Behavioral Health Crisis or need someone to talk to.  Shaheem Pichon, LCSW [place]  [facility], [facility] Licensed Clinical Social Worker  Direct Dial: [phone]

## 2024-03-10 ENCOUNTER — Other Ambulatory Visit (INDEPENDENT_AMBULATORY_CARE_PROVIDER_SITE_OTHER)

## 2024-03-10 DIAGNOSIS — D649 Anemia, unspecified: Secondary | ICD-10-CM

## 2024-03-10 DIAGNOSIS — D72829 Elevated white blood cell count, unspecified: Secondary | ICD-10-CM | POA: Diagnosis not present

## 2024-03-10 LAB — CBC WITH DIFFERENTIAL/PLATELET
Basophils Absolute: 0.1 K/uL (ref 0.0–0.1)
Basophils Relative: 1.4 % (ref 0.0–3.0)
Eosinophils Absolute: 0.2 K/uL (ref 0.0–0.7)
Eosinophils Relative: 2.5 % (ref 0.0–5.0)
HCT: 32.1 % — ABNORMAL LOW (ref 36.0–46.0)
Hemoglobin: 10.5 g/dL — ABNORMAL LOW (ref 12.0–15.0)
Lymphocytes Relative: 16.5 % (ref 12.0–46.0)
Lymphs Abs: 1.4 K/uL (ref 0.7–4.0)
MCHC: 32.9 g/dL (ref 30.0–36.0)
MCV: 86.2 fl (ref 78.0–100.0)
Monocytes Absolute: 0.5 K/uL (ref 0.1–1.0)
Monocytes Relative: 6.4 % (ref 3.0–12.0)
Neutro Abs: 6.3 K/uL (ref 1.4–7.7)
Neutrophils Relative %: 73.2 % (ref 43.0–77.0)
Platelets: 420 K/uL — ABNORMAL HIGH (ref 150.0–400.0)
RBC: 3.72 Mil/uL — ABNORMAL LOW (ref 3.87–5.11)
RDW: 12.6 % (ref 11.5–15.5)
WBC: 8.6 K/uL (ref 4.0–10.5)

## 2024-03-10 LAB — IBC + FERRITIN
Ferritin: 11.6 ng/mL (ref 10.0–291.0)
Iron: 35 ug/dL — ABNORMAL LOW (ref 42–145)
Saturation Ratios: 9.9 % — ABNORMAL LOW (ref 20.0–50.0)
TIBC: 354.2 ug/dL (ref 250.0–450.0)
Transferrin: 253 mg/dL (ref 212.0–360.0)

## 2024-03-11 ENCOUNTER — Ambulatory Visit: Payer: Self-pay | Admitting: Internal Medicine

## 2024-03-11 DIAGNOSIS — D649 Anemia, unspecified: Secondary | ICD-10-CM

## 2024-03-11 DIAGNOSIS — D72829 Elevated white blood cell count, unspecified: Secondary | ICD-10-CM

## 2024-03-29 ENCOUNTER — Other Ambulatory Visit: Payer: Self-pay | Admitting: *Deleted

## 2024-03-29 NOTE — Patient Outreach (Signed)
 Complex Care Management   Visit Note  03/29/2024  Name:  Alicia Mccann MRN: [id number] DOB: [birth year]  Situation: Referral received for Complex Care Management related to Mental/Behavioral Health diagnosis Anxiety and Depression. Patient also needing additional in home care support. I obtained verbal consent from Patient.  Visit completed with patient, daughter by phone  Background:   Past Medical History:  Diagnosis Date   Allergic rhinitis    Anemia    Anxiety    Cellulitis    Cholelithiasis    Essential hypertension    History of stress test    a. 12/2008 Ex MV: EF 78%, no ischemia.   Insomnia    Osteoarthritis    Tachycardia    Valvular heart disease    a. 12/2008 Echo: EF 65%, mild MR;  b. 2/6 SEM RUSB - insignificant murmur, prob Ao Sclerosis.    Assessment: Patient Reported Symptoms:  Cognitive Cognitive Status: Able to follow simple commands, Poor judgment in daily scenarios, Alert and oriented to person, place, and time, Requires Assistance Decision Making Cognitive/Intellectual Conditions Management [RPT]: Other Other: memory changes   Health Maintenance Behaviors: Annual physical exam, Stress management Healing Pattern: Average Health Facilitated by: Stress management  Neurological   Neurological Comment: Appt scheduled with Neurology on 10/27  HEENT HEENT Symptoms Reported: Not assessed      Cardiovascular Cardiovascular Symptoms Reported: Not assessed    Respiratory Respiratory Symptoms Reported: Not assesed    Endocrine Endocrine Symptoms Reported: Not assessed    Gastrointestinal Gastrointestinal Symptoms Reported: Not assessed      Genitourinary Genitourinary Symptoms Reported: Not assessed    Integumentary Integumentary Symptoms Reported: Not assessed    Musculoskeletal Musculoskelatal Symptoms Reviewed: Difficulty walking, Limited mobility, Unsteady gait Additional Musculoskeletal Details: uses a cain to ambulate Musculoskeletal Management  Strategies: Medical device, Routine screening Falls in the past year?: Yes (per daughter fell again on Sunday) Number of falls in past year: 1 or less Was there an injury with Fall?: No Fall Risk Category Calculator: 1 Patient Fall Risk Level: Low Fall Risk    Psychosocial Psychosocial Symptoms Reported: Anxiety - if selected complete GAD, Alteration in eating habits, Alteration in sleep habits Additional Psychological Details: Per patient's daughter patient's cognition is beginning to decline, (lost her life alert, remains in bed throughout the day,  eating less, frequent falls, continues to adamantly refuse out of home placement or Adult Day program options-has a long term care policy that may cover home maker assistance with appropirate documentation Behavioral Management Strategies: Adequate rest          12/24/2023   12:34 PM  Depression screen PHQ 2/9  Decreased Interest 2  Down, Depressed, Hopeless 3  PHQ - 2 Score 5  Altered sleeping 0  Tired, decreased energy 1  Change in appetite 1  Feeling bad or failure about yourself  0  Trouble concentrating 1  Suicidal thoughts 0  PHQ-9 Score 8  Difficult doing work/chores Very difficult    There were no vitals filed for this visit.  Medications Reviewed Today     Reviewed by Ermalinda Lenn HERO, LCSW (Social Worker) on 03/29/24 at 1202  Med List Status: <None>   Medication Order Taking? Sig Documenting Provider Last Dose Status Informant  acetaminophen  (TYLENOL ) 500 MG tablet [id number]  Take 1,000 mg by mouth every 8 (eight) hours as needed for mild pain or moderate pain. [provider]  Active Other  ALPRAZolam  (XANAX ) 0.25 MG tablet [id number]  TAKE 1 TABLET BY MOUTH  DAILY AS NEEDED FOR ANXIETY Glendia Shad, MD  Active   amLODipine  (NORVASC ) 2.5 MG tablet [id number]  TAKE 1 TABLET BY MOUTH DAILY Gollan, Timothy J, MD  Active   aspirin  81 MG EC tablet [id number]  Take 81 mg by mouth daily. Swallow whole. [provider]  Active Other  atorvastatin  (LIPITOR) 10 MG tablet [id number]  TAKE 1 TABLET BY MOUTH DAILY Gollan, Timothy J, MD  Active   cholecalciferol  (VITAMIN D ) 1000 UNITS tablet [id number]  Take 1,000 Units by mouth every other day.  [provider]  Active Other  cyanocobalamin  (VITAMIN B12) 1000 MCG tablet [id number]  Take 1,000 mcg by mouth daily. [provider]  Active   sertraline  (ZOLOFT ) 25 MG tablet [id number]  Take 1 tablet (25 mg total) by mouth daily. Glendia Shad, MD  Active   triamterene -hydrochlorothiazide  (MAXZIDE -25) 37.5-25 MG tablet [id number]  TAKE 1 TABLET BY MOUTH DAILY Gollan, Timothy J, MD  Active   zolpidem  (AMBIEN ) 5 MG tablet [id number]  Take 1 tablet (5 mg total) by mouth at bedtime as needed for sleep. Kasa, Kurian, MD  Active             Recommendation:   PCP Follow-up Specialty provider follow-up Neurologist 06/13/24  Follow Up Plan:   Telephone follow-up 03/31/24  Lenn Mean, LCSW [place]  [facility], [facility] Licensed Clinical Social Worker  Direct Dial: [phone]

## 2024-03-29 NOTE — Patient Instructions (Signed)
 Visit Information  Thank you for taking time to visit with me today. Please don't hesitate to contact me if I can be of assistance to you before our next scheduled appointment.  Your next care management appointment is by telephone on 03/31/24 at 11:30am    Please call the care guide team at [phone] if you need to cancel, schedule, or reschedule an appointment.   Please call the Suicide and Crisis Lifeline: 988 call the [place]  National Suicide Prevention Lifeline: 586 306 4398 or TTY: (815) 676-9671 TTY 610-482-6880) to talk to a trained counselor call 1-800-273-TALK (toll free, 24 hour hotline) if you are experiencing a Mental Health or Behavioral Health Crisis or need someone to talk to.  Kewanda Poland, LCSW [place]  [facility], [facility] Licensed Clinical Social Worker  Direct Dial: [phone]

## 2024-03-31 ENCOUNTER — Telehealth: Admitting: *Deleted

## 2024-04-11 ENCOUNTER — Other Ambulatory Visit: Payer: Self-pay | Admitting: *Deleted

## 2024-04-12 ENCOUNTER — Other Ambulatory Visit (INDEPENDENT_AMBULATORY_CARE_PROVIDER_SITE_OTHER)

## 2024-04-12 DIAGNOSIS — D649 Anemia, unspecified: Secondary | ICD-10-CM

## 2024-04-12 DIAGNOSIS — D72829 Elevated white blood cell count, unspecified: Secondary | ICD-10-CM | POA: Diagnosis not present

## 2024-04-12 NOTE — Patient Outreach (Signed)
 Complex Care Management   Visit Note  04/12/2024  Name:  GIRL SCHISSLER MRN: [id number] DOB: May 12, 1933  Situation: Referral received for Complex Care Management related to Mental/Behavioral Health diagnosis Anxiety I obtained verbal consent from Patient.  Visit completed with Patient  on the phone on 04/11/24.  Background:   Past Medical History:  Diagnosis Date   Allergic rhinitis    Anemia    Anxiety    Cellulitis    Cholelithiasis    Essential hypertension    History of stress test    a. 12/2008 Ex MV: EF 78%, no ischemia.   Insomnia    Osteoarthritis    Tachycardia    Valvular heart disease    a. 12/2008 Echo: EF 65%, mild MR;  b. 2/6 SEM RUSB - insignificant murmur, prob Ao Sclerosis.    Assessment: Patient Reported Symptoms:  Cognitive Cognitive Status: Able to follow simple commands, Poor judgment in daily scenarios, Alert and oriented to person, place, and time Cognitive/Intellectual Conditions Management [RPT]: Other Other: memory changes   Health Maintenance Behaviors: Annual physical exam, Stress management Healing Pattern: Average Health Facilitated by: Stress management  Neurological Neurological Review of Symptoms: No symptoms reported Neurological Comment: Appt scheduled with Neurology on 10/27  HEENT HEENT Symptoms Reported: Change or loss of hearing (has hearing aids but needs repair) HEENT Management Strategies: Medical device    Cardiovascular Cardiovascular Symptoms Reported: No symptoms reported    Respiratory Respiratory Symptoms Reported: No symptoms reported    Endocrine Endocrine Symptoms Reported: No symptoms reported    Gastrointestinal Gastrointestinal Symptoms Reported: No symptoms reported      Genitourinary Genitourinary Symptoms Reported: Other Other Genitourinary Symptoms: Noticed that stools are dark-not sure why    Integumentary Integumentary Symptoms Reported: No symptoms reported    Musculoskeletal Musculoskelatal Symptoms  Reviewed: Difficulty walking, Limited mobility, Unsteady gait Additional Musculoskeletal Details: uses a cain to ambulate Musculoskeletal Management Strategies: Medical device, Routine screening      Psychosocial Psychosocial Symptoms Reported: Anxiety - if selected complete GAD Additional Psychological Details: increased anxiety related to finances and  minimal social support -patient has aid that continues to assists Tuesdays and Thursdays-daughter working with long term Financial controller for [employer] assistance-in home assessment completed-addiitonal in home care pending Behavioral Management Strategies: Adequate rest, Coping strategies Behavioral Health Comment: increased anxiety related to finances, minimal social support Major Change/Loss/Stressor/Fears (CP): Relationship concerns, Resources Behaviors When Feeling Stressed/Fearful: concentrates on all things postiive, takes care of pet, puzzles Techniques to [employer] with Loss/Stress/Change: Diversional activities Quality of Family Relationships: supportive Do you feel physically threatened by others?: No    04/12/2024    PHQ2-9 Depression Screening   Little interest or pleasure in doing things    Feeling down, depressed, or hopeless    PHQ-2 - Total Score    Trouble falling or staying asleep, or sleeping too much    Feeling tired or having little energy    Poor appetite or overeating     Feeling bad about yourself - or that you are a failure or have let yourself or your family down    Trouble concentrating on things, such as reading the newspaper or watching television    Moving or speaking so slowly that other people could have noticed.  Or the opposite - being so fidgety or restless that you have been moving around a lot more than usual    Thoughts that you would be better off dead, or hurting yourself in some way  PHQ2-9 Total Score    If you checked off any problems, how difficult have these problems made it for you to do your  work, take care of things at home, or get along with other people    Depression Interventions/Treatment      There were no vitals filed for this visit.  Medications Reviewed Today     Reviewed by Ermalinda Lenn HERO, LCSW (Social Worker) on 04/11/24 at 1622  Med List Status: <None>   Medication Order Taking? Sig Documenting Provider Last Dose Status Informant  acetaminophen  (TYLENOL ) 500 MG tablet [id number]  Take 1,000 mg by mouth every 8 (eight) hours as needed for mild pain or moderate pain. [provider]  Active Other  ALPRAZolam  (XANAX ) 0.25 MG tablet [id number]  TAKE 1 TABLET BY MOUTH DAILY AS NEEDED FOR ANXIETY Glendia Shad, MD  Active   amLODipine  (NORVASC ) 2.5 MG tablet [id number]  TAKE 1 TABLET BY MOUTH DAILY Gollan, Timothy J, MD  Active   aspirin  81 MG EC tablet [id number]  Take 81 mg by mouth daily. Swallow whole. [provider]  Active Other  atorvastatin  (LIPITOR) 10 MG tablet [id number]  TAKE 1 TABLET BY MOUTH DAILY Gollan, Timothy J, MD  Active   cholecalciferol  (VITAMIN D ) 1000 UNITS tablet [id number]  Take 1,000 Units by mouth every other day.  [provider]  Active Other  cyanocobalamin  (VITAMIN B12) 1000 MCG tablet [id number]  Take 1,000 mcg by mouth daily. [provider]  Active   sertraline  (ZOLOFT ) 25 MG tablet [id number]  Take 1 tablet (25 mg total) by mouth daily. Glendia Shad, MD  Active   triamterene -hydrochlorothiazide  (MAXZIDE -25) 37.5-25 MG tablet [id number]  TAKE 1 TABLET BY MOUTH DAILY Gollan, Timothy J, MD  Active   zolpidem  (AMBIEN ) 5 MG tablet [id number]  Take 1 tablet (5 mg total) by mouth at bedtime as needed for sleep. Kasa, Kurian, MD  Active             Recommendation:   PCP Follow-up  Follow Up Plan:   Telephone follow up appointment date/time:  04/26/24  Lenn Ermalinda, LCSW [place]  [facility], [facility] Licensed Clinical Social Worker  Direct Dial: [phone]

## 2024-04-12 NOTE — Patient Instructions (Signed)
 Visit Information  Thank you for taking time to visit with me today. Please don't hesitate to contact me if I can be of assistance to you before our next scheduled appointment.  Your next care management appointment is by telephone on 04/26/24 at 2pm    Please call the care guide team at [phone] if you need to cancel, schedule, or reschedule an appointment.   Please call the Suicide and Crisis Lifeline: 988 call the [place]  National Suicide Prevention Lifeline: (571) 075-6408 or TTY: (229)207-8578 TTY (917)545-9242) to talk to a trained counselor call 1-800-273-TALK (toll free, 24 hour hotline) if you are experiencing a Mental Health or Behavioral Health Crisis or need someone to talk to.  Kyira Volkert, LCSW [place]  [facility], [facility] Licensed Clinical Social Worker  Direct Dial: [phone]

## 2024-04-13 LAB — IBC + FERRITIN
Ferritin: 11.7 ng/mL (ref 10.0–291.0)
Iron: 145 ug/dL (ref 42–145)
Saturation Ratios: 38.8 % (ref 20.0–50.0)
TIBC: 373.8 ug/dL (ref 250.0–450.0)
Transferrin: 267 mg/dL (ref 212.0–360.0)

## 2024-04-13 LAB — CBC WITH DIFFERENTIAL/PLATELET
Basophils Absolute: 0.1 K/uL (ref 0.0–0.1)
Basophils Relative: 1.1 % (ref 0.0–3.0)
Eosinophils Absolute: 0.1 K/uL (ref 0.0–0.7)
Eosinophils Relative: 0.8 % (ref 0.0–5.0)
HCT: 36.1 % (ref 36.0–46.0)
Hemoglobin: 11.4 g/dL — ABNORMAL LOW (ref 12.0–15.0)
Lymphocytes Relative: 17 % (ref 12.0–46.0)
Lymphs Abs: 2.1 K/uL (ref 0.7–4.0)
MCHC: 31.6 g/dL (ref 30.0–36.0)
MCV: 86.3 fl (ref 78.0–100.0)
Monocytes Absolute: 0.7 K/uL (ref 0.1–1.0)
Monocytes Relative: 6 % (ref 3.0–12.0)
Neutro Abs: 9.3 K/uL — ABNORMAL HIGH (ref 1.4–7.7)
Neutrophils Relative %: 75.1 % (ref 43.0–77.0)
Platelets: 421 K/uL — ABNORMAL HIGH (ref 150.0–400.0)
RBC: 4.18 Mil/uL (ref 3.87–5.11)
RDW: 12.9 % (ref 11.5–15.5)
WBC: 12.3 K/uL — ABNORMAL HIGH (ref 4.0–10.5)

## 2024-04-14 ENCOUNTER — Ambulatory Visit: Payer: Self-pay | Admitting: Internal Medicine

## 2024-04-14 DIAGNOSIS — D72829 Elevated white blood cell count, unspecified: Secondary | ICD-10-CM

## 2024-04-14 DIAGNOSIS — D649 Anemia, unspecified: Secondary | ICD-10-CM

## 2024-04-16 ENCOUNTER — Other Ambulatory Visit: Payer: Self-pay | Admitting: Internal Medicine

## 2024-04-16 DIAGNOSIS — N1831 Chronic kidney disease, stage 3a: Secondary | ICD-10-CM

## 2024-04-16 DIAGNOSIS — E782 Mixed hyperlipidemia: Secondary | ICD-10-CM

## 2024-04-16 DIAGNOSIS — I10 Essential (primary) hypertension: Secondary | ICD-10-CM

## 2024-04-16 DIAGNOSIS — R739 Hyperglycemia, unspecified: Secondary | ICD-10-CM

## 2024-04-16 NOTE — Progress Notes (Signed)
 Order placed for future labs

## 2024-04-20 ENCOUNTER — Ambulatory Visit: Payer: Medicare PPO | Admitting: Dermatology

## 2024-04-26 ENCOUNTER — Telehealth: Payer: Self-pay | Admitting: *Deleted

## 2024-04-26 ENCOUNTER — Encounter: Payer: Self-pay | Admitting: *Deleted

## 2024-04-26 NOTE — Patient Instructions (Signed)
 Alicia Mccann - I am sorry I was unable to reach you today. I work with Glendia Shad, MD and am calling to support your healthcare needs. Please contact me at [phone] at your earliest convenience. I look forward to speaking with you soon.   Thank you,   Waino Mounsey, LCSW [place]  [facility], [facility] Licensed Clinical Social Worker  Direct Dial: [phone]

## 2024-05-12 ENCOUNTER — Other Ambulatory Visit

## 2024-05-13 ENCOUNTER — Telehealth: Payer: Self-pay | Admitting: *Deleted

## 2024-05-13 ENCOUNTER — Other Ambulatory Visit

## 2024-05-13 NOTE — Patient Outreach (Signed)
 Phone call from [employer] requesting medical records to assist with patient's appeal related to her long term care insurance plan. This Child psychotherapist confirmed having no access to these records and provided them with the contact number for medical records.[name] Lenn Mean, LCSW [facility]  [facility], [facility] Licensed Clinical Social Worker  Direct Dial: [phone]

## 2024-05-17 ENCOUNTER — Other Ambulatory Visit

## 2024-05-17 ENCOUNTER — Encounter: Payer: Self-pay | Admitting: Internal Medicine

## 2024-05-17 DIAGNOSIS — E782 Mixed hyperlipidemia: Secondary | ICD-10-CM

## 2024-05-17 DIAGNOSIS — N1831 Chronic kidney disease, stage 3a: Secondary | ICD-10-CM

## 2024-05-17 DIAGNOSIS — R739 Hyperglycemia, unspecified: Secondary | ICD-10-CM

## 2024-05-17 LAB — BASIC METABOLIC PANEL WITH GFR
BUN: 24 mg/dL — ABNORMAL HIGH (ref 6–23)
CO2: 28 meq/L (ref 19–32)
Calcium: 10.3 mg/dL (ref 8.4–10.5)
Chloride: 102 meq/L (ref 96–112)
Creatinine, Ser: 0.95 mg/dL (ref 0.40–1.20)
GFR: 53.32 mL/min — ABNORMAL LOW (ref >60.00)
Glucose, Bld: 95 mg/dL (ref 70–99)
Potassium: 3.5 meq/L (ref 3.5–5.1)
Sodium: 139 meq/L (ref 135–145)

## 2024-05-17 LAB — TSH: TSH: 1.45 u[IU]/mL (ref 0.35–5.50)

## 2024-05-17 LAB — LIPID PANEL
Cholesterol: 119 mg/dL (ref 0–200)
HDL: 51.7 mg/dL (ref >39.00)
LDL Cholesterol: 52 mg/dL (ref 0–99)
NonHDL: 67.77
Total CHOL/HDL Ratio: 2
Triglycerides: 80 mg/dL (ref 0.0–149.0)
VLDL: 16 mg/dL (ref 0.0–40.0)

## 2024-05-17 LAB — HEPATIC FUNCTION PANEL
ALT: 10 U/L (ref 0–35)
AST: 16 U/L (ref 0–37)
Albumin: 4.1 g/dL (ref 3.5–5.2)
Alkaline Phosphatase: 68 U/L (ref 39–117)
Bilirubin, Direct: 0.1 mg/dL (ref 0.0–0.3)
Total Bilirubin: 0.5 mg/dL (ref 0.2–1.2)
Total Protein: 6.4 g/dL (ref 6.0–8.3)

## 2024-05-17 LAB — HEMOGLOBIN A1C: Hgb A1c MFr Bld: 6.1 % (ref 4.6–6.5)

## 2024-05-18 ENCOUNTER — Ambulatory Visit: Payer: Self-pay | Admitting: Internal Medicine

## 2024-06-08 ENCOUNTER — Encounter: Payer: Self-pay | Admitting: Internal Medicine

## 2024-06-13 DIAGNOSIS — F32A Depression, unspecified: Secondary | ICD-10-CM | POA: Diagnosis not present

## 2024-06-13 DIAGNOSIS — R296 Repeated falls: Secondary | ICD-10-CM | POA: Diagnosis not present

## 2024-06-13 DIAGNOSIS — F03A11 Unspecified dementia, mild, with agitation: Secondary | ICD-10-CM | POA: Diagnosis not present

## 2024-06-15 NOTE — Telephone Encounter (Signed)
 Please call and notify Ms Nienow's daughter that if the company received the records from here, they would include Crystal's notes. They should be in with out notes - it is the same record.

## 2024-06-21 ENCOUNTER — Ambulatory Visit (INDEPENDENT_AMBULATORY_CARE_PROVIDER_SITE_OTHER): Admitting: Internal Medicine

## 2024-06-21 DIAGNOSIS — Z1231 Encounter for screening mammogram for malignant neoplasm of breast: Secondary | ICD-10-CM

## 2024-06-21 DIAGNOSIS — E782 Mixed hyperlipidemia: Secondary | ICD-10-CM

## 2024-06-21 DIAGNOSIS — N1831 Chronic kidney disease, stage 3a: Secondary | ICD-10-CM

## 2024-06-21 NOTE — Progress Notes (Deleted)
 Subjective:    Patient ID: Alicia Mccann, female    DOB: [birth year], 89 y.o.   MRN: [id number]  Patient here for No chief complaint on file.   HPI Here for a physical exam. Saw neurology 06/13/24 - mild late-onset dementia.  Continue zoloft . Continues on triam/hydrochlorothiazide  and amlodipine .    Past Medical History:  Diagnosis Date   Allergic rhinitis    Anemia    Anxiety    Cellulitis    Cholelithiasis    Essential hypertension    History of stress test    a. 12/2008 Ex MV: EF 78%, no ischemia.   Insomnia    Osteoarthritis    Tachycardia    Valvular heart disease    a. 12/2008 Echo: EF 65%, mild MR;  b. 2/6 SEM RUSB - insignificant murmur, prob Ao Sclerosis.   Past Surgical History:  Procedure Laterality Date   BACK SURGERY     BUNIONECTOMY     CATARACT EXTRACTION W/PHACO Right 05/18/2019   Procedure: CATARACT EXTRACTION PHACO AND INTRAOCULAR LENS PLACEMENT (IOC) RIGHT MALYUGIN  01:06.5  20.6%  13.72;  Surgeon: Mittie Gaskin, MD;  Location: [facility];  Service: Ophthalmology;  Laterality: Right;  please leave patient arrival 10   CATARACT EXTRACTION W/PHACO Left 06/08/2019   Procedure: CATARACT EXTRACTION PHACO AND INTRAOCULAR LENS PLACEMENT (IOC) LEFT  00:51.0  20.3%  10.37;  Surgeon: Mittie Gaskin, MD;  Location: [facility];  Service: Ophthalmology;  Laterality: Left;  ARRIVAL 10:30 PLEASE LEAVE   CHOLECYSTECTOMY  03/29/2007   GALLBLADDER SURGERY     HIP SURGERY     bilateral    REPLACEMENT TOTAL KNEE Right    THUMB ARTHROSCOPY     Family History  Problem Relation Age of Onset   Heart attack Father    Stroke Father    Heart disease Father    CAD Father    Heart attack Son 75       MI   Hypertension Son    Hypertension Mother    Heart disease Mother    Stroke Mother    Breast cancer Neg Hx    Social History   Socioeconomic History   Marital status: Widowed    Spouse name: Not on file   Number of children: Not on  file   Years of education: Not on file   Highest education level: Not on file  Occupational History   Not on file  Tobacco Use   Smoking status: Never   Smokeless tobacco: Never  Vaping Use   Vaping status: Never Used  Substance and Sexual Activity   Alcohol use: Yes    Comment: 2 oz QOD   Drug use: No   Sexual activity: Not Currently  Other Topics Concern   Not on file  Social History Narrative   Not on file   Social Drivers of Health   Financial Resource Strain: Low Risk  (06/13/2024)   Received from [facility]   Overall Financial Resource Strain (CARDIA)    Difficulty of Paying Living Expenses: Not very hard  Food Insecurity: No Food Insecurity (06/13/2024)   Received from [facility]   Hunger Vital Sign    Within the past 12 months, you worried that your food would run out before you got the money to buy more.: Never true    Within the past 12 months, the food you bought just didn't last and you didn't have money to get more.: Never true  Transportation Needs: No Transportation Needs (06/13/2024)   Received from [facility] - Transportation    In the past 12 months, has lack of transportation kept you from medical appointments or from getting medications?: No    Lack of Transportation (Non-Medical): No  Physical Activity: Insufficiently Active (10/04/2021)   Exercise Vital Sign    Days of Exercise per Week: 4 days    Minutes of Exercise per Session: 20 min  Stress: No Stress Concern Present (10/04/2021)   [employer] of Occupational Health - Occupational Stress Questionnaire    Feeling of Stress : Not at all  Social Connections: Socially Isolated (06/02/2023)   Social Connection and Isolation Panel    Frequency of Communication with Friends and Family: More than three times a week    Frequency of Social Gatherings with Friends and Family: Once a week    Attends Religious Services: Never    Automotive Engineer or Organizations: No    Attends Engineer, Structural: Not on file    Marital Status: Widowed     Review of Systems     Objective:     There were no vitals taken for this visit. Wt Readings from Last 3 Encounters:  02/22/24 118 lb 9.6 oz (53.8 kg)  11/26/23 120 lb 4 oz (54.5 kg)  09/17/23 128 lb 9.6 oz (58.3 kg)    Physical Exam  {Perform Simple Foot Exam  Perform Detailed exam:1} {Insert foot Exam (Optional):30965}   Outpatient Encounter Medications as of 06/21/2024  Medication Sig   acetaminophen  (TYLENOL ) 500 MG tablet Take 1,000 mg by mouth every 8 (eight) hours as needed for mild pain or moderate pain.   ALPRAZolam  (XANAX ) 0.25 MG tablet TAKE 1 TABLET BY MOUTH DAILY AS NEEDED FOR ANXIETY   amLODipine  (NORVASC ) 2.5 MG tablet TAKE 1 TABLET BY MOUTH DAILY   aspirin  81 MG EC tablet Take 81 mg by mouth daily. Swallow whole.   atorvastatin  (LIPITOR) 10 MG tablet TAKE 1 TABLET BY MOUTH DAILY   cholecalciferol  (VITAMIN D ) 1000 UNITS tablet Take 1,000 Units by mouth every other day.    cyanocobalamin  (VITAMIN B12) 1000 MCG tablet Take 1,000 mcg by mouth daily.   sertraline  (ZOLOFT ) 25 MG tablet Take 1 tablet (25 mg total) by mouth daily.   triamterene -hydrochlorothiazide  (MAXZIDE -25) 37.5-25 MG tablet TAKE 1 TABLET BY MOUTH DAILY   zolpidem  (AMBIEN ) 5 MG tablet Take 1 tablet (5 mg total) by mouth at bedtime as needed for sleep.   No facility-administered encounter medications on file as of 06/21/2024.     Lab Results  Component Value Date   WBC 12.3 (H) 04/12/2024   HGB 11.4 (L) 04/12/2024   HCT 36.1 04/12/2024   PLT 421.0 (H) 04/12/2024   GLUCOSE 95 05/17/2024   CHOL 119 05/17/2024   TRIG 80.0 05/17/2024   HDL 51.70 05/17/2024   LDLDIRECT 66.0 09/07/2018   LDLCALC 52 05/17/2024   ALT 10 05/17/2024   AST 16 05/17/2024   NA 139 05/17/2024   K 3.5 05/17/2024   CL 102 05/17/2024   CREATININE 0.95 05/17/2024   BUN 24 (H) 05/17/2024   CO2 28  05/17/2024   TSH 1.45 05/17/2024   INR 0.9 01/16/2009   HGBA1C 6.1 05/17/2024    No results found.     Assessment & Plan:  Encounter for screening mammogram for malignant neoplasm of breast  Stage 3a chronic kidney disease (HCC)  Mixed hyperlipidemia  Allena Hamilton, MD

## 2024-06-25 ENCOUNTER — Encounter: Payer: Self-pay | Admitting: Internal Medicine

## 2024-06-25 NOTE — Progress Notes (Signed)
 Did not show for appt.

## 2024-06-27 ENCOUNTER — Other Ambulatory Visit: Payer: Self-pay | Admitting: Cardiovascular Disease

## 2024-06-29 MED ORDER — TRIAMTERENE-HCTZ 37.5-25 MG PO TABS: 1.0000 | ORAL_TABLET | Freq: Every day | ORAL | 0 refills | Status: DC

## 2024-06-29 MED ORDER — ATORVASTATIN CALCIUM 10 MG PO TABS: 10.0000 mg | ORAL_TABLET | Freq: Every day | ORAL | 0 refills | Status: DC

## 2024-06-30 DIAGNOSIS — M25521 Pain in right elbow: Secondary | ICD-10-CM | POA: Diagnosis not present

## 2024-06-30 DIAGNOSIS — M7541 Impingement syndrome of right shoulder: Secondary | ICD-10-CM | POA: Diagnosis not present

## 2024-06-30 DIAGNOSIS — M25511 Pain in right shoulder: Secondary | ICD-10-CM | POA: Diagnosis not present

## 2024-07-07 ENCOUNTER — Encounter: Payer: Self-pay | Admitting: *Deleted

## 2024-07-07 ENCOUNTER — Ambulatory Visit (INDEPENDENT_AMBULATORY_CARE_PROVIDER_SITE_OTHER): Admitting: Internal Medicine

## 2024-07-07 ENCOUNTER — Encounter: Payer: Self-pay | Admitting: Internal Medicine

## 2024-07-07 DIAGNOSIS — D72829 Elevated white blood cell count, unspecified: Secondary | ICD-10-CM

## 2024-07-07 DIAGNOSIS — R739 Hyperglycemia, unspecified: Secondary | ICD-10-CM

## 2024-07-07 DIAGNOSIS — N1831 Chronic kidney disease, stage 3a: Secondary | ICD-10-CM

## 2024-07-07 DIAGNOSIS — I10 Essential (primary) hypertension: Secondary | ICD-10-CM

## 2024-07-07 DIAGNOSIS — Z0000 Encounter for general adult medical examination without abnormal findings: Secondary | ICD-10-CM

## 2024-07-07 DIAGNOSIS — E782 Mixed hyperlipidemia: Secondary | ICD-10-CM

## 2024-07-07 NOTE — Progress Notes (Deleted)
 Subjective:    Patient ID: Alicia Mccann, female    DOB: [birth year], 89 y.o.   MRN: [id number]  Patient here for No chief complaint on file.   HPI Here for a physical exam. Recently evaluated by Dr Avanell - 06/30/24 - right shoulder> right elbow pain. Recent fall. Recommended continue f/u with [facility]. Continue tramadol prn. S/p subacromial injection. Recommended f/u in 4 weeks. Saw neurology 06/13/24 - mild late onset dementia. Advised against driving. Continue zoloft . Continues on triam/hydrochlorothiazide  and amlodipine  for her blood pressure.    Past Medical History:  Diagnosis Date   Allergic rhinitis    Anemia    Anxiety    Cellulitis    Cholelithiasis    Essential hypertension    History of stress test    a. 12/2008 Ex MV: EF 78%, no ischemia.   Insomnia    Osteoarthritis    Tachycardia    Valvular heart disease    a. 12/2008 Echo: EF 65%, mild MR;  b. 2/6 SEM RUSB - insignificant murmur, prob Ao Sclerosis.   Past Surgical History:  Procedure Laterality Date   BACK SURGERY     BUNIONECTOMY     CATARACT EXTRACTION W/PHACO Right 05/18/2019   Procedure: CATARACT EXTRACTION PHACO AND INTRAOCULAR LENS PLACEMENT (IOC) RIGHT MALYUGIN  01:06.5  20.6%  13.72;  Surgeon: Mittie Gaskin, MD;  Location: [facility];  Service: Ophthalmology;  Laterality: Right;  please leave patient arrival 10   CATARACT EXTRACTION W/PHACO Left 06/08/2019   Procedure: CATARACT EXTRACTION PHACO AND INTRAOCULAR LENS PLACEMENT (IOC) LEFT  00:51.0  20.3%  10.37;  Surgeon: Mittie Gaskin, MD;  Location: [facility];  Service: Ophthalmology;  Laterality: Left;  ARRIVAL 10:30 PLEASE LEAVE   CHOLECYSTECTOMY  03/29/2007   GALLBLADDER SURGERY     HIP SURGERY     bilateral    REPLACEMENT TOTAL KNEE Right    THUMB ARTHROSCOPY     Family History  Problem Relation Age of Onset   Heart attack Father    Stroke Father    Heart disease Father    CAD Father    Heart attack Son 51        MI   Hypertension Son    Hypertension Mother    Heart disease Mother    Stroke Mother    Breast cancer Neg Hx    Social History   Socioeconomic History   Marital status: Widowed    Spouse name: Not on file   Number of children: Not on file   Years of education: Not on file   Highest education level: Not on file  Occupational History   Not on file  Tobacco Use   Smoking status: Never   Smokeless tobacco: Never  Vaping Use   Vaping status: Never Used  Substance and Sexual Activity   Alcohol use: Yes    Comment: 2 oz QOD   Drug use: No   Sexual activity: Not Currently  Other Topics Concern   Not on file  Social History Narrative   Not on file   Social Drivers of Health   Financial Resource Strain: Low Risk  (06/13/2024)   Received from [facility]   Overall Financial Resource Strain (CARDIA)    Difficulty of Paying Living Expenses: Not very hard  Food Insecurity: No Food Insecurity (06/13/2024)   Received from [facility]   Hunger Vital Sign    Within the past 12 months, you worried that your food would  run out before you got the money to buy more.: Never true    Within the past 12 months, the food you bought just didn't last and you didn't have money to get more.: Never true  Transportation Needs: No Transportation Needs (06/13/2024)   Received from [facility] - Transportation    In the past 12 months, has lack of transportation kept you from medical appointments or from getting medications?: No    Lack of Transportation (Non-Medical): No  Physical Activity: Insufficiently Active (10/04/2021)   Exercise Vital Sign    Days of Exercise per Week: 4 days    Minutes of Exercise per Session: 20 min  Stress: No Stress Concern Present (10/04/2021)   [employer] of Occupational Health - Occupational Stress Questionnaire    Feeling of Stress : Not at all  Social Connections: Socially Isolated  (06/02/2023)   Social Connection and Isolation Panel    Frequency of Communication with Friends and Family: More than three times a week    Frequency of Social Gatherings with Friends and Family: Once a week    Attends Religious Services: Never    Database Administrator or Organizations: No    Attends Engineer, Structural: Not on file    Marital Status: Widowed     Review of Systems     Objective:     There were no vitals taken for this visit. Wt Readings from Last 3 Encounters:  02/22/24 118 lb 9.6 oz (53.8 kg)  11/26/23 120 lb 4 oz (54.5 kg)  09/17/23 128 lb 9.6 oz (58.3 kg)    Physical Exam  {Perform Simple Foot Exam  Perform Detailed exam:1} {Insert foot Exam (Optional):30965}   Outpatient Encounter Medications as of 07/07/2024  Medication Sig   predniSONE  (DELTASONE ) 10 MG tablet 6 day taper - Take as directed   acetaminophen  (TYLENOL ) 500 MG tablet Take 1,000 mg by mouth every 8 (eight) hours as needed for mild pain or moderate pain.   ALPRAZolam  (XANAX ) 0.25 MG tablet TAKE 1 TABLET BY MOUTH DAILY AS NEEDED FOR ANXIETY   amLODipine  (NORVASC ) 2.5 MG tablet TAKE 1 TABLET BY MOUTH DAILY   aspirin  81 MG EC tablet Take 81 mg by mouth daily. Swallow whole.   atorvastatin  (LIPITOR) 10 MG tablet Take 1 tablet (10 mg total) by mouth daily.   cholecalciferol  (VITAMIN D ) 1000 UNITS tablet Take 1,000 Units by mouth every other day.    cyanocobalamin  (VITAMIN B12) 1000 MCG tablet Take 1,000 mcg by mouth daily.   sertraline  (ZOLOFT ) 25 MG tablet Take 1 tablet (25 mg total) by mouth daily.   triamterene -hydrochlorothiazide  (MAXZIDE -25) 37.5-25 MG tablet Take 1 tablet by mouth daily.   zolpidem  (AMBIEN ) 5 MG tablet Take 1 tablet (5 mg total) by mouth at bedtime as needed for sleep.   No facility-administered encounter medications on file as of 07/07/2024.     Lab Results  Component Value Date   WBC 12.3 (H) 04/12/2024   HGB 11.4 (L) 04/12/2024   HCT 36.1 04/12/2024    PLT 421.0 (H) 04/12/2024   GLUCOSE 95 05/17/2024   CHOL 119 05/17/2024   TRIG 80.0 05/17/2024   HDL 51.70 05/17/2024   LDLDIRECT 66.0 09/07/2018   LDLCALC 52 05/17/2024   ALT 10 05/17/2024   AST 16 05/17/2024   NA 139 05/17/2024   K 3.5 05/17/2024   CL 102 05/17/2024   CREATININE 0.95 05/17/2024   BUN 24 (H) 05/17/2024  CO2 28 05/17/2024   TSH 1.45 05/17/2024   INR 0.9 01/16/2009   HGBA1C 6.1 05/17/2024    No results found.     Assessment & Plan:  Health care maintenance  Stage 3a chronic kidney disease (HCC)  Hyperglycemia  Mixed hyperlipidemia  Leukocytosis, unspecified type     Allena Hamilton, MD

## 2024-07-07 NOTE — Assessment & Plan Note (Signed)
 Physical today 07/07/24.  Mammogram 11/29/21 - Birads II.

## 2024-07-07 NOTE — Progress Notes (Signed)
 Patient ID: Alicia Mccann, female   DOB: 11/04/1932, 89 y.o.   MRN: [id number] Did not show for appt.

## 2024-07-08 ENCOUNTER — Encounter: Admitting: Internal Medicine

## 2024-07-11 ENCOUNTER — Ambulatory Visit (INDEPENDENT_AMBULATORY_CARE_PROVIDER_SITE_OTHER): Admitting: Internal Medicine

## 2024-07-11 VITALS — BP 132/72 | HR 88 | Temp 97.9°F | Resp 18 | Ht 62.0 in | Wt 110.8 lb

## 2024-07-11 DIAGNOSIS — R413 Other amnesia: Secondary | ICD-10-CM

## 2024-07-11 DIAGNOSIS — M25512 Pain in left shoulder: Secondary | ICD-10-CM

## 2024-07-11 DIAGNOSIS — Z23 Encounter for immunization: Secondary | ICD-10-CM

## 2024-07-11 DIAGNOSIS — I739 Peripheral vascular disease, unspecified: Secondary | ICD-10-CM | POA: Diagnosis not present

## 2024-07-11 DIAGNOSIS — Z0000 Encounter for general adult medical examination without abnormal findings: Secondary | ICD-10-CM | POA: Diagnosis not present

## 2024-07-11 DIAGNOSIS — F339 Major depressive disorder, recurrent, unspecified: Secondary | ICD-10-CM | POA: Diagnosis not present

## 2024-07-11 DIAGNOSIS — R739 Hyperglycemia, unspecified: Secondary | ICD-10-CM

## 2024-07-11 DIAGNOSIS — M25511 Pain in right shoulder: Secondary | ICD-10-CM | POA: Diagnosis not present

## 2024-07-11 DIAGNOSIS — I10 Essential (primary) hypertension: Secondary | ICD-10-CM | POA: Diagnosis not present

## 2024-07-11 DIAGNOSIS — N1831 Chronic kidney disease, stage 3a: Secondary | ICD-10-CM | POA: Diagnosis not present

## 2024-07-11 NOTE — Progress Notes (Signed)
 Subjective:    Patient ID: Alicia Mccann, female    DOB: 1933/04/10, 89 y.o.   MRN: [id number]  Patient here for  Chief Complaint  Patient presents with   Annual Exam    HPI Here for a physical exam. Recently evaluated by Dr Avanell - 06/30/24 - right shoulder> right elbow pain. Recent fall. Recommended continue f/u with NSU. Continue tramadol prn. S/p subacromial injection. Recommended f/u in 4 weeks. Saw neurology 06/13/24 - mild late onset dementia. Advised against driving. Continue zoloft . Continues on triam/hydrochlorothiazide  and amlodipine  for her blood pressure. She is accompanied by her caretaker. History obtained from both of them. Breathing stable. No increased cough or congestion. No abdominal pain or bowel change reported.    Past Medical History:  Diagnosis Date   Allergic rhinitis    Anemia    Anxiety    Cellulitis    Cholelithiasis    Essential hypertension    History of stress test    a. 12/2008 Ex MV: EF 78%, no ischemia.   Insomnia    Osteoarthritis    Tachycardia    Valvular heart disease    a. 12/2008 Echo: EF 65%, mild MR;  b. 2/6 SEM RUSB - insignificant murmur, prob Ao Sclerosis.   Past Surgical History:  Procedure Laterality Date   BACK SURGERY     BUNIONECTOMY     CATARACT EXTRACTION W/PHACO Right 05/18/2019   Procedure: CATARACT EXTRACTION PHACO AND INTRAOCULAR LENS PLACEMENT (IOC) RIGHT MALYUGIN  01:06.5  20.6%  13.72;  Surgeon: Mittie Gaskin, MD;  Location: [facility];  Service: Ophthalmology;  Laterality: Right;  please leave patient arrival 10   CATARACT EXTRACTION W/PHACO Left 06/08/2019   Procedure: CATARACT EXTRACTION PHACO AND INTRAOCULAR LENS PLACEMENT (IOC) LEFT  00:51.0  20.3%  10.37;  Surgeon: Mittie Gaskin, MD;  Location: [facility];  Service: Ophthalmology;  Laterality: Left;  ARRIVAL 10:30 PLEASE LEAVE   CHOLECYSTECTOMY  03/29/2007   GALLBLADDER SURGERY     HIP SURGERY     bilateral    REPLACEMENT  TOTAL KNEE Right    THUMB ARTHROSCOPY     Family History  Problem Relation Age of Onset   Heart attack Father    Stroke Father    Heart disease Father    CAD Father    Heart attack Son 21       MI   Hypertension Son    Hypertension Mother    Heart disease Mother    Stroke Mother    Breast cancer Neg Hx    Social History   Socioeconomic History   Marital status: Widowed    Spouse name: Not on file   Number of children: Not on file   Years of education: Not on file   Highest education level: Not on file  Occupational History   Not on file  Tobacco Use   Smoking status: Never   Smokeless tobacco: Never  Vaping Use   Vaping status: Never Used  Substance and Sexual Activity   Alcohol use: Yes    Comment: 2 oz QOD   Drug use: No   Sexual activity: Not Currently  Other Topics Concern   Not on file  Social History Narrative   Not on file   Social Drivers of Health   Financial Resource Strain: Low Risk  (06/13/2024)   Received from [facility]   Overall Financial Resource Strain (CARDIA)    Difficulty of Paying Living Expenses: Not very hard  Food Insecurity: No Food Insecurity (06/13/2024)   Received from [facility]   Hunger Vital Sign    Within the past 12 months, you worried that your food would run out before you got the money to buy more.: Never true    Within the past 12 months, the food you bought just didn't last and you didn't have money to get more.: Never true  Transportation Needs: No Transportation Needs (06/13/2024)   Received from [facility] - Transportation    In the past 12 months, has lack of transportation kept you from medical appointments or from getting medications?: No    Lack of Transportation (Non-Medical): No  Physical Activity: Insufficiently Active (10/04/2021)   Exercise Vital Sign    Days of Exercise per Week: 4 days    Minutes of Exercise per Session: 20 min  Stress: No  Stress Concern Present (10/04/2021)   [employer] - Occupational Stress Questionnaire    Feeling of Stress : Not at all  Social Connections: Socially Isolated (06/02/2023)   Social Connection and Isolation Panel    Frequency of Communication with Friends and Family: More than three times a week    Frequency of Social Gatherings with Friends and Family: Once a week    Attends Religious Services: Never    Database Administrator or Organizations: No    Attends Engineer, Structural: Not on file    Marital Status: Widowed     Review of Systems  Constitutional:  Negative for appetite change and unexpected weight change.  HENT:  Negative for congestion, sinus pressure and sore throat.   Eyes:  Negative for pain and visual disturbance.  Respiratory:  Negative for cough, chest tightness and shortness of breath.   Cardiovascular:  Negative for chest pain, palpitations and leg swelling.  Gastrointestinal:  Negative for abdominal pain, diarrhea, nausea and vomiting.  Genitourinary:  Negative for difficulty urinating and dysuria.  Musculoskeletal:  Negative for joint swelling and myalgias.  Skin:  Negative for color change and rash.  Neurological:  Negative for dizziness and headaches.  Hematological:  Negative for adenopathy. Does not bruise/bleed easily.  Psychiatric/Behavioral:  Negative for agitation and dysphoric mood.        Objective:     BP 132/72   Pulse 88   Temp 97.9 F (36.6 C)   Resp 18   Ht 5' 2 (1.575 m)   Wt 110 lb 12.8 oz (50.3 kg)   SpO2 98%   BMI 20.27 kg/m  Wt Readings from Last 3 Encounters:  07/11/24 110 lb 12.8 oz (50.3 kg)  02/22/24 118 lb 9.6 oz (53.8 kg)  11/26/23 120 lb 4 oz (54.5 kg)    Physical Exam Vitals reviewed.  Constitutional:      General: She is not in acute distress.    Appearance: Normal appearance.  HENT:     Head: Normocephalic and atraumatic.     Right Ear: External ear normal.     Left Ear:  External ear normal.     Mouth/Throat:     Pharynx: No oropharyngeal exudate or posterior oropharyngeal erythema.  Eyes:     General: No scleral icterus.       Right eye: No discharge.        Left eye: No discharge.     Conjunctiva/sclera: Conjunctivae normal.  Neck:     Thyroid : No thyromegaly.  Cardiovascular:     Rate and Rhythm: Normal  rate and regular rhythm.  Pulmonary:     Effort: No respiratory distress.     Breath sounds: Normal breath sounds. No wheezing.  Abdominal:     General: Bowel sounds are normal.     Palpations: Abdomen is soft.     Tenderness: There is no abdominal tenderness.  Musculoskeletal:        General: No swelling or tenderness.     Cervical back: Neck supple. No tenderness.  Lymphadenopathy:     Cervical: No cervical adenopathy.  Skin:    Findings: No erythema or rash.  Neurological:     Mental Status: She is alert.  Psychiatric:        Mood and Affect: Mood normal.        Behavior: Behavior normal.         Outpatient Encounter Medications as of 07/11/2024  Medication Sig   acetaminophen  (TYLENOL ) 500 MG tablet Take 1,000 mg by mouth every 8 (eight) hours as needed for mild pain or moderate pain.   ALPRAZolam  (XANAX ) 0.25 MG tablet TAKE 1 TABLET BY MOUTH DAILY AS NEEDED FOR ANXIETY   amLODipine  (NORVASC ) 2.5 MG tablet TAKE 1 TABLET BY MOUTH DAILY   aspirin  81 MG EC tablet Take 81 mg by mouth daily. Swallow whole.   atorvastatin  (LIPITOR) 10 MG tablet Take 1 tablet (10 mg total) by mouth daily.   cholecalciferol  (VITAMIN D ) 1000 UNITS tablet Take 1,000 Units by mouth every other day.    cyanocobalamin  (VITAMIN B12) 1000 MCG tablet Take 1,000 mcg by mouth daily.   hydrochlorothiazide  (HYDRODIURIL ) 25 MG tablet Take 25 mg by mouth.   predniSONE  (DELTASONE ) 10 MG tablet 6 day taper - Take as directed   sertraline  (ZOLOFT ) 25 MG tablet Take 1 tablet (25 mg total) by mouth daily.   traMADol (ULTRAM) 50 MG tablet Take 50 mg by mouth.    triamterene -hydrochlorothiazide  (MAXZIDE -25) 37.5-25 MG tablet Take 1 tablet by mouth daily.   zolpidem  (AMBIEN ) 5 MG tablet Take 1 tablet (5 mg total) by mouth at bedtime as needed for sleep.   No facility-administered encounter medications on file as of 07/11/2024.     Lab Results  Component Value Date   WBC 12.3 (H) 04/12/2024   HGB 11.4 (L) 04/12/2024   HCT 36.1 04/12/2024   PLT 421.0 (H) 04/12/2024   GLUCOSE 95 05/17/2024   CHOL 119 05/17/2024   TRIG 80.0 05/17/2024   HDL 51.70 05/17/2024   LDLDIRECT 66.0 09/07/2018   LDLCALC 52 05/17/2024   ALT 10 05/17/2024   AST 16 05/17/2024   NA 139 05/17/2024   K 3.5 05/17/2024   CL 102 05/17/2024   CREATININE 0.95 05/17/2024   BUN 24 (H) 05/17/2024   CO2 28 05/17/2024   TSH 1.45 05/17/2024   INR 0.9 01/16/2009   HGBA1C 6.1 05/17/2024       Assessment & Plan:  Need for influenza vaccination -     Flu vaccine HIGH DOSE PF(Fluzone Trivalent)  PAD (peripheral artery disease) Assessment & Plan: Continue lipitor and aspirin .    Memory changes Assessment & Plan:  Saw neurology 06/13/24 - mild late onset dementia. Advised against driving. Continue zoloft . Continues on triam/hydrochlorothiazide  and amlodipine  for her blood pressure.    Hyperglycemia Assessment & Plan: Follow met b and A1c.    Essential hypertension Assessment & Plan: Continue triam/hctz and amlodipine .  Follow pressures.  No change today. Blood pressure as outlined.    Depression, recurrent Assessment & Plan: Continue zoloft . Caretaker present.  Appears to be doing better. Follow.    Stage 3a chronic kidney disease (HCC) Assessment & Plan: Continue to avoid antiinflammatories.  Stay hydrated. Follow metabolic panel.    Bilateral shoulder pain, unspecified chronicity Assessment & Plan: Was just evaluated by [facility] 01/27/23 - pain in left lateral neck with radiation to the left trapezius ridge to the shoulder.  MRI cervical spine without contrast  from [place] dated 10/01/2018 for imaging and report reviewed today. C1-2 noted facet collapse and spurring with sclerosis and right C2 impingement likely. C2-3 facet degenerative changes with disc bulging no stenosis. C3-4 disc desiccation with mild bilateral foraminal stenosis. No central stenosis. C4-5 disc collapse with severe central stenosis and noted bilateral foraminal stenosis. C5-6 disc collapse and endplate ridging with severe central stenosis and cord flattening with T2 hyperintensity likely reflecting myelomalacia. C6-7 to space narrowing without stenosis. C7-T1 degenerative facet arthritis without stenosis. Recommended continuing tramadol prn. Rarely takes.  Recommended NSU f/u.  Saw Dr Clois 03/31/23 - s/p ESI C4-5.  Neck doing better.  Recently evaluated by Dr Avanell - 06/30/24 - right shoulder> right elbow pain. Recent fall. Recommended continue f/u with NSU. Continue tramadol prn. S/p subacromial injection. Recommended f/u in 4 weeks   Healthcare maintenance Assessment & Plan: Physical today 07/11/24.  Mammogram 11/29/21 - Birads II.       Allena Hamilton, MD

## 2024-07-16 ENCOUNTER — Encounter: Payer: Self-pay | Admitting: Internal Medicine

## 2024-07-16 NOTE — Assessment & Plan Note (Signed)
 Saw neurology 06/13/24 - mild late onset dementia. Advised against driving. Continue zoloft . Continues on triam/hydrochlorothiazide  and amlodipine  for her blood pressure.

## 2024-07-16 NOTE — Assessment & Plan Note (Signed)
 Was just evaluated by [facility] 01/27/23 - pain in left lateral neck with radiation to the left trapezius ridge to the shoulder.  MRI cervical spine without contrast from [place] dated 10/01/2018 for imaging and report reviewed today. C1-2 noted facet collapse and spurring with sclerosis and right C2 impingement likely. C2-3 facet degenerative changes with disc bulging no stenosis. C3-4 disc desiccation with mild bilateral foraminal stenosis. No central stenosis. C4-5 disc collapse with severe central stenosis and noted bilateral foraminal stenosis. C5-6 disc collapse and endplate ridging with severe central stenosis and cord flattening with T2 hyperintensity likely reflecting myelomalacia. C6-7 to space narrowing without stenosis. C7-T1 degenerative facet arthritis without stenosis. Recommended continuing tramadol prn. Rarely takes.  Recommended NSU f/u.  Saw Dr Clois 03/31/23 - s/p ESI C4-5.  Neck doing better.  Recently evaluated by Dr Avanell - 06/30/24 - right shoulder> right elbow pain. Recent fall. Recommended continue f/u with NSU. Continue tramadol prn. S/p subacromial injection. Recommended f/u in 4 weeks

## 2024-07-16 NOTE — Assessment & Plan Note (Signed)
 Continue zoloft . Caretaker present. Appears to be doing better. Follow.

## 2024-07-16 NOTE — Assessment & Plan Note (Signed)
 Physical today 07/11/24.  Mammogram 11/29/21 - Birads II.

## 2024-07-16 NOTE — Assessment & Plan Note (Signed)
 Continue triam/hctz and amlodipine .  Follow pressures.  No change today. Blood pressure as outlined.

## 2024-07-16 NOTE — Assessment & Plan Note (Signed)
Continue lipitor and aspirin.  ?

## 2024-07-16 NOTE — Assessment & Plan Note (Signed)
 Continue to avoid antiinflammatories.  Stay hydrated.  Follow metabolic panel.

## 2024-07-16 NOTE — Assessment & Plan Note (Signed)
 Follow met b and A1c.

## 2024-07-17 ENCOUNTER — Emergency Department

## 2024-07-17 ENCOUNTER — Other Ambulatory Visit: Payer: Self-pay

## 2024-07-17 ENCOUNTER — Observation Stay
Admission: EM | Admit: 2024-07-17 | Discharge: 2024-07-19 | Disposition: A | Attending: Emergency Medicine | Admitting: Emergency Medicine

## 2024-07-17 DIAGNOSIS — E876 Hypokalemia: Secondary | ICD-10-CM | POA: Diagnosis not present

## 2024-07-17 DIAGNOSIS — S0990XA Unspecified injury of head, initial encounter: Secondary | ICD-10-CM | POA: Diagnosis not present

## 2024-07-17 DIAGNOSIS — F418 Other specified anxiety disorders: Secondary | ICD-10-CM | POA: Diagnosis present

## 2024-07-17 DIAGNOSIS — M79601 Pain in right arm: Secondary | ICD-10-CM | POA: Diagnosis not present

## 2024-07-17 DIAGNOSIS — I129 Hypertensive chronic kidney disease with stage 1 through stage 4 chronic kidney disease, or unspecified chronic kidney disease: Secondary | ICD-10-CM | POA: Insufficient documentation

## 2024-07-17 DIAGNOSIS — W19XXXA Unspecified fall, initial encounter: Principal | ICD-10-CM

## 2024-07-17 DIAGNOSIS — I5A Non-ischemic myocardial injury (non-traumatic): Secondary | ICD-10-CM | POA: Diagnosis present

## 2024-07-17 DIAGNOSIS — W1830XA Fall on same level, unspecified, initial encounter: Secondary | ICD-10-CM | POA: Insufficient documentation

## 2024-07-17 DIAGNOSIS — F1090 Alcohol use, unspecified, uncomplicated: Secondary | ICD-10-CM | POA: Insufficient documentation

## 2024-07-17 DIAGNOSIS — D72829 Elevated white blood cell count, unspecified: Secondary | ICD-10-CM | POA: Diagnosis present

## 2024-07-17 DIAGNOSIS — Z7901 Long term (current) use of anticoagulants: Secondary | ICD-10-CM | POA: Insufficient documentation

## 2024-07-17 DIAGNOSIS — N1831 Chronic kidney disease, stage 3a: Secondary | ICD-10-CM | POA: Diagnosis present

## 2024-07-17 DIAGNOSIS — R7989 Other specified abnormal findings of blood chemistry: Secondary | ICD-10-CM | POA: Diagnosis not present

## 2024-07-17 DIAGNOSIS — M25511 Pain in right shoulder: Secondary | ICD-10-CM | POA: Diagnosis present

## 2024-07-17 DIAGNOSIS — I10 Essential (primary) hypertension: Secondary | ICD-10-CM | POA: Diagnosis not present

## 2024-07-17 DIAGNOSIS — M6282 Rhabdomyolysis: Principal | ICD-10-CM | POA: Diagnosis present

## 2024-07-17 DIAGNOSIS — E785 Hyperlipidemia, unspecified: Secondary | ICD-10-CM | POA: Diagnosis present

## 2024-07-17 DIAGNOSIS — R9089 Other abnormal findings on diagnostic imaging of central nervous system: Secondary | ICD-10-CM | POA: Diagnosis not present

## 2024-07-17 DIAGNOSIS — Y92019 Unspecified place in single-family (private) house as the place of occurrence of the external cause: Secondary | ICD-10-CM | POA: Insufficient documentation

## 2024-07-17 DIAGNOSIS — G319 Degenerative disease of nervous system, unspecified: Secondary | ICD-10-CM | POA: Diagnosis not present

## 2024-07-17 DIAGNOSIS — Y92009 Unspecified place in unspecified non-institutional (private) residence as the place of occurrence of the external cause: Secondary | ICD-10-CM

## 2024-07-17 DIAGNOSIS — I6782 Cerebral ischemia: Secondary | ICD-10-CM | POA: Diagnosis not present

## 2024-07-17 DIAGNOSIS — S199XXA Unspecified injury of neck, initial encounter: Secondary | ICD-10-CM | POA: Diagnosis not present

## 2024-07-17 DIAGNOSIS — N179 Acute kidney failure, unspecified: Secondary | ICD-10-CM | POA: Diagnosis not present

## 2024-07-17 DIAGNOSIS — Z79899 Other long term (current) drug therapy: Secondary | ICD-10-CM | POA: Insufficient documentation

## 2024-07-17 DIAGNOSIS — M25811 Other specified joint disorders, right shoulder: Secondary | ICD-10-CM | POA: Diagnosis not present

## 2024-07-17 LAB — TROPONIN T, HIGH SENSITIVITY
Troponin T High Sensitivity: 121 ng/L (ref 0–19)
Troponin T High Sensitivity: 94 ng/L — ABNORMAL HIGH (ref 0–19)
Troponin T High Sensitivity: 110 ng/L (ref 0–19)

## 2024-07-17 LAB — CK: Total CK: 1341 U/L — ABNORMAL HIGH (ref 38–234)

## 2024-07-17 LAB — URINALYSIS, ROUTINE W REFLEX MICROSCOPIC
Bilirubin Urine: NEGATIVE
Glucose, UA: NEGATIVE mg/dL
Hgb urine dipstick: NEGATIVE
Ketones, ur: NEGATIVE mg/dL
Leukocytes,Ua: NEGATIVE
Nitrite: NEGATIVE
Protein, ur: NEGATIVE mg/dL
Specific Gravity, Urine: 1.017 (ref 1.005–1.030)
pH: 6 (ref 5.0–8.0)

## 2024-07-17 LAB — PROCALCITONIN: Procalcitonin: 0.37 ng/mL

## 2024-07-17 LAB — BASIC METABOLIC PANEL WITH GFR
Anion gap: 18 — ABNORMAL HIGH (ref 5–15)
BUN: 43 mg/dL — ABNORMAL HIGH (ref 8–23)
CO2: 24 mmol/L (ref 22–32)
Calcium: 10 mg/dL (ref 8.9–10.3)
Chloride: 99 mmol/L (ref 98–111)
Creatinine, Ser: 1.49 mg/dL — ABNORMAL HIGH (ref 0.44–1.00)
GFR, Estimated: 33 mL/min — ABNORMAL LOW (ref >60)
Glucose, Bld: 174 mg/dL — ABNORMAL HIGH (ref 70–99)
Potassium: 2.9 mmol/L — ABNORMAL LOW (ref 3.5–5.1)
Sodium: 141 mmol/L (ref 135–145)

## 2024-07-17 LAB — CBC
HCT: 41.3 % (ref 36.0–46.0)
Hemoglobin: 13.2 g/dL (ref 12.0–15.0)
MCH: 27.4 pg (ref 26.0–34.0)
MCHC: 32 g/dL (ref 30.0–36.0)
MCV: 85.9 fL (ref 80.0–100.0)
Platelets: 495 K/uL — ABNORMAL HIGH (ref 150–400)
RBC: 4.81 MIL/uL (ref 3.87–5.11)
RDW: 13.6 % (ref 11.5–15.5)
WBC: 22.8 K/uL — ABNORMAL HIGH (ref 4.0–10.5)
nRBC: 0 % (ref 0.0–0.2)

## 2024-07-17 LAB — MAGNESIUM: Magnesium: 1.9 mg/dL (ref 1.7–2.4)

## 2024-07-17 LAB — PHOSPHORUS: Phosphorus: 4.2 mg/dL (ref 2.5–4.6)

## 2024-07-17 LAB — LACTIC ACID, PLASMA: Lactic Acid, Venous: 2 mmol/L (ref 0.5–1.9)

## 2024-07-17 MED ORDER — POTASSIUM CHLORIDE CRYS ER 20 MEQ PO TBCR: 40.0000 meq | EXTENDED_RELEASE_TABLET | ORAL | Status: DC

## 2024-07-17 MED ORDER — SODIUM CHLORIDE 0.9 % IV BOLUS
1000.0000 mL | Freq: Once | INTRAVENOUS | Status: AC
  Administered 2024-07-17: 1000 mL via INTRAVENOUS

## 2024-07-17 MED ORDER — LIDOCAINE 5 % EX PTCH
1.0000 | MEDICATED_PATCH | CUTANEOUS | Status: DC
  Administered 2024-07-17 – 2024-07-18 (×2): 1 via TRANSDERMAL
  Filled 2024-07-17 (×3): qty 1

## 2024-07-17 MED ORDER — ALPRAZOLAM 0.25 MG PO TABS
0.2500 mg | ORAL_TABLET | Freq: Two times a day (BID) | ORAL | Status: DC | PRN
  Administered 2024-07-18: 0.25 mg via ORAL
  Filled 2024-07-17: qty 1

## 2024-07-17 MED ORDER — VANCOMYCIN HCL IN DEXTROSE 1-5 GM/200ML-% IV SOLN
1000.0000 mg | Freq: Once | INTRAVENOUS | Status: AC
  Administered 2024-07-17: 1000 mg via INTRAVENOUS
  Filled 2024-07-17: qty 200

## 2024-07-17 MED ORDER — AMLODIPINE BESYLATE 5 MG PO TABS
2.5000 mg | ORAL_TABLET | Freq: Every day | ORAL | Status: DC
  Administered 2024-07-18: 2.5 mg via ORAL
  Filled 2024-07-17 (×2): qty 1

## 2024-07-17 MED ORDER — ENOXAPARIN SODIUM 30 MG/0.3ML IJ SOSY
30.0000 mg | PREFILLED_SYRINGE | Freq: Every day | INTRAMUSCULAR | Status: DC
  Administered 2024-07-17 – 2024-07-18 (×2): 30 mg via SUBCUTANEOUS
  Filled 2024-07-17 (×2): qty 0.3

## 2024-07-17 MED ORDER — ATORVASTATIN CALCIUM 20 MG PO TABS
10.0000 mg | ORAL_TABLET | Freq: Every day | ORAL | Status: DC
  Administered 2024-07-18: 10 mg via ORAL
  Filled 2024-07-17 (×2): qty 1

## 2024-07-17 MED ORDER — ACETAMINOPHEN 325 MG PO TABS
650.0000 mg | ORAL_TABLET | Freq: Four times a day (QID) | ORAL | Status: DC | PRN
  Filled 2024-07-17: qty 2

## 2024-07-17 MED ORDER — HYDRALAZINE HCL 20 MG/ML IJ SOLN: 5.0000 mg | INTRAMUSCULAR | Status: DC | PRN

## 2024-07-17 MED ORDER — SERTRALINE HCL 50 MG PO TABS
25.0000 mg | ORAL_TABLET | Freq: Every day | ORAL | Status: DC
  Administered 2024-07-18: 25 mg via ORAL
  Filled 2024-07-17 (×2): qty 1

## 2024-07-17 MED ORDER — LACTATED RINGERS IV SOLN: INTRAVENOUS | Status: DC

## 2024-07-17 MED ORDER — ASPIRIN 81 MG PO TBEC
81.0000 mg | DELAYED_RELEASE_TABLET | Freq: Every day | ORAL | Status: DC
  Administered 2024-07-18: 81 mg via ORAL
  Filled 2024-07-17 (×2): qty 1

## 2024-07-17 MED ORDER — ONDANSETRON HCL 4 MG/2ML IJ SOLN: 4.0000 mg | Freq: Three times a day (TID) | INTRAMUSCULAR | Status: DC | PRN

## 2024-07-17 MED ORDER — ASPIRIN 81 MG PO CHEW
324.0000 mg | CHEWABLE_TABLET | Freq: Once | ORAL | Status: AC
  Administered 2024-07-17: 324 mg via ORAL
  Filled 2024-07-17: qty 4

## 2024-07-17 MED ORDER — SODIUM CHLORIDE 0.9 % IV SOLN
2.0000 g | Freq: Once | INTRAVENOUS | Status: AC
  Administered 2024-07-17: 2 g via INTRAVENOUS
  Filled 2024-07-17: qty 12.5

## 2024-07-17 MED ORDER — POTASSIUM CHLORIDE CRYS ER 20 MEQ PO TBCR
60.0000 meq | EXTENDED_RELEASE_TABLET | Freq: Once | ORAL | Status: AC
  Administered 2024-07-17: 60 meq via ORAL
  Filled 2024-07-17: qty 3

## 2024-07-17 MED ORDER — OXYCODONE-ACETAMINOPHEN 5-325 MG PO TABS: 1.0000 | ORAL_TABLET | ORAL | Status: DC | PRN

## 2024-07-17 NOTE — ED Notes (Signed)
PT transported to MRI

## 2024-07-17 NOTE — ED Notes (Signed)
 Assisted pt to in room toilet 1 assist, unsteady gait.  Pt able to provide urine sample at this time.  Pt helped back into bed, repositioned, warm blankets given.  No other needs voiced at this time.  Daughter remains at bedside.

## 2024-07-17 NOTE — H&P (Addendum)
 History and Physical    [name] FMW:[id number] DOB: [birth year] DOA: 07/17/2024  Referring MD/NP/PA:   PCP: Glendia Shad, MD   Patient coming from:  The patient is coming from home.     Chief Complaint: fall  HPI: Alicia Mccann is a 89 y.o. female with medical history significant of HTN, HLD, CKD-3a, depression with anxiety, dementia, who presents with fall.  Per report, pt fell last night and was found on the floor by family this AM. She has mild dementia, does not remember the fall. Pt has pain over her right shoulder which she attributes to arthritis.  Per ED physician, family reported that the patient had slurred speech this morning.  No unilateral numbness or tingling in extremities.  No facial droop.  Patient denies chest pain, cough, SOB.  Denies nausea, vomiting, diarrhea or abdominal pain.  No symptoms UTI.  No fever or chills.  She has bilateral leg edema,.   Data reviewed independently and ED Course: pt was found to have WBC 22.8, potassium 2.9, CK  1341, worsening renal function, troponin 121 -> 94, lactic acid 2.0, temperature normal, blood pressure 148/81, heart rate 103 --> 85, RR 18, oxygen saturation 100% on room air.  Chest x-ray negative.  CT of head negative for acute intracranial abnormalities. CT of C-spine negative for acute injury, but showed degenerative disc disease.  MRI of the brain negative for stroke.  X-ray of right shoulder is negative for bony fracture.  Patient is placed in [place] for observation.   EKG: I have personally reviewed.  Sinus rhythm, QTc 462, LAE, early R wave progression, PAC   Review of Systems:   General: no fevers, chills, no body weight gain, has fatigue HEENT: no blurry vision, hearing changes or sore throat Respiratory: no dyspnea, coughing, wheezing CV: no chest pain, no palpitations GI: no nausea, vomiting, abdominal pain, diarrhea, constipation GU: no dysuria, burning on urination, increased urinary frequency,  hematuria  Ext: has leg edema Neuro: no unilateral weakness, numbness, or tingling, no vision change or hearing loss. Has fall and slurred speech Skin: no rash, no skin tear. MSK: has right shoulder pain Heme: No easy bruising.  Travel history: No recent long distant travel.   Allergy:  Allergies  Allergen Reactions   Clindamycin /Lincomycin    Egg Protein (Egg White)    Egg Protein-Containing Drug Products    Flagyl  [Metronidazole ]    Levaquin [Levofloxacin]    Mobic  [Meloxicam ]     GI issues    Penicillins    Sulfa Antibiotics     Past Medical History:  Diagnosis Date   Allergic rhinitis    Anemia    Anxiety    Cellulitis    Cholelithiasis    Essential hypertension    History of stress test    a. 12/2008 Ex MV: EF 78%, no ischemia.   Insomnia    Osteoarthritis    Tachycardia    Valvular heart disease    a. 12/2008 Echo: EF 65%, mild MR;  b. 2/6 SEM RUSB - insignificant murmur, prob Ao Sclerosis.    Past Surgical History:  Procedure Laterality Date   BACK SURGERY     BUNIONECTOMY     CATARACT EXTRACTION W/PHACO Right 05/18/2019   Procedure: CATARACT EXTRACTION PHACO AND INTRAOCULAR LENS PLACEMENT (IOC) RIGHT MALYUGIN  01:06.5  20.6%  13.72;  Surgeon: Mittie Gaskin, MD;  Location: [facility];  Service: Ophthalmology;  Laterality: Right;  please leave patient arrival 10   CATARACT EXTRACTION  W/PHACO Left 06/08/2019   Procedure: CATARACT EXTRACTION PHACO AND INTRAOCULAR LENS PLACEMENT (IOC) LEFT  00:51.0  20.3%  10.37;  Surgeon: Mittie Gaskin, MD;  Location: [facility];  Service: Ophthalmology;  Laterality: Left;  ARRIVAL 10:30 PLEASE LEAVE   CHOLECYSTECTOMY  03/29/2007   GALLBLADDER SURGERY     HIP SURGERY     bilateral    REPLACEMENT TOTAL KNEE Right    THUMB ARTHROSCOPY      Social History:  reports that she has never smoked. She has never used smokeless tobacco. She reports current alcohol use. She reports that she does not use  drugs.  Family History:  Family History  Problem Relation Age of Onset   Heart attack Father    Stroke Father    Heart disease Father    CAD Father    Heart attack Son 62       MI   Hypertension Son    Hypertension Mother    Heart disease Mother    Stroke Mother    Breast cancer Neg Hx      Prior to Admission medications   Medication Sig Start Date End Date Taking? Authorizing Provider  acetaminophen  (TYLENOL ) 500 MG tablet Take 1,000 mg by mouth every 8 (eight) hours as needed for mild pain or moderate pain.    [provider]  ALPRAZolam  (XANAX ) 0.25 MG tablet TAKE 1 TABLET BY MOUTH DAILY AS NEEDED FOR ANXIETY 07/29/22   Glendia Shad, MD  amLODipine  (NORVASC ) 2.5 MG tablet TAKE 1 TABLET BY MOUTH DAILY 12/22/23   Gollan, Timothy J, MD  aspirin  81 MG EC tablet Take 81 mg by mouth daily. Swallow whole.    [provider]  atorvastatin  (LIPITOR) 10 MG tablet Take 1 tablet (10 mg total) by mouth daily. 06/29/24   Gollan, Timothy J, MD  cholecalciferol  (VITAMIN D ) 1000 UNITS tablet Take 1,000 Units by mouth every other day.     [provider]  cyanocobalamin  (VITAMIN B12) 1000 MCG tablet Take 1,000 mcg by mouth daily.    [provider]  hydrochlorothiazide  (HYDRODIURIL ) 25 MG tablet Take 25 mg by mouth.    [provider]  predniSONE  (DELTASONE ) 10 MG tablet 6 day taper - Take as directed 06/24/24   [provider]  sertraline  (ZOLOFT ) 25 MG tablet Take 1 tablet (25 mg total) by mouth daily. 11/26/23   Glendia Shad, MD  traMADol (ULTRAM) 50 MG tablet Take 50 mg by mouth. 07/04/24   [provider]  triamterene -hydrochlorothiazide  (MAXZIDE -25) 37.5-25 MG tablet Take 1 tablet by mouth daily. 06/29/24   Gollan, Timothy J, MD  zolpidem  (AMBIEN ) 5 MG tablet Take 1 tablet (5 mg total) by mouth at bedtime as needed for sleep. 11/04/22   Isaiah Scrivener, MD    Physical Exam: Vitals:   07/17/24 1526 07/17/24 1918 07/17/24 2100  07/17/24 2156  BP: (!) 144/83 (!) 148/81 128/86 (!) 147/74  Pulse: (!) 103 85 93 83  Resp: 18 16    Temp: 98 F (36.7 C) 98.1 F (36.7 C)  98 F (36.7 C)  SpO2: 100% 100% 100% 98%   General: Not in acute distress HEENT:       Eyes: PERRL, EOMI, no jaundice       ENT: No discharge from the ears and nose, no pharynx injection, no tonsillar enlargement.        Neck: No JVD, no bruit, no mass felt. Heme: No neck lymph node enlargement. Cardiac: S1/S2, RRR, 2/5 systolic  murmurs, No gallops or rubs. Respiratory: No rales, wheezing, rhonchi or rubs. GI: Soft, nondistended, nontender, no rebound pain, no organomegaly, BS present. GU: No hematuria Ext: 1+ pitting leg edema bilaterally. 1+DP/PT pulse bilaterally. Musculoskeletal: has right shoulder tenderness Skin: No rashes.  Neuro: Alert, following commands, cranial nerves II-XII grossly intact, moves all extremities normally. Muscle strength 5/5 in all extremities, sensation to light touch intact.  Psych: Patient is not psychotic, no suicidal or hemocidal ideation.  Labs on Admission: I have personally reviewed following labs and imaging studies  CBC: Recent Labs  Lab 07/17/24 1525  WBC 22.8*  HGB 13.2  HCT 41.3  MCV 85.9  PLT 495*   Basic Metabolic Panel: Recent Labs  Lab 07/17/24 1525 07/17/24 2212  NA 141  --   K 2.9*  --   CL 99  --   CO2 24  --   GLUCOSE 174*  --   BUN 43*  --   CREATININE 1.49*  --   CALCIUM  10.0  --   MG  --  1.9  PHOS  --  4.2   GFR: Estimated Creatinine Clearance: 20.2 mL/min (A) (by C-G formula based on SCr of 1.49 mg/dL (H)). Liver Function Tests: No results for input(s): AST, ALT, ALKPHOS, BILITOT, PROT, ALBUMIN in the last 168 hours. No results for input(s): LIPASE, AMYLASE in the last 168 hours. No results for input(s): AMMONIA in the last 168 hours. Coagulation Profile: No results for input(s): INR, PROTIME in the last 168 hours. Cardiac Enzymes: Recent  Labs  Lab 07/17/24 1525  CKTOTAL 1,341*   BNP (last 3 results) No results for input(s): PROBNP in the last 8760 hours. HbA1C: No results for input(s): HGBA1C in the last 72 hours. CBG: No results for input(s): GLUCAP in the last 168 hours. Lipid Profile: No results for input(s): CHOL, HDL, LDLCALC, TRIG, CHOLHDL, LDLDIRECT in the last 72 hours. Thyroid  Function Tests: No results for input(s): TSH, T4TOTAL, FREET4, T3FREE, THYROIDAB in the last 72 hours. Anemia Panel: No results for input(s): VITAMINB12, FOLATE, FERRITIN, TIBC, IRON, RETICCTPCT in the last 72 hours. Urine analysis:    Component Value Date/Time   COLORURINE YELLOW (A) 07/17/2024 2056   APPEARANCEUR CLEAR (A) 07/17/2024 2056   LABSPEC 1.017 07/17/2024 2056   PHURINE 6.0 07/17/2024 2056   GLUCOSEU NEGATIVE 07/17/2024 2056   GLUCOSEU NEGATIVE 12/29/2017 1410   HGBUR NEGATIVE 07/17/2024 2056   BILIRUBINUR NEGATIVE 07/17/2024 2056   BILIRUBINUR negative 12/09/2016 1007   KETONESUR NEGATIVE 07/17/2024 2056   PROTEINUR NEGATIVE 07/17/2024 2056   UROBILINOGEN 0.2 12/29/2017 1410   NITRITE NEGATIVE 07/17/2024 2056   LEUKOCYTESUR NEGATIVE 07/17/2024 2056   Sepsis Labs: @LABRCNTIP (procalcitonin:4,lacticidven:4) )No results found for this or any previous visit (from the past 240 hours).   Radiological Exams on Admission:   Assessment/Plan Principal Problem:   Rhabdomyolysis Active Problems:   Fall at home, initial encounter   Leukocytosis   Myocardial injury   Hypokalemia   HTN (hypertension)   HLD (hyperlipidemia)   Acute renal failure superimposed on stage 3a chronic kidney disease (HCC)   Depression with anxiety   Right shoulder pain   Assessment and Plan:  Rhabdomyolysis: CK 1341.  - will place tele bed for obs - IVF: received 1 L of NS in ED, will continue LR at 75 cc/h.  - check CK in AM  Fall at home, initial encounter: No acute injury by images.   Patient had slurred speech, but MRI of brain negative for stroke. -  Fall precaution - PT/OT  Leukocytosis: WBC 22.8, lactic acid 2.0.  No fever.  No source of infection identified.  Chest x-ray negative, UA negative.  Likely reactive.  - Patient received 1 dose of vancomycin and cefepime in ED - Hold antibiotics for now and check procalcitonin level - Follow-up of blood culture  Myocardial injury: Troponin 121 --> 94 --> 110, likely demand ischemia. - Aspirin , Lipitor - trend Troponin  Hypokalemia: Potassium 2.9 - Repleted potassium - Check phosphorus level --> 4.2 - Check magnesium level --> 1.9  HTN (hypertension) -Hold HCTZ, Maxzide  - Continue amlodipine  - IV hydralazine as needed  HLD (hyperlipidemia) -Lipitor  Acute renal failure superimposed on stage 3a chronic kidney disease (HCC): Baseline creatinine 0.95 on 05/07/2024.  Her creatinine is 1.49, BUN 43, GFR 33.  Likely due to dehydration and continuation of diuretics.  Rhabdomyolysis may have contributed partially - Hold HCTZ and Maxide - IV fluid as above  Depression with anxiety - Continue home as needed Xanax  and Zoloft   Right shoulder pain: X-ray negative for fracture.  Likely due to history of arthritis - As needed Percocet, Tylenol  - Lidoderm  patch      DVT ppx: SQ Lovenox   Code Status: Full code   Family Communication:     not done, no family member is at bed side.     Disposition Plan:  Anticipate discharge back to previous environment  Consults called:  none  Admission status and Level of care: Telemetry:    for obs      Dispo: The patient is from: Home              Anticipated d/c is to: Home              Anticipated d/c date is: 1 day              Patient currently is not medically stable to d/c.    Severity of Illness:  The appropriate patient status for this patient is INPATIENT. Inpatient status is judged to be reasonable and necessary in order to provide the required intensity of  service to ensure the patient's safety. The patient's presenting symptoms, physical exam findings, and initial radiographic and laboratory data in the context of their chronic comorbidities is felt to place them at high risk for further clinical deterioration. Furthermore, it is not anticipated that the patient will be medically stable for discharge from the hospital within 2 midnights of admission.   * I certify that at the point of admission it is my clinical judgment that the patient will require inpatient hospital care spanning beyond 2 midnights from the point of admission due to high intensity of service, high risk for further deterioration and high frequency of surveillance required.*       Date of Service 07/17/2024    Caleb Exon [facility]   If 7PM-7AM, please contact night-coverage [email or web address] 07/17/2024, 11:05 PM

## 2024-07-17 NOTE — ED Notes (Signed)
 Daugther notified mother going to room 106

## 2024-07-17 NOTE — Progress Notes (Signed)
 PHARMACIST - PHYSICIAN COMMUNICATION  CONCERNING:  Enoxaparin  (Lovenox ) for DVT Prophylaxis    RECOMMENDATION: Patient was prescribed enoxaprin 40mg  q24 hours for VTE prophylaxis.   There were no vitals filed for this visit.  There is no height or weight on file to calculate BMI.  Estimated Creatinine Clearance: 20.2 mL/min (A) (by C-G formula based on SCr of 1.49 mg/dL (H)).   Patient is candidate for enoxaparin  30mg  every 24 hours based on CrCl <59ml/min or Weight <45kg  DESCRIPTION: Pharmacy has adjusted enoxaparin  dose per [facility] policy.  Patient is now receiving enoxaparin  30 mg every 24 hours    Annabella LOISE Banks, PharmD Clinical Pharmacist  07/17/2024 9:24 PM

## 2024-07-17 NOTE — ED Provider Notes (Signed)
 [facility] Provider Note    Event Date/Time   First MD Initiated Contact with Patient 07/17/24 1604     (approximate)   History   Fall   HPI  Alicia Mccann is a 89 y.o. female who presents to the emergency department today after a fall.  Patient states that she fell last night.  She is not exactly sure what time.  Additionally she is not sure why she fell although she has a history of falls.  She denied any pain to myself but apparently complained of right shoulder pain to nursing staff.  She denies any chest pain or palpitations.  She denied any shortness of breath.  Denied any recent change in urination.  Family noticed slurred speech this morning.   Physical Exam   Triage Vital Signs: ED Triage Vitals  Encounter Vitals Group     BP 07/17/24 1526 (!) 144/83     Girls Systolic BP Percentile --      Girls Diastolic BP Percentile --      Boys Systolic BP Percentile --      Boys Diastolic BP Percentile --      Pulse Rate 07/17/24 1526 (!) 103     Resp 07/17/24 1526 18     Temp 07/17/24 1526 98 F (36.7 C)     Temp src --      SpO2 07/17/24 1526 100 %     Weight --      Height --      Head Circumference --      Peak Flow --      Pain Score 07/17/24 1524 3     Pain Loc --      Pain Education --      Exclude from Growth Chart --     Most recent vital signs: Vitals:   07/17/24 1526  BP: (!) 144/83  Pulse: (!) 103  Resp: 18  Temp: 98 F (36.7 C)  SpO2: 100%   General: Awake, alert, not completely oriented to events. CV:  Good peripheral perfusion. Regular rate and rhythm. Resp:  Normal effort. Lungs clear. Abd:  No distention.   ED Results / Procedures / Treatments   Labs (all labs ordered are listed, but only abnormal results are displayed) Labs Reviewed  CBC - Abnormal; Notable for the following components:      Result Value   WBC 22.8 (*)    Platelets 495 (*)    All other components within normal limits  BASIC METABOLIC  PANEL WITH GFR - Abnormal; Notable for the following components:   Potassium 2.9 (*)    Glucose, Bld 174 (*)    BUN 43 (*)    Creatinine, Ser 1.49 (*)    GFR, Estimated 33 (*)    Anion gap 18 (*)    All other components within normal limits  CK - Abnormal; Notable for the following components:   Total CK 1,341 (*)    All other components within normal limits  LACTIC ACID, PLASMA - Abnormal; Notable for the following components:   Lactic Acid, Venous 2.0 (*)    All other components within normal limits  TROPONIN T, HIGH SENSITIVITY - Abnormal; Notable for the following components:   Troponin T High Sensitivity 121 (*)    All other components within normal limits  TROPONIN T, HIGH SENSITIVITY - Abnormal; Notable for the following components:   Troponin T High Sensitivity 94 (*)    All other components within normal  limits  CULTURE, BLOOD (ROUTINE X 2)  CULTURE, BLOOD (ROUTINE X 2)  URINALYSIS, ROUTINE W REFLEX MICROSCOPIC  CK  MAGNESIUM  PHOSPHORUS  PROCALCITONIN  BASIC METABOLIC PANEL WITH GFR  CBC     EKG  I, Guadalupe Eagles, attending physician, personally viewed and interpreted this EKG  EKG Time: 1526 Rate: 90 Rhythm: sinus rhythm with PAC Axis: normal Intervals: qtc 462 QRS: narrow ST changes: no st elevation Impression: abnormal ekg  RADIOLOGY I independently interpreted and visualized the CT head/cervical spine. My interpretation: No ICH. No fracture Radiology interpretation:  IMPRESSION:  HEAD CT:    No acute or traumatic finding. Age related volume loss and chronic  small-vessel ischemic changes of the white matter.    CERVICAL SPINE CT:    No acute or traumatic finding. Advanced degenerative changes  throughout the cervical region as outlined above.    I independently interpreted and visualized the right shoulder. My interpretation: No fracture/dislocation Radiology interpretation:  IMPRESSION:  1. No fracture or dislocation.       PROCEDURES:  Critical Care performed: Yes  CRITICAL CARE Performed by: Guadalupe Eagles   Total critical care time: 30 minutes  Critical care time was exclusive of separately billable procedures and treating other patients.  Critical care was necessary to treat or prevent imminent or life-threatening deterioration.  Critical care was time spent personally by me on the following activities: development of treatment plan with patient and/or surrogate as well as nursing, discussions with consultants, evaluation of patient's response to treatment, examination of patient, obtaining history from patient or surrogate, ordering and performing treatments and interventions, ordering and review of laboratory studies, ordering and review of radiographic studies, pulse oximetry and re-evaluation of patient's condition.   Procedures    MEDICATIONS ORDERED IN ED: Medications - No data to display   IMPRESSION / MDM / ASSESSMENT AND PLAN / ED COURSE  I reviewed the triage vital signs and the nursing notes.                              Differential diagnosis includes, but is not limited to, UTI, rhabdo, ACS, AKI, anemia, ICH, CVA  Patient's presentation is most consistent with acute presentation with potential threat to life or bodily function.   The patient is on the cardiac monitor to evaluate for evidence of arrhythmia and/or significant heart rate changes.  Patient presented to the emergency department today after a fall.  Unclear how long patient was on the ground.  Had completed close abrasion shoulder pain.  X-ray did not show any fracture.  Head CT and cervical spine CT without fractures.  Did check blood work.  This was notable for several findings.  Patient was found to have leukocytosis and lactic acid was slightly elevated.  This raises concern for infection.  Unclear etiology at this time however will start antibiotics.  Additionally blood work shows acute kidney injury and  slightly elevated CK.  Furthermore the patient's troponin was significantly elevated.  She denied any chest pain.  Repeat shows improving troponin.  Discussed with Dr. Hilma with the hospitalist service who will evaluate for admission.      FINAL CLINICAL IMPRESSION(S) / ED DIAGNOSES   Final diagnoses:  Fall, initial encounter  Leukocytosis, unspecified type  Elevated troponin  AKI (acute kidney injury)      Note:  This document was prepared using Dragon voice recognition software and may include unintentional dictation errors.  Floy Roberts, MD 07/17/24 2117

## 2024-07-17 NOTE — ED Triage Notes (Signed)
 Pt comes with c/o fall. Pt states right arm pain. Pt was found in floor this morning. Pt doesn't remember the fall. Pt does have dementia.   Pt has bruises noted to neck, butt and arm per family.

## 2024-07-17 NOTE — ED Notes (Signed)
Fall bundle in place

## 2024-07-18 ENCOUNTER — Observation Stay: Admit: 2024-07-18 | Discharge: 2024-07-18 | Disposition: A | Attending: Internal Medicine

## 2024-07-18 ENCOUNTER — Observation Stay

## 2024-07-18 DIAGNOSIS — I349 Nonrheumatic mitral valve disorder, unspecified: Secondary | ICD-10-CM

## 2024-07-18 DIAGNOSIS — G8929 Other chronic pain: Secondary | ICD-10-CM

## 2024-07-18 DIAGNOSIS — K5730 Diverticulosis of large intestine without perforation or abscess without bleeding: Secondary | ICD-10-CM | POA: Diagnosis not present

## 2024-07-18 DIAGNOSIS — D72829 Elevated white blood cell count, unspecified: Secondary | ICD-10-CM | POA: Diagnosis not present

## 2024-07-18 DIAGNOSIS — T796XXA Traumatic ischemia of muscle, initial encounter: Secondary | ICD-10-CM

## 2024-07-18 DIAGNOSIS — E876 Hypokalemia: Secondary | ICD-10-CM | POA: Diagnosis not present

## 2024-07-18 DIAGNOSIS — E785 Hyperlipidemia, unspecified: Secondary | ICD-10-CM | POA: Diagnosis not present

## 2024-07-18 DIAGNOSIS — M25511 Pain in right shoulder: Secondary | ICD-10-CM

## 2024-07-18 DIAGNOSIS — W19XXXA Unspecified fall, initial encounter: Secondary | ICD-10-CM | POA: Diagnosis not present

## 2024-07-18 DIAGNOSIS — R7989 Other specified abnormal findings of blood chemistry: Secondary | ICD-10-CM

## 2024-07-18 DIAGNOSIS — N179 Acute kidney failure, unspecified: Secondary | ICD-10-CM | POA: Diagnosis not present

## 2024-07-18 DIAGNOSIS — R109 Unspecified abdominal pain: Secondary | ICD-10-CM | POA: Diagnosis not present

## 2024-07-18 DIAGNOSIS — I10 Essential (primary) hypertension: Secondary | ICD-10-CM | POA: Diagnosis not present

## 2024-07-18 LAB — CBC
HCT: 33.6 % — ABNORMAL LOW (ref 36.0–46.0)
Hemoglobin: 10.6 g/dL — ABNORMAL LOW (ref 12.0–15.0)
MCH: 27.6 pg (ref 26.0–34.0)
MCHC: 31.5 g/dL (ref 30.0–36.0)
MCV: 87.5 fL (ref 80.0–100.0)
Platelets: 337 K/uL (ref 150–400)
RBC: 3.84 MIL/uL — ABNORMAL LOW (ref 3.87–5.11)
RDW: 13.8 % (ref 11.5–15.5)
WBC: 23.5 K/uL — ABNORMAL HIGH (ref 4.0–10.5)
nRBC: 0 % (ref 0.0–0.2)

## 2024-07-18 LAB — MAGNESIUM: Magnesium: 1.8 mg/dL (ref 1.7–2.4)

## 2024-07-18 LAB — BASIC METABOLIC PANEL WITH GFR
Anion gap: 12 (ref 5–15)
BUN: 37 mg/dL — ABNORMAL HIGH (ref 8–23)
CO2: 23 mmol/L (ref 22–32)
Calcium: 9.1 mg/dL (ref 8.9–10.3)
Chloride: 102 mmol/L (ref 98–111)
Creatinine, Ser: 1.13 mg/dL — ABNORMAL HIGH (ref 0.44–1.00)
GFR, Estimated: 46 mL/min — ABNORMAL LOW (ref >60)
Glucose, Bld: 105 mg/dL — ABNORMAL HIGH (ref 70–99)
Potassium: 2.8 mmol/L — ABNORMAL LOW (ref 3.5–5.1)
Sodium: 137 mmol/L (ref 135–145)

## 2024-07-18 LAB — TROPONIN T, HIGH SENSITIVITY
Troponin T High Sensitivity: 112 ng/L (ref 0–19)
Troponin T High Sensitivity: 114 ng/L (ref 0–19)

## 2024-07-18 LAB — CK: Total CK: 396 U/L — ABNORMAL HIGH (ref 38–234)

## 2024-07-18 LAB — LACTIC ACID, PLASMA: Lactic Acid, Venous: 1.8 mmol/L (ref 0.5–1.9)

## 2024-07-18 MED ORDER — LACTATED RINGERS IV SOLN: INTRAVENOUS | Status: AC

## 2024-07-18 MED ORDER — MAGNESIUM SULFATE 2 GM/50ML IV SOLN
2.0000 g | Freq: Once | INTRAVENOUS | Status: AC
  Administered 2024-07-18: 2 g via INTRAVENOUS
  Filled 2024-07-18: qty 50

## 2024-07-18 MED ORDER — POTASSIUM CHLORIDE CRYS ER 20 MEQ PO TBCR
40.0000 meq | EXTENDED_RELEASE_TABLET | ORAL | Status: AC
  Administered 2024-07-18 (×3): 40 meq via ORAL
  Filled 2024-07-18 (×3): qty 2

## 2024-07-18 MED ORDER — TRAZODONE HCL 50 MG PO TABS
25.0000 mg | ORAL_TABLET | Freq: Every evening | ORAL | Status: DC | PRN
  Administered 2024-07-18: 25 mg via ORAL
  Filled 2024-07-18: qty 1

## 2024-07-18 NOTE — Assessment & Plan Note (Signed)
 Slight worsening of leukocytosis now at 23.5, procalcitonin of 0.37.  Patient did had a diarrhea prior to this admission. Rest of the infectious workup negative so far.  Did receive 1 dose of cefepime and vancomycin in ED and antibiotics were not continued. - Ordered CT abdomen and pelvis to rule out any colitis or diverticulitis

## 2024-07-18 NOTE — TOC Initial Note (Signed)
 Transition of Care [facility]) - Initial/Assessment Note    Patient Details  Name: Alicia Mccann MRN: [id number] Date of Birth: 07-30-1933  Transition of Care [facility]) CM/SW Contact:    Dalia GORMAN Fuse, RN Phone Number: 07/18/2024, 2:14 PM  Clinical Narrative:                 TOC spoke with the patient's daughter. The patient is from home with in home caregivers. The caregivers are in the home during wake hours. They get the patient back to bed before they leave for the day. The patient has a walker and cane in the room. Her caregivers drive her to and from appointments and to run errands. Her daughter advised that she lives out of town but can provide additional support. Recs are for [facility] PT/OT. The patient's daughter doesn't have a preference.         Patient Goals and CMS Choice            Expected Discharge Plan and Services                                              Prior Living Arrangements/Services                       Activities of Daily Living      Permission Sought/Granted                  Emotional Assessment              Admission diagnosis:  Rhabdomyolysis [M62.82] Elevated troponin [R79.89] AKI (acute kidney injury) [N17.9] Fall, initial encounter [W19.XXXA] Leukocytosis, unspecified type [D72.829] Patient Active Problem List   Diagnosis Date Noted   Rhabdomyolysis 07/17/2024   HTN (hypertension) 07/17/2024   HLD (hyperlipidemia) 07/17/2024   Fall at home, initial encounter 07/17/2024   Acute renal failure superimposed on stage 3a chronic kidney disease (HCC) 07/17/2024   Depression with anxiety 07/17/2024   Myocardial injury 07/17/2024   Right shoulder pain 07/17/2024   Falls 04/04/2023   Skin lesion 02/01/2023   Sleep difficulties 11/04/2022   Nocturia 07/08/2022   Dysuria 07/01/2022   Bilateral shoulder pain 05/27/2022   Rash 04/06/2022   Leukocytosis 09/17/2021   Shortness of breath    Hypoxia     Hypokalemia 08/09/2021   Hyponatremia 08/09/2021   Syncope 07/20/2021   Joint pain 04/07/2021   Unsteady gait 12/18/2019   Depression, recurrent 07/31/2019   Memory changes 07/31/2019   Left knee pain 05/08/2019   Aortic atherosclerosis 05/10/2018   Coronary artery calcification seen on CAT scan 05/10/2018   Decreased hearing 05/02/2018   History of total right knee replacement 01/21/2018   Chronic constipation    Noninfectious gastroenteritis    Lung nodule 01/17/2018   Anemia 01/14/2018   Hyperglycemia 01/09/2018   Colitis 01/09/2018   Right knee pain 11/28/2017   Light headedness 10/21/2017   Healthcare maintenance 06/25/2017   CKD (chronic kidney disease) stage 3, GFR 30-59 ml/min (HCC) 06/25/2017   Valvular heart disease 03/03/2017   Malaise and fatigue 01/05/2017   PAD (peripheral artery disease) 05/19/2016   Diverticulitis 05/25/2015   Abdominal pain 05/24/2015   Allergic rhinitis 04/30/2015   Absolute anemia 04/30/2015   Anxiety 04/30/2015   Carpal tunnel syndrome 04/30/2015   Chronic low back pain 04/30/2015   Edema of  extremities 04/30/2015   Cannot sleep 04/30/2015   Arthritis sicca 04/30/2015   Seasonal affective disorder 04/30/2015   Heart murmur 06/27/2013   Essential hypertension 06/27/2013   Osteoarthritis 06/27/2013   Mixed hyperlipidemia 06/27/2013   Cholelithiasis without obstruction 03/22/2007   PCP:  Glendia Shad, MD Pharmacy:   [facility] - [place], [place] - [address] [place] [address] Phone: [phone] Fax: [phone]     Social Drivers of Health (SDOH) Social History: SDOH Screenings   Food Insecurity: No Food Insecurity (06/13/2024)   Received from [facility]  Housing: Low Risk  (06/13/2024)   Received from [facility]  Transportation Needs: No Transportation Needs (06/13/2024)   Received from [facility]  Utilities: Not At Risk  (06/13/2024)   Received from [facility]  Depression [phone]): Medium Risk (12/24/2023)  Financial Resource Strain: Low Risk  (06/13/2024)   Received from [facility]  Physical Activity: Insufficiently Active (10/04/2021)  Social Connections: Socially Isolated (06/02/2023)  Stress: No Stress Concern Present (10/04/2021)  Tobacco Use: Low Risk  (07/17/2024)   SDOH Interventions:     Readmission Risk Interventions     No data to display

## 2024-07-18 NOTE — Assessment & Plan Note (Signed)
 Creatinine improved to 1.13, baseline around 1. - Continue with gentle IV fluid for another day -Monitor renal function -Avoid nephrotoxins

## 2024-07-18 NOTE — Hospital Course (Addendum)
 Partly taken from H&P.  Alicia Mccann is a 89 y.o. female with medical history significant of HTN, HLD, CKD-3a, depression with anxiety, dementia, who presents with fall.  Patient fell last night and was found on the floor by family member on the morning of admission.  She was complaining of pain in the right shoulder.  On presentation hemodynamically stable, labs with leukocytosis at 22.8, potassium 2.9, CK1341, UA negative, worsening renal function, troponin 121>> 94. Chest x-ray negative.  CT head and cervical spine was negative for any acute injury, did show degenerative disc disease.  MRI of brain was negative for stroke.  X-ray of the right shoulder was negative for any bony fracture.  Patient was given some IV fluid, PT and OT evaluation ordered.  12/1: Vital stable, CK improved to 396, worsening leukocytosis now at 23.5, potassium at 2.8, some improvement in creatinine to 1.13.  Troponin 114. Echocardiogram ordered.  PT is recommending home health if there is a 24/7 supervision available.  Apparently does have private caregivers.  12/2: Blood pressure mildly elevated, we increased the home dose of amlodipine  to 5 mg daily and discontinued Maxide as he appears dry.  Echocardiogram with normal EF, grade 1 diastolic dysfunction and no other significant abnormality. Improving leukocytosis which was likely reactive.  Renal function has been normalized.  Mild hypophosphatemia which was repleted.  Magnesium at 1.7-received 2 g of magnesium before discharge.  Patient will continue the rest of her home medications and follow-up with her providers for further assistance.

## 2024-07-18 NOTE — Progress Notes (Signed)
 Progress Note   [name] FMW:[id number] DOB: August 29, 1932 DOA: 07/17/2024     0 DOS: the patient was seen and examined on 07/18/2024   Brief hospital course: Partly taken from H&P.  Alicia Mccann is a 89 y.o. female with medical history significant of HTN, HLD, CKD-3a, depression with anxiety, dementia, who presents with fall.  Patient fell last night and was found on the floor by family member on the morning of admission.  She was complaining of pain in the right shoulder.  On presentation hemodynamically stable, labs with leukocytosis at 22.8, potassium 2.9, CK1341, UA negative, worsening renal function, troponin 121>> 94. Chest x-ray negative.  CT head and cervical spine was negative for any acute injury, did show degenerative disc disease.  MRI of brain was negative for stroke.  X-ray of the right shoulder was negative for any bony fracture.  Patient was given some IV fluid, PT and OT evaluation ordered.  12/1: Vital stable, CK improved to 396, worsening leukocytosis now at 23.5, potassium at 2.8, some improvement in creatinine to 1.13.  Troponin 114. Echocardiogram ordered.  PT is recommending home health if there is a 24/7 supervision available.  Apparently does have private caregivers.  Assessment and Plan: * Rhabdomyolysis Likely secondary to fall and staying on floor overnight.  CK improved to 396. - Giving some more gentle IV fluid -Continue to monitor  Fall at home, initial encounter No acute injuries on imaging.  CT head and MRI brain negative for any acute abnormality. - PT is recommending 24/7 supervision and home health  Leukocytosis Slight worsening of leukocytosis now at 23.5, procalcitonin of 0.37.  Patient did had a diarrhea prior to this admission. Rest of the infectious workup negative so far.  Did receive 1 dose of cefepime and vancomycin in ED and antibiotics were not continued. - Ordered CT abdomen and pelvis to rule out any colitis or  diverticulitis  Myocardial injury Troponin 121>94>110>114.  No chest pain, likely demand ischemia. - Echocardiogram ordered -Continue home aspirin  and Lipitor  Hypokalemia Potassium of 2.8 with magnesium of 1.8 this morning. -Replace potassium and magnesium -Continue to monitor  Acute renal failure superimposed on stage 3a chronic kidney disease (HCC) Creatinine improved to 1.13, baseline around 1. - Continue with gentle IV fluid for another day -Monitor renal function -Avoid nephrotoxins  HTN (hypertension) Blood pressure is within goal. - Continue holding HCTZ, Maxide -Continue with amlodipine  -As needed hydralazine  HLD (hyperlipidemia) - Continue Lipitor  Right shoulder pain X-ray negative for fracture.  Likely due to history of arthritis - As needed Percocet, Tylenol  - Lidoderm  patch  Depression with anxiety - Continue home Zoloft  and as needed Xanax    Subjective: Patient was seen and examined today.  Complaining of generalized joint pain stating this is my arthritis.  Did hydrate diarrhea for couple of days and was feeling dizzy before having this fall.  No BM since in the hospital.  Physical Exam: Vitals:   07/17/24 2100 07/17/24 2156 07/18/24 0357 07/18/24 0727  BP: 128/86 (!) 147/74 127/73 136/65  Pulse: 93 83 89 96  Resp:    14  Temp:  98 F (36.7 C) (!) 97.5 F (36.4 C) 98.4 F (36.9 C)  TempSrc:   Oral Oral  SpO2: 100% 98% 98% 98%   General.  Frail and hard of hearing elderly lady, in no acute distress. Pulmonary.  Lungs clear bilaterally, normal respiratory effort. CV.  Regular rate and rhythm, no JVD, rub or murmur. Abdomen.  Soft, nontender, nondistended, BS positive. CNS.  Alert and oriented .  No focal neurologic deficit. Extremities.  No edema,  pulses intact and symmetrical.  Data Reviewed: Prior data reviewed  Family Communication: Discussed with daughter at bedside  Disposition: Status is: Observation The patient will require care  spanning > 2 midnights and should be moved to inpatient because: Severity of illness  Planned Discharge Destination: Home with Home Health  DVT prophylaxis.  Lovenox  Time spent: 50 minutes  This record has been created using Conservation officer, historic buildings. Errors have been sought and corrected,but may not always be located. Such creation errors do not reflect on the standard of care.   Author: Amaryllis Dare, MD 07/18/2024 3:27 PM  For on call review [email or web address].

## 2024-07-18 NOTE — Evaluation (Signed)
 Occupational Therapy Evaluation Patient Details Name: Alicia Mccann MRN: [id number] DOB: [birth year] Today's Date: 07/18/2024   History of Present Illness   Pt is a 89 year old female with fall and found on the floor by family. Found to have rhabdomyolysis, Leukocytosis, myocardial injury, hypokalemia, Acute renal failure superimposed on stage 3a chronic kidney disease, right shoulder pain with X-ray negative. PMH: HTN, HLD, CKD-3a, depression with anxiety, dementia.     Clinical Impressions Pt was seen for OT evaluation this date. PTA, pt resides at home alone with intermittent caregiver support. She typically ambulates independently with a cane and is able to manage her ADLs. Pt endorses multiple falls at home. Pt presents with deficits in strength, balance, ROM, and safety awareness, affecting safe and optimal ADL completion. Pt currently requires assistance for bed mobility, transfers, ambulation and ADLs. Posterior lean noted with initial seated balance and standing, constant external support to prevent posterior LOB during standing grooming tasks. Max A for LB dressing and Min A for ambulation using RW. Pt is a high fall risk. Would require increased caregiver support to safely return home. Pt would benefit from skilled OT services to address noted impairments and functional limitations to maximize safety and independence while minimizing future risk of falls, injury, and readmission. Do anticipate the need for follow up OT services upon acute hospital DC.      If plan is discharge home, recommend the following:   A little help with walking and/or transfers;A lot of help with bathing/dressing/bathroom;A little help with bathing/dressing/bathroom;Help with stairs or ramp for entrance;Assistance with cooking/housework     Functional Status Assessment   Patient has had a recent decline in their functional status and demonstrates the ability to make significant improvements in function  in a reasonable and predictable amount of time.     Equipment Recommendations   BSC/3in1;Tub/shower seat;Other (comment) (RW)     Recommendations for Other Services         Precautions/Restrictions   Precautions Precautions: Fall Recall of Precautions/Restrictions: Impaired Restrictions Weight Bearing Restrictions Per Provider Order: No     Mobility Bed Mobility Overal bed mobility: Needs Assistance Bed Mobility: Supine to Sit     Supine to sit: Min assist, Mod assist     General bed mobility comments: pt seated EOB upon entry with PT, posterior lean noted initially required Min/Mod A to maintain trunk in upright position after falling back on to bed    Transfers Overall transfer level: Needs assistance Equipment used: Rolling walker (2 wheels) Transfers: Sit to/from Stand Sit to Stand: Min assist, Contact guard assist           General transfer comment: initially required Min A for standing and ambulation with posterior lean, however as session went on improved to CGA; remains a high fall risk      Balance Overall balance assessment: Needs assistance Sitting-balance support: Feet supported, Bilateral upper extremity supported Sitting balance-Leahy Scale: Fair Sitting balance - Comments: initially poor balance with posterior lean requiring assist to maintain upright position, improved after ambulation bouts able to sit with CGA   Standing balance support: Reliant on assistive device for balance, Bilateral upper extremity supported Standing balance-Leahy Scale: Poor Standing balance comment: RW and Min/CGA from external support                           ADL either performed or assessed with clinical judgement   ADL Overall ADL's : Needs assistance/impaired  Grooming: Wash/dry face;Oral care;Standing;Contact guard assist               Lower Body Dressing: Maximal assistance;Sitting/lateral leans Lower Body Dressing Details  (indicate cue type and reason): to don bil shoes             Functional mobility during ADLs: Contact guard assist;Minimal assistance;Rolling walker (2 wheels);Cueing for safety       Vision         Perception         Praxis         Pertinent Vitals/Pain Pain Assessment Pain Assessment: Faces Faces Pain Scale: Hurts even more Pain Location: right shoulder, right leg Pain Descriptors / Indicators: Discomfort Pain Intervention(s): Limited activity within patient's tolerance, Monitored during session, Repositioned     Extremity/Trunk Assessment Upper Extremity Assessment Upper Extremity Assessment: Generalized weakness;Right hand dominant;RUE deficits/detail;LUE deficits/detail RUE Deficits / Details: less han 90 degrees shoulder flexion, pain limited and arthritis LUE Deficits / Details: poor ROM but able to reach head for grooming tasks   Lower Extremity Assessment Lower Extremity Assessment: Generalized weakness       Communication Communication Communication: No apparent difficulties Factors Affecting Communication: Hearing impaired (hearing aide in place)   Cognition Arousal: Alert Behavior During Therapy: [facility] for tasks assessed/performed                                 Following commands: Impaired Following commands impaired: Follows multi-step commands with increased time     Cueing  General Comments   Cueing Techniques: Verbal cues;Tactile cues  asked multiple times during session if she is going home today and persevrating on RUE deficits and pain   Exercises Other Exercises Other Exercises: Edu on role of OT in acute setting.   Shoulder Instructions      Home Living Family/patient expects to be discharged to:: Private residence Living Arrangements: Alone Available Help at Discharge: Personal care attendant;Available PRN/intermittently Type of Home: House Home Access: Stairs to enter [employer] of Steps:  2 Entrance Stairs-Rails: Right;Left Home Layout: One level     Bathroom Shower/Tub: Walk-in shower         Home Equipment: Agricultural Consultant (2 wheels);Cane - single point          Prior Functioning/Environment Prior Level of Function : Independent/Modified Independent             Mobility Comments: patient reports history of frequent falls. she is ambulatory with cane ADLs Comments: assistance for cooking with PRN caregiver support at home. she reports she does her own bathing and dressing    OT Problem List: Decreased strength;Pain;Decreased range of motion;Decreased activity tolerance;Decreased safety awareness;Impaired balance (sitting and/or standing);Impaired UE functional use   OT Treatment/Interventions: Self-care/ADL training;Therapeutic exercise;Therapeutic activities;Patient/family education;DME and/or AE instruction;Balance training      OT Goals(Current goals can be found in the care plan section)   Acute Rehab OT Goals Patient Stated Goal: get better, improve pain OT Goal Formulation: With patient Time For Goal Achievement: 08/01/24 Potential to Achieve Goals: Good ADL Goals Pt Will Perform Grooming: with supervision;standing Pt Will Perform Lower Body Dressing: with supervision;sit to/from stand;sitting/lateral leans Pt Will Transfer to Toilet: with supervision;with modified independence;ambulating   OT Frequency:  Min 2X/week    Co-evaluation              AM-PAC OT 6 Clicks Daily Activity     Outcome Measure Help from  another person eating meals?: A Little Help from another person taking care of personal grooming?: A Little Help from another person toileting, which includes using toliet, bedpan, or urinal?: A Little Help from another person bathing (including washing, rinsing, drying)?: A Lot Help from another person to put on and taking off regular upper body clothing?: A Little Help from another person to put on and taking off regular  lower body clothing?: A Lot 6 Click Score: 16   End of Session Equipment Utilized During Treatment: Gait belt;Rolling walker (2 wheels) Nurse Communication: Mobility status  Activity Tolerance: Patient tolerated treatment well Patient left: in bed;with call bell/phone within reach;with bed alarm set  OT Visit Diagnosis: Other abnormalities of gait and mobility (R26.89);Muscle weakness (generalized) (M62.81);Unsteadiness on feet (R26.81);Repeated falls (R29.6)                Time: 9163-9097 OT Time Calculation (min): 26 min Charges:  OT General Charges $OT Visit: 1 Visit OT Evaluation $OT Eval Moderate Complexity: 1 Mod Taela Charbonneau Chrismon, OTR/L 07/18/24, 10:14 AM  Siddhi Dornbush E Chrismon 07/18/2024, 10:13 AM

## 2024-07-18 NOTE — Assessment & Plan Note (Signed)
 X-ray negative for fracture.  Likely due to history of arthritis - As needed Percocet, Tylenol  - Lidoderm  patch

## 2024-07-18 NOTE — Assessment & Plan Note (Signed)
 Troponin 121>94>110>114.  No chest pain, likely demand ischemia. - Echocardiogram ordered -Continue home aspirin  and Lipitor

## 2024-07-18 NOTE — Care Management Obs Status (Signed)
 MEDICARE OBSERVATION STATUS NOTIFICATION   Patient Details  Name: Alicia Mccann MRN: [id number] Date of Birth: 1933-03-01   Medicare Observation Status Notification Given:  [name], CMA 07/18/2024, 10:14 AM

## 2024-07-18 NOTE — Assessment & Plan Note (Signed)
 No acute injuries on imaging.  CT head and MRI brain negative for any acute abnormality. - PT is recommending 24/7 supervision and home health

## 2024-07-18 NOTE — Assessment & Plan Note (Signed)
 Potassium of 2.8 with magnesium of 1.8 this morning. -Replace potassium and magnesium -Continue to monitor

## 2024-07-18 NOTE — Assessment & Plan Note (Signed)
-   Continue home Zoloft  and as needed Xanax

## 2024-07-18 NOTE — Assessment & Plan Note (Signed)
 Likely secondary to fall and staying on floor overnight.  CK improved to 396. - Giving some more gentle IV fluid -Continue to monitor

## 2024-07-18 NOTE — Assessment & Plan Note (Signed)
 -  Continue Lipitor

## 2024-07-18 NOTE — Evaluation (Signed)
 Physical Therapy Evaluation Patient Details Name: Alicia Mccann MRN: [id number] DOB: 04-May-1933 Today's Date: 07/18/2024  History of Present Illness  Pt is a 89 year old female with fall and found on the floor by family. Found to have rhabdomyolysis, Leukocytosis, myocardial injury, hypokalemia, Acute renal failure superimposed on stage 3a chronic kidney disease, right shoulder pain with X-ray negative. PMH: HTN, HLD, CKD-3a, depression with anxiety, dementia.   Clinical Impression  Patient is agreeable to PT evaluation. She was sleeping on arrival to room, initial disoriented but with improved cognition with mobility. She reports she lives alone with her dog and has a caregiver that helps PRN. She reports having 2 steps to get into her home and ambulating with a cane. She reports numerous falls.   Today the patient required physical assistance with mobility. She was able to ambulate with rolling walker with cues for safety and proper use of DME. She was able to navigate 2 steps to simulate home entry with cues for safety required. The patient has generalized weakness and is a fall risk. Anticipate patient will need continued/increased caregiver support if going home. Otherwise may need to consider rehabilitation < 3 hours/day pending patient and family goals.       If plan is discharge home, recommend the following: A little help with walking and/or transfers;A little help with bathing/dressing/bathroom;Assist for transportation;Help with stairs or ramp for entrance;Supervision due to cognitive status;Assistance with cooking/housework   Can travel by private vehicle        Equipment Recommendations None recommended by PT  Recommendations for Other Services       Functional Status Assessment Patient has had a recent decline in their functional status and demonstrates the ability to make significant improvements in function in a reasonable and predictable amount of time.      Precautions / Restrictions Precautions Precautions: Fall Recall of Precautions/Restrictions: Impaired Restrictions Weight Bearing Restrictions Per Provider Order: No      Mobility  Bed Mobility Overal bed mobility: Needs Assistance Bed Mobility: Supine to Sit, Sit to Supine     Supine to sit: Min assist Sit to supine: Min assist   General bed mobility comments: assistance for LE and trunk support. joint pain and stiffness reported with initially getting upright    Transfers Overall transfer level: Needs assistance Equipment used: Rolling walker (2 wheels) Transfers: Sit to/from Stand Sit to Stand: Min assist           General transfer comment: lifting assistance required for standing. cues for safety, hand placement. balance improves with increased mobility    Ambulation/Gait Ambulation/Gait assistance: Min assist, Contact guard assist Gait Distance (Feet): 120 Feet Assistive device: Rolling walker (2 wheels) Gait Pattern/deviations: Step-through pattern, Narrow base of support Gait velocity: decreased     General Gait Details: frequent cues to stand closer to rolling walker for support. intermittent steadying assistance provided with one loss of balance  Stairs Stairs: Yes Stairs assistance: Contact guard assist Stair Management: Two rails, Step to pattern, Forwards, Backwards Number of Stairs: 2 General stair comments: patient went up 2 steps forwards and down going backwards. cues for safety and technique  Wheelchair Mobility     Tilt Bed    Modified Rankin (Stroke Patients Only)       Balance Overall balance assessment: Needs assistance Sitting-balance support: Feet supported Sitting balance-Leahy Scale: Poor Sitting balance - Comments: initially poor with posterior loss of balance with improving balance with increased activity   Standing balance support: Single extremity  supported Standing balance-Leahy Scale: Poor Standing balance comment:  external support required intermittently                             Pertinent Vitals/Pain Pain Assessment Pain Assessment: Faces Faces Pain Scale: Hurts even more Pain Location: right shoulder, right leg Pain Descriptors / Indicators: Discomfort Pain Intervention(s): Monitored during session, Limited activity within patient's tolerance, Repositioned    Home Living Family/patient expects to be discharged to:: Private residence Living Arrangements: Alone Available Help at Discharge: Personal care attendant;Available PRN/intermittently Type of Home: House Home Access: Stairs to enter Entrance Stairs-Rails: Right;Left Entrance Stairs-Number of Steps: 2   Home Layout: One level Home Equipment: Agricultural Consultant (2 wheels);Cane - single point      Prior Function Prior Level of Function : Independent/Modified Independent             Mobility Comments: patient reports history of frequent falls. she is ambulatory with cane ADLs Comments: assistance for cooking with PRN caregiver support at home. she reports she does her own bathing and dressing     Extremity/Trunk Assessment   Upper Extremity Assessment Upper Extremity Assessment: Generalized weakness;Right hand dominant;RUE deficits/detail;LUE deficits/detail RUE Deficits / Details: less han 90 degrees shoulder flexion, pain limited and arthritis LUE Deficits / Details: poor ROM but able to reach head for grooming tasks    Lower Extremity Assessment Lower Extremity Assessment: Generalized weakness       Communication   Communication Communication: No apparent difficulties Factors Affecting Communication: Hearing impaired (hearing aide in place)    Cognition Arousal: Alert Behavior During Therapy: WFL for tasks assessed/performed   PT - Cognitive impairments: History of cognitive impairments, Memory, Safety/Judgement                       PT - Cognition Comments: patient initially disoriented as  she was woken up from sleeping. improved cognition with re-orienting Following commands: Impaired Following commands impaired: Follows one step commands with increased time     Cueing Cueing Techniques: Verbal cues, Tactile cues, Visual cues     General Comments General comments (skin integrity, edema, etc.): asked multiple times during session if she is going home today and persevrating on RUE deficits and pain    Exercises     Assessment/Plan    PT Assessment Patient needs continued PT services  PT Problem List Decreased strength;Decreased range of motion;Decreased activity tolerance;Decreased balance;Decreased mobility;Decreased cognition;Decreased knowledge of use of DME;Decreased safety awareness       PT Treatment Interventions DME instruction;Gait training;Stair training;Functional mobility training;Therapeutic activities;Therapeutic exercise;Balance training;Neuromuscular re-education;Cognitive remediation;Patient/family education;Wheelchair mobility training    PT Goals (Current goals can be found in the Care Plan section)  Acute Rehab PT Goals Patient Stated Goal: to go home when ready PT Goal Formulation: With patient Time For Goal Achievement: 08/01/24 Potential to Achieve Goals: Fair    Frequency Min 2X/week     Co-evaluation               AM-PAC PT 6 Clicks Mobility  Outcome Measure Help needed turning from your back to your side while in a flat bed without using bedrails?: None Help needed moving from lying on your back to sitting on the side of a flat bed without using bedrails?: A Little Help needed moving to and from a bed to a chair (including a wheelchair)?: A Little Help needed standing up from a chair using your arms (  e.g., wheelchair or bedside chair)?: A Little Help needed to walk in hospital room?: A Little Help needed climbing 3-5 steps with a railing? : A Little 6 Click Score: 19    End of Session   Activity Tolerance: Patient  tolerated treatment well Patient left: in bed;with call bell/phone within reach;with bed alarm set   PT Visit Diagnosis: Muscle weakness (generalized) (M62.81);Unsteadiness on feet (R26.81)    Time: 9171-9097 PT Time Calculation (min) (ACUTE ONLY): 34 min   Charges:   PT Evaluation $PT Eval Moderate Complexity: 1 Mod PT Treatments $Gait Training: 8-22 mins PT General Charges $$ ACUTE PT VISIT: 1 Visit        Randine Essex, PT, MPT   Randine LULLA Essex 07/18/2024, 10:45 AM

## 2024-07-18 NOTE — Assessment & Plan Note (Signed)
 Blood pressure is within goal. - Continue holding HCTZ, Maxide -Continue with amlodipine  -As needed hydralazine

## 2024-07-19 DIAGNOSIS — D72829 Elevated white blood cell count, unspecified: Secondary | ICD-10-CM | POA: Diagnosis not present

## 2024-07-19 DIAGNOSIS — E876 Hypokalemia: Secondary | ICD-10-CM | POA: Diagnosis not present

## 2024-07-19 DIAGNOSIS — T796XXA Traumatic ischemia of muscle, initial encounter: Secondary | ICD-10-CM | POA: Diagnosis not present

## 2024-07-19 DIAGNOSIS — G8929 Other chronic pain: Secondary | ICD-10-CM | POA: Diagnosis not present

## 2024-07-19 DIAGNOSIS — F418 Other specified anxiety disorders: Secondary | ICD-10-CM | POA: Diagnosis not present

## 2024-07-19 DIAGNOSIS — I5A Non-ischemic myocardial injury (non-traumatic): Secondary | ICD-10-CM | POA: Diagnosis not present

## 2024-07-19 DIAGNOSIS — R7989 Other specified abnormal findings of blood chemistry: Secondary | ICD-10-CM | POA: Diagnosis not present

## 2024-07-19 DIAGNOSIS — M25511 Pain in right shoulder: Secondary | ICD-10-CM | POA: Diagnosis not present

## 2024-07-19 DIAGNOSIS — N179 Acute kidney failure, unspecified: Secondary | ICD-10-CM | POA: Diagnosis not present

## 2024-07-19 LAB — CBC WITH DIFFERENTIAL/PLATELET
Abs Immature Granulocytes: 0.11 K/uL — ABNORMAL HIGH (ref 0.00–0.07)
Basophils Absolute: 0.1 K/uL (ref 0.0–0.1)
Basophils Relative: 0 %
Eosinophils Absolute: 0.4 K/uL (ref 0.0–0.5)
Eosinophils Relative: 2 %
HCT: 32 % — ABNORMAL LOW (ref 36.0–46.0)
Hemoglobin: 10 g/dL — ABNORMAL LOW (ref 12.0–15.0)
Immature Granulocytes: 1 %
Lymphocytes Relative: 13 %
Lymphs Abs: 2.5 K/uL (ref 0.7–4.0)
MCH: 27.5 pg (ref 26.0–34.0)
MCHC: 31.3 g/dL (ref 30.0–36.0)
MCV: 88.2 fL (ref 80.0–100.0)
Monocytes Absolute: 1 K/uL (ref 0.1–1.0)
Monocytes Relative: 6 %
Neutro Abs: 14.9 K/uL — ABNORMAL HIGH (ref 1.7–7.7)
Neutrophils Relative %: 78 %
Platelets: 320 K/uL (ref 150–400)
RBC: 3.63 MIL/uL — ABNORMAL LOW (ref 3.87–5.11)
RDW: 13.9 % (ref 11.5–15.5)
WBC: 19 K/uL — ABNORMAL HIGH (ref 4.0–10.5)
nRBC: 0 % (ref 0.0–0.2)

## 2024-07-19 LAB — ECHOCARDIOGRAM COMPLETE
AR max vel: 2.35 cm2
AV Area VTI: 2.11 cm2
AV Area mean vel: 2.52 cm2
AV Mean grad: 4 mmHg
AV Peak grad: 8.4 mmHg
Ao pk vel: 1.45 m/s
Area-P 1/2: 3.77 cm2
Calc EF: 52.1 %
MV VTI: 3.05 cm2
S' Lateral: 1.7 cm
Single Plane A2C EF: 57.3 %
Single Plane A4C EF: 46.2 %

## 2024-07-19 LAB — RENAL FUNCTION PANEL
Albumin: 3.2 g/dL — ABNORMAL LOW (ref 3.5–5.0)
Anion gap: 9 (ref 5–15)
BUN: 25 mg/dL — ABNORMAL HIGH (ref 8–23)
CO2: 23 mmol/L (ref 22–32)
Calcium: 9.1 mg/dL (ref 8.9–10.3)
Chloride: 105 mmol/L (ref 98–111)
Creatinine, Ser: 0.96 mg/dL (ref 0.44–1.00)
GFR, Estimated: 56 mL/min — ABNORMAL LOW (ref >60)
Glucose, Bld: 96 mg/dL (ref 70–99)
Phosphorus: 2.1 mg/dL — ABNORMAL LOW (ref 2.5–4.6)
Potassium: 4.3 mmol/L (ref 3.5–5.1)
Sodium: 137 mmol/L (ref 135–145)

## 2024-07-19 LAB — CK: Total CK: 87 U/L (ref 38–234)

## 2024-07-19 LAB — MAGNESIUM: Magnesium: 1.7 mg/dL (ref 1.7–2.4)

## 2024-07-19 MED ORDER — MAGNESIUM SULFATE 2 GM/50ML IV SOLN
2.0000 g | Freq: Once | INTRAVENOUS | Status: AC
  Administered 2024-07-19: 2 g via INTRAVENOUS
  Filled 2024-07-19: qty 50

## 2024-07-19 MED ORDER — LIDOCAINE 5 % EX PTCH: 1.0000 | MEDICATED_PATCH | CUTANEOUS | 0 refills | Status: DC

## 2024-07-19 MED ORDER — K PHOS MONO-SOD PHOS DI & MONO 155-852-130 MG PO TABS
500.0000 mg | ORAL_TABLET | Freq: Two times a day (BID) | ORAL | Status: AC
  Administered 2024-07-19: 500 mg via ORAL
  Filled 2024-07-19: qty 2

## 2024-07-19 MED ORDER — AMLODIPINE BESYLATE 5 MG PO TABS: 5.0000 mg | ORAL_TABLET | Freq: Every day | ORAL | Status: DC

## 2024-07-19 MED ORDER — AMLODIPINE BESYLATE 5 MG PO TABS: 5.0000 mg | ORAL_TABLET | Freq: Every day | ORAL | 1 refills | Status: DC

## 2024-07-19 MED ORDER — SODIUM PHOSPHATES 45 MMOLE/15ML IV SOLN
30.0000 mmol | Freq: Once | INTRAVENOUS | Status: DC
  Filled 2024-07-19: qty 10

## 2024-07-19 NOTE — Plan of Care (Signed)
   Problem: Health Behavior/Discharge Planning: Goal: Ability to manage health-related needs will improve Outcome: Progressing   Problem: Clinical Measurements: Goal: Will remain free from infection Outcome: Progressing Goal: Cardiovascular complication will be avoided Outcome: Progressing   Problem: Nutrition: Goal: Adequate nutrition will be maintained Outcome: Progressing

## 2024-07-19 NOTE — Plan of Care (Signed)

## 2024-07-19 NOTE — Discharge Summary (Signed)
 Physician Discharge Summary   [name] MRN: [id number] DOB: [birth year]  Admit date:     07/17/2024  Discharge date: 07/19/24  Discharge Physician: Amaryllis Dare   PCP: Glendia Shad, MD   Recommendations at discharge:  Please obtain CBC and BMP on follow-up We discontinued home Maxide as he appears dry, providers can restart if needed.  Home amlodipine  dose was increased to 5 mg daily. Follow-up with primary care provider  Discharge Diagnoses: Principal Problem:   Rhabdomyolysis Active Problems:   Fall at home, initial encounter   Leukocytosis   Myocardial injury   Hypokalemia   Acute renal failure superimposed on stage 3a chronic kidney disease (HCC)   HTN (hypertension)   HLD (hyperlipidemia)   Right shoulder pain   Depression with anxiety   Elevated troponin   Hospital Course: Partly taken from H&P.  Raesha L Nicklaus is a 89 y.o. female with medical history significant of HTN, HLD, CKD-3a, depression with anxiety, dementia, who presents with fall.  Patient fell last night and was found on the floor by family member on the morning of admission.  She was complaining of pain in the right shoulder.  On presentation hemodynamically stable, labs with leukocytosis at 22.8, potassium 2.9, CK1341, UA negative, worsening renal function, troponin 121>> 94. Chest x-ray negative.  CT head and cervical spine was negative for any acute injury, did show degenerative disc disease.  MRI of brain was negative for stroke.  X-ray of the right shoulder was negative for any bony fracture.  Patient was given some IV fluid, PT and OT evaluation ordered.  12/1: Vital stable, CK improved to 396, worsening leukocytosis now at 23.5, potassium at 2.8, some improvement in creatinine to 1.13.  Troponin 114. Echocardiogram ordered.  PT is recommending home health if there is a 24/7 supervision available.  Apparently does have private caregivers.  12/2: Blood pressure mildly  elevated, we increased the home dose of amlodipine  to 5 mg daily and discontinued Maxide as he appears dry.  Echocardiogram with normal EF, grade 1 diastolic dysfunction and no other significant abnormality. Improving leukocytosis which was likely reactive.  Renal function has been normalized.  Mild hypophosphatemia which was repleted.  Magnesium at 1.7-received 2 g of magnesium before discharge.  Patient will continue the rest of her home medications and follow-up with her providers for further assistance.  Assessment and Plan: * Rhabdomyolysis Likely secondary to fall and staying on floor overnight.  CK improved to 396. - Giving some more gentle IV fluid -Continue to monitor  Fall at home, initial encounter No acute injuries on imaging.  CT head and MRI brain negative for any acute abnormality. - PT is recommending 24/7 supervision and home health  Leukocytosis Improving leukocytosis now, likely reactive.  Patient did had a diarrhea prior to this admission. Rest of the infectious workup negative so far.  Did receive 1 dose of cefepime and vancomycin in ED and antibiotics were not continued. - CT abdomen and pelvis was negative for any acute abnormality.  Myocardial injury Troponin 121>94>110>114.  No chest pain, likely demand ischemia. - Echocardiogram with normal EF, grade 1 diastolic dysfunction and no other significant abnormality -Continue home aspirin  and Lipitor  Hypokalemia Resolved.  Acute renal failure superimposed on stage 3a chronic kidney disease (HCC) Resolved with IV fluid  HTN (hypertension) Blood pressure is within goal. - Continue holding HCTZ, Maxide-discontinued on discharge as she appears dry -Continue with amlodipine -dose was increased from 2.5 to 5 mg daily  HLD (  hyperlipidemia) - Continue Lipitor  Right shoulder pain X-ray negative for fracture.  Likely due to history of arthritis - As needed Percocet, Tylenol  - Lidoderm  patch  Depression with  anxiety - Continue home Zoloft  and as needed Xanax   Consultants: None Procedures performed: None Disposition: Home health Diet recommendation:  Discharge Diet Orders (From admission, onward)     Start     Ordered   07/19/24 0000  Diet - low sodium heart healthy        07/19/24 1040           Cardiac diet DISCHARGE MEDICATION: Allergies as of 07/19/2024       Reactions   Clindamycin /lincomycin    Egg Protein (egg White)    Egg Protein-containing Drug Products    Flagyl  [metronidazole ]    Levaquin [levofloxacin]    Mobic  [meloxicam ]    GI issues    Penicillins    Sulfa Antibiotics         Medication List     STOP taking these medications    ALPRAZolam  0.25 MG tablet Commonly known as: XANAX    hydrochlorothiazide  25 MG tablet Commonly known as: HYDRODIURIL    predniSONE  5 MG tablet Commonly known as: DELTASONE    triamterene -hydrochlorothiazide  37.5-25 MG tablet Commonly known as: MAXZIDE -25   zolpidem  5 MG tablet Commonly known as: Ambien        TAKE these medications    acetaminophen  500 MG tablet Commonly known as: TYLENOL  Take 1,000 mg by mouth every 8 (eight) hours as needed for mild pain or moderate pain.   amLODipine  5 MG tablet Commonly known as: NORVASC  Take 1 tablet (5 mg total) by mouth daily. Start taking on: July 20, 2024 What changed:  medication strength how much to take   aspirin  EC 81 MG tablet Take 81 mg by mouth daily. Swallow whole.   atorvastatin  10 MG tablet Commonly known as: LIPITOR Take 1 tablet (10 mg total) by mouth daily.   cholecalciferol  1000 units tablet Commonly known as: VITAMIN D  Take 1,000 Units by mouth every other day.   cyanocobalamin  1000 MCG tablet Commonly known as: VITAMIN B12 Take 1,000 mcg by mouth daily.   lidocaine  5 % Commonly known as: LIDODERM  Place 1 patch onto the skin daily. Remove & Discard patch within 12 hours or as directed by MD   sertraline  25 MG tablet Commonly known as:  ZOLOFT  Take 1 tablet (25 mg total) by mouth daily.   traMADol 50 MG tablet Commonly known as: ULTRAM Take 50 mg by mouth.        Follow-up Information     Glendia Shad, MD Follow up.   Specialty: Internal Medicine Why: hospital follow up Contact information: [address] [place] [address] [phone]                Discharge Exam: There were no vitals filed for this visit. General.  Frail and malnourished elderly lady, in no acute distress. Pulmonary.  Lungs clear bilaterally, normal respiratory effort. CV.  Regular rate and rhythm, no JVD, rub or murmur. Abdomen.  Soft, nontender, nondistended, BS positive. CNS.  Alert and oriented .  No focal neurologic deficit. Extremities.  No edema, no cyanosis, pulses intact and symmetrical.  Condition at discharge: stable  The results of significant diagnostics from this hospitalization (including imaging, microbiology, ancillary and laboratory) are listed below for reference.   Imaging Studies: ECHOCARDIOGRAM COMPLETE Result Date: 07/19/2024    ECHOCARDIOGRAM REPORT   Patient Name:   Nolah L  Sheehy Date of Exam: 07/18/2024 Medical Rec #:  [id number]         Height:       62.0 in Accession #:    [id number]        Weight:       110.8 lb Date of Birth:  March 26, 1933        BSA:          1.487 m Patient Age:    89 years          BP:           136/65 mmHg Patient Gender: F                 HR:           76 bpm. Exam Location:  [facility] Procedure: 2D Echo, Cardiac Doppler and Color Doppler (Both Spectral and Color            Flow Doppler were utilized during procedure). Indications:     Elevated Troponin  History:         Patient has no prior history of Echocardiogram examinations.                  Prior CABG.  Sonographer:     Ashley McNeely-Sloane Referring Phys:  [id number] Isaia Hassell Diagnosing Phys: Lonni Hanson MD IMPRESSIONS  1. Left ventricular ejection fraction, by estimation, is 60 to 65%. The left  ventricle has normal function. The left ventricle has no regional wall motion abnormalities. Left ventricular diastolic parameters are consistent with Grade I diastolic dysfunction (impaired relaxation).  2. Right ventricular systolic function is normal. The right ventricular size is normal.  3. Left atrial size was mildly dilated.  4. The mitral valve is degenerative. Trivial mitral valve regurgitation. No evidence of mitral stenosis.  5. The aortic valve is bicuspid. There is mild calcification of the aortic valve. There is mild thickening of the aortic valve. Aortic valve regurgitation is not visualized. Aortic valve sclerosis is present, with no evidence of aortic valve stenosis.  6. The inferior vena cava is normal in size with <50% respiratory variability, suggesting right atrial pressure of 8 mmHg. FINDINGS  Left Ventricle: Left ventricular ejection fraction, by estimation, is 60 to 65%. The left ventricle has normal function. The left ventricle has no regional wall motion abnormalities. The left ventricular internal cavity size was normal in size. There is  no left ventricular hypertrophy. Left ventricular diastolic parameters are consistent with Grade I diastolic dysfunction (impaired relaxation). Right Ventricle: The right ventricular size is normal. No increase in right ventricular wall thickness. Right ventricular systolic function is normal. Left Atrium: Left atrial size was mildly dilated. Right Atrium: Right atrial size was normal in size. Pericardium: There is no evidence of pericardial effusion. Mitral Valve: The mitral valve is degenerative in appearance. There is mild thickening of the mitral valve leaflet(s). Mild mitral annular calcification. Trivial mitral valve regurgitation. No evidence of mitral valve stenosis. MV peak gradient, 5.7 mmHg. The mean mitral valve gradient is 3.0 mmHg. Tricuspid Valve: The tricuspid valve is normal in structure. Tricuspid valve regurgitation is not demonstrated.  Aortic Valve: The aortic valve is bicuspid. There is mild calcification of the aortic valve. There is mild thickening of the aortic valve. Aortic valve regurgitation is not visualized. Aortic valve sclerosis is present, with no evidence of aortic valve stenosis. Aortic valve mean gradient measures 4.0 mmHg. Aortic valve peak gradient measures 8.4 mmHg. Aortic valve area, by VTI measures 2.11  cm. Pulmonic Valve: The pulmonic valve was not well visualized. Pulmonic valve regurgitation is not visualized. No evidence of pulmonic stenosis. Aorta: The aortic root is normal in size and structure. Pulmonary Artery: The pulmonary artery is not well seen. Venous: The inferior vena cava is normal in size with less than 50% respiratory variability, suggesting right atrial pressure of 8 mmHg. IAS/Shunts: The interatrial septum was not well visualized.  LEFT VENTRICLE PLAX 2D LVIDd:         3.20 cm     Diastology LVIDs:         1.70 cm     LV e' medial:    6.31 cm/s LV PW:         0.90 cm     LV E/e' medial:  12.7 LV IVS:        0.80 cm     LV e' lateral:   5.87 cm/s LVOT diam:     1.70 cm     LV E/e' lateral: 13.7 LV SV:         64 LV SV Index:   43 LVOT Area:     2.27 cm LV IVRT:       67 msec  LV Volumes (MOD) LV vol d, MOD A2C: 52.7 ml LV vol d, MOD A4C: 38.1 ml LV vol s, MOD A2C: 22.5 ml LV vol s, MOD A4C: 20.5 ml LV SV MOD A2C:     30.2 ml LV SV MOD A4C:     38.1 ml LV SV MOD BP:      24.0 ml RIGHT VENTRICLE             IVC RV Basal diam:  3.40 cm     IVC diam: 1.80 cm RV Mid diam:    2.60 cm RV S prime:     11.30 cm/s TAPSE (M-mode): 2.0 cm LEFT ATRIUM             Index        RIGHT ATRIUM          Index LA diam:        2.90 cm 1.95 cm/m   RA Area:     9.63 cm LA Vol (A2C):   32.3 ml 21.72 ml/m  RA Volume:   18.20 ml 12.24 ml/m LA Vol (A4C):   53.6 ml 36.04 ml/m LA Biplane Vol: 41.7 ml 28.04 ml/m  AORTIC VALVE                     PULMONIC VALVE AV Area (Vmax):    2.35 cm      PV Vmax:        0.96 m/s AV Area  (Vmean):   2.52 cm      PV Vmean:       67.000 cm/s AV Area (VTI):     2.11 cm      PV VTI:         0.176 m AV Vmax:           145.00 cm/s   PV Peak grad:   3.6 mmHg AV Vmean:          98.200 cm/s   PV Mean grad:   2.0 mmHg AV VTI:            0.303 m       RVOT Peak grad: 2 mmHg AV Peak Grad:      8.4 mmHg AV Mean Grad:      4.0  mmHg LVOT Vmax:         150.00 cm/s LVOT Vmean:        109.000 cm/s LVOT VTI:          0.282 m LVOT/AV VTI ratio: 0.93  AORTA Ao Root diam: 2.90 cm MITRAL VALVE MV Area (PHT): 3.77 cm     SHUNTS MV Area VTI:   3.05 cm     Systemic VTI:  0.28 m MV Peak grad:  5.7 mmHg     Systemic Diam: 1.70 cm MV Mean grad:  3.0 mmHg     Pulmonic VTI:  0.142 m MV Vmax:       1.19 m/s MV Vmean:      77.8 cm/s MV Decel Time: 201 msec MV E velocity: 80.20 cm/s MV A velocity: 110.00 cm/s MV E/A ratio:  0.73 Christopher End MD Electronically signed by Lonni Hanson MD Signature Date/Time: 07/19/2024/10:43:13 AM    Final    CT ABDOMEN PELVIS WO CONTRAST Result Date: 07/18/2024 EXAM: CT ABDOMEN AND PELVIS WITHOUT CONTRAST 07/18/2024 01:11:43 PM TECHNIQUE: CT of the abdomen and pelvis was performed without the administration of intravenous contrast. Multiplanar reformatted images are provided for review. Automated exposure control, iterative reconstruction, and/or weight-based adjustment of the mA/kV was utilized to reduce the radiation dose to as low as reasonably achievable. COMPARISON: CT 01/09/2018 and 05/24/2015. CLINICAL HISTORY: Abdominal pain, acute, nonlocalized. FINDINGS: LOWER CHEST: Median sternotomy wires are present. Visualized lung bases are clear. Borderline cardiomegaly. LIVER: The liver is unremarkable. GALLBLADDER AND BILE DUCTS: Previous cholecystectomy. No biliary ductal dilatation. SPLEEN: No acute abnormality. PANCREAS: No acute abnormality. ADRENAL GLANDS: No acute abnormality. KIDNEYS, URETERS AND BLADDER: Distal ureters and portions of the bladder are obscured by moderate streak  artifact from bilateral hip prostheses. No stones in the kidneys or ureters. No hydronephrosis. No perinephric or periureteral stranding. GI AND BOWEL: Stomach demonstrates no acute abnormality. Moderate diverticulosis of the colon most prominent over the sigmoid colon without evidence of active inflammation or adjacent free fluid. There is no bowel obstruction. PERITONEUM AND RETROPERITONEUM: No ascites. No free air. VASCULATURE: Mild to moderate calcified plaque of the abdominal aorta which is normal in caliber. LYMPH NODES: No lymphadenopathy. REPRODUCTIVE ORGANS: No acute abnormality. BONES AND SOFT TISSUES: Moderate degenerative changes of the spine with multilevel disc disease throughout the lumbar spine. Mild to moderate compression fracture of T11, age indeterminate but new since 2019. Posterior fusion hardware intact at the L4-L5 level. Remaining bony structures unchanged. No focal soft tissue abnormality. IMPRESSION: 1. No acute findings in the abdomen or pelvis. 2. Moderate diverticulosis of the colon without evidence of active inflammation or adjacent free fluid. 3. Age-indeterminate, but new since 2019 mild to moderate T11 compression fracture. Electronically signed by: Toribio Agreste MD 07/18/2024 05:12 PM EST RP Workstation: [id number]   MR BRAIN WO CONTRAST Result Date: 07/17/2024 CLINICAL DATA:  Initial evaluation for acute slurred speech. EXAM: MRI HEAD WITHOUT CONTRAST TECHNIQUE: Multiplanar, multiecho pulse sequences of the brain and surrounding structures were obtained without intravenous contrast. COMPARISON:  CT from earlier the same day. FINDINGS: Brain: Examination somewhat degraded by motion artifact. Diffuse prominence of the CSF containing spaces compatible generalized cerebral atrophy. Patchy and confluent T2/FLAIR hyperintensity involving the periventricular deep white matter both cerebral hemispheres as well as the pons, consistent with chronic small vessel ischemic disease, moderate  in nature. No abnormal foci of restricted diffusion to suggest acute or subacute ischemia. Gray-white matter differentiation maintained. No areas of chronic cortical infarction. No  acute intracranial hemorrhage. Multiple scattered chronic micro hemorrhages noted, most pronounced about the deep gray nuclei, consistent with chronic poorly controlled hypertension. No mass lesion, midline shift or mass effect. Ventricular prominence related global parenchymal volume loss without hydrocephalus. No extra-axial fluid collection. Pituitary gland within normal limits. Vascular: Major intracranial vascular flow voids are maintained. Skull and upper cervical spine: Degenerative osteoarthritic changes noted about the skull base. Bone marrow signal intensity within normal limits. No scalp soft tissue abnormality. Sinuses/Orbits: Prior bilateral ocular lens replacement. Paranasal sinuses are largely clear. Moderate left with small right mastoid effusions, likely benign/sterile. Visualized nasopharynx unremarkable. Other: None. IMPRESSION: 1. No acute intracranial abnormality. 2. Age-related cerebral atrophy with moderate chronic microvascular ischemic disease. 3. Multiple scattered chronic micro hemorrhages, most pronounced about the deep gray nuclei, hypertensive in nature. Electronically Signed   By: Morene Hoard M.D.   On: 07/17/2024 19:37   DG Chest Portable 1 View Result Date: 07/17/2024 CLINICAL DATA:  Found on floor this morning. Patient does not remember the fall. Complaining of right arm pain. EXAM: PORTABLE CHEST 1 VIEW COMPARISON:  09/17/2021 and older exams. FINDINGS: Cardiac silhouette is normal in size. No mediastinal or hilar masses. Lungs are clear.  No pleural effusion or pneumothorax. Skeletal structures are demineralized, grossly intact. IMPRESSION: No active disease. Electronically Signed   By: Alm Parkins M.D.   On: 07/17/2024 18:51   DG Shoulder Right Result Date: 07/17/2024 CLINICAL DATA:   Fall.  Right shoulder pain. EXAM: RIGHT SHOULDER - 2+ VIEW COMPARISON:  None Available. FINDINGS: No fracture. Glenohumeral and AC joints are normally aligned. There is ossification/calcification at the [facility] joint consistent with capsular hypertrophy and synovial calcification. Skeletal structures are demineralized. Soft tissues unremarkable. IMPRESSION: 1. No fracture or dislocation. Electronically Signed   By: Alm Parkins M.D.   On: 07/17/2024 16:22   CT Head Wo Contrast Result Date: 07/17/2024 CLINICAL DATA:  Clemens with trauma to the head and neck EXAM: CT HEAD WITHOUT CONTRAST CT CERVICAL SPINE WITHOUT CONTRAST TECHNIQUE: Multidetector CT imaging of the head and cervical spine was performed following the standard protocol without intravenous contrast. Multiplanar CT image reconstructions of the cervical spine were also generated. RADIATION DOSE REDUCTION: This exam was performed according to the departmental dose-optimization program which includes automated exposure control, adjustment of the mA and/or kV according to patient size and/or use of iterative reconstruction technique. COMPARISON:  None Available. FINDINGS: CT HEAD FINDINGS Brain: Age related volume loss. Chronic small-vessel ischemic changes of the white matter. No sign of acute infarction, mass lesion, hemorrhage, hydrocephalus or extra-axial collection. Vascular: There is atherosclerotic calcification of the major vessels at the base of the brain. Skull: Negative Sinuses/Orbits: Clear/normal Other: None CT CERVICAL SPINE FINDINGS Alignment: No traumatic malalignment. 2 mm degenerative anterolisthesis at C2-3, C6-7 and C7-T1 due to facet degeneration. Skull base and vertebrae: No regional fracture or focal lesion. Soft tissues and spinal canal: No sign of soft tissue injury. Disc levels: Advanced degenerative change at the C1-2 articulation which could relate to craniocervical pain. No apparent central canal compromise. C2-3: Facet degeneration  with 2 mm of anterolisthesis. C3-4: Spondylosis.  No compressive stenosis. C4-5: Spondylosis. Moderate bony canal stenosis due to endplate osteophytes. Bilateral bony foraminal narrowing. C5-6: Spondylosis. Moderate bony canal stenosis due to endplate osteophytes. Bilateral foraminal stenosis. C6-7: Bilateral facet arthropathy with 2 mm of anterolisthesis. No canal stenosis. C7-T1: Bilateral facet arthropathy with 2 mm of anterolisthesis. No canal stenosis. Upper chest: Negative Other: None IMPRESSION: HEAD CT: No  acute or traumatic finding. Age related volume loss and chronic small-vessel ischemic changes of the white matter. CERVICAL SPINE CT: No acute or traumatic finding. Advanced degenerative changes throughout the cervical region as outlined above. Electronically Signed   By: Oneil Officer M.D.   On: 07/17/2024 16:05   CT Cervical Spine Wo Contrast Result Date: 07/17/2024 CLINICAL DATA:  Clemens with trauma to the head and neck EXAM: CT HEAD WITHOUT CONTRAST CT CERVICAL SPINE WITHOUT CONTRAST TECHNIQUE: Multidetector CT imaging of the head and cervical spine was performed following the standard protocol without intravenous contrast. Multiplanar CT image reconstructions of the cervical spine were also generated. RADIATION DOSE REDUCTION: This exam was performed according to the departmental dose-optimization program which includes automated exposure control, adjustment of the mA and/or kV according to patient size and/or use of iterative reconstruction technique. COMPARISON:  None Available. FINDINGS: CT HEAD FINDINGS Brain: Age related volume loss. Chronic small-vessel ischemic changes of the white matter. No sign of acute infarction, mass lesion, hemorrhage, hydrocephalus or extra-axial collection. Vascular: There is atherosclerotic calcification of the major vessels at the base of the brain. Skull: Negative Sinuses/Orbits: Clear/normal Other: None CT CERVICAL SPINE FINDINGS Alignment: No traumatic  malalignment. 2 mm degenerative anterolisthesis at C2-3, C6-7 and C7-T1 due to facet degeneration. Skull base and vertebrae: No regional fracture or focal lesion. Soft tissues and spinal canal: No sign of soft tissue injury. Disc levels: Advanced degenerative change at the C1-2 articulation which could relate to craniocervical pain. No apparent central canal compromise. C2-3: Facet degeneration with 2 mm of anterolisthesis. C3-4: Spondylosis.  No compressive stenosis. C4-5: Spondylosis. Moderate bony canal stenosis due to endplate osteophytes. Bilateral bony foraminal narrowing. C5-6: Spondylosis. Moderate bony canal stenosis due to endplate osteophytes. Bilateral foraminal stenosis. C6-7: Bilateral facet arthropathy with 2 mm of anterolisthesis. No canal stenosis. C7-T1: Bilateral facet arthropathy with 2 mm of anterolisthesis. No canal stenosis. Upper chest: Negative Other: None IMPRESSION: HEAD CT: No acute or traumatic finding. Age related volume loss and chronic small-vessel ischemic changes of the white matter. CERVICAL SPINE CT: No acute or traumatic finding. Advanced degenerative changes throughout the cervical region as outlined above. Electronically Signed   By: Oneil Officer M.D.   On: 07/17/2024 16:05    Microbiology: Results for orders placed or performed during the hospital encounter of 07/17/24  Blood culture (routine x 2)     Status: None (Preliminary result)   Collection Time: 07/17/24  4:36 PM   Specimen: BLOOD  Result Value Ref Range Status   Specimen Description BLOOD BLOOD RIGHT FOREARM  Final   Special Requests   Final    BOTTLES DRAWN AEROBIC AND ANAEROBIC Blood Culture adequate volume   Culture   Final    NO GROWTH 2 DAYS Performed at [facility], [address] [place], [place] [address]    Report Status PENDING  Incomplete  Blood culture (routine x 2)     Status: None (Preliminary result)   Collection Time: 07/17/24  4:41 PM   Specimen: BLOOD  Result Value Ref  Range Status   Specimen Description BLOOD LEFT ANTECUBITAL  Final   Special Requests   Final    BOTTLES DRAWN AEROBIC AND ANAEROBIC Blood Culture adequate volume   Culture   Final    NO GROWTH 2 DAYS Performed at [facility], [address], [place], [place] [address]    Report Status PENDING  Incomplete    Labs: CBC: Recent Labs  Lab 07/17/24 1525 07/18/24  [id number] 07/19/24 0407  WBC 22.8* 23.5* 19.0*  NEUTROABS  --   --  14.9*  HGB 13.2 10.6* 10.0*  HCT 41.3 33.6* 32.0*  MCV 85.9 87.5 88.2  PLT 495* 337 320   Basic Metabolic Panel: Recent Labs  Lab 07/17/24 1525 07/17/24 2212 07/18/24 0052 07/18/24 1031 07/19/24 0407  NA 141  --  137  --  137  K 2.9*  --  2.8*  --  4.3  CL 99  --  102  --  105  CO2 24  --  23  --  23  GLUCOSE 174*  --  105*  --  96  BUN 43*  --  37*  --  25*  CREATININE 1.49*  --  1.13*  --  0.96  CALCIUM  10.0  --  9.1  --  9.1  MG  --  1.9  --  1.8 1.7  PHOS  --  4.2  --   --  2.1*   Liver Function Tests: Recent Labs  Lab 07/19/24 0407  ALBUMIN 3.2*   CBG: No results for input(s): GLUCAP in the last 168 hours.  Discharge time spent: greater than 30 minutes.  This record has been created using Conservation officer, historic buildings. Errors have been sought and corrected,but may not always be located. Such creation errors do not reflect on the standard of care.   Signed: Amaryllis Dare, MD [facility] 07/19/2024

## 2024-07-20 ENCOUNTER — Emergency Department

## 2024-07-20 ENCOUNTER — Inpatient Hospital Stay: Admitting: Certified Registered Nurse Anesthetist

## 2024-07-20 ENCOUNTER — Other Ambulatory Visit: Payer: Self-pay

## 2024-07-20 ENCOUNTER — Inpatient Hospital Stay
Admission: EM | Admit: 2024-07-20 | Discharge: 2024-07-22 | DRG: 378 | Disposition: A | Attending: Internal Medicine | Admitting: Internal Medicine

## 2024-07-20 ENCOUNTER — Encounter: Admission: EM | Disposition: A | Payer: Self-pay | Source: Home / Self Care | Attending: Internal Medicine

## 2024-07-20 ENCOUNTER — Encounter: Payer: Self-pay | Admitting: Internal Medicine

## 2024-07-20 DIAGNOSIS — I739 Peripheral vascular disease, unspecified: Secondary | ICD-10-CM | POA: Diagnosis present

## 2024-07-20 DIAGNOSIS — D72829 Elevated white blood cell count, unspecified: Secondary | ICD-10-CM | POA: Diagnosis present

## 2024-07-20 DIAGNOSIS — I700 Atherosclerosis of aorta: Secondary | ICD-10-CM | POA: Diagnosis not present

## 2024-07-20 DIAGNOSIS — F319 Bipolar disorder, unspecified: Secondary | ICD-10-CM | POA: Diagnosis present

## 2024-07-20 DIAGNOSIS — I5A Non-ischemic myocardial injury (non-traumatic): Secondary | ICD-10-CM | POA: Diagnosis present

## 2024-07-20 DIAGNOSIS — M6282 Rhabdomyolysis: Secondary | ICD-10-CM | POA: Diagnosis present

## 2024-07-20 DIAGNOSIS — F03B3 Unspecified dementia, moderate, with mood disturbance: Secondary | ICD-10-CM | POA: Diagnosis present

## 2024-07-20 DIAGNOSIS — R531 Weakness: Secondary | ICD-10-CM | POA: Diagnosis not present

## 2024-07-20 DIAGNOSIS — R739 Hyperglycemia, unspecified: Secondary | ICD-10-CM | POA: Diagnosis not present

## 2024-07-20 DIAGNOSIS — W19XXXA Unspecified fall, initial encounter: Secondary | ICD-10-CM | POA: Diagnosis present

## 2024-07-20 DIAGNOSIS — E860 Dehydration: Secondary | ICD-10-CM | POA: Diagnosis present

## 2024-07-20 DIAGNOSIS — K922 Gastrointestinal hemorrhage, unspecified: Principal | ICD-10-CM

## 2024-07-20 DIAGNOSIS — R000 Tachycardia, unspecified: Secondary | ICD-10-CM | POA: Diagnosis not present

## 2024-07-20 DIAGNOSIS — N179 Acute kidney failure, unspecified: Secondary | ICD-10-CM | POA: Diagnosis present

## 2024-07-20 DIAGNOSIS — Z1152 Encounter for screening for COVID-19: Secondary | ICD-10-CM | POA: Diagnosis not present

## 2024-07-20 DIAGNOSIS — R627 Adult failure to thrive: Secondary | ICD-10-CM | POA: Diagnosis present

## 2024-07-20 DIAGNOSIS — K254 Chronic or unspecified gastric ulcer with hemorrhage: Secondary | ICD-10-CM | POA: Diagnosis present

## 2024-07-20 DIAGNOSIS — M25511 Pain in right shoulder: Secondary | ICD-10-CM | POA: Diagnosis present

## 2024-07-20 DIAGNOSIS — D62 Acute posthemorrhagic anemia: Principal | ICD-10-CM | POA: Diagnosis present

## 2024-07-20 DIAGNOSIS — N183 Chronic kidney disease, stage 3 unspecified: Secondary | ICD-10-CM | POA: Diagnosis present

## 2024-07-20 DIAGNOSIS — R0689 Other abnormalities of breathing: Secondary | ICD-10-CM | POA: Diagnosis not present

## 2024-07-20 DIAGNOSIS — K259 Gastric ulcer, unspecified as acute or chronic, without hemorrhage or perforation: Secondary | ICD-10-CM | POA: Diagnosis not present

## 2024-07-20 DIAGNOSIS — T68XXXA Hypothermia, initial encounter: Secondary | ICD-10-CM | POA: Diagnosis not present

## 2024-07-20 DIAGNOSIS — E872 Acidosis, unspecified: Secondary | ICD-10-CM | POA: Diagnosis present

## 2024-07-20 DIAGNOSIS — R680 Hypothermia, not associated with low environmental temperature: Secondary | ICD-10-CM | POA: Diagnosis present

## 2024-07-20 DIAGNOSIS — I480 Paroxysmal atrial fibrillation: Secondary | ICD-10-CM | POA: Diagnosis present

## 2024-07-20 DIAGNOSIS — K269 Duodenal ulcer, unspecified as acute or chronic, without hemorrhage or perforation: Secondary | ICD-10-CM | POA: Diagnosis not present

## 2024-07-20 DIAGNOSIS — K921 Melena: Secondary | ICD-10-CM

## 2024-07-20 DIAGNOSIS — K551 Chronic vascular disorders of intestine: Secondary | ICD-10-CM | POA: Diagnosis not present

## 2024-07-20 DIAGNOSIS — K31811 Angiodysplasia of stomach and duodenum with bleeding: Secondary | ICD-10-CM

## 2024-07-20 DIAGNOSIS — K253 Acute gastric ulcer without hemorrhage or perforation: Secondary | ICD-10-CM | POA: Diagnosis not present

## 2024-07-20 DIAGNOSIS — K31819 Angiodysplasia of stomach and duodenum without bleeding: Secondary | ICD-10-CM | POA: Diagnosis not present

## 2024-07-20 DIAGNOSIS — E1122 Type 2 diabetes mellitus with diabetic chronic kidney disease: Secondary | ICD-10-CM | POA: Diagnosis present

## 2024-07-20 DIAGNOSIS — E782 Mixed hyperlipidemia: Secondary | ICD-10-CM | POA: Diagnosis present

## 2024-07-20 DIAGNOSIS — I129 Hypertensive chronic kidney disease with stage 1 through stage 4 chronic kidney disease, or unspecified chronic kidney disease: Secondary | ICD-10-CM | POA: Diagnosis not present

## 2024-07-20 DIAGNOSIS — Z794 Long term (current) use of insulin: Secondary | ICD-10-CM | POA: Diagnosis not present

## 2024-07-20 DIAGNOSIS — I10 Essential (primary) hypertension: Secondary | ICD-10-CM | POA: Diagnosis present

## 2024-07-20 DIAGNOSIS — I2489 Other forms of acute ischemic heart disease: Secondary | ICD-10-CM | POA: Diagnosis present

## 2024-07-20 DIAGNOSIS — Z0389 Encounter for observation for other suspected diseases and conditions ruled out: Secondary | ICD-10-CM | POA: Diagnosis not present

## 2024-07-20 DIAGNOSIS — I2510 Atherosclerotic heart disease of native coronary artery without angina pectoris: Secondary | ICD-10-CM | POA: Diagnosis not present

## 2024-07-20 DIAGNOSIS — F0390 Unspecified dementia without behavioral disturbance: Secondary | ICD-10-CM

## 2024-07-20 DIAGNOSIS — F03B4 Unspecified dementia, moderate, with anxiety: Secondary | ICD-10-CM | POA: Diagnosis present

## 2024-07-20 DIAGNOSIS — N1831 Chronic kidney disease, stage 3a: Secondary | ICD-10-CM | POA: Diagnosis not present

## 2024-07-20 DIAGNOSIS — I701 Atherosclerosis of renal artery: Secondary | ICD-10-CM | POA: Diagnosis not present

## 2024-07-20 DIAGNOSIS — E1151 Type 2 diabetes mellitus with diabetic peripheral angiopathy without gangrene: Secondary | ICD-10-CM | POA: Diagnosis present

## 2024-07-20 HISTORY — PX: HOT HEMOSTASIS: SHX5433

## 2024-07-20 HISTORY — PX: HEMOSTASIS CLIP PLACEMENT: SHX6857

## 2024-07-20 HISTORY — PX: ESOPHAGOGASTRODUODENOSCOPY: SHX5428

## 2024-07-20 LAB — URINALYSIS, W/ REFLEX TO CULTURE (INFECTION SUSPECTED)
Bilirubin Urine: NEGATIVE
Glucose, UA: 50 mg/dL — AB
Hgb urine dipstick: NEGATIVE
Ketones, ur: NEGATIVE mg/dL
Leukocytes,Ua: NEGATIVE
Nitrite: NEGATIVE
Protein, ur: NEGATIVE mg/dL
Specific Gravity, Urine: 1.018 (ref 1.005–1.030)
Squamous Epithelial / HPF: 0 /HPF (ref 0–5)
pH: 5 (ref 5.0–8.0)

## 2024-07-20 LAB — CBC WITH DIFFERENTIAL/PLATELET
Abs Immature Granulocytes: 0.37 K/uL — ABNORMAL HIGH (ref 0.00–0.07)
Basophils Absolute: 0.1 K/uL (ref 0.0–0.1)
Basophils Relative: 0 %
Eosinophils Absolute: 0.1 K/uL (ref 0.0–0.5)
Eosinophils Relative: 0 %
HCT: 25.7 % — ABNORMAL LOW (ref 36.0–46.0)
Hemoglobin: 7.6 g/dL — ABNORMAL LOW (ref 12.0–15.0)
Immature Granulocytes: 2 %
Lymphocytes Relative: 8 %
Lymphs Abs: 1.6 K/uL (ref 0.7–4.0)
MCH: 27.6 pg (ref 26.0–34.0)
MCHC: 29.6 g/dL — ABNORMAL LOW (ref 30.0–36.0)
MCV: 93.5 fL (ref 80.0–100.0)
Monocytes Absolute: 0.4 K/uL (ref 0.1–1.0)
Monocytes Relative: 2 %
Neutro Abs: 18.6 K/uL — ABNORMAL HIGH (ref 1.7–7.7)
Neutrophils Relative %: 88 %
Platelets: 337 K/uL (ref 150–400)
RBC: 2.75 MIL/uL — ABNORMAL LOW (ref 3.87–5.11)
RDW: 13.9 % (ref 11.5–15.5)
WBC: 21.2 K/uL — ABNORMAL HIGH (ref 4.0–10.5)
nRBC: 0 % (ref 0.0–0.2)

## 2024-07-20 LAB — RESP PANEL BY RT-PCR (RSV, FLU A&B, COVID)  RVPGX2
Influenza A by PCR: NEGATIVE
Influenza B by PCR: NEGATIVE
Resp Syncytial Virus by PCR: NEGATIVE
SARS Coronavirus 2 by RT PCR: NEGATIVE

## 2024-07-20 LAB — COMPREHENSIVE METABOLIC PANEL WITH GFR
ALT: 31 U/L (ref 0–44)
AST: 39 U/L (ref 15–41)
Albumin: 3 g/dL — ABNORMAL LOW (ref 3.5–5.0)
Alkaline Phosphatase: 74 U/L (ref 38–126)
Anion gap: 22 — ABNORMAL HIGH (ref 5–15)
BUN: 82 mg/dL — ABNORMAL HIGH (ref 8–23)
CO2: 13 mmol/L — ABNORMAL LOW (ref 22–32)
Calcium: 8.8 mg/dL — ABNORMAL LOW (ref 8.9–10.3)
Chloride: 105 mmol/L (ref 98–111)
Creatinine, Ser: 1.09 mg/dL — ABNORMAL HIGH (ref 0.44–1.00)
GFR, Estimated: 48 mL/min — ABNORMAL LOW (ref >60)
Glucose, Bld: 272 mg/dL — ABNORMAL HIGH (ref 70–99)
Potassium: 3.7 mmol/L (ref 3.5–5.1)
Sodium: 140 mmol/L (ref 135–145)
Total Bilirubin: 0.4 mg/dL (ref 0.0–1.2)
Total Protein: 4.9 g/dL — ABNORMAL LOW (ref 6.5–8.1)

## 2024-07-20 LAB — TROPONIN T, HIGH SENSITIVITY
Troponin T High Sensitivity: 73 ng/L — ABNORMAL HIGH (ref 0–19)
Troponin T High Sensitivity: 66 ng/L — ABNORMAL HIGH (ref 0–19)

## 2024-07-20 LAB — PROTIME-INR
INR: 1.1 (ref 0.8–1.2)
Prothrombin Time: 15.1 s (ref 11.4–15.2)

## 2024-07-20 LAB — LACTIC ACID, PLASMA
Lactic Acid, Venous: 3.6 mmol/L (ref 0.5–1.9)
Lactic Acid, Venous: >9 mmol/L (ref 0.5–1.9)
Lactic Acid, Venous: 2.1 mmol/L (ref 0.5–1.9)

## 2024-07-20 LAB — BLOOD GAS, VENOUS
Acid-base deficit: 9.6 mmol/L — ABNORMAL HIGH (ref 0.0–2.0)
Bicarbonate: 17.6 mmol/L — ABNORMAL LOW (ref 20.0–28.0)
O2 Saturation: 23.4 %
Patient temperature: 37
pCO2, Ven: 42 mmHg — ABNORMAL LOW (ref 44–60)
pH, Ven: 7.23 — ABNORMAL LOW (ref 7.25–7.43)

## 2024-07-20 LAB — CK: Total CK: 98 U/L (ref 38–234)

## 2024-07-20 LAB — HEMOGLOBIN AND HEMATOCRIT, BLOOD
HCT: 19.4 % — ABNORMAL LOW (ref 36.0–46.0)
HCT: 23.3 % — ABNORMAL LOW (ref 36.0–46.0)
Hemoglobin: 6.3 g/dL — ABNORMAL LOW (ref 12.0–15.0)
Hemoglobin: 7.5 g/dL — ABNORMAL LOW (ref 12.0–15.0)

## 2024-07-20 LAB — PREPARE RBC (CROSSMATCH)

## 2024-07-20 LAB — MAGNESIUM: Magnesium: 1.8 mg/dL (ref 1.7–2.4)

## 2024-07-20 LAB — MRSA NEXT GEN BY PCR, NASAL: MRSA by PCR Next Gen: NOT DETECTED

## 2024-07-20 LAB — CBG MONITORING, ED: Glucose-Capillary: 90 mg/dL (ref 70–99)

## 2024-07-20 SURGERY — EGD (ESOPHAGOGASTRODUODENOSCOPY)
Anesthesia: General

## 2024-07-20 MED ORDER — LACTATED RINGERS IV BOLUS (SEPSIS)
1000.0000 mL | Freq: Once | INTRAVENOUS | Status: AC
  Administered 2024-07-20: 1000 mL via INTRAVENOUS

## 2024-07-20 MED ORDER — ACETAMINOPHEN 325 MG PO TABS
650.0000 mg | ORAL_TABLET | Freq: Four times a day (QID) | ORAL | Status: DC | PRN
  Administered 2024-07-21 – 2024-07-22 (×3): 650 mg via ORAL
  Filled 2024-07-20 (×3): qty 2

## 2024-07-20 MED ORDER — INSULIN ASPART 100 UNIT/ML IJ SOLN: 0.0000 [IU] | INTRAMUSCULAR | Status: DC

## 2024-07-20 MED ORDER — LACTATED RINGERS IV SOLN: INTRAVENOUS | Status: DC | PRN

## 2024-07-20 MED ORDER — SODIUM CHLORIDE 0.9 % IV SOLN
2.0000 g | Freq: Once | INTRAVENOUS | Status: AC
  Administered 2024-07-20: 2 g via INTRAVENOUS
  Filled 2024-07-20: qty 12.5

## 2024-07-20 MED ORDER — ONDANSETRON HCL 4 MG/2ML IJ SOLN
INTRAMUSCULAR | Status: DC | PRN
  Administered 2024-07-20: 4 mg via INTRAVENOUS

## 2024-07-20 MED ORDER — SUCCINYLCHOLINE CHLORIDE 200 MG/10ML IV SOSY
PREFILLED_SYRINGE | INTRAVENOUS | Status: DC | PRN
  Administered 2024-07-20: 100 mg via INTRAVENOUS

## 2024-07-20 MED ORDER — SENNOSIDES-DOCUSATE SODIUM 8.6-50 MG PO TABS: 1.0000 | ORAL_TABLET | Freq: Every evening | ORAL | Status: DC | PRN

## 2024-07-20 MED ORDER — LIDOCAINE HCL (PF) 2 % IJ SOLN
INTRAMUSCULAR | Status: AC
  Filled 2024-07-20: qty 5

## 2024-07-20 MED ORDER — ACETAMINOPHEN 650 MG RE SUPP: 650.0000 mg | Freq: Four times a day (QID) | RECTAL | Status: DC | PRN

## 2024-07-20 MED ORDER — PROPOFOL 10 MG/ML IV BOLUS
INTRAVENOUS | Status: DC | PRN
  Administered 2024-07-20: 60 mg via INTRAVENOUS

## 2024-07-20 MED ORDER — LIDOCAINE HCL (CARDIAC) PF 100 MG/5ML IV SOSY
PREFILLED_SYRINGE | INTRAVENOUS | Status: DC | PRN
  Administered 2024-07-20: 50 mg via INTRAVENOUS

## 2024-07-20 MED ORDER — PANTOPRAZOLE SODIUM 40 MG IV SOLR
40.0000 mg | Freq: Once | INTRAVENOUS | Status: AC
  Administered 2024-07-20: 40 mg via INTRAVENOUS
  Filled 2024-07-20: qty 10

## 2024-07-20 MED ORDER — CHLORHEXIDINE GLUCONATE CLOTH 2 % EX PADS
6.0000 | MEDICATED_PAD | Freq: Every day | CUTANEOUS | Status: DC
  Administered 2024-07-21: 6 via TOPICAL

## 2024-07-20 MED ORDER — ORAL CARE MOUTH RINSE: 15.0000 mL | OROMUCOSAL | Status: DC | PRN

## 2024-07-20 MED ORDER — IOHEXOL 350 MG/ML SOLN
100.0000 mL | Freq: Once | INTRAVENOUS | Status: AC | PRN
  Administered 2024-07-20: 80 mL via INTRAVENOUS

## 2024-07-20 MED ORDER — SODIUM CHLORIDE 0.9% IV SOLUTION
Freq: Once | INTRAVENOUS | Status: DC
  Filled 2024-07-20: qty 250

## 2024-07-20 MED ORDER — SODIUM CHLORIDE 0.9% FLUSH
3.0000 mL | Freq: Two times a day (BID) | INTRAVENOUS | Status: DC
  Administered 2024-07-20 – 2024-07-22 (×4): 3 mL via INTRAVENOUS

## 2024-07-20 MED ORDER — PROPOFOL 10 MG/ML IV BOLUS
INTRAVENOUS | Status: AC
  Filled 2024-07-20: qty 20

## 2024-07-20 MED ORDER — SUCCINYLCHOLINE CHLORIDE 200 MG/10ML IV SOSY
PREFILLED_SYRINGE | INTRAVENOUS | Status: AC
  Filled 2024-07-20: qty 10

## 2024-07-20 MED ORDER — SODIUM CHLORIDE 0.9 % IV SOLN
500.0000 mg | Freq: Once | INTRAVENOUS | Status: AC
  Administered 2024-07-20: 500 mg via INTRAVENOUS
  Filled 2024-07-20: qty 5

## 2024-07-20 MED ORDER — PANTOPRAZOLE SODIUM 40 MG IV SOLR
40.0000 mg | Freq: Two times a day (BID) | INTRAVENOUS | Status: DC
  Administered 2024-07-20 – 2024-07-22 (×4): 40 mg via INTRAVENOUS
  Filled 2024-07-20 (×4): qty 10

## 2024-07-20 MED ORDER — VANCOMYCIN HCL IN DEXTROSE 1-5 GM/200ML-% IV SOLN
1000.0000 mg | Freq: Once | INTRAVENOUS | Status: AC
  Administered 2024-07-20: 1000 mg via INTRAVENOUS
  Filled 2024-07-20: qty 200

## 2024-07-20 NOTE — ED Notes (Signed)
 Received patient from endo. Pt had empty blood package hanging. Will complete transfusion

## 2024-07-20 NOTE — Anesthesia Procedure Notes (Signed)
 Procedure Name: Intubation Date/Time: 07/20/2024 6:34 PM  Performed by: Lacretia Camelia NOVAK, CRNAPre-anesthesia Checklist: Emergency Drugs available, Patient identified, Suction available and Patient being monitored Patient Re-evaluated:Patient Re-evaluated prior to induction Oxygen Delivery Method: Circle system utilized Preoxygenation: Pre-oxygenation with 100% oxygen Induction Type: IV induction and Rapid sequence Laryngoscope Size: McGrath and 3 Grade View: Grade I Tube type: Oral Tube size: 7.0 mm Number of attempts: 1 Airway Equipment and Method: Video-laryngoscopy and Stylet Placement Confirmation: ETT inserted through vocal cords under direct vision, positive ETCO2 and breath sounds checked- equal and bilateral Secured at: 21 cm Tube secured with: Tape Dental Injury: Teeth and Oropharynx as per pre-operative assessment

## 2024-07-20 NOTE — ED Triage Notes (Signed)
 Pt BIB EMS from home with complaints of breathing difficulty. Per EMS she was 96% RA but very tachyneic. CBG 440. Pt has dementia at baseline.

## 2024-07-20 NOTE — Op Note (Signed)
 [facility] Patient Name: Alicia Mccann Procedure Date: 07/20/2024 5:55 PM MRN: [id number] Account #: [id number] Date of Birth: [birth year] Admit Type: Outpatient Age: 89 Room: [facility] ENDO ROOM 4 Gender: Female Note Status: Finalized Instrument Name: Endoscope [id number] Procedure:             Upper GI endoscopy Indications:           Melena Providers:             Rogelia Copping MD, MD Referring MD:          Allena Hamilton, MD (Referring MD) Medicines:             General Anesthesia Complications:         No immediate complications. Procedure:             Pre-Anesthesia Assessment:                        - Prior to the procedure, a History and Physical was                         performed, and patient medications and allergies were                         reviewed. The patient's tolerance of previous                         anesthesia was also reviewed. The risks and benefits                         of the procedure and the sedation options and risks                         were discussed with the patient. All questions were                         answered, and informed consent was obtained. Prior                         Anticoagulants: The patient has taken no anticoagulant                         or antiplatelet agents. ASA Grade Assessment: III - A                         patient with severe systemic disease. After reviewing                         the risks and benefits, the patient was deemed in                         satisfactory condition to undergo the procedure.                        After obtaining informed consent, the endoscope was                         passed under direct vision. Throughout the procedure,  the patient's blood pressure, pulse, and oxygen                         saturations were monitored continuously. The Endoscope                         was introduced through the mouth, and advanced to the                          second part of duodenum. The upper GI endoscopy was                         accomplished without difficulty. The patient tolerated                         the procedure well. Findings:      The examined esophagus was normal.      One non-bleeding cratered gastric ulcer with adherent clot was found in       the gastric antrum. Coagulation for hemostasis using argon plasma at 2       liters/minute and 20 watts was successful. For location marking, one       hemostatic clip was successfully placed. Clip manufacturer: [employer]. There was no bleeding at the end of the procedure.      Two angiodysplastic lesions with bleeding were found in the second       portion of the duodenum. Coagulation for hemostasis using argon plasma       at 2 liters/minute and 20 watts was successful.      One non-bleeding cratered duodenal ulcer with no stigmata of bleeding       was found in the second portion of the duodenum. Impression:            - Normal esophagus.                        - Non-bleeding gastric ulcer with adherent clot.                         Treated with argon plasma coagulation (APC). Clip was                         placed. Clip manufacturer: [employer].                        - Two bleeding angiodysplastic lesions in the                         duodenum. Treated with argon plasma coagulation (APC).                        - Non-bleeding duodenal ulcer with no stigmata of                         bleeding.                        - No specimens collected. Recommendation:        - Return patient to hospital ward for ongoing care.                        -  Clear liquid diet.                        - Continue present medications.                        - If any furtrher sign of bleeding then should have                         embolization by vascular surgery. Procedure Code(s):     --- Professional ---                        [phone],  Esophagogastroduodenoscopy, flexible,                         transoral; with control of bleeding, any method                        [phone], Unlisted procedure, stomach Diagnosis Code(s):     --- Professional ---                        K92.1, Melena (includes Hematochezia)                        K31.811, Angiodysplasia of stomach and duodenum with                         bleeding                        K25.4, Chronic or unspecified gastric ulcer with                         hemorrhage                        K26.9, Duodenal ulcer, unspecified as acute or                         chronic, without hemorrhage or perforation CPT copyright 2022 American Medical Association. All rights reserved. The codes documented in this report are preliminary and upon coder review may  be revised to meet current compliance requirements. Rogelia Copping MD, MD 07/20/2024 6:51:54 PM This report has been signed electronically. Number of Addenda: 0 Note Initiated On: 07/20/2024 5:55 PM Estimated Blood Loss:  Estimated blood loss: none.      [facility]

## 2024-07-20 NOTE — ED Notes (Signed)
 Pt soiled with dark stool running down her legs. Full bed linen change including sheet, chuck pad, brief. Peri care provided with warm bath wipes. Clean warm blankets given to patient.

## 2024-07-20 NOTE — ED Provider Notes (Signed)
 [facility] Provider Note    Event Date/Time   First MD Initiated Contact with Patient 07/20/24 1041     (approximate)   History   Breathing Difficulties   HPI  Alicia Mccann is a 89 year old female with history of hypertension, dementia presenting to the emergency department for evaluation of shortness of breath.  With EMS patient was 96% on room air but noted to be tachypneic.  CBG 440.  Patient unable to provide history to me.  Reviewed discharge summary from yesterday.  Patient presented after a fall found to have elevated CK, worsening renal function, leukocytosis.  Negative MRI of the brain.  CK improved and patient discharged.      Physical Exam   Triage Vital Signs: ED Triage Vitals  Encounter Vitals Group     BP      Girls Systolic BP Percentile      Girls Diastolic BP Percentile      Boys Systolic BP Percentile      Boys Diastolic BP Percentile      Pulse      Resp      Temp      Temp src      SpO2      Weight      Height      Head Circumference      Peak Flow      Pain Score      Pain Loc      Pain Education      Exclude from Growth Chart     Most recent vital signs: Vitals:   07/20/24 1103  BP: 136/80  Pulse: (!) 140  Resp: (!) 21  Temp: (!) 94.8 F (34.9 C)  SpO2: 100%     General: Awake, interactive, confused CV:  Good peripheral perfusion Resp:  Unlabored respirations, lungs mildly coarse bilaterally Abd:  Nondistended, soft, no appreciable tenderness to palpation, patient sitting in black stool, Hemoccult positive, external hemorrhoids noted, small area of bright red bleeding Neuro:  No gross facial asymmetry, moving extremity spontaneously    ED Results / Procedures / Treatments   Labs (all labs ordered are listed, but only abnormal results are displayed) Labs Reviewed  LACTIC ACID, PLASMA - Abnormal; Notable for the following components:      Result Value   Lactic Acid, Venous >9.0 (*)    All  other components within normal limits  LACTIC ACID, PLASMA - Abnormal; Notable for the following components:   Lactic Acid, Venous 2.1 (*)    All other components within normal limits  COMPREHENSIVE METABOLIC PANEL WITH GFR - Abnormal; Notable for the following components:   CO2 13 (*)    Glucose, Bld 272 (*)    BUN 82 (*)    Creatinine, Ser 1.09 (*)    Calcium  8.8 (*)    Total Protein 4.9 (*)    Albumin 3.0 (*)    GFR, Estimated 48 (*)    Anion gap 22 (*)    All other components within normal limits  CBC WITH DIFFERENTIAL/PLATELET - Abnormal; Notable for the following components:   WBC 21.2 (*)    RBC 2.75 (*)    Hemoglobin 7.6 (*)    HCT 25.7 (*)    MCHC 29.6 (*)    Neutro Abs 18.6 (*)    Abs Immature Granulocytes 0.37 (*)    All other components within normal limits  URINALYSIS, W/ REFLEX TO CULTURE (INFECTION SUSPECTED) - Abnormal; Notable for the following  components:   Color, Urine STRAW (*)    APPearance CLEAR (*)    Glucose, UA 50 (*)    Bacteria, UA RARE (*)    All other components within normal limits  BLOOD GAS, VENOUS - Abnormal; Notable for the following components:   pH, Ven 7.23 (*)    pCO2, Ven 42 (*)    Bicarbonate 17.6 (*)    Acid-base deficit 9.6 (*)    All other components within normal limits  LACTIC ACID, PLASMA - Abnormal; Notable for the following components:   Lactic Acid, Venous 3.6 (*)    All other components within normal limits  TROPONIN T, HIGH SENSITIVITY - Abnormal; Notable for the following components:   Troponin T High Sensitivity 73 (*)    All other components within normal limits  TROPONIN T, HIGH SENSITIVITY - Abnormal; Notable for the following components:   Troponin T High Sensitivity 66 (*)    All other components within normal limits  RESP PANEL BY RT-PCR (RSV, FLU A&B, COVID)  [id number]  CULTURE, BLOOD (ROUTINE X 2)  CULTURE, BLOOD (ROUTINE X 2)  PROTIME-INR  CK  TYPE AND SCREEN     EKG EKG independently reviewed and  interpreted by myself demonstrates:  EKG demonstrates A-fib at a rate of 144, QRS 102, QTc 405, no acute ST changes  RADIOLOGY Imaging independently reviewed and interpreted by myself demonstrates:  CTA GI bleed protocol without active GI bleed Chest x-[name] without focal consolidation  Formal Radiology Read:  CT ANGIO GI BLEED Result Date: 07/20/2024 EXAM: CTA ABDOMEN AND PELVIS WITH CONTRAST 07/20/2024 12:55:36 PM TECHNIQUE: CTA images of the abdomen and pelvis with 80 mL iohexol (OMNIPAQUE) 350 MG/ML injection 100 mL IOHEXOL 350 MG/ML SOLN intravenous contrast. Three-dimensional MIP/volume rendered formations were performed. Automated exposure control, iterative reconstruction, and/or weight based adjustment of the mA/kV was utilized to reduce the radiation dose to as low as reasonably achievable. COMPARISON: CT abdomen/pelvis 07/18/2024, CT 03/18/2019. CLINICAL HISTORY: GI bleed, significantly elevated lactic, eval ischemia. FINDINGS: VASCULATURE: GI BLEED: No significant enhancement to suggest acute/active GI bleed. AORTA: There is moderate calcified plaque of the abdominal aorta which is normal in caliber. No abdominal aortic aneurysm. No dissection. CELIAC TRUNK: Calcified plaque at the origin of the celiac artery. No occlusion or significant stenosis. SUPERIOR MESENTERIC ARTERY: Calcified plaque at the origin of the superior mesenteric artery. No occlusion or significant stenosis. INFERIOR MESENTERIC ARTERY: No acute finding. No occlusion or significant stenosis. RENAL ARTERIES: Calcified plaque at the origin of the renal arteries which are otherwise patent. No occlusion or significant stenosis. ILIAC ARTERIES: No acute finding. No occlusion or significant stenosis. ABDOMEN/PELVIS: LOWER CHEST: Mild stable cardiomegaly. Calcified plaque over the visualized descending thoracic aorta. Visualized lung bases demonstrate no acute airspace process or effusion. There is a 7 mm nodule over the left lower  lobe (image 3) unchanged from 2020 and therefore considered benign. LIVER: The liver is unremarkable. GALLBLADDER AND BILE DUCTS: Previous cholecystectomy. No biliary ductal dilatation. SPLEEN: The spleen is unremarkable. PANCREAS: The pancreas is unremarkable. ADRENAL GLANDS: Bilateral adrenal glands demonstrate no acute abnormality. KIDNEYS, URETERS AND BLADDER: No stones in the kidneys or ureters. No hydronephrosis. No perinephric or periureteral stranding. Urinary bladder is unremarkable. GI AND BOWEL: There is wall thickening of the gastric antrum without adjacent inflammatory change or fluid. These findings could be seen with gastritis. Moderate diverticulosis of the colon most prominent of the sigmoid colon without definite active inflammation. There is no bowel obstruction. No abnormal  bowel wall thickening or distension. REPRODUCTIVE: Stable simple appearing 2.5 cm right ovarian cyst. PERITONEUM AND RETROPERITONEUM: No ascites or free air. LYMPH NODES: No lymphadenopathy. BONES AND SOFT TISSUES: T11 compression fracture unchanged. Mild loss of anterior vertebral body height of T12 unchanged. Posterior fusion hardware intact over the L4-L5 level with interbody fusion. Bilateral hip prostheses are otherwise unchanged. Note that there is moderate streak artifact over the pelvis due to bilateral hip prostheses. No acute soft tissue abnormality. IMPRESSION: 1. No active GI bleeding. 2. Wall thickening of the gastric antrum without adjacent inflammatory change or fluid as findings may be seen with gastritis. 3. Moderate diverticulosis of the colon, most prominent in the sigmoid colon, without active inflammation. 4. Aortic atherosclerosis. Electronically signed by: Toribio Agreste MD 07/20/2024 01:56 PM EST RP Workstation: [id number]   DG Chest Port 1 View Result Date: 07/20/2024 EXAM: 1 VIEW(S) XRAY OF THE CHEST 07/20/2024 11:20:00 AM COMPARISON: 07/17/2024 CLINICAL HISTORY: 89 year old female. Questionable  sepsis - evaluate for abnormality. FINDINGS: LUNGS AND PLEURA: No focal pulmonary opacity. No pleural effusion. No pneumothorax. HEART AND MEDIASTINUM: Aortic atherosclerosis. No acute abnormality of the cardiac and mediastinal silhouettes. BONES AND SOFT TISSUES: Advanced degenerative changes in left shoulder. No acute osseous abnormality. IMPRESSION: 1. No acute cardiopulmonary abnormality. Electronically signed by: Helayne Hurst MD 07/20/2024 11:58 AM EST RP Workstation: [id number]    PROCEDURES:  Critical Care performed: Yes, see critical care procedure note(s)  CRITICAL CARE Performed by: Nilsa Dade   Total critical care time: 32 minutes  Critical care time was exclusive of separately billable procedures and treating other patients.  Critical care was necessary to treat or prevent imminent or life-threatening deterioration.  Critical care was time spent personally by me on the following activities: development of treatment plan with patient and/or surrogate as well as nursing, discussions with consultants, evaluation of patient's response to treatment, examination of patient, obtaining history from patient or surrogate, ordering and performing treatments and interventions, ordering and review of laboratory studies, ordering and review of radiographic studies, pulse oximetry and re-evaluation of patient's condition.   Procedures   MEDICATIONS ORDERED IN ED: Medications  azithromycin (ZITHROMAX) 500 mg in sodium chloride  0.9 % 250 mL IVPB (500 mg Intravenous New Bag/Given 07/20/24 1332)  lactated ringers  bolus 1,000 mL (0 mLs Intravenous Stopped 07/20/24 1313)  vancomycin (VANCOCIN) IVPB 1000 mg/200 mL premix (0 mg Intravenous Stopped 07/20/24 1336)  ceFEPIme (MAXIPIME) 2 g in sodium chloride  0.9 % 100 mL IVPB (0 g Intravenous Stopped 07/20/24 1236)  pantoprazole (PROTONIX) injection 40 mg (40 mg Intravenous Given 07/20/24 1202)  iohexol (OMNIPAQUE) 350 MG/ML injection 100 mL (80 mLs  Intravenous Contrast Given 07/20/24 1243)     IMPRESSION / MDM / ASSESSMENT AND PLAN / ED COURSE  I reviewed the triage vital signs and the nursing notes.  Differential diagnosis includes, but is not limited to, pneumonia, UTI, bleed, anemia, electrolyte abnormality  Patient's presentation is most consistent with acute presentation with potential threat to life or bodily function.  89 year old female presenting to the emergency department with shortness of breath.  Tachycardic, hypothermic on presentation here.  Placed on [employer]  Sepsis orders initiated with empiric cefepime, vancomycin, azithromycin in the setting of recent IV antibiotics.  Ordered for a liter of IV fluids as well as Protonix given concern for GI bleed. Clinical Course as of 07/20/24 1518  Wed Jul 20, 2024  1215 CBC with significant leukocytosis WC of 21, acute hemoglobin drop of hemoglobin of 7.6.  CMP with anion gap metabolic acidosis, only slightly elevated glucose at 272.  Lactate greater than 9, VBG with metabolic acidosis.  Urine without evidence of infection.  With degree of lactic acidosis I am concerned about possible ischemic bowel.  Ordered for CTA GI bleed protocol. [NR]  1421 CTA without active GI bleed or evidence of mesenteric ischemia.  Findings of gastritis and diverticulosis noted.  Shortly after initial lactate of the 9 resulted, repeat was sent which was elevated but not nearly as elevated at 3.6.  Suspect initial significantly lactate was erroneous.  Given suspicion that the significant lactate elevation was incorrect and concurrent GI bleed, I do think 30 cc/kg of IV fluid resuscitation would be harmful in this patient and I have deferred on further fluids for now.  However, do think patient remains appropriate for admission in the setting of her GI bleed with acute hemoglobin drop.  Will reach out to hospitalist team. [NR]  1427 Notified by RN that patient did have another bowel movement with dark stool.   Will go ahead and order a unit of blood as I suspect her hemoglobin will downtrend. [NR]  1517 Case discussed with hospitalist. He will evaluate for anticipated admission.  [NR]    Clinical Course User Index [NR] Levander Slate, MD     FINAL CLINICAL IMPRESSION(S) / ED DIAGNOSES   Final diagnoses:  Gastrointestinal hemorrhage, unspecified gastrointestinal hemorrhage type  Generalized weakness     Rx / DC Orders   ED Discharge Orders     None        Note:  This document was prepared using Dragon voice recognition software and may include unintentional dictation errors.   Levander Slate, MD 07/20/24 [phone]

## 2024-07-20 NOTE — Progress Notes (Signed)
 CODE SEPSIS - PHARMACY COMMUNICATION  **Broad Spectrum Antibiotics should be administered within 1 hour of Sepsis diagnosis**  Time Code Sepsis Called/Page Received: 10:57  Antibiotics Ordered: Cefepime, vancomycin, azithromycin  Time of 1st antibiotic administration: 12:01  Additional action taken by pharmacy: Messaged RN whether abx were given or not since was not documented in chart yet.   If necessary, Name of Provider/Nurse Contacted: Hildreth Kidney, RN   Ransom Blanch PGY-1 Pharmacy Resident  [place] [facility]  07/20/2024 11:02 AM

## 2024-07-20 NOTE — ED Notes (Signed)
 Pt's rectal temp taken and dark stool noted. Dr Levander at bedside and hemacult done. Hemacult positive. Pt placed on bair hugger due to being hypothermic.

## 2024-07-20 NOTE — Consult Note (Signed)
 Rogelia Copping, MD [facility]  [address], [address] [place], [place] [address] Phone: [phone] Fax : [phone]  Consultation  Referring Provider:     Dr. Morene Bathe Primary Care Physician:  Glendia Shad, MD Primary Gastroenterologist: [facility]         Reason for Consultation:     Upper GI bleed  Date of Admission:  07/20/2024 Date of Consultation:  07/20/2024         HPI:   Alicia Mccann is a 89 y.o. female who was recently discharged from hospital yesterday for elevated CK and worsening renal function.  Patient has a history of dementia hypertension and came to the emergency department because of shortness of breath.  The patient had been noted to have black stools and had a drop in her hemoglobin which appears to have been 10.62 days ago and 10 yesterday with a repeat today around noon of 7.6 with 4 hours later being down to 6.3. The patient's daughter states that the patient only takes a baby aspirin  and has not been on any blood thinners or anti-inflammatory medication. The patient is not able to give much history and just keeps saying that she does not want to go back to the hospital and that she keeps asking for her dog.  The patient had a CT angiography that showed:  IMPRESSION: 1. No active GI bleeding. 2. Wall thickening of the gastric antrum without adjacent inflammatory change or fluid as findings may be seen with gastritis. 3. Moderate diverticulosis of the colon, most prominent in the sigmoid colon, without active inflammation. 4. Aortic atherosclerosis.  There has been no history of the patient having a GI bleed in the past.  Past Medical History:  Diagnosis Date   Allergic rhinitis    Anemia    Anxiety    Cellulitis    Cholelithiasis    Essential hypertension    History of stress test    a. 12/2008 Ex MV: EF 78%, no ischemia.   Insomnia    Osteoarthritis    Tachycardia    Valvular heart disease    a. 12/2008 Echo: EF 65%, mild MR;  b. 2/6 SEM  RUSB - insignificant murmur, prob Ao Sclerosis.    Past Surgical History:  Procedure Laterality Date   BACK SURGERY     BUNIONECTOMY     CATARACT EXTRACTION W/PHACO Right 05/18/2019   Procedure: CATARACT EXTRACTION PHACO AND INTRAOCULAR LENS PLACEMENT (IOC) RIGHT MALYUGIN  01:06.5  20.6%  13.72;  Surgeon: Mittie Gaskin, MD;  Location: [facility];  Service: Ophthalmology;  Laterality: Right;  please leave patient arrival 10   CATARACT EXTRACTION W/PHACO Left 06/08/2019   Procedure: CATARACT EXTRACTION PHACO AND INTRAOCULAR LENS PLACEMENT (IOC) LEFT  00:51.0  20.3%  10.37;  Surgeon: Mittie Gaskin, MD;  Location: [facility];  Service: Ophthalmology;  Laterality: Left;  ARRIVAL 10:30 PLEASE LEAVE   CHOLECYSTECTOMY  03/29/2007   GALLBLADDER SURGERY     HIP SURGERY     bilateral    REPLACEMENT TOTAL KNEE Right    THUMB ARTHROSCOPY      Prior to Admission medications   Medication Sig Start Date End Date Taking? Authorizing Provider  acetaminophen  (TYLENOL ) 500 MG tablet Take 1,000 mg by mouth every 8 (eight) hours as needed for mild pain or moderate pain.   Yes [provider]  amLODipine  (NORVASC ) 5 MG tablet Take 1 tablet (5 mg total) by mouth daily. 07/20/24  Yes Amin,  Sumayya, MD  aspirin  81 MG EC tablet Take 81 mg by mouth daily. Swallow whole.   Yes [provider]  atorvastatin  (LIPITOR) 10 MG tablet Take 1 tablet (10 mg total) by mouth daily. 06/29/24  Yes Gollan, Timothy J, MD  cholecalciferol  (VITAMIN D ) 1000 UNITS tablet Take 1,000 Units by mouth every other day.    Yes [provider]  cyanocobalamin  (VITAMIN B12) 1000 MCG tablet Take 1,000 mcg by mouth daily.   Yes [provider]  lidocaine  (LIDODERM ) 5 % Place 1 patch onto the skin daily. Remove & Discard patch within 12 hours or as directed by MD 07/19/24  Yes Caleen Qualia, MD  sertraline  (ZOLOFT ) 25 MG tablet Take 1 tablet (25 mg total) by mouth daily. 11/26/23   Yes Glendia Shad, MD  traMADol (ULTRAM) 50 MG tablet Take 50 mg by mouth. 07/04/24  Yes [provider]    Family History  Problem Relation Age of Onset   Heart attack Father    Stroke Father    Heart disease Father    CAD Father    Heart attack Son 72       MI   Hypertension Son    Hypertension Mother    Heart disease Mother    Stroke Mother    Breast cancer Neg Hx      Social History   Tobacco Use   Smoking status: Never   Smokeless tobacco: Never  Vaping Use   Vaping status: Never Used  Substance Use Topics   Alcohol use: Yes    Comment: 2 oz QOD   Drug use: No    Allergies as of 07/20/2024 - Review Complete 07/20/2024  Allergen Reaction Noted   Clindamycin /lincomycin  12/09/2016   Egg protein (egg white)  04/30/2015   Egg protein-containing drug products  06/27/2013   Flagyl  [metronidazole ]  12/09/2016   Levaquin [levofloxacin]  05/01/2015   Mobic  [meloxicam ]  05/08/2017   Penicillins  06/27/2013   Sulfa antibiotics  06/27/2013    Review of Systems:    All systems reviewed and negative except where noted in HPI.   Physical Exam:  Vital signs in last 24 hours: Temp:  [94.8 F (34.9 C)-99.1 F (37.3 C)] 97.9 F (36.6 C) (12/03 1650) Pulse Rate:  [102-140] 110 (12/03 1650) Resp:  [19-26] 25 (12/03 1650) BP: (113-136)/(55-80) 124/57 (12/03 1650) SpO2:  [100 %] 100 % (12/03 1429) Weight:  [50.3 kg] 50.3 kg (12/03 1103)   General:   Agitated confused and in no apparent distress Head:  Normocephalic and atraumatic. Eyes:   No icterus.   Conjunctiva pale. PERRLA. Ears:  Normal auditory acuity. Neck:  Supple; no masses or thyroidomegaly Lungs: Respirations even and unlabored. Lungs clear to auscultation bilaterally.   No wheezes, crackles, or rhonchi.  Heart:  Regular rate and rhythm;  Without murmur, clicks, rubs or gallops Abdomen:  Soft, nondistended, nontender. Normal bowel sounds. No appreciable masses or hepatomegaly.  No rebound or  guarding.  Rectal:  Not performed. Msk:  Symmetrical without gross deformities.    Extremities:  Without edema, cyanosis or clubbing. Neurologic:  Alert and clearly demented;  grossly normal neurologically. Skin:  Intact without significant lesions or rashes. Cervical Nodes:  No significant cervical adenopathy. Psych:  Alert and agitated.  Clearly demented.  LAB RESULTS: Recent Labs    07/18/24 0052 07/19/24 0407 07/20/24 1123 07/20/24 1550  WBC 23.5* 19.0* 21.2*  --   HGB 10.6* 10.0* 7.6* 6.3*  HCT 33.6* 32.0* 25.7*  19.4*  PLT 337 320 337  --    BMET Recent Labs    07/18/24 0052 07/19/24 0407 07/20/24 1123  NA 137 137 140  K 2.8* 4.3 3.7  CL 102 105 105  CO2 23 23 13*  GLUCOSE 105* 96 272*  BUN 37* 25* 82*  CREATININE 1.13* 0.96 1.09*  CALCIUM  9.1 9.1 8.8*   LFT Recent Labs    07/20/24 1123  PROT 4.9*  ALBUMIN 3.0*  AST 39  ALT 31  ALKPHOS 74  BILITOT 0.4   PT/INR Recent Labs    07/20/24 1123  LABPROT 15.1  INR 1.1    STUDIES: CT ANGIO GI BLEED Result Date: 07/20/2024 EXAM: CTA ABDOMEN AND PELVIS WITH CONTRAST 07/20/2024 12:55:36 PM TECHNIQUE: CTA images of the abdomen and pelvis with 80 mL iohexol (OMNIPAQUE) 350 MG/ML injection 100 mL IOHEXOL 350 MG/ML SOLN intravenous contrast. Three-dimensional MIP/volume rendered formations were performed. Automated exposure control, iterative reconstruction, and/or weight based adjustment of the mA/kV was utilized to reduce the radiation dose to as low as reasonably achievable. COMPARISON: CT abdomen/pelvis 07/18/2024, CT 03/18/2019. CLINICAL HISTORY: GI bleed, significantly elevated lactic, eval ischemia. FINDINGS: VASCULATURE: GI BLEED: No significant enhancement to suggest acute/active GI bleed. AORTA: There is moderate calcified plaque of the abdominal aorta which is normal in caliber. No abdominal aortic aneurysm. No dissection. CELIAC TRUNK: Calcified plaque at the origin of the celiac artery. No occlusion or  significant stenosis. SUPERIOR MESENTERIC ARTERY: Calcified plaque at the origin of the superior mesenteric artery. No occlusion or significant stenosis. INFERIOR MESENTERIC ARTERY: No acute finding. No occlusion or significant stenosis. RENAL ARTERIES: Calcified plaque at the origin of the renal arteries which are otherwise patent. No occlusion or significant stenosis. ILIAC ARTERIES: No acute finding. No occlusion or significant stenosis. ABDOMEN/PELVIS: LOWER CHEST: Mild stable cardiomegaly. Calcified plaque over the visualized descending thoracic aorta. Visualized lung bases demonstrate no acute airspace process or effusion. There is a 7 mm nodule over the left lower lobe (image 3) unchanged from 2020 and therefore considered benign. LIVER: The liver is unremarkable. GALLBLADDER AND BILE DUCTS: Previous cholecystectomy. No biliary ductal dilatation. SPLEEN: The spleen is unremarkable. PANCREAS: The pancreas is unremarkable. ADRENAL GLANDS: Bilateral adrenal glands demonstrate no acute abnormality. KIDNEYS, URETERS AND BLADDER: No stones in the kidneys or ureters. No hydronephrosis. No perinephric or periureteral stranding. Urinary bladder is unremarkable. GI AND BOWEL: There is wall thickening of the gastric antrum without adjacent inflammatory change or fluid. These findings could be seen with gastritis. Moderate diverticulosis of the colon most prominent of the sigmoid colon without definite active inflammation. There is no bowel obstruction. No abnormal bowel wall thickening or distension. REPRODUCTIVE: Stable simple appearing 2.5 cm right ovarian cyst. PERITONEUM AND RETROPERITONEUM: No ascites or free air. LYMPH NODES: No lymphadenopathy. BONES AND SOFT TISSUES: T11 compression fracture unchanged. Mild loss of anterior vertebral body height of T12 unchanged. Posterior fusion hardware intact over the L4-L5 level with interbody fusion. Bilateral hip prostheses are otherwise unchanged. Note that there is  moderate streak artifact over the pelvis due to bilateral hip prostheses. No acute soft tissue abnormality. IMPRESSION: 1. No active GI bleeding. 2. Wall thickening of the gastric antrum without adjacent inflammatory change or fluid as findings may be seen with gastritis. 3. Moderate diverticulosis of the colon, most prominent in the sigmoid colon, without active inflammation. 4. Aortic atherosclerosis. Electronically signed by: Toribio Agreste MD 07/20/2024 01:56 PM EST RP Workstation: [id number]   DG Chest Port 1 View Result  Date: 07/20/2024 EXAM: 1 VIEW(S) XRAY OF THE CHEST 07/20/2024 11:20:00 AM COMPARISON: 07/17/2024 CLINICAL HISTORY: 89 year old female. Questionable sepsis - evaluate for abnormality. FINDINGS: LUNGS AND PLEURA: No focal pulmonary opacity. No pleural effusion. No pneumothorax. HEART AND MEDIASTINUM: Aortic atherosclerosis. No acute abnormality of the cardiac and mediastinal silhouettes. BONES AND SOFT TISSUES: Advanced degenerative changes in left shoulder. No acute osseous abnormality. IMPRESSION: 1. No acute cardiopulmonary abnormality. Electronically signed by: Helayne Hurst MD 07/20/2024 11:58 AM EST RP Workstation: [id number]      Impression / Plan:   Assessment: Principal Problem:   Acute blood loss anemia (ABLA) Active Problems:   Essential hypertension   Mixed hyperlipidemia   PAD (peripheral artery disease)   CKD (chronic kidney disease) stage 3, GFR 30-59 ml/min (HCC)   Leukocytosis   Dementia without behavioral disturbance (HCC)   Alicia Mccann is a 89 y.o. y/o female with who was discharged from the hospital with rhabdomyolysis and was home when she started to have black stools and shortness of breath.  The patient was brought to the hospital and was found to have a drop of 3 g in her hemoglobin from yesterday.  The patient was ordered blood and Protonix and a repeat hemoglobin a few hours later went down to 6.3.  A stat GI consult was ordered and the patient was  seen.  Plan:  I have spoken to the patient's daughter who is the power of attorney about the risks benefits and alternatives of an upper endoscopy with trying to control the bleeding.  The patient had a negative CT angiography which could indicate that the bleeding has slowed down or stopped.  If the lesion can be found we will try to treat it appropriately.  If the patient should continue to have bleeding or the site of bleeding cannot be controlled then vascular surgery should be contacted for possible embolization.  Further recommendations to follow after the endoscopy.  Thank you for involving me in the care of this patient.      LOS: 0 days   Rogelia Copping, MD, MD. NOLIA 07/20/2024, 5:33 PM,  Pager [phone] 7am-5pm  Check AMION for 5pm -7am coverage and on weekends   Note: This dictation was prepared with Dragon dictation along with smaller phrase technology. Any transcriptional errors that result from this process are unintentional.

## 2024-07-20 NOTE — Sepsis Progress Note (Signed)
 Code Sepsis protocol being monitored by eLink.

## 2024-07-20 NOTE — ED Notes (Signed)
 Pt had dark stool. Pt cleaned and rectal temp checked at this time. 99.65f. Bair Hugger removed. Dr Levander notified.

## 2024-07-20 NOTE — H&P (Signed)
 History and Physical    Alicia Mccann FMW:[id number] DOB: Jul 03, 1933 DOA: 07/20/2024  DOS: the patient was seen and examined on 07/20/2024  PCP: Alicia Shad, MD   Patient coming from: Home  I have personally briefly reviewed patient's old medical records in [facility] and CareEverywhere  HPI:   Alicia Mccann is a 89 y.o. year old female with medical history of HTN, HLD, CKDIIIa, Dementia, MDD and GAD presenting to the ED with worsening shortness of breath.    Pt unable to provide any history.  She states she wants to be with her dog.  She is able to tell me her name but not her location or year.  Per daughter at bedside she is able to tell her name and her location but has poor memory where she forgets her conversations every minute and is disoriented to time at baseline.  She was discharged from the hospital yesterday after the admission for rhabdomyolysis but her caregiver contacted EMS when she noted patient had large black bowel movement which has not occurred before.  She was also developing some shortness of breath.  Her daughter was at bedside and I tried to reach out to the caregiver but was unsuccessful.   On arrival to the ED patient was noted to be HDS stable.  CBC with unchanged leukocytosis at 21K and significant hemoglobin drop at 7.6 from 10 1 day ago.  CMP with significant AGMA with bicarb at 13 and VBG checked which shows 7.2 3/42/17.6.  Lactic acid initially elevated at greater than 9 but repeat at 2.1.  Troponin checked elevated and downtrending.  INR 1.1.  Chest x-ray negative and CTA GI bleed study negative for acute bleed and without any other acute findings but did show some thickening of the gastric antrum and moderate diverticulosis.  Given her presentation including a moderate hemoglobin drop and dark stools, [facility] contacted for admission.  Patient is a current dark bowel movement and repeat H&H was performed that showed hemoglobin dropping to 6.3.   Patient was still hemodynamically stable.  Stat GI paged was initiated.    Review of Systems: As mentioned in the history of present illness. All other systems reviewed and are negative.   Past Medical History:  Diagnosis Date   Allergic rhinitis    Anemia    Anxiety    Cellulitis    Cholelithiasis    Essential hypertension    History of stress test    a. 12/2008 Ex MV: EF 78%, no ischemia.   Insomnia    Osteoarthritis    Tachycardia    Valvular heart disease    a. 12/2008 Echo: EF 65%, mild MR;  b. 2/6 SEM RUSB - insignificant murmur, prob Ao Sclerosis.    Past Surgical History:  Procedure Laterality Date   BACK SURGERY     BUNIONECTOMY     CATARACT EXTRACTION W/PHACO Right 05/18/2019   Procedure: CATARACT EXTRACTION PHACO AND INTRAOCULAR LENS PLACEMENT (IOC) RIGHT MALYUGIN  01:06.5  20.6%  13.72;  Surgeon: Alicia Gaskin, MD;  Location: [facility];  Service: Ophthalmology;  Laterality: Right;  please leave patient arrival 10   CATARACT EXTRACTION W/PHACO Left 06/08/2019   Procedure: CATARACT EXTRACTION PHACO AND INTRAOCULAR LENS PLACEMENT (IOC) LEFT  00:51.0  20.3%  10.37;  Surgeon: Alicia Gaskin, MD;  Location: [facility];  Service: Ophthalmology;  Laterality: Left;  ARRIVAL 10:30 PLEASE LEAVE   CHOLECYSTECTOMY  03/29/2007   GALLBLADDER SURGERY     HIP SURGERY  bilateral    REPLACEMENT TOTAL KNEE Right    THUMB ARTHROSCOPY       Allergies  Allergen Reactions   Clindamycin /Lincomycin    Egg Protein (Egg White)    Egg Protein-Containing Drug Products    Flagyl  [Metronidazole ]    Levaquin [Levofloxacin]    Mobic  [Meloxicam ]     GI issues    Penicillins    Sulfa Antibiotics     Family History  Problem Relation Age of Onset   Heart attack Father    Stroke Father    Heart disease Father    CAD Father    Heart attack Son 43       MI   Hypertension Son    Hypertension Mother    Heart disease Mother    Stroke Mother    Breast  cancer Neg Hx     Prior to Admission medications   Medication Sig Start Date End Date Taking? Authorizing Provider  acetaminophen  (TYLENOL ) 500 MG tablet Take 1,000 mg by mouth every 8 (eight) hours as needed for mild pain or moderate pain.    [provider]  amLODipine  (NORVASC ) 5 MG tablet Take 1 tablet (5 mg total) by mouth daily. 07/20/24   Amin, Sumayya, MD  aspirin  81 MG EC tablet Take 81 mg by mouth daily. Swallow whole.    [provider]  atorvastatin  (LIPITOR) 10 MG tablet Take 1 tablet (10 mg total) by mouth daily. 06/29/24   Gollan, Timothy J, MD  cholecalciferol  (VITAMIN D ) 1000 UNITS tablet Take 1,000 Units by mouth every other day.     [provider]  cyanocobalamin  (VITAMIN B12) 1000 MCG tablet Take 1,000 mcg by mouth daily.    [provider]  lidocaine  (LIDODERM ) 5 % Place 1 patch onto the skin daily. Remove & Discard patch within 12 hours or as directed by MD 07/19/24   Amin, Sumayya, MD  sertraline  (ZOLOFT ) 25 MG tablet Take 1 tablet (25 mg total) by mouth daily. 11/26/23   Alicia Shad, MD  traMADol DANNY) 50 MG tablet Take 50 mg by mouth. 07/04/24   [provider]    Social History:  reports that she has never smoked. She has never used smokeless tobacco. She reports current alcohol use. She reports that she does not use drugs. Lives by herself but has multiple caregivers. Tobacco- Denies use. EtOH- Denies use.  Illicit drug use- denies use.  IADLs/ADLs-needs assistance   Physical Exam: Vitals:   07/20/24 1103 07/20/24 1400 07/20/24 1429  BP: 136/80 (!) 122/55   Pulse: (!) 140 (!) 117 (!) 107  Resp: (!) 21 19 (!) 22  Temp: (!) 94.8 F (34.9 C)  99.1 F (37.3 C)  TempSrc: Rectal  Rectal  SpO2: 100% 100% 100%  Weight: 50.3 kg    Height: 5' 2 (1.575 m)       Gen: NAD HENT: Dry MM CV: Regular rhythm, tachycardic rate Resp: CTAB Abd: Mild TTP in the epigastric region, normal bowel sounds MSK: No  asymmetry Skin: Multiple chronic bruises noted on upper extremities Neuro: Alert to self but disoriented otherwise Psych: Pleasantly confused   Labs on Admission: I have personally reviewed following labs and imaging studies  CBC: Recent Labs  Lab 07/17/24 1525 07/18/24 0052 07/19/24 0407 07/20/24 1123 07/20/24 1550  WBC 22.8* 23.5* 19.0* 21.2*  --   NEUTROABS  --   --  14.9* 18.6*  --   HGB 13.2 10.6* 10.0* 7.6* 6.3*  HCT 41.3 33.6* 32.0*  25.7* 19.4*  MCV 85.9 87.5 88.2 93.5  --   PLT 495* 337 320 337  --    Basic Metabolic Panel: Recent Labs  Lab 07/17/24 1525 07/17/24 2212 07/18/24 0052 07/18/24 1031 07/19/24 0407 07/20/24 1123  NA 141  --  137  --  137 140  K 2.9*  --  2.8*  --  4.3 3.7  CL 99  --  102  --  105 105  CO2 24  --  23  --  23 13*  GLUCOSE 174*  --  105*  --  96 272*  BUN 43*  --  37*  --  25* 82*  CREATININE 1.49*  --  1.13*  --  0.96 1.09*  CALCIUM  10.0  --  9.1  --  9.1 8.8*  MG  --  1.9  --  1.8 1.7  --   PHOS  --  4.2  --   --  2.1*  --    GFR: Estimated Creatinine Clearance: 27.7 mL/min (A) (by C-G formula based on SCr of 1.09 mg/dL (H)). Liver Function Tests: Recent Labs  Lab 07/19/24 0407 07/20/24 1123  AST  --  39  ALT  --  31  ALKPHOS  --  74  BILITOT  --  0.4  PROT  --  4.9*  ALBUMIN 3.2* 3.0*   No results for input(s): LIPASE, AMYLASE in the last 168 hours. No results for input(s): AMMONIA in the last 168 hours. Coagulation Profile: Recent Labs  Lab 07/20/24 1123  INR 1.1   Cardiac Enzymes: Recent Labs  Lab 07/17/24 1525 07/18/24 0052 07/19/24 0407 07/20/24 1123  CKTOTAL 1,341* 396* 87 98   BNP (last 3 results) No results for input(s): BNP in the last 8760 hours. HbA1C: No results for input(s): HGBA1C in the last 72 hours. CBG: No results for input(s): GLUCAP in the last 168 hours. Lipid Profile: No results for input(s): CHOL, HDL, LDLCALC, TRIG, CHOLHDL, LDLDIRECT in the last 72  hours. Thyroid  Function Tests: No results for input(s): TSH, T4TOTAL, FREET4, T3FREE, THYROIDAB in the last 72 hours. Anemia Panel: No results for input(s): VITAMINB12, FOLATE, FERRITIN, TIBC, IRON, RETICCTPCT in the last 72 hours. Urine analysis:    Component Value Date/Time   COLORURINE STRAW (A) 07/20/2024 1123   APPEARANCEUR CLEAR (A) 07/20/2024 1123   LABSPEC 1.018 07/20/2024 1123   PHURINE 5.0 07/20/2024 1123   GLUCOSEU 50 (A) 07/20/2024 1123   GLUCOSEU NEGATIVE 12/29/2017 1410   HGBUR NEGATIVE 07/20/2024 1123   BILIRUBINUR NEGATIVE 07/20/2024 1123   BILIRUBINUR negative 12/09/2016 1007   KETONESUR NEGATIVE 07/20/2024 1123   PROTEINUR NEGATIVE 07/20/2024 1123   UROBILINOGEN 0.2 12/29/2017 1410   NITRITE NEGATIVE 07/20/2024 1123   LEUKOCYTESUR NEGATIVE 07/20/2024 1123    Radiological Exams on Admission: I have personally reviewed images CT ANGIO GI BLEED Result Date: 07/20/2024 EXAM: CTA ABDOMEN AND PELVIS WITH CONTRAST 07/20/2024 12:55:36 PM TECHNIQUE: CTA images of the abdomen and pelvis with 80 mL iohexol (OMNIPAQUE) 350 MG/ML injection 100 mL IOHEXOL 350 MG/ML SOLN intravenous contrast. Three-dimensional MIP/volume rendered formations were performed. Automated exposure control, iterative reconstruction, and/or weight based adjustment of the mA/kV was utilized to reduce the radiation dose to as low as reasonably achievable. COMPARISON: CT abdomen/pelvis 07/18/2024, CT 03/18/2019. CLINICAL HISTORY: GI bleed, significantly elevated lactic, eval ischemia. FINDINGS: VASCULATURE: GI BLEED: No significant enhancement to suggest acute/active GI bleed. AORTA: There is moderate calcified plaque of the abdominal aorta which is normal in caliber. No abdominal  aortic aneurysm. No dissection. CELIAC TRUNK: Calcified plaque at the origin of the celiac artery. No occlusion or significant stenosis. SUPERIOR MESENTERIC ARTERY: Calcified plaque at the origin of the superior  mesenteric artery. No occlusion or significant stenosis. INFERIOR MESENTERIC ARTERY: No acute finding. No occlusion or significant stenosis. RENAL ARTERIES: Calcified plaque at the origin of the renal arteries which are otherwise patent. No occlusion or significant stenosis. ILIAC ARTERIES: No acute finding. No occlusion or significant stenosis. ABDOMEN/PELVIS: LOWER CHEST: Mild stable cardiomegaly. Calcified plaque over the visualized descending thoracic aorta. Visualized lung bases demonstrate no acute airspace process or effusion. There is a 7 mm nodule over the left lower lobe (image 3) unchanged from 2020 and therefore considered benign. LIVER: The liver is unremarkable. GALLBLADDER AND BILE DUCTS: Previous cholecystectomy. No biliary ductal dilatation. SPLEEN: The spleen is unremarkable. PANCREAS: The pancreas is unremarkable. ADRENAL GLANDS: Bilateral adrenal glands demonstrate no acute abnormality. KIDNEYS, URETERS AND BLADDER: No stones in the kidneys or ureters. No hydronephrosis. No perinephric or periureteral stranding. Urinary bladder is unremarkable. GI AND BOWEL: There is wall thickening of the gastric antrum without adjacent inflammatory change or fluid. These findings could be seen with gastritis. Moderate diverticulosis of the colon most prominent of the sigmoid colon without definite active inflammation. There is no bowel obstruction. No abnormal bowel wall thickening or distension. REPRODUCTIVE: Stable simple appearing 2.5 cm right ovarian cyst. PERITONEUM AND RETROPERITONEUM: No ascites or free air. LYMPH NODES: No lymphadenopathy. BONES AND SOFT TISSUES: T11 compression fracture unchanged. Mild loss of anterior vertebral body height of T12 unchanged. Posterior fusion hardware intact over the L4-L5 level with interbody fusion. Bilateral hip prostheses are otherwise unchanged. Note that there is moderate streak artifact over the pelvis due to bilateral hip prostheses. No acute soft tissue  abnormality. IMPRESSION: 1. No active GI bleeding. 2. Wall thickening of the gastric antrum without adjacent inflammatory change or fluid as findings may be seen with gastritis. 3. Moderate diverticulosis of the colon, most prominent in the sigmoid colon, without active inflammation. 4. Aortic atherosclerosis. Electronically signed by: Toribio Agreste MD 07/20/2024 01:56 PM EST RP Workstation: [id number]   DG Chest Port 1 View Result Date: 07/20/2024 EXAM: 1 VIEW(S) XRAY OF THE CHEST 07/20/2024 11:20:00 AM COMPARISON: 07/17/2024 CLINICAL HISTORY: 89 year old female. Questionable sepsis - evaluate for abnormality. FINDINGS: LUNGS AND PLEURA: No focal pulmonary opacity. No pleural effusion. No pneumothorax. HEART AND MEDIASTINUM: Aortic atherosclerosis. No acute abnormality of the cardiac and mediastinal silhouettes. BONES AND SOFT TISSUES: Advanced degenerative changes in left shoulder. No acute osseous abnormality. IMPRESSION: 1. No acute cardiopulmonary abnormality. Electronically signed by: Helayne Hurst MD 07/20/2024 11:58 AM EST RP Workstation: [id number]    EKG: My personal interpretation of EKG shows: Atrial fibrillation with RVR without any acute ST changes.   Patient noted to be in sinus rhythm on auscultation so repeat EKG was done that showed sinus tachycardia without any acute ST changes.   Assessment/Plan Principal Problem:   Acute blood loss anemia (ABLA) Active Problems:   Dementia without behavioral disturbance (HCC)   Leukocytosis   Essential hypertension   Mixed hyperlipidemia   PAD (peripheral artery disease)   CKD (chronic kidney disease) stage 3, GFR 30-59 ml/min (HCC)   Acute blood loss anemia secondary to likely a upper GI bleed Patient with symptomatic anemia secondary to UGIB given significant elevation of the BUN.  Status post IV PPI.  Will keep NPO.  Hemodynamically stable.  No previous colonoscopy or endoscopy data present.  Daughter at bedside is unaware as well.  GI  consulted.  Appreciate their recommendations.  Will serially monitor H&H.  Leukocytosis: No signs of infection.  Can be reactive in setting of GI bleed but patient has recent steroid use.   Lactic acidosis: Improving.  Patient getting IVF.  Appears to be multifactorial in setting of GI bleed.  Creatinine elevation: Mild.  Will trend.  Type 2 diabetes: Will start on SSI. Likely residual effect from her steroid use.  Dementia: Limiting factor for the patient.  Will place delirium precautions.  MDD/GAD: Holding home med until patient is able to tolerate p.o. intake.  Hypertension: Holding home med given patient has an active GI bleed.  Hyperlipidemia: Holding home med given patient is NPO.  Paroxysmal A-fib: Episode noted during initial EKG.  No history of A-fib on previous record and repeat EKG showed sinus tachycardia.  Will monitor her to see if this recurs.  Plan to check telemetry daily.  Failure to thrive: Patient with poor functional status at baseline given her moderate to severe dementia along with recent admission for rhabdomyolysis.  Discussed with daughter at bedside that she has overall poor prognosis.  Daughter states she has some paperwork regarding patient's wishes but that is at home and she is unsure what they entail.  She will look for them but would want to continue full measures for the time being.   VTE prophylaxis:  SCDs  Diet: N.p.o. Code Status:  Full Code Telemetry:  Admission status: Inpatient, Progressive Patient is from: Home Anticipated d/c is to: Home Anticipated d/c is in: 3-4 days   Family Communication: Updated at bedside  Consults called: Placed stat consult and paged Dr. Jinny   Severity of Illness: The appropriate patient status for this patient is INPATIENT. Inpatient status is judged to be reasonable and necessary in order to provide the required intensity of service to ensure the patient's safety. The patient's presenting symptoms, physical  exam findings, and initial radiographic and laboratory data in the context of their chronic comorbidities is felt to place them at high risk for further clinical deterioration. Furthermore, it is not anticipated that the patient will be medically stable for discharge from the hospital within 2 midnights of admission.   * I certify that at the point of admission it is my clinical judgment that the patient will require inpatient hospital care spanning beyond 2 midnights from the point of admission due to high intensity of service, high risk for further deterioration and high frequency of surveillance required.DEWAINE Morene Bathe, MD [facility]ol[facility] DEL. [facility]

## 2024-07-20 NOTE — ED Notes (Signed)
 Pt cleaned of moderate, black, loose stool.

## 2024-07-20 NOTE — ED Notes (Signed)
 CCMD called and patient placed on cardiac telemetry

## 2024-07-20 NOTE — Anesthesia Preprocedure Evaluation (Signed)
 Anesthesia Evaluation  Patient identified by MRN, date of birth, ID band Patient awake    Reviewed: Allergy & Precautions, H&P , NPO status , Patient's Chart, lab work & pertinent test results, reviewed documented beta blocker date and time   History of Anesthesia Complications Negative for: history of anesthetic complications  Airway Mallampati: III  TM Distance: >3 FB Neck ROM: full    Dental  (+) Dental Advidsory Given, Poor Dentition, Chipped   Pulmonary neg pulmonary ROS   Pulmonary exam normal breath sounds clear to auscultation       Cardiovascular Exercise Tolerance: Good hypertension, (-) angina + CAD and + Peripheral Vascular Disease  (-) Past MI and (-) Cardiac Stents Normal cardiovascular exam(-) dysrhythmias + Valvular Problems/Murmurs  Rhythm:regular Rate:Normal     Neuro/Psych  PSYCHIATRIC DISORDERS Anxiety Depression   Dementia negative neurological ROS     GI/Hepatic negative GI ROS, Neg liver ROS,,,  Endo/Other  negative endocrine ROS    Renal/GU Renal disease (stage 3 CKD)  negative genitourinary   Musculoskeletal   Abdominal   Peds  Hematology  (+) Blood dyscrasia, anemia   Anesthesia Other Findings Past Medical History: No date: Allergic rhinitis No date: Anemia No date: Anxiety No date: Cellulitis No date: Cholelithiasis No date: Essential hypertension No date: History of stress test     Comment:  a. 12/2008 Ex MV: EF 78%, no ischemia. No date: Insomnia No date: Osteoarthritis No date: Tachycardia No date: Valvular heart disease     Comment:  a. 12/2008 Echo: EF 65%, mild MR;  b. 2/6 SEM RUSB -               insignificant murmur, prob Ao Sclerosis.   Reproductive/Obstetrics negative OB ROS                              Anesthesia Physical Anesthesia Plan  ASA: 3 and emergent  Anesthesia Plan: General   Post-op Pain Management:    Induction:  Intravenous, Rapid sequence and Cricoid pressure planned  PONV Risk Score and Plan: 3 and Ondansetron , Dexamethasone  and Treatment may vary due to age or medical condition  Airway Management Planned: Oral ETT  Additional Equipment:   Intra-op Plan:   Post-operative Plan: Extubation in OR  Informed Consent: I have reviewed the patients History and Physical, chart, labs and discussed the procedure including the risks, benefits and alternatives for the proposed anesthesia with the patient or authorized representative who has indicated his/her understanding and acceptance.     Dental Advisory Given  Plan Discussed with: Anesthesiologist, CRNA and Surgeon  Anesthesia Plan Comments:          Anesthesia Quick Evaluation

## 2024-07-20 NOTE — Transfer of Care (Signed)
 Immediate Anesthesia Transfer of Care Note  Patient: Evonna L Johnsen  Procedure(s) Performed: EGD (ESOPHAGOGASTRODUODENOSCOPY) CONTROL OF HEMORRHAGE, GI TRACT, ENDOSCOPIC, BY CLIPPING OR OVERSEWING EGD, WITH ARGON PLASMA COAGULATION  Patient Location: PACU and Endoscopy Unit  Anesthesia Type:General  Level of Consciousness: drowsy and patient cooperative  Airway & Oxygen Therapy: Patient Spontanous Breathing and Patient connected to face mask oxygen  Post-op Assessment: Report given to RN and Post -op Vital signs reviewed and stable  Post vital signs: Reviewed and stable  Last Vitals:  Vitals Value Taken Time  BP 136/55 07/20/2024 1856  Temp    Pulse 105   Resp    SpO2 100     Last Pain:  Vitals:   07/20/24 1731  TempSrc: Temporal         Complications: No notable events documented.

## 2024-07-21 ENCOUNTER — Encounter: Payer: Self-pay | Admitting: Gastroenterology

## 2024-07-21 DIAGNOSIS — K253 Acute gastric ulcer without hemorrhage or perforation: Secondary | ICD-10-CM | POA: Diagnosis not present

## 2024-07-21 DIAGNOSIS — D62 Acute posthemorrhagic anemia: Secondary | ICD-10-CM | POA: Diagnosis not present

## 2024-07-21 DIAGNOSIS — K269 Duodenal ulcer, unspecified as acute or chronic, without hemorrhage or perforation: Secondary | ICD-10-CM | POA: Diagnosis not present

## 2024-07-21 LAB — HEMOGLOBIN AND HEMATOCRIT, BLOOD
HCT: 19.5 % — ABNORMAL LOW (ref 36.0–46.0)
Hemoglobin: 6.6 g/dL — ABNORMAL LOW (ref 12.0–15.0)

## 2024-07-21 LAB — CBC
HCT: 21.3 % — ABNORMAL LOW (ref 36.0–46.0)
Hemoglobin: 7.1 g/dL — ABNORMAL LOW (ref 12.0–15.0)
MCH: 28.6 pg (ref 26.0–34.0)
MCHC: 33.3 g/dL (ref 30.0–36.0)
MCV: 85.9 fL (ref 80.0–100.0)
Platelets: 230 K/uL (ref 150–400)
RBC: 2.48 MIL/uL — ABNORMAL LOW (ref 3.87–5.11)
RDW: 14.2 % (ref 11.5–15.5)
WBC: 19.1 K/uL — ABNORMAL HIGH (ref 4.0–10.5)
nRBC: 0 % (ref 0.0–0.2)

## 2024-07-21 LAB — BLOOD CULTURE ID PANEL (REFLEXED) - BCID2

## 2024-07-21 LAB — GLUCOSE, CAPILLARY
Glucose-Capillary: 82 mg/dL (ref 70–99)
Glucose-Capillary: 88 mg/dL (ref 70–99)
Glucose-Capillary: 92 mg/dL (ref 70–99)
Glucose-Capillary: 93 mg/dL (ref 70–99)
Glucose-Capillary: 95 mg/dL (ref 70–99)
Glucose-Capillary: 98 mg/dL (ref 70–99)
Glucose-Capillary: 99 mg/dL (ref 70–99)

## 2024-07-21 LAB — BASIC METABOLIC PANEL WITH GFR
Anion gap: 9 (ref 5–15)
BUN: 61 mg/dL — ABNORMAL HIGH (ref 8–23)
CO2: 24 mmol/L (ref 22–32)
Calcium: 8.4 mg/dL — ABNORMAL LOW (ref 8.9–10.3)
Chloride: 107 mmol/L (ref 98–111)
Creatinine, Ser: 1.02 mg/dL — ABNORMAL HIGH (ref 0.44–1.00)
GFR, Estimated: 52 mL/min — ABNORMAL LOW (ref >60)
Glucose, Bld: 93 mg/dL (ref 70–99)
Potassium: 3 mmol/L — ABNORMAL LOW (ref 3.5–5.1)
Sodium: 139 mmol/L (ref 135–145)

## 2024-07-21 LAB — PREPARE RBC (CROSSMATCH)

## 2024-07-21 MED ORDER — SODIUM CHLORIDE 0.9% IV SOLUTION: Freq: Once | INTRAVENOUS | Status: AC

## 2024-07-21 MED ORDER — ALPRAZOLAM 0.25 MG PO TABS
0.2500 mg | ORAL_TABLET | Freq: Three times a day (TID) | ORAL | Status: DC | PRN
  Administered 2024-07-21 – 2024-07-22 (×3): 0.25 mg via ORAL
  Filled 2024-07-21 (×3): qty 1

## 2024-07-21 MED ORDER — QUETIAPINE FUMARATE 25 MG PO TABS
25.0000 mg | ORAL_TABLET | Freq: Every day | ORAL | Status: DC
  Administered 2024-07-21: 25 mg via ORAL
  Filled 2024-07-21: qty 1

## 2024-07-21 MED ORDER — POTASSIUM CHLORIDE 10 MEQ/100ML IV SOLN
10.0000 meq | INTRAVENOUS | Status: AC
  Administered 2024-07-21 (×4): 10 meq via INTRAVENOUS
  Filled 2024-07-21 (×4): qty 100

## 2024-07-21 NOTE — Progress Notes (Signed)
 PHARMACY - PHYSICIAN COMMUNICATION CRITICAL VALUE ALERT - BLOOD CULTURE IDENTIFICATION (BCID)  Alicia Mccann is an 89 y.o. female who presented to [facility] on 07/20/2024 with a chief complaint of acute blood loss anemia.   Assessment:  Staph species in 1 of 4 bottles, most likely contaminant  (include suspected source if known)  Name of physician (or Provider) Contacted: [place]   Current antibiotics: not on abx currently   Changes to prescribed antibiotics recommended:  - Provider declined to start abx for now, will defer to day shift   Results for orders placed or performed during the hospital encounter of 07/20/24  Blood Culture ID Panel (Reflexed) (Collected: 07/20/2024 11:48 AM)  Result Value Ref Range   Enterococcus faecalis NOT DETECTED NOT DETECTED   Enterococcus Faecium NOT DETECTED NOT DETECTED   Listeria monocytogenes NOT DETECTED NOT DETECTED   Staphylococcus species DETECTED (A) NOT DETECTED   Staphylococcus aureus (BCID) NOT DETECTED NOT DETECTED   Staphylococcus epidermidis NOT DETECTED NOT DETECTED   Staphylococcus lugdunensis NOT DETECTED NOT DETECTED   Streptococcus species NOT DETECTED NOT DETECTED   Streptococcus agalactiae NOT DETECTED NOT DETECTED   Streptococcus pneumoniae NOT DETECTED NOT DETECTED   Streptococcus pyogenes NOT DETECTED NOT DETECTED   A.calcoaceticus-baumannii NOT DETECTED NOT DETECTED   Bacteroides fragilis NOT DETECTED NOT DETECTED   Enterobacterales NOT DETECTED NOT DETECTED   Enterobacter cloacae complex NOT DETECTED NOT DETECTED   Escherichia coli NOT DETECTED NOT DETECTED   Klebsiella aerogenes NOT DETECTED NOT DETECTED   Klebsiella oxytoca NOT DETECTED NOT DETECTED   Klebsiella pneumoniae NOT DETECTED NOT DETECTED   Proteus species NOT DETECTED NOT DETECTED   Salmonella species NOT DETECTED NOT DETECTED   Serratia marcescens NOT DETECTED NOT DETECTED   Haemophilus influenzae NOT DETECTED NOT DETECTED   Neisseria meningitidis  NOT DETECTED NOT DETECTED   Pseudomonas aeruginosa NOT DETECTED NOT DETECTED   Stenotrophomonas maltophilia NOT DETECTED NOT DETECTED   Candida albicans NOT DETECTED NOT DETECTED   Candida auris NOT DETECTED NOT DETECTED   Candida glabrata NOT DETECTED NOT DETECTED   Candida krusei NOT DETECTED NOT DETECTED   Candida parapsilosis NOT DETECTED NOT DETECTED   Candida tropicalis NOT DETECTED NOT DETECTED   Cryptococcus neoformans/gattii NOT DETECTED NOT DETECTED    Logun Colavito D 07/21/2024  6:25 AM

## 2024-07-21 NOTE — Hospital Course (Signed)
 Alicia Mccann is a 89 y.o. year old female with past medical history of hypertension, hyperlipidemia, CKD stage IIIa, dementia, manic depressive disorder, generalized anxiety disorder presented to the hospital with worsening shortness of breath.  Of note patient was recently discharged from the hospital 1 day prior to presentation after being admitted for rhabdomyolysis.  At this time patient presented with black bowel movements and shortness of breath.  In the ED patient was hemodynamically stable.  CBC showed leukocytosis at 21K with hemoglobin dropped at 7.6 from 10, 1 day pack.  CMP showed anion gap metabolic acidosis with bicarb of 13.  Lactic acid initially elevated at 9 but repeat at 2.1.  Troponins were elevated which were downtrending.  INR of 1.1.  Chest x-ray was negative.  CTA GI bleed was negative for acute bleed.  GI was consulted and patient was admitted hospital for further evaluation and treatment   Acute symptomatic blood loss anemia secondary to upper GI bleed. Patient was seen by GI and underwent EGD on 07/20/2024 with findings of nonbleeding gastric ulcer with adherent clot which was treated with APC and 2 bleeding angioplastic lesions in the duodenum were treated with APC as well.  Patient also had nonbleeding duodenal ulcer.  Continue PPI.  Hemoglobin today at 7.1.  Transfuse for ongoing bleeding or less than 7.  Staph bacteremia. Likely contaminant.  Not on antibiotics.  Will continue to monitor closely.  Leukocytosis: WBC today at 19.1 from initial 21.2.[name]  Chest x-ray was negative for any infiltrate.  Urinalysis negative for infection temperature of 99.1 degrees.  Continue to monitor closely.   Lactic acidosis: Received IV fluids with improvement in lactate.   Type 2 diabetes: Continue sliding scale insulin .,  Closely monitor blood glucose levels.  Hypokalemia.  Potassium of 3.0 today.  Will replace through IV.  Check levels in AM.   Dementia: Continue delirium  precautions.   MDD/GAD: Holding home med until patient is able to tolerate p.o. intake.   Hypertension: Antihypertensives on hold at this time   Hyperlipidemia:    Paroxysmal A-fib: 1 episode noted during initial EKG.  Continue to monitor closely.  Failure to thrive Dementia. Recent admission for rhabdomyolysis

## 2024-07-21 NOTE — Plan of Care (Signed)
  Problem: Coping: Goal: Ability to adjust to condition or change in health will improve Outcome: Progressing   Problem: Fluid Volume: Goal: Ability to maintain a balanced intake and output will improve Outcome: Progressing   Problem: Metabolic: Goal: Ability to maintain appropriate glucose levels will improve Outcome: Progressing   Problem: Nutritional: Goal: Maintenance of adequate nutrition will improve Outcome: Progressing   Problem: Skin Integrity: Goal: Risk for impaired skin integrity will decrease Outcome: Progressing   Problem: Tissue Perfusion: Goal: Adequacy of tissue perfusion will improve Outcome: Progressing   Problem: Education: Goal: Knowledge of General Education information will improve Description: Including pain rating scale, medication(s)/side effects and non-pharmacologic comfort measures Outcome: Not Progressing   Problem: Health Behavior/Discharge Planning: Goal: Ability to manage health-related needs will improve Outcome: Not Progressing

## 2024-07-21 NOTE — Progress Notes (Addendum)
 PROGRESS NOTE  Alicia Mccann FMW:[id number] DOB: [birth year] DOA: 07/20/2024 PCP: Glendia Shad, MD   LOS: 1 day   Brief narrative:  Alicia Mccann is a 89 y.o. year old female with past medical history of hypertension, hyperlipidemia, CKD stage IIIa, dementia, manic depressive disorder, generalized anxiety disorder presented to the hospital with worsening shortness of breath.  Of note patient was recently discharged from the hospital 1 day prior to presentation after being admitted for rhabdomyolysis.  At this time, patient presented with black bowel movements and shortness of breath.  In the ED, patient was hemodynamically stable.  CBC showed leukocytosis at 21K with hemoglobin dropped at 7.6 from 10, 1 day pack.  CMP showed anion gap metabolic acidosis with bicarb of 13.  Lactic acid initially elevated at 9 but repeat at 2.1.  Troponins were elevated which were downtrending.  INR of 1.1.  Chest x-ray was negative.  CTA GI bleed was negative for acute bleed.  GI was consulted and patient was admitted hospital for further evaluation and treatment    Assessment/Plan: Principal Problem:   Acute blood loss anemia (ABLA) Active Problems:   Dementia without behavioral disturbance (HCC)   Leukocytosis   Essential hypertension   Mixed hyperlipidemia   PAD (peripheral artery disease)   CKD (chronic kidney disease) stage 3, GFR 30-59 ml/min (HCC)   Blood in stool   Duodenal ulcer   Acute gastric ulcer  Acute symptomatic blood loss anemia secondary to upper GI bleed. Patient was seen by GI and underwent EGD on 07/20/2024 with findings of nonbleeding gastric ulcer with adherent clot which was treated with APC and 2 bleeding angioplastic lesions in the duodenum were treated with APC as well.  Patient also had nonbleeding duodenal ulcer.  Continue PPI.  Hemoglobin today at 7.1.  Transfuse for ongoing bleeding or less than 7.  Still on clear liquids.  Closely monitor for ongoing bleeding.  If  ongoing bleeding vascular surgery or IR might need to attempt embolization.  Continue PPI.  Staph bacteremia. Likely contaminant.  Not on antibiotics.  Will continue to monitor closely.  Still with significant leukocytosis  Leukocytosis: WBC today at 19.1 from initial 21.2.[name]  Chest x-ray was negative for any infiltrate.  Urinalysis negative for infection temperature of 99.1 degrees.  Continue to monitor closely.   Lactic acidosis: Received IV fluids with improvement in lactate.   Type 2 diabetes: Continue sliding scale insulin .,  Closely monitor blood glucose levels.  Hypokalemia.  Potassium of 3.0 today.  Will replace through IV.  Check levels in AM.   Dementia: Continue delirium precautions.   MDD/GAD: On sertraline  at home.  Currently on hold.   Hypertension: Antihypertensives on hold at this time.  On amlodipine  at home.   Hyperlipidemia:  On Lipitor at home.  Currently on hold.  Paroxysmal A-fib: 1 episode noted during initial EKG.  Continue to monitor closely.  Normal candidate for anticoagulation.  Continue telemetry monitoring.  Failure to thrive Dementia. Recent admission for rhabdomyolysis, continue supportive care will get PT evaluation for  DVT prophylaxis: SCDs Start: 07/20/24 1631   Disposition: Likely home with home health in 1 to 2 days.  Follow GI recommendations.  Status is: Inpatient Remains inpatient appropriate because: Pending clinical improvement    Code Status:     Code Status: Full Code  Family Communication: None at bedside.  I tried to reach patient's daughter Ms. Glendale on the phone few times but was unable to reach out to her today.  Consultants: GI  Procedures: EGD with APC treatment  Anti-infectives:  None  Anti-infectives (From admission, onward)    Start     Dose/Rate Route Frequency Ordered Stop   07/20/24 1100  vancomycin (VANCOCIN) IVPB 1000 mg/200 mL premix        1,000 mg 200 mL/hr over 60 Minutes Intravenous  Once  07/20/24 1057 07/20/24 1336   07/20/24 1100  ceFEPIme (MAXIPIME) 2 g in sodium chloride  0.9 % 100 mL IVPB        2 g 200 mL/hr over 30 Minutes Intravenous  Once 07/20/24 1057 07/20/24 1236   07/20/24 1100  azithromycin (ZITHROMAX) 500 mg in sodium chloride  0.9 % 250 mL IVPB        500 mg 250 mL/hr over 60 Minutes Intravenous  Once 07/20/24 1057 07/20/24 1508        Subjective: Today, patient was seen and examined at bedside.  Patient hard of hearing.  Alert awake and oriented x 1.  Has underlying dementia.  Had some dark stool but no frank red blood.  Objective: Vitals:   07/21/24 0800 07/21/24 0900  BP: 126/63 110/63  Pulse: 96 96  Resp: 19 (!) 25  Temp:    SpO2: 98% 100%    Intake/Output Summary (Last 24 hours) at 07/21/2024 1116 Last data filed at 07/21/2024 0600 Gross per 24 hour  Intake 2008 ml  Output 500 ml  Net 1508 ml   Filed Weights   07/20/24 1103 07/20/24 2206 07/21/24 0500  Weight: 50.3 kg 52 kg 52 kg   Body mass index is 20.97 kg/m.   Physical Exam: GENERAL: Patient is alert awake and Communicative, oriented to self and place.  Not in obvious distress. HENT: Pallor noted pupils equally reactive to light. Oral mucosa is moist NECK: is supple, no gross swelling noted. CHEST: Clear to auscultation. No crackles or wheezes.   CVS: S1 and S2 heard, no murmur. Regular rate and rhythm.  ABDOMEN: Soft, non-tender, bowel sounds are present. EXTREMITIES: No edema. CNS: Cranial nerves are intact. No focal motor deficits.  History of dementia SKIN: warm and dry without rashes.  Data Review: I have personally reviewed the following laboratory data and studies,  CBC: Recent Labs  Lab 07/17/24 1525 07/18/24 0052 07/19/24 0407 07/20/24 1123 07/20/24 1550 07/20/24 2022 07/21/24 0334  WBC 22.8* 23.5* 19.0* 21.2*  --   --  19.1*  NEUTROABS  --   --  14.9* 18.6*  --   --   --   HGB 13.2 10.6* 10.0* 7.6* 6.3* 7.5* 7.1*  HCT 41.3 33.6* 32.0* 25.7* 19.4* 23.3*  21.3*  MCV 85.9 87.5 88.2 93.5  --   --  85.9  PLT 495* 337 320 337  --   --  230   Basic Metabolic Panel: Recent Labs  Lab 07/17/24 1525 07/17/24 2212 07/18/24 0052 07/18/24 1031 07/19/24 0407 07/20/24 1123 07/20/24 1550 07/21/24 0334  NA 141  --  137  --  137 140  --  139  K 2.9*  --  2.8*  --  4.3 3.7  --  3.0*  CL 99  --  102  --  105 105  --  107  CO2 24  --  23  --  23 13*  --  24  GLUCOSE 174*  --  105*  --  96 272*  --  93  BUN 43*  --  37*  --  25* 82*  --  61*  CREATININE 1.49*  --  1.13*  --  0.96 1.09*  --  1.02*  CALCIUM  10.0  --  9.1  --  9.1 8.8*  --  8.4*  MG  --  1.9  --  1.8 1.7  --  1.8  --   PHOS  --  4.2  --   --  2.1*  --   --   --    Liver Function Tests: Recent Labs  Lab 07/19/24 0407 07/20/24 1123  AST  --  39  ALT  --  31  ALKPHOS  --  74  BILITOT  --  0.4  PROT  --  4.9*  ALBUMIN 3.2* 3.0*   No results for input(s): LIPASE, AMYLASE in the last 168 hours. No results for input(s): AMMONIA in the last 168 hours. Cardiac Enzymes: Recent Labs  Lab 07/17/24 1525 07/18/24 0052 07/19/24 0407 07/20/24 1123  CKTOTAL 1,341* 396* 87 98   BNP (last 3 results) No results for input(s): BNP in the last 8760 hours.  ProBNP (last 3 results) No results for input(s): PROBNP in the last 8760 hours.  CBG: Recent Labs  Lab 07/20/24 2116 07/20/24 2154 07/21/24 0017 07/21/24 0335 07/21/24 0717  GLUCAP 90 95 93 99 82   Recent Results (from the past 240 hours)  Blood culture (routine x 2)     Status: None (Preliminary result)   Collection Time: 07/17/24  4:36 PM   Specimen: BLOOD  Result Value Ref Range Status   Specimen Description BLOOD BLOOD RIGHT FOREARM  Final   Special Requests   Final    BOTTLES DRAWN AEROBIC AND ANAEROBIC Blood Culture adequate volume   Culture   Final    NO GROWTH 3 DAYS Performed at [facility], [address] [place], [place] [address]    Report Status PENDING  Incomplete  Blood culture  (routine x 2)     Status: None (Preliminary result)   Collection Time: 07/17/24  4:41 PM   Specimen: BLOOD  Result Value Ref Range Status   Specimen Description BLOOD LEFT ANTECUBITAL  Final   Special Requests   Final    BOTTLES DRAWN AEROBIC AND ANAEROBIC Blood Culture adequate volume   Culture   Final    NO GROWTH 3 DAYS Performed at [facility], [address], [place], [place] [address]    Report Status PENDING  Incomplete  Resp panel by RT-PCR (RSV, Flu A&B, Covid) Anterior Nasal Swab     Status: None   Collection Time: 07/20/24 11:23 AM   Specimen: Anterior Nasal Swab  Result Value Ref Range Status   SARS Coronavirus 2 by RT PCR NEGATIVE NEGATIVE Final    Comment: (NOTE) SARS-CoV-2 target nucleic acids are NOT DETECTED.  The SARS-CoV-2 RNA is generally detectable in upper respiratory specimens during the acute phase of infection. The lowest concentration of SARS-CoV-2 viral copies this assay can detect is 138 copies/mL. A negative result does not preclude SARS-Cov-2 infection and should not be used as the sole basis for treatment or other patient management decisions. A negative result may occur with  improper specimen collection/handling, submission of specimen other than nasopharyngeal swab, presence of viral mutation(s) within the areas targeted by this assay, and inadequate number of viral copies(<138 copies/mL). A negative result must be combined with clinical observations, patient history, and epidemiological information. The expected result is Negative.  Fact Sheet for Patients:  bloggercourse.com  Fact Sheet for Healthcare Providers:  seriousbroker.it  This test is no t yet approved or  cleared by the [place]  FDA and  has been authorized for detection and/or diagnosis of SARS-CoV-2 by FDA under an Emergency Use Authorization (EUA). This EUA will remain  in effect (meaning this test can be used)  for the duration of the COVID-19 declaration under Section 564(b)(1) of the Act, 21 U.S.C.section 360bbb-3(b)(1), unless the authorization is terminated  or revoked sooner.       Influenza A by PCR NEGATIVE NEGATIVE Final   Influenza B by PCR NEGATIVE NEGATIVE Final    Comment: (NOTE) The Xpert Xpress SARS-CoV-2/FLU/RSV plus assay is intended as an aid in the diagnosis of influenza from Nasopharyngeal swab specimens and should not be used as a sole basis for treatment. Nasal washings and aspirates are unacceptable for Xpert Xpress SARS-CoV-2/FLU/RSV testing.  Fact Sheet for Patients: bloggercourse.com  Fact Sheet for Healthcare Providers: seriousbroker.it  This test is not yet approved or cleared by the [place]  FDA and has been authorized for detection and/or diagnosis of SARS-CoV-2 by FDA under an Emergency Use Authorization (EUA). This EUA will remain in effect (meaning this test can be used) for the duration of the COVID-19 declaration under Section 564(b)(1) of the Act, 21 U.S.C. section 360bbb-3(b)(1), unless the authorization is terminated or revoked.     Resp Syncytial Virus by PCR NEGATIVE NEGATIVE Final    Comment: (NOTE) Fact Sheet for Patients: bloggercourse.com  Fact Sheet for Healthcare Providers: seriousbroker.it  This test is not yet approved or cleared by the [place]  FDA and has been authorized for detection and/or diagnosis of SARS-CoV-2 by FDA under an Emergency Use Authorization (EUA). This EUA will remain in effect (meaning this test can be used) for the duration of the COVID-19 declaration under Section 564(b)(1) of the Act, 21 U.S.C. section 360bbb-3(b)(1), unless the authorization is terminated or revoked.  Performed at [facility], [address] [place], [place] [address]   Blood Culture (routine x 2)     Status: None  (Preliminary result)   Collection Time: 07/20/24 11:48 AM   Specimen: BLOOD  Result Value Ref Range Status   Specimen Description   Final    BLOOD BLOOD LEFT ARM Performed at [facility], [address] [place], [place] [address]    Special Requests   Final    BOTTLES DRAWN AEROBIC AND ANAEROBIC Blood Culture adequate volume Performed at [facility], [address], [place], [place] [address]    Culture  Setup Time   Final    GRAM POSITIVE COCCI ANAEROBIC BOTTLE ONLY CRITICAL RESULT CALLED TO, READ BACK BY AND VERIFIED WITH:  JASON ROBBINS AT 9382 07/21/24 JG Performed at [facility], [address] [place], [place] [address]    Culture GRAM POSITIVE COCCI  Final   Report Status PENDING  Incomplete  Blood Culture ID Panel (Reflexed)     Status: Abnormal   Collection Time: 07/20/24 11:48 AM  Result Value Ref Range Status   Enterococcus faecalis NOT DETECTED NOT DETECTED Final   Enterococcus Faecium NOT DETECTED NOT DETECTED Final   Listeria monocytogenes NOT DETECTED NOT DETECTED Final   Staphylococcus species DETECTED (A) NOT DETECTED Final    Comment: CRITICAL RESULT CALLED TO, READ BACK BY AND VERIFIED WITH:  JASON ROBBINS AT 0617 07/21/24 JG    Staphylococcus aureus (BCID) NOT DETECTED NOT DETECTED Final   Staphylococcus epidermidis NOT DETECTED NOT DETECTED Final   Staphylococcus lugdunensis NOT DETECTED NOT DETECTED Final   Streptococcus species NOT DETECTED NOT DETECTED Final  Streptococcus agalactiae NOT DETECTED NOT DETECTED Final   Streptococcus pneumoniae NOT DETECTED NOT DETECTED Final   Streptococcus pyogenes NOT DETECTED NOT DETECTED Final   A.calcoaceticus-baumannii NOT DETECTED NOT DETECTED Final   Bacteroides fragilis NOT DETECTED NOT DETECTED Final   Enterobacterales NOT DETECTED NOT DETECTED Final   Enterobacter cloacae complex NOT DETECTED NOT DETECTED Final   Escherichia coli NOT DETECTED NOT DETECTED Final   Klebsiella  aerogenes NOT DETECTED NOT DETECTED Final   Klebsiella oxytoca NOT DETECTED NOT DETECTED Final   Klebsiella pneumoniae NOT DETECTED NOT DETECTED Final   Proteus species NOT DETECTED NOT DETECTED Final   Salmonella species NOT DETECTED NOT DETECTED Final   Serratia marcescens NOT DETECTED NOT DETECTED Final   Haemophilus influenzae NOT DETECTED NOT DETECTED Final   Neisseria meningitidis NOT DETECTED NOT DETECTED Final   Pseudomonas aeruginosa NOT DETECTED NOT DETECTED Final   Stenotrophomonas maltophilia NOT DETECTED NOT DETECTED Final   Candida albicans NOT DETECTED NOT DETECTED Final   Candida auris NOT DETECTED NOT DETECTED Final   Candida glabrata NOT DETECTED NOT DETECTED Final   Candida krusei NOT DETECTED NOT DETECTED Final   Candida parapsilosis NOT DETECTED NOT DETECTED Final   Candida tropicalis NOT DETECTED NOT DETECTED Final   Cryptococcus neoformans/gattii NOT DETECTED NOT DETECTED Final    Comment: Performed at [facility], [address] [place], [place] [address]  MRSA Next Gen by PCR, Nasal     Status: None   Collection Time: 07/20/24 10:09 PM   Specimen: Nasal Mucosa; Nasal Swab  Result Value Ref Range Status   MRSA by PCR Next Gen NOT DETECTED NOT DETECTED Final    Comment: (NOTE) The GeneXpert MRSA Assay (FDA approved for NASAL specimens only), is one component of a comprehensive MRSA colonization surveillance program. It is not intended to diagnose MRSA infection nor to guide or monitor treatment for MRSA infections. Test performance is not FDA approved in patients less than 54 years old. Performed at [facility], [address] [place], [place] [address]      Studies: CT ANGIO GI BLEED Result Date: 07/20/2024 EXAM: CTA ABDOMEN AND PELVIS WITH CONTRAST 07/20/2024 12:55:36 PM TECHNIQUE: CTA images of the abdomen and pelvis with 80 mL iohexol (OMNIPAQUE) 350 MG/ML injection 100 mL IOHEXOL 350 MG/ML SOLN intravenous contrast.  Three-dimensional MIP/volume rendered formations were performed. Automated exposure control, iterative reconstruction, and/or weight based adjustment of the mA/kV was utilized to reduce the radiation dose to as low as reasonably achievable. COMPARISON: CT abdomen/pelvis 07/18/2024, CT 03/18/2019. CLINICAL HISTORY: GI bleed, significantly elevated lactic, eval ischemia. FINDINGS: VASCULATURE: GI BLEED: No significant enhancement to suggest acute/active GI bleed. AORTA: There is moderate calcified plaque of the abdominal aorta which is normal in caliber. No abdominal aortic aneurysm. No dissection. CELIAC TRUNK: Calcified plaque at the origin of the celiac artery. No occlusion or significant stenosis. SUPERIOR MESENTERIC ARTERY: Calcified plaque at the origin of the superior mesenteric artery. No occlusion or significant stenosis. INFERIOR MESENTERIC ARTERY: No acute finding. No occlusion or significant stenosis. RENAL ARTERIES: Calcified plaque at the origin of the renal arteries which are otherwise patent. No occlusion or significant stenosis. ILIAC ARTERIES: No acute finding. No occlusion or significant stenosis. ABDOMEN/PELVIS: LOWER CHEST: Mild stable cardiomegaly. Calcified plaque over the visualized descending thoracic aorta. Visualized lung bases demonstrate no acute airspace process or effusion. There is a 7 mm nodule over the left lower lobe (image 3) unchanged from 2020 and therefore considered benign. LIVER: The liver  is unremarkable. GALLBLADDER AND BILE DUCTS: Previous cholecystectomy. No biliary ductal dilatation. SPLEEN: The spleen is unremarkable. PANCREAS: The pancreas is unremarkable. ADRENAL GLANDS: Bilateral adrenal glands demonstrate no acute abnormality. KIDNEYS, URETERS AND BLADDER: No stones in the kidneys or ureters. No hydronephrosis. No perinephric or periureteral stranding. Urinary bladder is unremarkable. GI AND BOWEL: There is wall thickening of the gastric antrum without adjacent  inflammatory change or fluid. These findings could be seen with gastritis. Moderate diverticulosis of the colon most prominent of the sigmoid colon without definite active inflammation. There is no bowel obstruction. No abnormal bowel wall thickening or distension. REPRODUCTIVE: Stable simple appearing 2.5 cm right ovarian cyst. PERITONEUM AND RETROPERITONEUM: No ascites or free air. LYMPH NODES: No lymphadenopathy. BONES AND SOFT TISSUES: T11 compression fracture unchanged. Mild loss of anterior vertebral body height of T12 unchanged. Posterior fusion hardware intact over the L4-L5 level with interbody fusion. Bilateral hip prostheses are otherwise unchanged. Note that there is moderate streak artifact over the pelvis due to bilateral hip prostheses. No acute soft tissue abnormality. IMPRESSION: 1. No active GI bleeding. 2. Wall thickening of the gastric antrum without adjacent inflammatory change or fluid as findings may be seen with gastritis. 3. Moderate diverticulosis of the colon, most prominent in the sigmoid colon, without active inflammation. 4. Aortic atherosclerosis. Electronically signed by: Toribio Agreste MD 07/20/2024 01:56 PM EST RP Workstation: [id number]   DG Chest Port 1 View Result Date: 07/20/2024 EXAM: 1 VIEW(S) XRAY OF THE CHEST 07/20/2024 11:20:00 AM COMPARISON: 07/17/2024 CLINICAL HISTORY: 89 year old female. Questionable sepsis - evaluate for abnormality. FINDINGS: LUNGS AND PLEURA: No focal pulmonary opacity. No pleural effusion. No pneumothorax. HEART AND MEDIASTINUM: Aortic atherosclerosis. No acute abnormality of the cardiac and mediastinal silhouettes. BONES AND SOFT TISSUES: Advanced degenerative changes in left shoulder. No acute osseous abnormality. IMPRESSION: 1. No acute cardiopulmonary abnormality. Electronically signed by: Helayne Hurst MD 07/20/2024 11:58 AM EST RP Workstation: [id number]      Vernal Alstrom, MD  [facility] 07/21/2024  If 7PM-7AM, please contact  night-coverage

## 2024-07-21 NOTE — Anesthesia Postprocedure Evaluation (Signed)
 Anesthesia Post Note  [name]  Procedure(s) Performed: EGD (ESOPHAGOGASTRODUODENOSCOPY) CONTROL OF HEMORRHAGE, GI TRACT, ENDOSCOPIC, BY CLIPPING OR OVERSEWING EGD, WITH ARGON PLASMA COAGULATION  Patient location during evaluation: PACU Anesthesia Type: General Level of consciousness: awake and alert Pain management: pain level controlled Vital Signs Assessment: post-procedure vital signs reviewed and stable Respiratory status: spontaneous breathing, nonlabored ventilation, respiratory function stable and patient connected to nasal cannula oxygen Cardiovascular status: blood pressure returned to baseline and stable Postop Assessment: no apparent nausea or vomiting Anesthetic complications: no   No notable events documented.   Last Vitals:  Vitals:   07/21/24 0500 07/21/24 0504  BP: (!) 86/63 (!) 115/49  Pulse: 83 87  Resp: (!) 38 18  Temp:    SpO2: 100% 98%    Last Pain:  Vitals:   07/21/24 0200  TempSrc: Axillary  PainSc:                  Prentice Murphy

## 2024-07-21 NOTE — Progress Notes (Signed)
 Alicia Copping, MD [facility]   [address] [place], [place] [address] Phone: [phone] Fax : [phone]   Subjective: This patient had a stat consult called yesterday evening and went straight to the endoscopy unit for an upper endoscopy without any active bleeding seen with a small AVM that was treated with cautery with a duodenal ulcer that was nonbleeding and a gastric ulcer with a small pigmented spot that was cauterized.  The patient's hemoglobin went from 6.3 to 7.5 yesterday and is 7.1 today.  It appears that the patient was ordered 2 units of blood but on review looks like she may have only gotten 1 unit.  She has had no signs of any continued GI bleeding and has had just a small amount of passage of dark material likely residual blood from her GI bleed.   Objective: Vital signs in last 24 hours: Vitals:   07/21/24 0500 07/21/24 0504 07/21/24 0600 07/21/24 0700  BP: (!) 86/63 (!) 115/49 (!) 114/50 102/60  Pulse: 83 87 97 100  Resp: 18 18 20  (!) 22  Temp:      TempSrc:      SpO2: 100% 98% 100% 100%  Weight: 52 kg     Height:       Weight change:   Intake/Output Summary (Last 24 hours) at 07/21/2024 0749 Last data filed at 07/21/2024 0600 Gross per 24 hour  Intake 2008 ml  Output 500 ml  Net 1508 ml     Exam: Heart:: Regular rate and rhythm or without murmur or extra heart sounds Lungs: normal and clear to auscultation and percussion Abdomen: soft, nontender, normal bowel sounds   Lab Results: @LABTEST2 @ Micro Results: Recent Results (from the past 240 hours)  Blood culture (routine x 2)     Status: None (Preliminary result)   Collection Time: 07/17/24  4:36 PM   Specimen: BLOOD  Result Value Ref Range Status   Specimen Description BLOOD BLOOD RIGHT FOREARM  Final   Special Requests   Final    BOTTLES DRAWN AEROBIC AND ANAEROBIC Blood Culture adequate volume   Culture   Final    NO GROWTH 3 DAYS Performed at [facility], [address] [place], [place] [address]    Report Status PENDING  Incomplete  Blood culture (routine x 2)     Status: None (Preliminary result)   Collection Time: 07/17/24  4:41 PM   Specimen: BLOOD  Result Value Ref Range Status   Specimen Description BLOOD LEFT ANTECUBITAL  Final   Special Requests   Final    BOTTLES DRAWN AEROBIC AND ANAEROBIC Blood Culture adequate volume   Culture   Final    NO GROWTH 3 DAYS Performed at [facility], [address], [place], [place] [address]    Report Status PENDING  Incomplete  Resp panel by RT-PCR (RSV, Flu A&B, Covid) Anterior Nasal Swab     Status: None   Collection Time: 07/20/24 11:23 AM   Specimen: Anterior Nasal Swab  Result Value Ref Range Status   SARS Coronavirus 2 by RT PCR NEGATIVE NEGATIVE Final    Comment: (NOTE) SARS-CoV-2 target nucleic acids are NOT DETECTED.  The SARS-CoV-2 RNA is generally detectable in upper respiratory specimens during the acute phase of infection. The lowest concentration of SARS-CoV-2 viral copies this assay can detect is 138 copies/mL. A negative result does not preclude SARS-Cov-2 infection and should not be used as the sole basis for treatment or other patient  management decisions. A negative result may occur with  improper specimen collection/handling, submission of specimen other than nasopharyngeal swab, presence of viral mutation(s) within the areas targeted by this assay, and inadequate number of viral copies(<138 copies/mL). A negative result must be combined with clinical observations, patient history, and epidemiological information. The expected result is Negative.  Fact Sheet for Patients:  bloggercourse.com  Fact Sheet for Healthcare Providers:  seriousbroker.it  This test is no t yet approved or cleared by the [place]  FDA and  has been authorized for detection and/or diagnosis of SARS-CoV-2 by FDA under an Emergency Use  Authorization (EUA). This EUA will remain  in effect (meaning this test can be used) for the duration of the COVID-19 declaration under Section 564(b)(1) of the Act, 21 U.S.C.section 360bbb-3(b)(1), unless the authorization is terminated  or revoked sooner.       Influenza A by PCR NEGATIVE NEGATIVE Final   Influenza B by PCR NEGATIVE NEGATIVE Final    Comment: (NOTE) The Xpert Xpress SARS-CoV-2/FLU/RSV plus assay is intended as an aid in the diagnosis of influenza from Nasopharyngeal swab specimens and should not be used as a sole basis for treatment. Nasal washings and aspirates are unacceptable for Xpert Xpress SARS-CoV-2/FLU/RSV testing.  Fact Sheet for Patients: bloggercourse.com  Fact Sheet for Healthcare Providers: seriousbroker.it  This test is not yet approved or cleared by the [place]  FDA and has been authorized for detection and/or diagnosis of SARS-CoV-2 by FDA under an Emergency Use Authorization (EUA). This EUA will remain in effect (meaning this test can be used) for the duration of the COVID-19 declaration under Section 564(b)(1) of the Act, 21 U.S.C. section 360bbb-3(b)(1), unless the authorization is terminated or revoked.     Resp Syncytial Virus by PCR NEGATIVE NEGATIVE Final    Comment: (NOTE) Fact Sheet for Patients: bloggercourse.com  Fact Sheet for Healthcare Providers: seriousbroker.it  This test is not yet approved or cleared by the [place]  FDA and has been authorized for detection and/or diagnosis of SARS-CoV-2 by FDA under an Emergency Use Authorization (EUA). This EUA will remain in effect (meaning this test can be used) for the duration of the COVID-19 declaration under Section 564(b)(1) of the Act, 21 U.S.C. section 360bbb-3(b)(1), unless the authorization is terminated or revoked.  Performed at [facility], [address] [place], [place] [address]   Blood Culture (routine x 2)     Status: None (Preliminary result)   Collection Time: 07/20/24 11:48 AM   Specimen: BLOOD  Result Value Ref Range Status   Specimen Description BLOOD BLOOD LEFT ARM  Final   Special Requests   Final    BOTTLES DRAWN AEROBIC AND ANAEROBIC Blood Culture adequate volume   Culture  Setup Time   Final    GRAM POSITIVE COCCI ANAEROBIC BOTTLE ONLY Organism ID to follow CRITICAL RESULT CALLED TO, READ BACK BY AND VERIFIED WITHBETHA SELINDA SIMPERS AT 9382 07/21/24 JG Performed at [facility], [address], [place], [place] [address]    Culture PENDING  Incomplete   Report Status PENDING  Incomplete  Blood Culture ID Panel (Reflexed)     Status: Abnormal   Collection Time: 07/20/24 11:48 AM  Result Value Ref Range Status   Enterococcus faecalis NOT DETECTED NOT DETECTED Final   Enterococcus Faecium NOT DETECTED NOT DETECTED Final   Listeria monocytogenes NOT DETECTED NOT DETECTED Final   Staphylococcus species DETECTED (A) NOT DETECTED Final    Comment: CRITICAL RESULT CALLED TO,  READ BACK BY AND VERIFIED WITH:  JASON ROBBINS AT 9382 07/21/24 JG    Staphylococcus aureus (BCID) NOT DETECTED NOT DETECTED Final   Staphylococcus epidermidis NOT DETECTED NOT DETECTED Final   Staphylococcus lugdunensis NOT DETECTED NOT DETECTED Final   Streptococcus species NOT DETECTED NOT DETECTED Final   Streptococcus agalactiae NOT DETECTED NOT DETECTED Final   Streptococcus pneumoniae NOT DETECTED NOT DETECTED Final   Streptococcus pyogenes NOT DETECTED NOT DETECTED Final   A.calcoaceticus-baumannii NOT DETECTED NOT DETECTED Final   Bacteroides fragilis NOT DETECTED NOT DETECTED Final   Enterobacterales NOT DETECTED NOT DETECTED Final   Enterobacter cloacae complex NOT DETECTED NOT DETECTED Final   Escherichia coli NOT DETECTED NOT DETECTED Final   Klebsiella aerogenes NOT DETECTED NOT DETECTED Final   Klebsiella oxytoca NOT  DETECTED NOT DETECTED Final   Klebsiella pneumoniae NOT DETECTED NOT DETECTED Final   Proteus species NOT DETECTED NOT DETECTED Final   Salmonella species NOT DETECTED NOT DETECTED Final   Serratia marcescens NOT DETECTED NOT DETECTED Final   Haemophilus influenzae NOT DETECTED NOT DETECTED Final   Neisseria meningitidis NOT DETECTED NOT DETECTED Final   Pseudomonas aeruginosa NOT DETECTED NOT DETECTED Final   Stenotrophomonas maltophilia NOT DETECTED NOT DETECTED Final   Candida albicans NOT DETECTED NOT DETECTED Final   Candida auris NOT DETECTED NOT DETECTED Final   Candida glabrata NOT DETECTED NOT DETECTED Final   Candida krusei NOT DETECTED NOT DETECTED Final   Candida parapsilosis NOT DETECTED NOT DETECTED Final   Candida tropicalis NOT DETECTED NOT DETECTED Final   Cryptococcus neoformans/gattii NOT DETECTED NOT DETECTED Final    Comment: Performed at [facility], [address] [place], [place] [address]  MRSA Next Gen by PCR, Nasal     Status: None   Collection Time: 07/20/24 10:09 PM   Specimen: Nasal Mucosa; Nasal Swab  Result Value Ref Range Status   MRSA by PCR Next Gen NOT DETECTED NOT DETECTED Final    Comment: (NOTE) The GeneXpert MRSA Assay (FDA approved for NASAL specimens only), is one component of a comprehensive MRSA colonization surveillance program. It is not intended to diagnose MRSA infection nor to guide or monitor treatment for MRSA infections. Test performance is not FDA approved in patients less than 15 years old. Performed at [facility], [address] [place], [place] [address]    Studies/Results: CT ANGIO GI BLEED Result Date: 07/20/2024 EXAM: CTA ABDOMEN AND PELVIS WITH CONTRAST 07/20/2024 12:55:36 PM TECHNIQUE: CTA images of the abdomen and pelvis with 80 mL iohexol (OMNIPAQUE) 350 MG/ML injection 100 mL IOHEXOL 350 MG/ML SOLN intravenous contrast. Three-dimensional MIP/volume rendered formations were performed. Automated  exposure control, iterative reconstruction, and/or weight based adjustment of the mA/kV was utilized to reduce the radiation dose to as low as reasonably achievable. COMPARISON: CT abdomen/pelvis 07/18/2024, CT 03/18/2019. CLINICAL HISTORY: GI bleed, significantly elevated lactic, eval ischemia. FINDINGS: VASCULATURE: GI BLEED: No significant enhancement to suggest acute/active GI bleed. AORTA: There is moderate calcified plaque of the abdominal aorta which is normal in caliber. No abdominal aortic aneurysm. No dissection. CELIAC TRUNK: Calcified plaque at the origin of the celiac artery. No occlusion or significant stenosis. SUPERIOR MESENTERIC ARTERY: Calcified plaque at the origin of the superior mesenteric artery. No occlusion or significant stenosis. INFERIOR MESENTERIC ARTERY: No acute finding. No occlusion or significant stenosis. RENAL ARTERIES: Calcified plaque at the origin of the renal arteries which are otherwise patent. No occlusion or significant stenosis. ILIAC ARTERIES: No acute finding. No  occlusion or significant stenosis. ABDOMEN/PELVIS: LOWER CHEST: Mild stable cardiomegaly. Calcified plaque over the visualized descending thoracic aorta. Visualized lung bases demonstrate no acute airspace process or effusion. There is a 7 mm nodule over the left lower lobe (image 3) unchanged from 2020 and therefore considered benign. LIVER: The liver is unremarkable. GALLBLADDER AND BILE DUCTS: Previous cholecystectomy. No biliary ductal dilatation. SPLEEN: The spleen is unremarkable. PANCREAS: The pancreas is unremarkable. ADRENAL GLANDS: Bilateral adrenal glands demonstrate no acute abnormality. KIDNEYS, URETERS AND BLADDER: No stones in the kidneys or ureters. No hydronephrosis. No perinephric or periureteral stranding. Urinary bladder is unremarkable. GI AND BOWEL: There is wall thickening of the gastric antrum without adjacent inflammatory change or fluid. These findings could be seen with gastritis.  Moderate diverticulosis of the colon most prominent of the sigmoid colon without definite active inflammation. There is no bowel obstruction. No abnormal bowel wall thickening or distension. REPRODUCTIVE: Stable simple appearing 2.5 cm right ovarian cyst. PERITONEUM AND RETROPERITONEUM: No ascites or free air. LYMPH NODES: No lymphadenopathy. BONES AND SOFT TISSUES: T11 compression fracture unchanged. Mild loss of anterior vertebral body height of T12 unchanged. Posterior fusion hardware intact over the L4-L5 level with interbody fusion. Bilateral hip prostheses are otherwise unchanged. Note that there is moderate streak artifact over the pelvis due to bilateral hip prostheses. No acute soft tissue abnormality. IMPRESSION: 1. No active GI bleeding. 2. Wall thickening of the gastric antrum without adjacent inflammatory change or fluid as findings may be seen with gastritis. 3. Moderate diverticulosis of the colon, most prominent in the sigmoid colon, without active inflammation. 4. Aortic atherosclerosis. Electronically signed by: Toribio Agreste MD 07/20/2024 01:56 PM EST RP Workstation: [id number]   DG Chest Port 1 View Result Date: 07/20/2024 EXAM: 1 VIEW(S) XRAY OF THE CHEST 07/20/2024 11:20:00 AM COMPARISON: 07/17/2024 CLINICAL HISTORY: 89 year old female. Questionable sepsis - evaluate for abnormality. FINDINGS: LUNGS AND PLEURA: No focal pulmonary opacity. No pleural effusion. No pneumothorax. HEART AND MEDIASTINUM: Aortic atherosclerosis. No acute abnormality of the cardiac and mediastinal silhouettes. BONES AND SOFT TISSUES: Advanced degenerative changes in left shoulder. No acute osseous abnormality. IMPRESSION: 1. No acute cardiopulmonary abnormality. Electronically signed by: Helayne Hurst MD 07/20/2024 11:58 AM EST RP Workstation: [id number]   Medications: I have reviewed the patient's current medications. Scheduled Meds:  sodium chloride    Intravenous Once   Chlorhexidine Gluconate Cloth  6 each  Topical Daily   insulin  aspart  0-9 Units Subcutaneous Q4H   pantoprazole (PROTONIX) IV  40 mg Intravenous Q12H   sodium chloride  flush  3 mL Intravenous Q12H   Continuous Infusions: PRN Meds:.acetaminophen  **OR** acetaminophen , mouth rinse, senna-docusate   Assessment: Principal Problem:   Acute blood loss anemia (ABLA) Active Problems:   Essential hypertension   Mixed hyperlipidemia   PAD (peripheral artery disease)   CKD (chronic kidney disease) stage 3, GFR 30-59 ml/min (HCC)   Leukocytosis   Dementia without behavioral disturbance (HCC)   Blood in stool   Duodenal ulcer   Acute gastric ulcer    Plan: This patient is status post EGD with a clean-based duodenal ulcer and a gastric ulcer with a pigmented spot that was cauterized.  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  The patient has a hemoglobin that appears to have dropped likely from equilibration and hydration.  The patient should be treated with a PPI and watch for any further bleeding and transfuse as necessary.  The stomach ulcer had a clip placed on it  so that if there is any further bleeding the patient should have vascular surgery or interventional radiology attempt embolization at that time.   LOS: 1 day   Alicia Copping, MD.FACG 07/21/2024, 7:49 AM Pager [phone] 7am-5pm  Check AMION for 5pm -7am coverage and on weekends

## 2024-07-22 DIAGNOSIS — D62 Acute posthemorrhagic anemia: Secondary | ICD-10-CM | POA: Diagnosis not present

## 2024-07-22 LAB — TYPE AND SCREEN
ABO/RH(D): O POS
Antibody Screen: NEGATIVE
Unit division: 0
Unit division: 0

## 2024-07-22 LAB — HEMOGLOBIN AND HEMATOCRIT, BLOOD
HCT: 25.2 % — ABNORMAL LOW (ref 36.0–46.0)
HCT: 26.2 % — ABNORMAL LOW (ref 36.0–46.0)
Hemoglobin: 8.3 g/dL — ABNORMAL LOW (ref 12.0–15.0)
Hemoglobin: 8.7 g/dL — ABNORMAL LOW (ref 12.0–15.0)

## 2024-07-22 LAB — BASIC METABOLIC PANEL WITH GFR
Anion gap: 5 (ref 5–15)
BUN: 33 mg/dL — ABNORMAL HIGH (ref 8–23)
CO2: 25 mmol/L (ref 22–32)
Calcium: 8.7 mg/dL — ABNORMAL LOW (ref 8.9–10.3)
Chloride: 109 mmol/L (ref 98–111)
Creatinine, Ser: 0.93 mg/dL (ref 0.44–1.00)
GFR, Estimated: 58 mL/min — ABNORMAL LOW (ref >60)
Glucose, Bld: 92 mg/dL (ref 70–99)
Potassium: 3.3 mmol/L — ABNORMAL LOW (ref 3.5–5.1)
Sodium: 139 mmol/L (ref 135–145)

## 2024-07-22 LAB — BPAM RBC
Blood Product Expiration Date: 202601042359
Blood Product Expiration Date: 202601062359
ISSUE DATE / TIME: 202512031624
ISSUE DATE / TIME: 202512042039
Unit Type and Rh: 5100
Unit Type and Rh: 5100

## 2024-07-22 LAB — CULTURE, BLOOD (ROUTINE X 2)
Culture: NO GROWTH
Culture: NO GROWTH
Special Requests: ADEQUATE
Special Requests: ADEQUATE

## 2024-07-22 LAB — GLUCOSE, CAPILLARY
Glucose-Capillary: 85 mg/dL (ref 70–99)
Glucose-Capillary: 86 mg/dL (ref 70–99)
Glucose-Capillary: 86 mg/dL (ref 70–99)
Glucose-Capillary: 94 mg/dL (ref 70–99)

## 2024-07-22 LAB — PHOSPHORUS: Phosphorus: 2.7 mg/dL (ref 2.5–4.6)

## 2024-07-22 LAB — MAGNESIUM: Magnesium: 2 mg/dL (ref 1.7–2.4)

## 2024-07-22 MED ORDER — PANTOPRAZOLE SODIUM 40 MG PO TBEC: 40.0000 mg | DELAYED_RELEASE_TABLET | Freq: Two times a day (BID) | ORAL | Status: DC

## 2024-07-22 MED ORDER — SERTRALINE HCL 50 MG PO TABS
25.0000 mg | ORAL_TABLET | Freq: Every day | ORAL | Status: DC
  Administered 2024-07-22: 25 mg via ORAL
  Filled 2024-07-22: qty 1

## 2024-07-22 MED ORDER — PANTOPRAZOLE SODIUM 40 MG PO TBEC: 40.0000 mg | DELAYED_RELEASE_TABLET | Freq: Two times a day (BID) | ORAL | 2 refills | Status: DC

## 2024-07-22 MED ORDER — VITAMIN B-12 1000 MCG PO TABS
1000.0000 ug | ORAL_TABLET | Freq: Every day | ORAL | Status: DC
  Administered 2024-07-22: 1000 ug via ORAL
  Filled 2024-07-22: qty 1

## 2024-07-22 MED ORDER — VITAMIN D 25 MCG (1000 UNIT) PO TABS
1000.0000 [IU] | ORAL_TABLET | ORAL | Status: DC
  Administered 2024-07-22: 1000 [IU] via ORAL
  Filled 2024-07-22: qty 1

## 2024-07-22 MED ORDER — POTASSIUM CHLORIDE CRYS ER 20 MEQ PO TBCR
20.0000 meq | EXTENDED_RELEASE_TABLET | Freq: Once | ORAL | Status: AC
  Administered 2024-07-22: 20 meq via ORAL
  Filled 2024-07-22: qty 1

## 2024-07-22 MED ORDER — ATORVASTATIN CALCIUM 10 MG PO TABS
10.0000 mg | ORAL_TABLET | Freq: Every day | ORAL | Status: DC
  Administered 2024-07-22: 10 mg via ORAL
  Filled 2024-07-22: qty 1

## 2024-07-22 NOTE — Care Management Important Message (Signed)
 Important Message  Patient Details  Name: Alicia Mccann MRN: [id number] Date of Birth: 17-Jun-1933   Important Message Given:  Yes - Medicare IM     Micah Galeno W, CMA 07/22/2024, 12:44 PM

## 2024-07-22 NOTE — Plan of Care (Signed)
  Problem: Health Behavior/Discharge Planning: Goal: Ability to identify and utilize available resources and services will improve Outcome: Progressing   Problem: Tissue Perfusion: Goal: Adequacy of tissue perfusion will improve Outcome: Progressing   Problem: Clinical Measurements: Goal: Cardiovascular complication will be avoided Outcome: Progressing   Problem: Pain Managment: Goal: General experience of comfort will improve and/or be controlled Outcome: Progressing

## 2024-07-22 NOTE — Evaluation (Signed)
 Physical Therapy Evaluation Patient Details Name: Alicia Mccann MRN: [id number] DOB: 30-Nov-1932 Today's Date: 07/22/2024  History of Present Illness  presented to ER secondary to black stool, SOB; admitted fro management of ABLA secondary to upper GIB.  EGD completed on 12/3 significant for PUD.  Of note, patient with recent admission (discharged 12/2) secondary to rhabdomyolysis after all in the home  Clinical Impression  Patient resting in bed upon arrival to room; alert and oriented to self, location as hospital only. Unaware of reason for admission.  Generally perseverative on discharge home and wanting to call daughter (frustrated with inability to remember number).  Redirectable, but requires such on multiple and frequent episodes during session. Generally weak and deconditioned throughout all extremities; chronic arthritic changes to bilat shoulders evident. No specific, focal weakness appreciated.  Does endorse generalized soreness to back/buttocks; improved with change of position and OOB.  Currently requiring min/mod assist for bed mobility; min/mod assist (progressing to min assist) for sit/stand, basic transfers and gait (180') with RW.  Demonstrates forward flexed posture with RW arms length anterior to patient; short, shuffling steps with decreased cadence. Highly distractible by external environment, often ceasing gait to participate with conversation/environment throughout distance. Does temporarily improve step height/length and walker position with constant cuing, but unable to maintain indep.  Recommend use of RW and +1 assist for all mobility tasks at this time.   Would benefit from skilled PT to address above deficits and promote optimal return to PLOF.; recommend post-acute PT follow up as indicated by interdisciplinary care team.            If plan is discharge home, recommend the following: A little help with walking and/or transfers;A little help with  bathing/dressing/bathroom;Assist for transportation;Help with stairs or ramp for entrance;Supervision due to cognitive status;Assistance with cooking/housework   Can travel by private vehicle        Equipment Recommendations None recommended by PT  Recommendations for Other Services       Functional Status Assessment       Precautions / Restrictions Precautions Precautions: Fall Recall of Precautions/Restrictions: Impaired Restrictions Weight Bearing Restrictions Per Provider Order: No      Mobility  Bed Mobility Overal bed mobility: Needs Assistance Bed Mobility: Supine to Sit     Supine to sit: Min assist, Mod assist     General bed mobility comments: seated in recliner end of session    Transfers Overall transfer level: Needs assistance Equipment used: Rolling walker (2 wheels) Transfers: Sit to/from Stand, Bed to chair/wheelchair/BSC Sit to Stand: Min assist           General transfer comment: cuing for hand placement; assist for walker position/management    Ambulation/Gait Ambulation/Gait assistance: Min assist, Contact guard assist Gait Distance (Feet): 180 Feet Assistive device: Rolling walker (2 wheels)         General Gait Details: forward flexed posture with RW arms length anterior to patient; short, shuffling steps with decreased cadence. Highly distractible by external environment, often ceasing gait to participate with conversation/environment throughout distance.  Does temporarily improve step height/length and walker position with constant cuing, but unable to maintain indep  Stairs            Wheelchair Mobility     Tilt Bed    Modified Rankin (Stroke Patients Only)       Balance Overall balance assessment: Needs assistance Sitting-balance support: No upper extremity supported, Feet supported Sitting balance-Leahy Scale: Good     Standing balance  support: Bilateral upper extremity supported Standing balance-Leahy Scale:  Poor                               Pertinent Vitals/Pain Pain Assessment Pain Assessment: Faces Faces Pain Scale: Hurts a little bit Pain Location: buttocks/sacrum Pain Descriptors / Indicators: Sore Pain Intervention(s): Limited activity within patient's tolerance, Monitored during session, Repositioned    Home Living Family/patient expects to be discharged to:: Private residence Living Arrangements: Alone Available Help at Discharge: Personal care attendant;Available PRN/intermittently Type of Home: House Home Access: Stairs to enter Entrance Stairs-Rails: Right;Left Entrance Stairs-Number of Steps: 2   Home Layout: One level Home Equipment: Agricultural Consultant (2 wheels);Cane - single point      Prior Function Prior Level of Function : Independent/Modified Independent             Mobility Comments: Per daughter, ambulatory with SPC (does have RW) in the home.  Endorses history of falls.  Has caregiver 9-2 and 2-bedtime, 7 days/week.       Extremity/Trunk Assessment   Upper Extremity Assessment Upper Extremity Assessment: Generalized weakness (grossly 3-/5 bilat shoulders, chronic arthritic changes; elbows and wrists grossly 4-/5)    Lower Extremity Assessment Lower Extremity Assessment: Generalized weakness (grossly 4-/5 throughout)       Communication   Communication Communication: No apparent difficulties Factors Affecting Communication: Hearing impaired    Cognition Arousal: Alert Behavior During Therapy: Restless, Anxious (generally perseverative on discharge home)   PT - Cognitive impairments: History of cognitive impairments, Memory, Safety/Judgement                       PT - Cognition Comments: oriented to self and location as hospital; perseverative on goal/desire of discharge to home, but easily redirectable.  Poor recall of information, often repeating self multiple times throughout session Following commands:  Impaired Following commands impaired: Follows one step commands inconsistently     Cueing Cueing Techniques: Verbal cues, Tactile cues     General Comments      Exercises     Assessment/Plan    PT Assessment Patient needs continued PT services  PT Problem List Decreased strength;Decreased range of motion;Decreased activity tolerance;Decreased balance;Decreased mobility;Decreased cognition;Decreased knowledge of use of DME;Decreased safety awareness       PT Treatment Interventions DME instruction;Gait training;Stair training;Functional mobility training;Therapeutic activities;Therapeutic exercise;Balance training;Neuromuscular re-education;Cognitive remediation;Patient/family education;Wheelchair mobility training    PT Goals (Current goals can be found in the Care Plan section)  Acute Rehab PT Goals Patient Stated Goal: to go home today! PT Goal Formulation: With patient Time For Goal Achievement: 08/05/24 Potential to Achieve Goals: Fair    Frequency Min 2X/week     Co-evaluation               AM-PAC PT 6 Clicks Mobility  Outcome Measure Help needed turning from your back to your side while in a flat bed without using bedrails?: None Help needed moving from lying on your back to sitting on the side of a flat bed without using bedrails?: A Lot Help needed moving to and from a bed to a chair (including a wheelchair)?: A Little Help needed standing up from a chair using your arms (e.g., wheelchair or bedside chair)?: A Little Help needed to walk in hospital room?: A Little Help needed climbing 3-5 steps with a railing? : A Lot 6 Click Score: 17    End of Session  Activity Tolerance: Patient tolerated treatment well Patient left: in chair;with call bell/phone within reach;with chair alarm set;with family/visitor present Nurse Communication: Mobility status PT Visit Diagnosis: Muscle weakness (generalized) (M62.81);Unsteadiness on feet (R26.81)    Time:  8780-8742 PT Time Calculation (min) (ACUTE ONLY): 38 min   Charges:   PT Evaluation $PT Eval Moderate Complexity: 1 Mod   PT General Charges $$ ACUTE PT VISIT: 1 Visit        Dajon Lazar H. Delores, PT, DPT, NCS 07/22/24, 4:24 PM [phone]

## 2024-07-22 NOTE — TOC Transition Note (Signed)
 Transition of Care [facility]) - Discharge Note   Patient Details  Name: Alicia Mccann MRN: [id number] Date of Birth: 26-Dec-1932  Transition of Care [facility]) CM/SW Contact:  Shasta DELENA Daring, RN Phone Number: 07/22/2024, 2:14 PM   Clinical Narrative:    Patient has ordres to discharge home today. Set up with [facility] last admission. Confirmed they can still accept her for PT, OT, Nurse, Aide.   No further TOC concerns. RNCM signing off.   Final next level of care: Home w Home Health Services Barriers to Discharge: No Barriers Identified   Patient Goals and CMS Choice            Discharge Placement                    Patient and family notified of of transfer: 07/22/24  Discharge Plan and Services Additional resources added to the After Visit Summary for                            [facility] Arranged: PT, OT, Nurse's Aide, RN [facility] Agency: [facility] Date HH Agency Contacted: 07/22/24   Representative spoke with at [facility] Agency: Leita Goodness  Social Drivers of Health (SDOH) Interventions SDOH Screenings   Food Insecurity: No Food Insecurity (07/20/2024)  Housing: Patient Unable To Answer (07/20/2024)  Transportation Needs: Patient Unable To Answer (07/20/2024)  Utilities: Patient Unable To Answer (07/20/2024)  Depression (PHQ2-9): Medium Risk (12/24/2023)  Financial Resource Strain: Low Risk  (06/13/2024)   Received from [facility]  Physical Activity: Insufficiently Active (10/04/2021)  Social Connections: Patient Unable To Answer (07/20/2024)  Stress: No Stress Concern Present (10/04/2021)  Tobacco Use: Low Risk  (07/20/2024)     Readmission Risk Interventions     No data to display

## 2024-07-22 NOTE — Progress Notes (Signed)
 This patient is status post EGD with peptic ulcer disease found.  The patient's hemoglobin is up to 8.3 and we are not planning to do any further intervention on her at this time.  The patient is having her diet advanced.  I will sign off.  Please call if any further GI concerns or questions.  We would like to thank you for the opportunity to participate in the care of Alicia Mccann.

## 2024-07-22 NOTE — Discharge Summary (Signed)
 Physician Discharge Summary   [name] MRN: [id number] DOB: 1933/07/05  Admit date:     07/20/2024  Discharge date: 07/22/24  Discharge Physician: Leita Blanch   PCP: Glendia Shad, MD   Recommendations at discharge:   follow-up PCP Dr. Glendia in one week and get CBC checked follow-up G.I. Dr.wohl in 2-3 weeks  Discharge Diagnoses: Principal Problem:   Acute blood loss anemia (ABLA) Active Problems:   Dementia without behavioral disturbance (HCC)   Leukocytosis   Essential hypertension   Mixed hyperlipidemia   PAD (peripheral artery disease)   CKD (chronic kidney disease) stage 3, GFR 30-59 ml/min (HCC)   Blood in stool   Duodenal ulcer   Acute gastric ulcer Alicia Mccann is a 89 y.o. year old female with past medical history of hypertension, hyperlipidemia, CKD stage IIIa, dementia, manic depressive disorder, generalized anxiety disorder presented to the hospital with worsening shortness of breath.  Of note patient was recently discharged from the hospital 1 day prior to presentation after being admitted for rhabdomyolysis.  At this time, patient presented with black bowel movements and shortness of breath.  In the ED, patient was hemodynamically stable.  CBC showed leukocytosis at 21K with hemoglobin dropped at 7.6 from 10, 1 day back.   Troponins were elevated which were downtrending.  INR of 1.1.   Chest x-ray was negative.    CTA GI bleed was negative for acute bleed.    GI was consulted and patient was admitted hospital for further evaluation and treatment      Acute symptomatic blood loss anemia secondary to upper GI bleed. --Patient was seen by GI and underwent EGD on 07/20/2024 with findings of nonbleeding gastric ulcer with adherent clot which was treated with APC and 2 bleeding angioplastic lesions in the duodenum were treated with APC as well.   --Patient also had nonbleeding duodenal ulcer.  Continue PPI.  Hemoglobin today at 7.1.  Transfused 2  units--hgb 8.3 -- no issues per patient today. She is very eager to go home. She tells me she has to weights that help her out. She does not want to stay in the hospital. Overall tolerating clear liquid advance to full liquid for now. I have left message for patient's daughter [name]. --resume asa 81 mg in 1 week --f/u CBC with PCP in 1 week   Staph bacteremia. --Likely contaminant.  Not on antibiotics.  Will continue to monitor closely.     Leukocytosis: WBC today at 19.1 from initial 21.2.[name]  Chest x-ray was negative for any infiltrate.  Urinalysis negative for infection temperature of 99.1 degrees. ---no fever, clinically stable --wbc trending dow slowly--pt will f/u CBC as out pt   Lactic acidosis: Received IV fluids with improvement in lactate.   Type 2 diabetes: Continue sliding scale insulin    Hypokalemia. Gave po Kdur   Dementia: Continue delirium precautions.   MDD/GAD: On sertraline  at home.   Hypertension:   On amlodipine  at home.   Hyperlipidemia:  On Lipitor at home.      PT/OT to see pt --HH arranged  D/c plans d/w dter at bedside  DVT prophylaxis: SCDs Start: 07/20/24 1631        Consultants: GI Procedures performed: EGD  Disposition: Home Diet recommendation:  Discharge Diet Orders (From admission, onward)     Start     Ordered   07/22/24 0000  Diet - low sodium heart healthy        07/22/24 1120  07/22/24 0000  Diet Carb Modified        07/22/24 1120           Cardiac diet DISCHARGE MEDICATION: Allergies as of 07/22/2024       Reactions   Clindamycin /lincomycin    Egg Protein (egg White)    Egg Protein-containing Drug Products    Flagyl  [metronidazole ]    Levaquin [levofloxacin]    Mobic  [meloxicam ]    GI issues    Penicillins    Sulfa Antibiotics         Medication List     PAUSE taking these medications    aspirin  EC 81 MG tablet Wait to take this until: July 29, 2024 Take 81 mg by mouth daily. Swallow whole.        TAKE these medications    acetaminophen  500 MG tablet Commonly known as: TYLENOL  Take 1,000 mg by mouth every 8 (eight) hours as needed for mild pain or moderate pain.   amLODipine  5 MG tablet Commonly known as: NORVASC  Take 1 tablet (5 mg total) by mouth daily.   atorvastatin  10 MG tablet Commonly known as: LIPITOR Take 1 tablet (10 mg total) by mouth daily.   cholecalciferol  1000 units tablet Commonly known as: VITAMIN D  Take 1,000 Units by mouth every other day.   cyanocobalamin  1000 MCG tablet Commonly known as: VITAMIN B12 Take 1,000 mcg by mouth daily.   lidocaine  5 % Commonly known as: LIDODERM  Place 1 patch onto the skin daily. Remove & Discard patch within 12 hours or as directed by MD   pantoprazole  40 MG tablet Commonly known as: PROTONIX  Take 1 tablet (40 mg total) by mouth 2 (two) times daily before a meal.   sertraline  25 MG tablet Commonly known as: ZOLOFT  Take 1 tablet (25 mg total) by mouth daily.   traMADol 50 MG tablet Commonly known as: ULTRAM Take 50 mg by mouth.        Follow-up Information     Glendia Shad, MD. Schedule an appointment as soon as possible for a visit in 1 week(s).   Specialty: Internal Medicine Why: for hospital f/u and Lab work (CBC) Contact information: [address] [place] [address] [phone]                    Condition at discharge: fair  The results of significant diagnostics from this hospitalization (including imaging, microbiology, ancillary and laboratory) are listed below for reference.   Imaging Studies: CT ANGIO GI BLEED Result Date: 07/20/2024 EXAM: CTA ABDOMEN AND PELVIS WITH CONTRAST 07/20/2024 12:55:36 PM TECHNIQUE: CTA images of the abdomen and pelvis with 80 mL iohexol  (OMNIPAQUE ) 350 MG/ML injection 100 mL IOHEXOL  350 MG/ML SOLN intravenous contrast. Three-dimensional MIP/volume rendered formations were performed. Automated exposure control,  iterative reconstruction, and/or weight based adjustment of the mA/kV was utilized to reduce the radiation dose to as low as reasonably achievable. COMPARISON: CT abdomen/pelvis 07/18/2024, CT 03/18/2019. CLINICAL HISTORY: GI bleed, significantly elevated lactic, eval ischemia. FINDINGS: VASCULATURE: GI BLEED: No significant enhancement to suggest acute/active GI bleed. AORTA: There is moderate calcified plaque of the abdominal aorta which is normal in caliber. No abdominal aortic aneurysm. No dissection. CELIAC TRUNK: Calcified plaque at the origin of the celiac artery. No occlusion or significant stenosis. SUPERIOR MESENTERIC ARTERY: Calcified plaque at the origin of the superior mesenteric artery. No occlusion or significant stenosis. INFERIOR MESENTERIC ARTERY: No acute finding. No occlusion or significant stenosis. RENAL ARTERIES: Calcified plaque at the origin  of the renal arteries which are otherwise patent. No occlusion or significant stenosis. ILIAC ARTERIES: No acute finding. No occlusion or significant stenosis. ABDOMEN/PELVIS: LOWER CHEST: Mild stable cardiomegaly. Calcified plaque over the visualized descending thoracic aorta. Visualized lung bases demonstrate no acute airspace process or effusion. There is a 7 mm nodule over the left lower lobe (image 3) unchanged from 2020 and therefore considered benign. LIVER: The liver is unremarkable. GALLBLADDER AND BILE DUCTS: Previous cholecystectomy. No biliary ductal dilatation. SPLEEN: The spleen is unremarkable. PANCREAS: The pancreas is unremarkable. ADRENAL GLANDS: Bilateral adrenal glands demonstrate no acute abnormality. KIDNEYS, URETERS AND BLADDER: No stones in the kidneys or ureters. No hydronephrosis. No perinephric or periureteral stranding. Urinary bladder is unremarkable. GI AND BOWEL: There is wall thickening of the gastric antrum without adjacent inflammatory change or fluid. These findings could be seen with gastritis. Moderate diverticulosis  of the colon most prominent of the sigmoid colon without definite active inflammation. There is no bowel obstruction. No abnormal bowel wall thickening or distension. REPRODUCTIVE: Stable simple appearing 2.5 cm right ovarian cyst. PERITONEUM AND RETROPERITONEUM: No ascites or free air. LYMPH NODES: No lymphadenopathy. BONES AND SOFT TISSUES: T11 compression fracture unchanged. Mild loss of anterior vertebral body height of T12 unchanged. Posterior fusion hardware intact over the L4-L5 level with interbody fusion. Bilateral hip prostheses are otherwise unchanged. Note that there is moderate streak artifact over the pelvis due to bilateral hip prostheses. No acute soft tissue abnormality. IMPRESSION: 1. No active GI bleeding. 2. Wall thickening of the gastric antrum without adjacent inflammatory change or fluid as findings may be seen with gastritis. 3. Moderate diverticulosis of the colon, most prominent in the sigmoid colon, without active inflammation. 4. Aortic atherosclerosis. Electronically signed by: Toribio Agreste MD 07/20/2024 01:56 PM EST RP Workstation: [id number]   DG Chest Port 1 View Result Date: 07/20/2024 EXAM: 1 VIEW(S) XRAY OF THE CHEST 07/20/2024 11:20:00 AM COMPARISON: 07/17/2024 CLINICAL HISTORY: 89 year old female. Questionable sepsis - evaluate for abnormality. FINDINGS: LUNGS AND PLEURA: No focal pulmonary opacity. No pleural effusion. No pneumothorax. HEART AND MEDIASTINUM: Aortic atherosclerosis. No acute abnormality of the cardiac and mediastinal silhouettes. BONES AND SOFT TISSUES: Advanced degenerative changes in left shoulder. No acute osseous abnormality. IMPRESSION: 1. No acute cardiopulmonary abnormality. Electronically signed by: Helayne Hurst MD 07/20/2024 11:58 AM EST RP Workstation: [id number]   ECHOCARDIOGRAM COMPLETE Result Date: 07/19/2024    ECHOCARDIOGRAM REPORT   Patient Name:   DIANCA OWENSBY Date of Exam: 07/18/2024 Medical Rec #:  [id number]         Height:       62.0  in Accession #:    [id number]        Weight:       110.8 lb Date of Birth:  April 29, 1933        BSA:          1.487 m Patient Age:    89 years          BP:           136/65 mmHg Patient Gender: F                 HR:           76 bpm. Exam Location:  [facility] Procedure: 2D Echo, Cardiac Doppler and Color Doppler (Both Spectral and Color            Flow Doppler were utilized during procedure). Indications:     Elevated Troponin  History:  Patient has no prior history of Echocardiogram examinations.                  Prior CABG.  Sonographer:     Ashley McNeely-Sloane Referring Phys:  [id number] SUMAYYA AMIN Diagnosing Phys: Lonni Hanson MD IMPRESSIONS  1. Left ventricular ejection fraction, by estimation, is 60 to 65%. The left ventricle has normal function. The left ventricle has no regional wall motion abnormalities. Left ventricular diastolic parameters are consistent with Grade I diastolic dysfunction (impaired relaxation).  2. Right ventricular systolic function is normal. The right ventricular size is normal.  3. Left atrial size was mildly dilated.  4. The mitral valve is degenerative. Trivial mitral valve regurgitation. No evidence of mitral stenosis.  5. The aortic valve is bicuspid. There is mild calcification of the aortic valve. There is mild thickening of the aortic valve. Aortic valve regurgitation is not visualized. Aortic valve sclerosis is present, with no evidence of aortic valve stenosis.  6. The inferior vena cava is normal in size with <50% respiratory variability, suggesting right atrial pressure of 8 mmHg. FINDINGS  Left Ventricle: Left ventricular ejection fraction, by estimation, is 60 to 65%. The left ventricle has normal function. The left ventricle has no regional wall motion abnormalities. The left ventricular internal cavity size was normal in size. There is  no left ventricular hypertrophy. Left ventricular diastolic parameters are consistent with Grade I diastolic dysfunction (impaired  relaxation). Right Ventricle: The right ventricular size is normal. No increase in right ventricular wall thickness. Right ventricular systolic function is normal. Left Atrium: Left atrial size was mildly dilated. Right Atrium: Right atrial size was normal in size. Pericardium: There is no evidence of pericardial effusion. Mitral Valve: The mitral valve is degenerative in appearance. There is mild thickening of the mitral valve leaflet(s). Mild mitral annular calcification. Trivial mitral valve regurgitation. No evidence of mitral valve stenosis. MV peak gradient, 5.7 mmHg. The mean mitral valve gradient is 3.0 mmHg. Tricuspid Valve: The tricuspid valve is normal in structure. Tricuspid valve regurgitation is not demonstrated. Aortic Valve: The aortic valve is bicuspid. There is mild calcification of the aortic valve. There is mild thickening of the aortic valve. Aortic valve regurgitation is not visualized. Aortic valve sclerosis is present, with no evidence of aortic valve stenosis. Aortic valve mean gradient measures 4.0 mmHg. Aortic valve peak gradient measures 8.4 mmHg. Aortic valve area, by VTI measures 2.11 cm. Pulmonic Valve: The pulmonic valve was not well visualized. Pulmonic valve regurgitation is not visualized. No evidence of pulmonic stenosis. Aorta: The aortic root is normal in size and structure. Pulmonary Artery: The pulmonary artery is not well seen. Venous: The inferior vena cava is normal in size with less than 50% respiratory variability, suggesting right atrial pressure of 8 mmHg. IAS/Shunts: The interatrial septum was not well visualized.  LEFT VENTRICLE PLAX 2D LVIDd:         3.20 cm     Diastology LVIDs:         1.70 cm     LV e' medial:    6.31 cm/s LV PW:         0.90 cm     LV E/e' medial:  12.7 LV IVS:        0.80 cm     LV e' lateral:   5.87 cm/s LVOT diam:     1.70 cm     LV E/e' lateral: 13.7 LV SV:         64 LV  SV Index:   43 LVOT Area:     2.27 cm LV IVRT:       67 msec  LV  Volumes (MOD) LV vol d, MOD A2C: 52.7 ml LV vol d, MOD A4C: 38.1 ml LV vol s, MOD A2C: 22.5 ml LV vol s, MOD A4C: 20.5 ml LV SV MOD A2C:     30.2 ml LV SV MOD A4C:     38.1 ml LV SV MOD BP:      24.0 ml RIGHT VENTRICLE             IVC RV Basal diam:  3.40 cm     IVC diam: 1.80 cm RV Mid diam:    2.60 cm RV S prime:     11.30 cm/s TAPSE (M-mode): 2.0 cm LEFT ATRIUM             Index        RIGHT ATRIUM          Index LA diam:        2.90 cm 1.95 cm/m   RA Area:     9.63 cm LA Vol (A2C):   32.3 ml 21.72 ml/m  RA Volume:   18.20 ml 12.24 ml/m LA Vol (A4C):   53.6 ml 36.04 ml/m LA Biplane Vol: 41.7 ml 28.04 ml/m  AORTIC VALVE                     PULMONIC VALVE AV Area (Vmax):    2.35 cm      PV Vmax:        0.96 m/s AV Area (Vmean):   2.52 cm      PV Vmean:       67.000 cm/s AV Area (VTI):     2.11 cm      PV VTI:         0.176 m AV Vmax:           145.00 cm/s   PV Peak grad:   3.6 mmHg AV Vmean:          98.200 cm/s   PV Mean grad:   2.0 mmHg AV VTI:            0.303 m       RVOT Peak grad: 2 mmHg AV Peak Grad:      8.4 mmHg AV Mean Grad:      4.0 mmHg LVOT Vmax:         150.00 cm/s LVOT Vmean:        109.000 cm/s LVOT VTI:          0.282 m LVOT/AV VTI ratio: 0.93  AORTA Ao Root diam: 2.90 cm MITRAL VALVE MV Area (PHT): 3.77 cm     SHUNTS MV Area VTI:   3.05 cm     Systemic VTI:  0.28 m MV Peak grad:  5.7 mmHg     Systemic Diam: 1.70 cm MV Mean grad:  3.0 mmHg     Pulmonic VTI:  0.142 m MV Vmax:       1.19 m/s MV Vmean:      77.8 cm/s MV Decel Time: 201 msec MV E velocity: 80.20 cm/s MV A velocity: 110.00 cm/s MV E/A ratio:  0.73 Lonni End MD Electronically signed by Lonni Hanson MD Signature Date/Time: 07/19/2024/10:43:13 AM    Final    CT ABDOMEN PELVIS WO CONTRAST Result Date: 07/18/2024 EXAM: CT ABDOMEN AND PELVIS WITHOUT CONTRAST 07/18/2024 01:11:43 PM TECHNIQUE: CT of  the abdomen and pelvis was performed without the administration of intravenous contrast. Multiplanar reformatted images are  provided for review. Automated exposure control, iterative reconstruction, and/or weight-based adjustment of the mA/kV was utilized to reduce the radiation dose to as low as reasonably achievable. COMPARISON: CT 01/09/2018 and 05/24/2015. CLINICAL HISTORY: Abdominal pain, acute, nonlocalized. FINDINGS: LOWER CHEST: Median sternotomy wires are present. Visualized lung bases are clear. Borderline cardiomegaly. LIVER: The liver is unremarkable. GALLBLADDER AND BILE DUCTS: Previous cholecystectomy. No biliary ductal dilatation. SPLEEN: No acute abnormality. PANCREAS: No acute abnormality. ADRENAL GLANDS: No acute abnormality. KIDNEYS, URETERS AND BLADDER: Distal ureters and portions of the bladder are obscured by moderate streak artifact from bilateral hip prostheses. No stones in the kidneys or ureters. No hydronephrosis. No perinephric or periureteral stranding. GI AND BOWEL: Stomach demonstrates no acute abnormality. Moderate diverticulosis of the colon most prominent over the sigmoid colon without evidence of active inflammation or adjacent free fluid. There is no bowel obstruction. PERITONEUM AND RETROPERITONEUM: No ascites. No free air. VASCULATURE: Mild to moderate calcified plaque of the abdominal aorta which is normal in caliber. LYMPH NODES: No lymphadenopathy. REPRODUCTIVE ORGANS: No acute abnormality. BONES AND SOFT TISSUES: Moderate degenerative changes of the spine with multilevel disc disease throughout the lumbar spine. Mild to moderate compression fracture of T11, age indeterminate but new since 2019. Posterior fusion hardware intact at the L4-L5 level. Remaining bony structures unchanged. No focal soft tissue abnormality. IMPRESSION: 1. No acute findings in the abdomen or pelvis. 2. Moderate diverticulosis of the colon without evidence of active inflammation or adjacent free fluid. 3. Age-indeterminate, but new since 2019 mild to moderate T11 compression fracture. Electronically signed by: Toribio Agreste  MD 07/18/2024 05:12 PM EST RP Workstation: [id number]   MR BRAIN WO CONTRAST Result Date: 07/17/2024 CLINICAL DATA:  Initial evaluation for acute slurred speech. EXAM: MRI HEAD WITHOUT CONTRAST TECHNIQUE: Multiplanar, multiecho pulse sequences of the brain and surrounding structures were obtained without intravenous contrast. COMPARISON:  CT from earlier the same day. FINDINGS: Brain: Examination somewhat degraded by motion artifact. Diffuse prominence of the CSF containing spaces compatible generalized cerebral atrophy. Patchy and confluent T2/FLAIR hyperintensity involving the periventricular deep white matter both cerebral hemispheres as well as the pons, consistent with chronic small vessel ischemic disease, moderate in nature. No abnormal foci of restricted diffusion to suggest acute or subacute ischemia. Gray-white matter differentiation maintained. No areas of chronic cortical infarction. No acute intracranial hemorrhage. Multiple scattered chronic micro hemorrhages noted, most pronounced about the deep gray nuclei, consistent with chronic poorly controlled hypertension. No mass lesion, midline shift or mass effect. Ventricular prominence related global parenchymal volume loss without hydrocephalus. No extra-axial fluid collection. Pituitary gland within normal limits. Vascular: Major intracranial vascular flow voids are maintained. Skull and upper cervical spine: Degenerative osteoarthritic changes noted about the skull base. Bone marrow signal intensity within normal limits. No scalp soft tissue abnormality. Sinuses/Orbits: Prior bilateral ocular lens replacement. Paranasal sinuses are largely clear. Moderate left with small right mastoid effusions, likely benign/sterile. Visualized nasopharynx unremarkable. Other: None. IMPRESSION: 1. No acute intracranial abnormality. 2. Age-related cerebral atrophy with moderate chronic microvascular ischemic disease. 3. Multiple scattered chronic micro hemorrhages,  most pronounced about the deep gray nuclei, hypertensive in nature. Electronically Signed   By: Morene Hoard M.D.   On: 07/17/2024 19:37   DG Chest Portable 1 View Result Date: 07/17/2024 CLINICAL DATA:  Found on floor this morning. Patient does not remember the fall. Complaining of right arm pain. EXAM: PORTABLE CHEST  1 VIEW COMPARISON:  09/17/2021 and older exams. FINDINGS: Cardiac silhouette is normal in size. No mediastinal or hilar masses. Lungs are clear.  No pleural effusion or pneumothorax. Skeletal structures are demineralized, grossly intact. IMPRESSION: No active disease. Electronically Signed   By: Alm Parkins M.D.   On: 07/17/2024 18:51   DG Shoulder Right Result Date: 07/17/2024 CLINICAL DATA:  Fall.  Right shoulder pain. EXAM: RIGHT SHOULDER - 2+ VIEW COMPARISON:  None Available. FINDINGS: No fracture. Glenohumeral and AC joints are normally aligned. There is ossification/calcification at the [facility] joint consistent with capsular hypertrophy and synovial calcification. Skeletal structures are demineralized. Soft tissues unremarkable. IMPRESSION: 1. No fracture or dislocation. Electronically Signed   By: Alm Parkins M.D.   On: 07/17/2024 16:22   CT Head Wo Contrast Result Date: 07/17/2024 CLINICAL DATA:  Clemens with trauma to the head and neck EXAM: CT HEAD WITHOUT CONTRAST CT CERVICAL SPINE WITHOUT CONTRAST TECHNIQUE: Multidetector CT imaging of the head and cervical spine was performed following the standard protocol without intravenous contrast. Multiplanar CT image reconstructions of the cervical spine were also generated. RADIATION DOSE REDUCTION: This exam was performed according to the departmental dose-optimization program which includes automated exposure control, adjustment of the mA and/or kV according to patient size and/or use of iterative reconstruction technique. COMPARISON:  None Available. FINDINGS: CT HEAD FINDINGS Brain: Age related volume loss. Chronic small-vessel  ischemic changes of the white matter. No sign of acute infarction, mass lesion, hemorrhage, hydrocephalus or extra-axial collection. Vascular: There is atherosclerotic calcification of the major vessels at the base of the brain. Skull: Negative Sinuses/Orbits: Clear/normal Other: None CT CERVICAL SPINE FINDINGS Alignment: No traumatic malalignment. 2 mm degenerative anterolisthesis at C2-3, C6-7 and C7-T1 due to facet degeneration. Skull base and vertebrae: No regional fracture or focal lesion. Soft tissues and spinal canal: No sign of soft tissue injury. Disc levels: Advanced degenerative change at the C1-2 articulation which could relate to craniocervical pain. No apparent central canal compromise. C2-3: Facet degeneration with 2 mm of anterolisthesis. C3-4: Spondylosis.  No compressive stenosis. C4-5: Spondylosis. Moderate bony canal stenosis due to endplate osteophytes. Bilateral bony foraminal narrowing. C5-6: Spondylosis. Moderate bony canal stenosis due to endplate osteophytes. Bilateral foraminal stenosis. C6-7: Bilateral facet arthropathy with 2 mm of anterolisthesis. No canal stenosis. C7-T1: Bilateral facet arthropathy with 2 mm of anterolisthesis. No canal stenosis. Upper chest: Negative Other: None IMPRESSION: HEAD CT: No acute or traumatic finding. Age related volume loss and chronic small-vessel ischemic changes of the white matter. CERVICAL SPINE CT: No acute or traumatic finding. Advanced degenerative changes throughout the cervical region as outlined above. Electronically Signed   By: Oneil Officer M.D.   On: 07/17/2024 16:05   CT Cervical Spine Wo Contrast Result Date: 07/17/2024 CLINICAL DATA:  Clemens with trauma to the head and neck EXAM: CT HEAD WITHOUT CONTRAST CT CERVICAL SPINE WITHOUT CONTRAST TECHNIQUE: Multidetector CT imaging of the head and cervical spine was performed following the standard protocol without intravenous contrast. Multiplanar CT image reconstructions of the cervical  spine were also generated. RADIATION DOSE REDUCTION: This exam was performed according to the departmental dose-optimization program which includes automated exposure control, adjustment of the mA and/or kV according to patient size and/or use of iterative reconstruction technique. COMPARISON:  None Available. FINDINGS: CT HEAD FINDINGS Brain: Age related volume loss. Chronic small-vessel ischemic changes of the white matter. No sign of acute infarction, mass lesion, hemorrhage, hydrocephalus or extra-axial collection. Vascular: There is atherosclerotic calcification of the  major vessels at the base of the brain. Skull: Negative Sinuses/Orbits: Clear/normal Other: None CT CERVICAL SPINE FINDINGS Alignment: No traumatic malalignment. 2 mm degenerative anterolisthesis at C2-3, C6-7 and C7-T1 due to facet degeneration. Skull base and vertebrae: No regional fracture or focal lesion. Soft tissues and spinal canal: No sign of soft tissue injury. Disc levels: Advanced degenerative change at the C1-2 articulation which could relate to craniocervical pain. No apparent central canal compromise. C2-3: Facet degeneration with 2 mm of anterolisthesis. C3-4: Spondylosis.  No compressive stenosis. C4-5: Spondylosis. Moderate bony canal stenosis due to endplate osteophytes. Bilateral bony foraminal narrowing. C5-6: Spondylosis. Moderate bony canal stenosis due to endplate osteophytes. Bilateral foraminal stenosis. C6-7: Bilateral facet arthropathy with 2 mm of anterolisthesis. No canal stenosis. C7-T1: Bilateral facet arthropathy with 2 mm of anterolisthesis. No canal stenosis. Upper chest: Negative Other: None IMPRESSION: HEAD CT: No acute or traumatic finding. Age related volume loss and chronic small-vessel ischemic changes of the white matter. CERVICAL SPINE CT: No acute or traumatic finding. Advanced degenerative changes throughout the cervical region as outlined above. Electronically Signed   By: Oneil Officer M.D.   On:  07/17/2024 16:05    Microbiology: Results for orders placed or performed during the hospital encounter of 07/20/24  Resp panel by RT-PCR (RSV, Flu A&B, Covid) Anterior Nasal Swab     Status: None   Collection Time: 07/20/24 11:23 AM   Specimen: Anterior Nasal Swab  Result Value Ref Range Status   SARS Coronavirus 2 by RT PCR NEGATIVE NEGATIVE Final    Comment: (NOTE) SARS-CoV-2 target nucleic acids are NOT DETECTED.  The SARS-CoV-2 RNA is generally detectable in upper respiratory specimens during the acute phase of infection. The lowest concentration of SARS-CoV-2 viral copies this assay can detect is 138 copies/mL. A negative result does not preclude SARS-Cov-2 infection and should not be used as the sole basis for treatment or other patient management decisions. A negative result may occur with  improper specimen collection/handling, submission of specimen other than nasopharyngeal swab, presence of viral mutation(s) within the areas targeted by this assay, and inadequate number of viral copies(<138 copies/mL). A negative result must be combined with clinical observations, patient history, and epidemiological information. The expected result is Negative.  Fact Sheet for Patients:  bloggercourse.com  Fact Sheet for Healthcare Providers:  seriousbroker.it  This test is no t yet approved or cleared by the [place]  FDA and  has been authorized for detection and/or diagnosis of SARS-CoV-2 by FDA under an Emergency Use Authorization (EUA). This EUA will remain  in effect (meaning this test can be used) for the duration of the COVID-19 declaration under Section 564(b)(1) of the Act, 21 U.S.C.section 360bbb-3(b)(1), unless the authorization is terminated  or revoked sooner.       Influenza A by PCR NEGATIVE NEGATIVE Final   Influenza B by PCR NEGATIVE NEGATIVE Final    Comment: (NOTE) The Xpert Xpress SARS-CoV-2/FLU/RSV plus  assay is intended as an aid in the diagnosis of influenza from Nasopharyngeal swab specimens and should not be used as a sole basis for treatment. Nasal washings and aspirates are unacceptable for Xpert Xpress SARS-CoV-2/FLU/RSV testing.  Fact Sheet for Patients: bloggercourse.com  Fact Sheet for Healthcare Providers: seriousbroker.it  This test is not yet approved or cleared by the [place]  FDA and has been authorized for detection and/or diagnosis of SARS-CoV-2 by FDA under an Emergency Use Authorization (EUA). This EUA will remain in effect (meaning this test can be used) for  the duration of the COVID-19 declaration under Section 564(b)(1) of the Act, 21 U.S.C. section 360bbb-3(b)(1), unless the authorization is terminated or revoked.     Resp Syncytial Virus by PCR NEGATIVE NEGATIVE Final    Comment: (NOTE) Fact Sheet for Patients: bloggercourse.com  Fact Sheet for Healthcare Providers: seriousbroker.it  This test is not yet approved or cleared by the [place]  FDA and has been authorized for detection and/or diagnosis of SARS-CoV-2 by FDA under an Emergency Use Authorization (EUA). This EUA will remain in effect (meaning this test can be used) for the duration of the COVID-19 declaration under Section 564(b)(1) of the Act, 21 U.S.C. section 360bbb-3(b)(1), unless the authorization is terminated or revoked.  Performed at [facility], [address] [place], [place] [address]   Blood Culture (routine x 2)     Status: Abnormal (Preliminary result)   Collection Time: 07/20/24 11:48 AM   Specimen: BLOOD  Result Value Ref Range Status   Specimen Description   Final    BLOOD BLOOD LEFT ARM Performed at [facility], [address] [place], [place] [address]    Special Requests   Final    BOTTLES DRAWN AEROBIC AND ANAEROBIC Blood Culture  adequate volume Performed at [facility], [address] [place], [place] [address]    Culture  Setup Time   Final    GRAM POSITIVE COCCI ANAEROBIC BOTTLE ONLY CRITICAL RESULT CALLED TO, READ BACK BY AND VERIFIED WITH:  JASON ROBBINS AT 9382 07/21/24 JG    Culture (A)  Final    STAPHYLOCOCCUS SIMULANS THE SIGNIFICANCE OF ISOLATING THIS ORGANISM FROM A SINGLE SET OF BLOOD CULTURES WHEN MULTIPLE SETS ARE DRAWN IS UNCERTAIN. PLEASE NOTIFY THE MICROBIOLOGY DEPARTMENT WITHIN ONE WEEK IF SPECIATION AND SENSITIVITIES ARE REQUIRED. Performed at [facility], [address], [place], [place] [address]    Report Status PENDING  Incomplete  Blood Culture ID Panel (Reflexed)     Status: Abnormal   Collection Time: 07/20/24 11:48 AM  Result Value Ref Range Status   Enterococcus faecalis NOT DETECTED NOT DETECTED Final   Enterococcus Faecium NOT DETECTED NOT DETECTED Final   Listeria monocytogenes NOT DETECTED NOT DETECTED Final   Staphylococcus species DETECTED (A) NOT DETECTED Final    Comment: CRITICAL RESULT CALLED TO, READ BACK BY AND VERIFIED WITH:  JASON ROBBINS AT 0617 07/21/24 JG    Staphylococcus aureus (BCID) NOT DETECTED NOT DETECTED Final   Staphylococcus epidermidis NOT DETECTED NOT DETECTED Final   Staphylococcus lugdunensis NOT DETECTED NOT DETECTED Final   Streptococcus species NOT DETECTED NOT DETECTED Final   Streptococcus agalactiae NOT DETECTED NOT DETECTED Final   Streptococcus pneumoniae NOT DETECTED NOT DETECTED Final   Streptococcus pyogenes NOT DETECTED NOT DETECTED Final   A.calcoaceticus-baumannii NOT DETECTED NOT DETECTED Final   Bacteroides fragilis NOT DETECTED NOT DETECTED Final   Enterobacterales NOT DETECTED NOT DETECTED Final   Enterobacter cloacae complex NOT DETECTED NOT DETECTED Final   Escherichia coli NOT DETECTED NOT DETECTED Final   Klebsiella aerogenes NOT DETECTED NOT DETECTED Final   Klebsiella oxytoca NOT DETECTED NOT DETECTED  Final   Klebsiella pneumoniae NOT DETECTED NOT DETECTED Final   Proteus species NOT DETECTED NOT DETECTED Final   Salmonella species NOT DETECTED NOT DETECTED Final   Serratia marcescens NOT DETECTED NOT DETECTED Final   Haemophilus influenzae NOT DETECTED NOT DETECTED Final   Neisseria meningitidis NOT DETECTED NOT DETECTED Final   Pseudomonas aeruginosa NOT DETECTED NOT DETECTED Final  Stenotrophomonas maltophilia NOT DETECTED NOT DETECTED Final   Candida albicans NOT DETECTED NOT DETECTED Final   Candida auris NOT DETECTED NOT DETECTED Final   Candida glabrata NOT DETECTED NOT DETECTED Final   Candida krusei NOT DETECTED NOT DETECTED Final   Candida parapsilosis NOT DETECTED NOT DETECTED Final   Candida tropicalis NOT DETECTED NOT DETECTED Final   Cryptococcus neoformans/gattii NOT DETECTED NOT DETECTED Final    Comment: Performed at [facility], [address] [place], [place] [address]  MRSA Next Gen by PCR, Nasal     Status: None   Collection Time: 07/20/24 10:09 PM   Specimen: Nasal Mucosa; Nasal Swab  Result Value Ref Range Status   MRSA by PCR Next Gen NOT DETECTED NOT DETECTED Final    Comment: (NOTE) The GeneXpert MRSA Assay (FDA approved for NASAL specimens only), is one component of a comprehensive MRSA colonization surveillance program. It is not intended to diagnose MRSA infection nor to guide or monitor treatment for MRSA infections. Test performance is not FDA approved in patients less than 71 years old. Performed at [facility], [address] [place], [place] [address]     Labs: CBC: Recent Labs  Lab 07/17/24 1525 07/18/24 0052 07/19/24 0407 07/20/24 1123 07/20/24 1550 07/20/24 2022 07/21/24 0334 07/21/24 1706 07/22/24 0403  WBC 22.8* 23.5* 19.0* 21.2*  --   --  19.1*  --   --   NEUTROABS  --   --  14.9* 18.6*  --   --   --   --   --   HGB 13.2 10.6* 10.0* 7.6* 6.3* 7.5* 7.1* 6.6* 8.3*  HCT 41.3 33.6* 32.0* 25.7* 19.4* 23.3*  21.3* 19.5* 25.2*  MCV 85.9 87.5 88.2 93.5  --   --  85.9  --   --   PLT 495* 337 320 337  --   --  230  --   --    Basic Metabolic Panel: Recent Labs  Lab 07/17/24 2212 07/18/24 0052 07/18/24 1031 07/19/24 0407 07/20/24 1123 07/20/24 1550 07/21/24 0334 07/22/24 0403  NA  --  137  --  137 140  --  139 139  K  --  2.8*  --  4.3 3.7  --  3.0* 3.3*  CL  --  102  --  105 105  --  107 109  CO2  --  23  --  23 13*  --  24 25  GLUCOSE  --  105*  --  96 272*  --  93 92  BUN  --  37*  --  25* 82*  --  61* 33*  CREATININE  --  1.13*  --  0.96 1.09*  --  1.02* 0.93  CALCIUM   --  9.1  --  9.1 8.8*  --  8.4* 8.7*  MG 1.9  --  1.8 1.7  --  1.8  --  2.0  PHOS 4.2  --   --  2.1*  --   --   --  2.7   Liver Function Tests: Recent Labs  Lab 07/19/24 0407 07/20/24 1123  AST  --  39  ALT  --  31  ALKPHOS  --  74  BILITOT  --  0.4  PROT  --  4.9*  ALBUMIN 3.2* 3.0*   CBG: Recent Labs  Lab 07/21/24 1853 07/21/24 1939 07/22/24 0033 07/22/24 0354 07/22/24 0738  GLUCAP 98 92 86 94 86    Discharge time spent: greater than 30  minutes.  Signed: Leita Blanch, MD [facility] 07/22/2024

## 2024-07-22 NOTE — Discharge Instructions (Signed)
 [facility] will be in touch to establish home physical therapy, occupational therapy and nursing services on Monday.

## 2024-07-22 NOTE — Plan of Care (Signed)
  Problem: Clinical Measurements: Goal: Ability to maintain clinical measurements within normal limits will improve Outcome: Progressing   Problem: Activity: Goal: Risk for activity intolerance will decrease Outcome: Progressing   Problem: Pain Managment: Goal: General experience of comfort will improve and/or be controlled Outcome: Progressing   Problem: Safety: Goal: Ability to remain free from injury will improve Outcome: Progressing

## 2024-07-23 LAB — CULTURE, BLOOD (ROUTINE X 2): Special Requests: ADEQUATE

## 2024-07-24 ENCOUNTER — Encounter: Payer: Self-pay | Admitting: Internal Medicine

## 2024-07-25 ENCOUNTER — Telehealth: Payer: Self-pay

## 2024-07-25 LAB — CULTURE, BLOOD (ROUTINE X 2): Culture: NO GROWTH

## 2024-07-25 NOTE — Telephone Encounter (Signed)
 I can talk with Alicia Mccann regarding my recommendations for her not living at home by herself. Please call daugher and see if they are lookging at assisted living facilities. If so, Family will need to review/visit assisted living facilities and have assisted living facility send information to me to complete. Let me know if any questions.

## 2024-07-25 NOTE — Transitions of Care (Post Inpatient/ED Visit) (Unsigned)
   07/25/2024  Name: Alicia Mccann MRN: [id number] DOB: June 24, 1933  Today's TOC FU Call Status: Today's TOC FU Call Status:: Unsuccessful Call (1st Attempt) Unsuccessful Call (1st Attempt) Date: 07/25/24  Attempted to reach the patient regarding the most recent Inpatient/ED visit.  Follow Up Plan: Additional outreach attempts will be made to reach the patient to complete the Transitions of Care (Post Inpatient/ED visit) call.   Signature Julian Lemmings, LPN [facility] Nurse Health Advisor Direct Dial [phone]

## 2024-07-25 NOTE — Telephone Encounter (Signed)
 Daughter [place] notified. Stated she would reach out to us  once they agree on a place

## 2024-07-26 NOTE — Transitions of Care (Post Inpatient/ED Visit) (Unsigned)
   07/26/2024  Name: Alicia Mccann MRN: [id number] DOB: 22-Nov-1932  Today's TOC FU Call Status: Today's TOC FU Call Status:: Unsuccessful Call (2nd Attempt) Unsuccessful Call (1st Attempt) Date: 07/25/24 Unsuccessful Call (2nd Attempt) Date: 07/26/24  Attempted to reach the patient regarding the most recent Inpatient/ED visit.  Follow Up Plan: Additional outreach attempts will be made to reach the patient to complete the Transitions of Care (Post Inpatient/ED visit) call.   Signature Julian Lemmings, LPN [facility] Nurse Health Advisor Direct Dial [phone]

## 2024-07-27 NOTE — Transitions of Care (Post Inpatient/ED Visit) (Signed)
 07/27/2024  Name: Alicia Mccann MRN: [id number] DOB: [birth year]  Today's TOC FU Call Status: Today's TOC FU Call Status:: Successful TOC FU Call Completed Unsuccessful Call (1st Attempt) Date: 07/25/24 Unsuccessful Call (2nd Attempt) Date: 07/26/24 [facility] FU Call Complete Date: 07/27/24  Patient's Name and Date of Birth confirmed. DOB, Name  Transition Care Management Follow-up Telephone Call Date of Discharge: 07/22/24 Discharge Facility: [facility]) Type of Discharge: Inpatient Admission Primary Inpatient Discharge Diagnosis:: GI bleed How have you been since you were released from the hospital?: Better Any questions or concerns?: No  Items Reviewed: Did you receive and understand the discharge instructions provided?: Yes Medications obtained,verified, and reconciled?: Yes (Medications Reviewed) Any new allergies since your discharge?: No Dietary orders reviewed?: Yes Do you have support at home?: Yes Name of Support/Comfort Primary Source: caregivers  Medications Reviewed Today: Medications Reviewed Today     Reviewed by Emmitt Pan, LPN (Licensed Practical Nurse) on 07/27/24 at 1016  Med List Status: <None>   Medication Order Taking? Sig Documenting Provider Last Dose Status Informant  acetaminophen  (TYLENOL ) 500 MG tablet [id number] Yes Take 1,000 mg by mouth every 8 (eight) hours as needed for mild pain or moderate pain. [provider]  Active Other, Family Member, Pharmacy Records           Med Note [facility]   Wed Jul 20, 2024  4:49 PM) prn  amLODipine  (NORVASC ) 5 MG tablet [id number] Yes Take 1 tablet (5 mg total) by mouth daily. Caleen Qualia, MD  Active Family Member, Other, Pharmacy Records  aspirin  81 MG EC tablet [id number]  Take 81 mg by mouth daily. Swallow whole.  Patient not taking: Reported on 07/27/2024   [provider]  Active Other, Family Member, Pharmacy Records  atorvastatin  (LIPITOR) 10  MG tablet [id number] Yes Take 1 tablet (10 mg total) by mouth daily. Gollan, Timothy J, MD  Active Other, Family Member, Pharmacy Records  cholecalciferol  (VITAMIN D ) 1000 UNITS tablet [id number] Yes Take 1,000 Units by mouth every other day.  [provider]  Active Other, Family Member, Pharmacy Records  cyanocobalamin  (VITAMIN B12) 1000 MCG tablet [id number] Yes Take 1,000 mcg by mouth daily. [provider]  Active Other, Family Member, Pharmacy Records  lidocaine  (LIDODERM ) 5 % [id number] Yes Place 1 patch onto the skin daily. Remove & Discard patch within 12 hours or as directed by MD Caleen Qualia, MD  Active Family Member, Other, Pharmacy Records  pantoprazole  (PROTONIX ) 40 MG tablet [id number] Yes Take 1 tablet (40 mg total) by mouth 2 (two) times daily before a meal. Tobie Calix, MD  Active   sertraline  (ZOLOFT ) 25 MG tablet [id number] Yes Take 1 tablet (25 mg total) by mouth daily. Glendia Shad, MD  Active Other, Family Member, Pharmacy Records  traMADol DANNY) 50 MG tablet [id number] Yes Take 50 mg by mouth. [provider]  Active Other, Family Member, Pharmacy Records            Home Care and Equipment/Supplies: Were Home Health Services Ordered?: Yes Name of Home Health Agency:: unknown Has Agency set up a time to come to your home?: No Any new equipment or medical supplies ordered?: NA  Functional Questionnaire: Do you need assistance with bathing/showering or dressing?: Yes Do you need assistance with meal preparation?: Yes Do you need assistance with eating?: No Do you have difficulty maintaining continence: Yes Do you need assistance with getting out of bed/getting out of a chair/moving?: No Do  you have difficulty managing or taking your medications?: Yes  Follow up appointments reviewed: PCP Follow-up appointment confirmed?: Yes Date of PCP follow-up appointment?: 07/29/24 Follow-up Provider: [facility] Follow-up appointment  confirmed?: NA Do you need transportation to your follow-up appointment?: No Do you understand care options if your condition(s) worsen?: Yes-patient verbalized understanding    SIGNATURE Julian Lemmings, LPN [facility] Nurse Health Advisor Direct Dial [phone]

## 2024-07-29 ENCOUNTER — Encounter: Payer: Self-pay | Admitting: Internal Medicine

## 2024-07-29 ENCOUNTER — Ambulatory Visit: Admitting: Internal Medicine

## 2024-07-29 VITALS — BP 154/78 | HR 79 | Temp 98.1°F | Ht 62.0 in | Wt 120.0 lb

## 2024-07-29 DIAGNOSIS — N1831 Chronic kidney disease, stage 3a: Secondary | ICD-10-CM

## 2024-07-29 DIAGNOSIS — I38 Endocarditis, valve unspecified: Secondary | ICD-10-CM

## 2024-07-29 DIAGNOSIS — M25511 Pain in right shoulder: Secondary | ICD-10-CM

## 2024-07-29 DIAGNOSIS — T796XXD Traumatic ischemia of muscle, subsequent encounter: Secondary | ICD-10-CM

## 2024-07-29 DIAGNOSIS — E785 Hyperlipidemia, unspecified: Secondary | ICD-10-CM

## 2024-07-29 DIAGNOSIS — R739 Hyperglycemia, unspecified: Secondary | ICD-10-CM

## 2024-07-29 DIAGNOSIS — I739 Peripheral vascular disease, unspecified: Secondary | ICD-10-CM

## 2024-07-29 DIAGNOSIS — R413 Other amnesia: Secondary | ICD-10-CM

## 2024-07-29 DIAGNOSIS — I10 Essential (primary) hypertension: Secondary | ICD-10-CM

## 2024-07-29 DIAGNOSIS — D72829 Elevated white blood cell count, unspecified: Secondary | ICD-10-CM

## 2024-07-29 DIAGNOSIS — E782 Mixed hyperlipidemia: Secondary | ICD-10-CM

## 2024-07-29 DIAGNOSIS — D649 Anemia, unspecified: Secondary | ICD-10-CM

## 2024-07-29 DIAGNOSIS — F339 Major depressive disorder, recurrent, unspecified: Secondary | ICD-10-CM

## 2024-07-29 MED ORDER — SERTRALINE HCL 25 MG PO TABS: 25.0000 mg | ORAL_TABLET | Freq: Every day | ORAL | 1 refills | Status: DC

## 2024-07-29 NOTE — Progress Notes (Addendum)
 Subjective:    Patient ID: Alicia Mccann, female    DOB: Aug 26, 1932, 89 y.o.   MRN: [id number]  Patient here for  Chief Complaint  Patient presents with   Hospitalization Follow-up    09/21/2023 - 07/22/2024 (2 days) [facility]  Acute blood loss anemia  07/17/2024 - 07/19/2024 (2 days) [facility]  Fall    HPI Here for a hospital follow up - last hospitalization 07/20/24 - 07/22/24. She was discharged one day prior to this hospitalization after being admitted for rhabdomyolysis. She was previously hospitalized 07/17/24 - 07/19/24 -  after being found on the floor the morning of admission. Likely fell and staying on floor all night. Elevated CK. Diagnosed with rhabdomyolysis. Given gentle hydration. CK improved. Leukocytosis - CT head and MRI brain negatve. PT recommending 24/7 supervision. Received one dose of cefepime  and vancomycin . Tropinin - slightly elevated. Felt likely to be demand ischemia. ECHO - normal EF, grade 1 DD. Hydrochlorothiazide  held. Amlodipine  increased to 5mg  q day. This most recent hospitalization, she presented with black bowel movements and sob. Noted in ED to have leukocytosis at 21k with decreased hgb 7.6 (down from 10 the day prior). CT - negative for acute bleed. GI evaluated - EGD 07/20/24 - nonbleeding gastric ulcer with adherent clot which was treated with APC and two bleeding angioplastic lesions in the duodenum - treated with APC as well. Also, non bleeding duodenal ulcer. Recommended to continue PPI. Transfused - 7.1 - 8.3 hgb. CXR negative. Urine no infection. Home health ordered. Saw ortho 07/27/24 - right shoulder pain - s/p injection. Saw Dr Maree - 07/26/24. Comes in with her daughter Ivey) and caretaker. History obtained from all of them. Reports she is eating. Caretaker preparing meals. No nausea or vomiting. Denies abdominal pain. Denies noticing any blood in the stool. Breathing stable. No increased cough or  congestion. No fever. Discussed with her regarding the recommendation for 24/7 care. She has a long care policy and has been able to get 9 hours of care at home, but daughter reports unable to pay for 24/7 care. Discussed assisted living. She expressed concern regarding not being able to stay at home. Upset about the idea of having to move. She was concerned about leaving her dog. Discussed facilities that will allow pets. Discussed concern about her safety if stays at home.    Past Medical History:  Diagnosis Date   Allergic rhinitis    Anemia    Anxiety    Cellulitis    Cholelithiasis    Essential hypertension    History of stress test    a. 12/2008 Ex MV: EF 78%, no ischemia.   Insomnia    Osteoarthritis    Tachycardia    Valvular heart disease    a. 12/2008 Echo: EF 65%, mild MR;  b. 2/6 SEM RUSB - insignificant murmur, prob Ao Sclerosis.   Past Surgical History:  Procedure Laterality Date   BACK SURGERY     BUNIONECTOMY     CATARACT EXTRACTION W/PHACO Right 05/18/2019   Procedure: CATARACT EXTRACTION PHACO AND INTRAOCULAR LENS PLACEMENT (IOC) RIGHT MALYUGIN  01:06.5  20.6%  13.72;  Surgeon: Mittie Gaskin, MD;  Location: [facility];  Service: Ophthalmology;  Laterality: Right;  please leave patient arrival 10   CATARACT EXTRACTION W/PHACO Left 06/08/2019   Procedure: CATARACT EXTRACTION PHACO AND INTRAOCULAR LENS PLACEMENT (IOC) LEFT  00:51.0  20.3%  10.37;  Surgeon: Mittie Gaskin, MD;  Location: [facility];  Service: Ophthalmology;  Laterality: Left;  ARRIVAL 10:30 PLEASE LEAVE   CHOLECYSTECTOMY  03/29/2007   ESOPHAGOGASTRODUODENOSCOPY N/A 07/20/2024   Procedure: EGD (ESOPHAGOGASTRODUODENOSCOPY);  Surgeon: Jinny Carmine, MD;  Location: [facility];  Service: Endoscopy;  Laterality: N/A;   GALLBLADDER SURGERY     HEMOSTASIS CLIP PLACEMENT  07/20/2024   Procedure: CONTROL OF HEMORRHAGE, GI TRACT, ENDOSCOPIC, BY CLIPPING OR OVERSEWING;  Surgeon: Jinny Carmine, MD;  Location: [facility];  Service: Endoscopy;;   HIP SURGERY     bilateral    HOT HEMOSTASIS  07/20/2024   Procedure: EGD, WITH ARGON PLASMA COAGULATION;  Surgeon: Jinny Carmine, MD;  Location: [facility];  Service: Endoscopy;;   REPLACEMENT TOTAL KNEE Right    THUMB ARTHROSCOPY     Family History  Problem Relation Age of Onset   Heart attack Father    Stroke Father    Heart disease Father    CAD Father    Heart attack Son 5       MI   Hypertension Son    Hypertension Mother    Heart disease Mother    Stroke Mother    Breast cancer Neg Hx    Social History   Socioeconomic History   Marital status: Widowed    Spouse name: Not on file   Number of children: Not on file   Years of education: Not on file   Highest education level: Not on file  Occupational History   Not on file  Tobacco Use   Smoking status: Never   Smokeless tobacco: Never  Vaping Use   Vaping status: Never Used  Substance and Sexual Activity   Alcohol use: Yes    Comment: 2 oz QOD   Drug use: No   Sexual activity: Not Currently  Other Topics Concern   Not on file  Social History Narrative   Not on file   Social Drivers of Health   Tobacco Use: Low Risk (07/30/2024)   Patient History    Smoking Tobacco Use: Never    Smokeless Tobacco Use: Never    Passive Exposure: Not on file  Financial Resource Strain: Low Risk  (07/27/2024)   Received from [facility]   Overall Financial Resource Strain (CARDIA)    Difficulty of Paying Living Expenses: Not hard at all  Food Insecurity: No Food Insecurity (07/27/2024)   Received from [facility]   Epic    Within the past 12 months, you worried that your food would run out before you got the money to buy more.: Never true    Within the past 12 months, the food you bought just didn't last and you didn't have money to get more.: Never true  Transportation Needs: No Transportation Needs (07/27/2024)    Received from [facility] - Transportation    In the past 12 months, has lack of transportation kept you from medical appointments or from getting medications?: No    Lack of Transportation (Non-Medical): No  Physical Activity: Insufficiently Active (10/04/2021)   Exercise Vital Sign    Days of Exercise per Week: 4 days    Minutes of Exercise per Session: 20 min  Stress: No Stress Concern Present (10/04/2021)   [employer] - Occupational Stress Questionnaire    Feeling of Stress : Not at all  Social Connections: Patient Unable To Answer (07/20/2024)   Social Connection and Isolation Panel    Frequency of Communication with Friends and Family:  Patient unable to answer    Frequency of Social Gatherings with Friends and Family: Patient unable to answer    Attends Religious Services: Patient unable to answer    Active Member of Clubs or Organizations: Patient unable to answer    Attends Club or Organization Meetings: Patient unable to answer    Marital Status: Patient unable to answer  Depression (PHQ2-9): High Risk (07/29/2024)   Depression (PHQ2-9)    PHQ-2 Score: 25  Alcohol Screen: Not on file  Housing: Low Risk  (07/27/2024)   Received from [facility]   Epic    In the last 12 months, was there a time when you were not able to pay the mortgage or rent on time?: No    In the past 12 months, how many times have you moved where you were living?: 0    At any time in the past 12 months, were you homeless or living in a shelter (including now)?: No  Utilities: Not At Risk (07/27/2024)   Received from [facility]   Epic    In the past 12 months has the electric, gas, oil, or water company threatened to shut off services in your home?: No  Health Literacy: Not on file     Review of Systems  Constitutional:  Negative for appetite change, fever and unexpected weight change.  HENT:  Negative for  congestion and sinus pressure.   Respiratory:  Negative for cough, chest tightness and shortness of breath.   Cardiovascular:  Negative for chest pain, palpitations and leg swelling.  Gastrointestinal:  Negative for abdominal pain, diarrhea, nausea and vomiting.  Genitourinary:  Negative for difficulty urinating and dysuria.  Musculoskeletal:  Negative for joint swelling and myalgias.  Skin:  Negative for color change and rash.  Neurological:  Negative for dizziness and headaches.  Psychiatric/Behavioral:         Increased agitation related to discussion regarding assisted living. Expressed desire not to die, stating I have a low of things left to do.        Objective:     BP (!) 154/78   Pulse 79   Temp 98.1 F (36.7 C)   Ht 5' 2 (1.575 m)   Wt 120 lb (54.4 kg)   SpO2 97%   BMI 21.95 kg/m  Wt Readings from Last 3 Encounters:  07/29/24 120 lb (54.4 kg)  07/22/24 111 lb 1.8 oz (50.4 kg)  07/11/24 110 lb 12.8 oz (50.3 kg)    Physical Exam Vitals reviewed.  Constitutional:      General: She is not in acute distress.    Appearance: Normal appearance.  HENT:     Head: Normocephalic and atraumatic.     Right Ear: External ear normal.     Left Ear: External ear normal.     Mouth/Throat:     Pharynx: No oropharyngeal exudate or posterior oropharyngeal erythema.  Eyes:     General: No scleral icterus.       Right eye: No discharge.        Left eye: No discharge.     Conjunctiva/sclera: Conjunctivae normal.  Neck:     Thyroid : No thyromegaly.  Cardiovascular:     Rate and Rhythm: Normal rate and regular rhythm.  Pulmonary:     Effort: No respiratory distress.     Breath sounds: Normal breath sounds. No wheezing.  Abdominal:     General: Bowel sounds are normal.     Palpations: Abdomen is  soft.     Tenderness: There is no abdominal tenderness.  Musculoskeletal:        General: No swelling or tenderness.     Cervical back: Neck supple. No tenderness.   Lymphadenopathy:     Cervical: No cervical adenopathy.  Skin:    Findings: No erythema or rash.  Neurological:     Mental Status: She is alert.  Psychiatric:        Mood and Affect: Mood normal.        Behavior: Behavior normal.         Outpatient Encounter Medications as of 07/29/2024  Medication Sig   acetaminophen  (TYLENOL ) 500 MG tablet Take 1,000 mg by mouth every 8 (eight) hours as needed for mild pain or moderate pain.   amLODipine  (NORVASC ) 5 MG tablet Take 1 tablet (5 mg total) by mouth daily.   aspirin  81 MG EC tablet Take 81 mg by mouth daily. Swallow whole.   atorvastatin  (LIPITOR) 10 MG tablet Take 1 tablet (10 mg total) by mouth daily.   cholecalciferol  (VITAMIN D ) 1000 UNITS tablet Take 1,000 Units by mouth every other day.    cyanocobalamin  (VITAMIN B12) 1000 MCG tablet Take 1,000 mcg by mouth daily.   lidocaine  (LIDODERM ) 5 % Place 1 patch onto the skin daily. Remove & Discard patch within 12 hours or as directed by MD   pantoprazole  (PROTONIX ) 40 MG tablet Take 1 tablet (40 mg total) by mouth 2 (two) times daily before a meal.   traMADol (ULTRAM) 50 MG tablet Take 50 mg by mouth.   sertraline  (ZOLOFT ) 25 MG tablet Take 1 tablet (25 mg total) by mouth daily.   [DISCONTINUED] sertraline  (ZOLOFT ) 25 MG tablet Take 1 tablet (25 mg total) by mouth daily.   No facility-administered encounter medications on file as of 07/29/2024.     Lab Results  Component Value Date   WBC 28.5 (HH) 07/29/2024   HGB 9.7 (L) 07/29/2024   HCT 29.8 (L) 07/29/2024   PLT 497 (H) 07/29/2024   GLUCOSE 80 07/29/2024   CHOL 120 07/29/2024   TRIG 117 07/29/2024   HDL 52 07/29/2024   LDLDIRECT 66.0 09/07/2018   LDLCALC 47 07/29/2024   ALT 17 07/29/2024   AST 13 07/29/2024   NA 138 07/29/2024   K 3.6 07/29/2024   CL 102 07/29/2024   CREATININE 1.21 (H) 07/29/2024   BUN 23 07/29/2024   CO2 20 07/29/2024   TSH 1.45 05/17/2024   INR 1.1 07/20/2024   HGBA1C 5.2 07/29/2024    CT  ANGIO GI BLEED Result Date: 07/20/2024 EXAM: CTA ABDOMEN AND PELVIS WITH CONTRAST 07/20/2024 12:55:36 PM TECHNIQUE: CTA images of the abdomen and pelvis with 80 mL iohexol  (OMNIPAQUE ) 350 MG/ML injection 100 mL IOHEXOL  350 MG/ML SOLN intravenous contrast. Three-dimensional MIP/volume rendered formations were performed. Automated exposure control, iterative reconstruction, and/or weight based adjustment of the mA/kV was utilized to reduce the radiation dose to as low as reasonably achievable. COMPARISON: CT abdomen/pelvis 07/18/2024, CT 03/18/2019. CLINICAL HISTORY: GI bleed, significantly elevated lactic, eval ischemia. FINDINGS: VASCULATURE: GI BLEED: No significant enhancement to suggest acute/active GI bleed. AORTA: There is moderate calcified plaque of the abdominal aorta which is normal in caliber. No abdominal aortic aneurysm. No dissection. CELIAC TRUNK: Calcified plaque at the origin of the celiac artery. No occlusion or significant stenosis. SUPERIOR MESENTERIC ARTERY: Calcified plaque at the origin of the superior mesenteric artery. No occlusion or significant stenosis. INFERIOR MESENTERIC ARTERY: No acute finding. No occlusion or  significant stenosis. RENAL ARTERIES: Calcified plaque at the origin of the renal arteries which are otherwise patent. No occlusion or significant stenosis. ILIAC ARTERIES: No acute finding. No occlusion or significant stenosis. ABDOMEN/PELVIS: LOWER CHEST: Mild stable cardiomegaly. Calcified plaque over the visualized descending thoracic aorta. Visualized lung bases demonstrate no acute airspace process or effusion. There is a 7 mm nodule over the left lower lobe (image 3) unchanged from 2020 and therefore considered benign. LIVER: The liver is unremarkable. GALLBLADDER AND BILE DUCTS: Previous cholecystectomy. No biliary ductal dilatation. SPLEEN: The spleen is unremarkable. PANCREAS: The pancreas is unremarkable. ADRENAL GLANDS: Bilateral adrenal glands demonstrate no acute  abnormality. KIDNEYS, URETERS AND BLADDER: No stones in the kidneys or ureters. No hydronephrosis. No perinephric or periureteral stranding. Urinary bladder is unremarkable. GI AND BOWEL: There is wall thickening of the gastric antrum without adjacent inflammatory change or fluid. These findings could be seen with gastritis. Moderate diverticulosis of the colon most prominent of the sigmoid colon without definite active inflammation. There is no bowel obstruction. No abnormal bowel wall thickening or distension. REPRODUCTIVE: Stable simple appearing 2.5 cm right ovarian cyst. PERITONEUM AND RETROPERITONEUM: No ascites or free air. LYMPH NODES: No lymphadenopathy. BONES AND SOFT TISSUES: T11 compression fracture unchanged. Mild loss of anterior vertebral body height of T12 unchanged. Posterior fusion hardware intact over the L4-L5 level with interbody fusion. Bilateral hip prostheses are otherwise unchanged. Note that there is moderate streak artifact over the pelvis due to bilateral hip prostheses. No acute soft tissue abnormality. IMPRESSION: 1. No active GI bleeding. 2. Wall thickening of the gastric antrum without adjacent inflammatory change or fluid as findings may be seen with gastritis. 3. Moderate diverticulosis of the colon, most prominent in the sigmoid colon, without active inflammation. 4. Aortic atherosclerosis. Electronically signed by: Toribio Agreste MD 07/20/2024 01:56 PM EST RP Workstation: [id number]   DG Chest Port 1 View Result Date: 07/20/2024 EXAM: 1 VIEW(S) XRAY OF THE CHEST 07/20/2024 11:20:00 AM COMPARISON: 07/17/2024 CLINICAL HISTORY: 89 year old female. Questionable sepsis - evaluate for abnormality. FINDINGS: LUNGS AND PLEURA: No focal pulmonary opacity. No pleural effusion. No pneumothorax. HEART AND MEDIASTINUM: Aortic atherosclerosis. No acute abnormality of the cardiac and mediastinal silhouettes. BONES AND SOFT TISSUES: Advanced degenerative changes in left shoulder. No acute  osseous abnormality. IMPRESSION: 1. No acute cardiopulmonary abnormality. Electronically signed by: Helayne Hurst MD 07/20/2024 11:58 AM EST RP Workstation: [id number]       Assessment & Plan:  Anemia, unspecified type Assessment & Plan: Recent admision with GI bleed. GI evaluated - EGD 07/20/24 - nonbleeding gastric ulcer with adherent clot which was treated with APC and two bleeding angioplastic lesions in the duodenum - treated with APC as well. Also, non bleeding duodenal ulcer. Recommended to continue PPI. Transfused - 7.1 - 8.3 hgb.  Recheck cbc today.   Orders: -     CBC with Differential/Platelet  Stage 3a chronic kidney disease (HCC) Assessment & Plan: Continue to avoid antiinflammatories.  Stay hydrated. Check metabolic panel today.   Orders: -     Basic metabolic panel with GFR -     Basic metabolic panel with GFR  Hyperglycemia Assessment & Plan: Check met b and A1c.   Orders: -     Hemoglobin A1c  Mixed hyperlipidemia Assessment & Plan: Continue lipitor. Follow lipid panel.   Orders: -     Hepatic function panel -     Lipid panel  Valvular heart disease Assessment & Plan: Had echo - recent hospitalization. Normal  EF. Follow.    Right shoulder pain, unspecified chronicity Assessment & Plan: Recently found on the floor. Presumed that she fell. Right shoulder pain. Xray negative for fracture. . Saw ortho 07/27/24 - right shoulder pain - s/p injection.    Traumatic rhabdomyolysis, subsequent encounter Assessment & Plan: Recent hospitalization after being found on the floor. CK improved after gentle hydration. Check metabolic panel.    PAD (peripheral artery disease) Assessment & Plan: Continue lipitor and aspirin .    Memory changes Assessment & Plan: Has seen neurology. Fu - mild late onset dementia. Just had f/u as outlined.    Leukocytosis, unspecified type Assessment & Plan: Leukocytosis - CT head and MRI brain negatve. PT recommendng 24/7  supervision. Received one dose of cefepime  and vancomycin . Discharged on no abx. Returned the following day - black stools. White count remained elevated - 21K. No abx recommended. Recheck cbc today.    Primary hypertension Assessment & Plan: Recent hospitalization - recommended to hold hydrochlorothiazide . Amlodipine  increased to 5mg  q day. Blood pressure as outlined. No change in medication today. Follow pressures.    Hyperlipidemia, unspecified hyperlipidemia type Assessment & Plan: Continue lipitor.    Depression, recurrent Assessment & Plan: Continue zoloft . Discussed today. She expressed she wanted to live because she had a lot of things she wanted to do.    Other orders -     Sertraline  HCl; Take 1 tablet (25 mg total) by mouth daily.  Dispense: 90 tablet; Refill: 1   Discussed assisted living. Daughter pland to visit [place] this week. Will keep us  updated.   Allena Hamilton, MD

## 2024-07-30 ENCOUNTER — Ambulatory Visit: Payer: Self-pay | Admitting: Internal Medicine

## 2024-07-30 ENCOUNTER — Encounter: Payer: Self-pay | Admitting: Internal Medicine

## 2024-07-30 LAB — BASIC METABOLIC PANEL WITH GFR
BUN/Creatinine Ratio: 19 (ref 12–28)
BUN: 23 mg/dL (ref 8–27)
CO2: 20 mmol/L (ref 20–29)
Calcium: 9.4 mg/dL (ref 8.7–10.3)
Chloride: 102 mmol/L (ref 96–106)
Creatinine, Ser: 1.21 mg/dL — ABNORMAL HIGH (ref 0.57–1.00)
Glucose: 80 mg/dL (ref 70–99)
Potassium: 3.6 mmol/L (ref 3.5–5.2)
Sodium: 138 mmol/L (ref 134–144)
eGFR: 43 mL/min/1.73 — ABNORMAL LOW (ref >59)

## 2024-07-30 LAB — HEPATIC FUNCTION PANEL
ALT: 17 IU/L (ref 0–32)
AST: 13 IU/L (ref 0–40)
Albumin: 3.7 g/dL (ref 3.7–4.7)
Alkaline Phosphatase: 82 IU/L (ref 48–129)
Bilirubin Total: 0.4 mg/dL (ref 0.0–1.2)
Bilirubin, Direct: 0.16 mg/dL (ref 0.00–0.40)
Total Protein: 5.4 g/dL — ABNORMAL LOW (ref 6.0–8.5)

## 2024-07-30 LAB — CBC WITH DIFFERENTIAL/PLATELET
Basophils Absolute: 0.1 x10E3/uL (ref 0.0–0.2)
Basos: 0 %
EOS (ABSOLUTE): 0.3 x10E3/uL (ref 0.0–0.4)
Eos: 1 %
Hematocrit: 29.8 % — ABNORMAL LOW (ref 34.0–46.6)
Hemoglobin: 9.7 g/dL — ABNORMAL LOW (ref 11.1–15.9)
Immature Grans (Abs): 0.2 x10E3/uL — ABNORMAL HIGH (ref 0.0–0.1)
Immature Granulocytes: 1 %
Lymphocytes Absolute: 2.5 x10E3/uL (ref 0.7–3.1)
Lymphs: 9 %
MCH: 29.3 pg (ref 26.6–33.0)
MCHC: 32.6 g/dL (ref 31.5–35.7)
MCV: 90 fL (ref 79–97)
Monocytes Absolute: 1.3 x10E3/uL — ABNORMAL HIGH (ref 0.1–0.9)
Monocytes: 5 %
Neutrophils Absolute: 24.1 x10E3/uL — ABNORMAL HIGH (ref 1.4–7.0)
Neutrophils: 84 %
Platelets: 497 x10E3/uL — ABNORMAL HIGH (ref 150–450)
RBC: 3.31 x10E6/uL — ABNORMAL LOW (ref 3.77–5.28)
RDW: 13.8 % (ref 11.7–15.4)
WBC: 28.5 x10E3/uL (ref 3.4–10.8)

## 2024-07-30 LAB — LIPID PANEL
Chol/HDL Ratio: 2.3 ratio (ref 0.0–4.4)
Cholesterol, Total: 120 mg/dL (ref 100–199)
HDL: 52 mg/dL (ref >39)
LDL Chol Calc (NIH): 47 mg/dL (ref 0–99)
Triglycerides: 117 mg/dL (ref 0–149)
VLDL Cholesterol Cal: 21 mg/dL (ref 5–40)

## 2024-07-30 LAB — HEMOGLOBIN A1C
Est. average glucose Bld gHb Est-mCnc: 103 mg/dL
Hgb A1c MFr Bld: 5.2 % (ref 4.8–5.6)

## 2024-07-30 NOTE — Assessment & Plan Note (Signed)
 Has seen neurology. Fu - mild late onset dementia. Just had f/u as outlined.

## 2024-07-30 NOTE — Assessment & Plan Note (Signed)
 Leukocytosis - CT head and MRI brain negatve. PT recommendng 24/7 supervision. Received one dose of cefepime  and vancomycin . Discharged on no abx. Returned the following day - black stools. White count remained elevated - 21K. No abx recommended. Recheck cbc today.

## 2024-07-30 NOTE — Assessment & Plan Note (Signed)
 Recent admision with GI bleed. GI evaluated - EGD 07/20/24 - nonbleeding gastric ulcer with adherent clot which was treated with APC and two bleeding angioplastic lesions in the duodenum - treated with APC as well. Also, non bleeding duodenal ulcer. Recommended to continue PPI. Transfused - 7.1 - 8.3 hgb.  Recheck cbc today.

## 2024-07-30 NOTE — Assessment & Plan Note (Signed)
 Recent hospitalization - recommended to hold hydrochlorothiazide . Amlodipine  increased to 5mg  q day. Blood pressure as outlined. No change in medication today. Follow pressures.

## 2024-07-30 NOTE — Assessment & Plan Note (Signed)
Continue lipitor and aspirin.  ?

## 2024-07-30 NOTE — Assessment & Plan Note (Signed)
Continue to avoid antiinflammatories.  Stay hydrated.  Check metabolic panel today.

## 2024-07-30 NOTE — Assessment & Plan Note (Signed)
 Recent hospitalization after being found on the floor. CK improved after gentle hydration. Check metabolic panel.

## 2024-07-30 NOTE — Assessment & Plan Note (Signed)
 Check met b and A1c.

## 2024-07-30 NOTE — Assessment & Plan Note (Signed)
 Continue lipitor  ?

## 2024-07-30 NOTE — Assessment & Plan Note (Signed)
 Continue lipitor. Follow lipid panel.

## 2024-07-30 NOTE — Assessment & Plan Note (Signed)
 Continue zoloft . Discussed today. She expressed she wanted to live because she had a lot of things she wanted to do.

## 2024-07-30 NOTE — Assessment & Plan Note (Signed)
 Had echo - recent hospitalization. Normal EF. Follow.

## 2024-07-30 NOTE — Assessment & Plan Note (Signed)
 Recently found on the floor. Presumed that she fell. Right shoulder pain. Xray negative for fracture. . Saw ortho 07/27/24 - right shoulder pain - s/p injection.

## 2024-08-01 ENCOUNTER — Other Ambulatory Visit: Payer: Self-pay

## 2024-08-01 NOTE — Telephone Encounter (Unsigned)
 Copied from CRM #[id number]. Topic: Clinical - Lab/Test Results >> Aug 01, 2024  1:18 PM Hadassah PARAS wrote: Reason for CRM: [place] is returning a call to go over labs from Glendia Shad, MD. Please advise on [phone]

## 2024-08-02 ENCOUNTER — Other Ambulatory Visit: Payer: Self-pay | Admitting: Internal Medicine

## 2024-08-02 ENCOUNTER — Telehealth: Payer: Self-pay

## 2024-08-02 DIAGNOSIS — D72829 Elevated white blood cell count, unspecified: Secondary | ICD-10-CM

## 2024-08-02 NOTE — Telephone Encounter (Signed)
 Copied from CRM #[id number]. Topic: Clinical - Home Health Verbal Orders >> Aug 02, 2024  4:39 PM Delon DASEN wrote: Caller/Agency: Mardeen with [facility] Callback Number: [phone] Service Requested: Occupational Therapy Frequency: n/a Any new concerns about the patient? No- Patient and her daughter [place] declined OT eval

## 2024-08-02 NOTE — Progress Notes (Signed)
 Order placed for hematology referral.

## 2024-08-03 ENCOUNTER — Telehealth: Payer: Self-pay

## 2024-08-03 NOTE — Telephone Encounter (Signed)
 Copied from CRM [id number]. Topic: General - Other >> Aug 03, 2024 12:38 PM Deleta S wrote: Reason for CRM: patient is making complaint against physical therapist. States she cannot move and her body hurts. Hard for her to sleep would like Dr. Glendia to know this information

## 2024-08-03 NOTE — Telephone Encounter (Signed)
 Agree with tylenol  and topical pain medication. Would recommend holding on tramadol. Let us  know if persistent problems. If any acute issues, will need to be reevaluated.

## 2024-08-03 NOTE — Telephone Encounter (Signed)
 Pt notified. Care giver also notified

## 2024-08-09 ENCOUNTER — Inpatient Hospital Stay

## 2024-08-09 ENCOUNTER — Inpatient Hospital Stay: Attending: Oncology | Admitting: Oncology

## 2024-08-09 ENCOUNTER — Encounter: Payer: Self-pay | Admitting: Oncology

## 2024-08-09 ENCOUNTER — Ambulatory Visit
Admission: RE | Admit: 2024-08-09 | Discharge: 2024-08-09 | Disposition: A | Discharge status: Home / Self Care | Source: Ambulatory Visit | Attending: Oncology | Admitting: Oncology

## 2024-08-09 VITALS — BP 134/63 | HR 98 | Temp 97.7°F | Resp 19 | Ht 62.0 in | Wt 122.6 lb

## 2024-08-09 DIAGNOSIS — D72828 Other elevated white blood cell count: Secondary | ICD-10-CM | POA: Insufficient documentation

## 2024-08-09 DIAGNOSIS — D72829 Elevated white blood cell count, unspecified: Secondary | ICD-10-CM | POA: Insufficient documentation

## 2024-08-09 DIAGNOSIS — D649 Anemia, unspecified: Secondary | ICD-10-CM

## 2024-08-09 DIAGNOSIS — D508 Other iron deficiency anemias: Secondary | ICD-10-CM | POA: Diagnosis not present

## 2024-08-09 LAB — CBC WITH DIFFERENTIAL/PLATELET
Abs Immature Granulocytes: 0.11 K/uL — ABNORMAL HIGH (ref 0.00–0.07)
Basophils Absolute: 0.1 K/uL (ref 0.0–0.1)
Basophils Relative: 1 %
Eosinophils Absolute: 1 K/uL — ABNORMAL HIGH (ref 0.0–0.5)
Eosinophils Relative: 6 %
HCT: 28.9 % — ABNORMAL LOW (ref 36.0–46.0)
Hemoglobin: 9.2 g/dL — ABNORMAL LOW (ref 12.0–15.0)
Immature Granulocytes: 1 %
Lymphocytes Relative: 9 %
Lymphs Abs: 1.6 K/uL (ref 0.7–4.0)
MCH: 28.5 pg (ref 26.0–34.0)
MCHC: 31.8 g/dL (ref 30.0–36.0)
MCV: 89.5 fL (ref 80.0–100.0)
Monocytes Absolute: 0.9 K/uL (ref 0.1–1.0)
Monocytes Relative: 5 %
Neutro Abs: 14.2 K/uL — ABNORMAL HIGH (ref 1.7–7.7)
Neutrophils Relative %: 78 %
Platelets: 488 K/uL — ABNORMAL HIGH (ref 150–400)
RBC: 3.23 MIL/uL — ABNORMAL LOW (ref 3.87–5.11)
RDW: 14.7 % (ref 11.5–15.5)
WBC: 18 K/uL — ABNORMAL HIGH (ref 4.0–10.5)
nRBC: 0 % (ref 0.0–0.2)

## 2024-08-09 LAB — COMPREHENSIVE METABOLIC PANEL WITH GFR
ALT: 9 U/L (ref 0–44)
AST: 16 U/L (ref 15–41)
Albumin: 3.6 g/dL (ref 3.5–5.0)
Alkaline Phosphatase: 91 U/L (ref 38–126)
Anion gap: 12 (ref 5–15)
BUN: 16 mg/dL (ref 8–23)
CO2: 23 mmol/L (ref 22–32)
Calcium: 9.2 mg/dL (ref 8.9–10.3)
Chloride: 104 mmol/L (ref 98–111)
Creatinine, Ser: 1.13 mg/dL — ABNORMAL HIGH (ref 0.44–1.00)
GFR, Estimated: 46 mL/min — ABNORMAL LOW
Glucose, Bld: 83 mg/dL (ref 70–99)
Potassium: 3.5 mmol/L (ref 3.5–5.1)
Sodium: 140 mmol/L (ref 135–145)
Total Bilirubin: 0.3 mg/dL (ref 0.0–1.2)
Total Protein: 5.7 g/dL — ABNORMAL LOW (ref 6.5–8.1)

## 2024-08-09 LAB — C-REACTIVE PROTEIN: CRP: 1.6 mg/dL — ABNORMAL HIGH

## 2024-08-09 LAB — TECHNOLOGIST SMEAR REVIEW
Plt Morphology: NORMAL
RBC MORPHOLOGY: NORMAL
WBC MORPHOLOGY: NORMAL

## 2024-08-09 LAB — IRON AND TIBC
Iron: 17 ug/dL — ABNORMAL LOW (ref 28–170)
Saturation Ratios: 6 % — ABNORMAL LOW (ref 10.4–31.8)
TIBC: 262 ug/dL (ref 250–450)
UIBC: 245 ug/dL

## 2024-08-09 LAB — RETICULOCYTES
Immature Retic Fract: 8.7 % (ref 2.3–15.9)
RBC.: 3.24 MIL/uL — ABNORMAL LOW (ref 3.87–5.11)
Retic Count, Absolute: 47.6 K/uL (ref 19.0–186.0)
Retic Ct Pct: 1.5 % (ref 0.4–3.1)

## 2024-08-09 LAB — FOLATE: Folate: 8.6 ng/mL

## 2024-08-09 LAB — FERRITIN: Ferritin: 72 ng/mL (ref 11–307)

## 2024-08-09 LAB — VITAMIN B12: Vitamin B-12: 1473 pg/mL — ABNORMAL HIGH (ref 180–914)

## 2024-08-09 LAB — SEDIMENTATION RATE: Sed Rate: 34 mm/h — ABNORMAL HIGH (ref 0–30)

## 2024-08-09 LAB — LACTATE DEHYDROGENASE: LDH: 219 U/L (ref 105–235)

## 2024-08-09 LAB — TSH: TSH: 3.22 u[IU]/mL (ref 0.350–4.500)

## 2024-08-09 NOTE — Progress Notes (Signed)
 "  Hematology/Oncology Consult note [facility] [phone] [phone]  Patient Care Team: Glendia Shad, MD as PCP - General (Internal Medicine) Perla Evalene PARAS, MD as PCP - Cardiology (Cardiology)   Name of the [name]  [id number]  03-03-1933    Reason for referral-leukocytosis   Referring physician-Charlene Glendia, MD  Date of visit: 08/09/2024   History of presenting illness-patient is a 89 year old female with a past medical history significant for hypertension hyperlipidemia, atherosclerosis, dementia, peripheral arterial disease among other medical problems.She has been referred for leukocytosis.  Labs from 07/29/2024 showed white cell count of 28.5, H&H of 9.7/29.8 and a platelet count of 497.  Differential mainly showed neutrophilia with an absolute neutrophil count of 24 and some evidence of monocytosis.  Looking back at her prior CBCs patient has had mild intermittent leukocytosis with a white cell count which has mainly fluctuated between 11-12 up until August 2025.  Over the last 1 month her peak white count has been between 19-30.  Baseline hemoglobin typically runs around 12 and over the last 1 month her hemoglobin has been around 9-10.  Patient had an CT abdomen pelvis without contrast on 07/18/2024 which showed diverticulosis of the colon without any active inflammation.  MRI brain showed age-related cerebral atrophy and microhemorrhages chronic.  Patient also had a CT angio to rule out any bleeding which did not show any evidence of active GI bleeding.  Chest x-ray did not show any acute cardiopulmonary abnormality.  Discussed the use of AI scribe software for clinical note transcription with the patient, who gave verbal consent to proceed.  She was recently hospitalized, during which a CT scan of the abdomen and a chest x-ray showed no evidence of infection or acute pathology. She denies fevers, infectious  symptoms, respiratory complaints, or abdominal complaints.  She has longstanding osteoarthritis, which she describes as her most significant ongoing problem. She experiences intermittent joint pain in various locations, without significant worsening over the past month. She has undergone multiple joint replacements and reports no new or worsening joint symptoms.       ECOG PS- 1  Pain scale- 3   Review of systems- Review of Systems  Constitutional:  Positive for malaise/fatigue. Negative for chills, fever and weight loss.  HENT:  Negative for congestion, ear discharge and nosebleeds.   Eyes:  Negative for blurred vision.  Respiratory:  Negative for cough, hemoptysis, sputum production, shortness of breath and wheezing.   Cardiovascular:  Negative for chest pain, palpitations, orthopnea and claudication.  Gastrointestinal:  Negative for abdominal pain, blood in stool, constipation, diarrhea, heartburn, melena, nausea and vomiting.  Genitourinary:  Negative for dysuria, flank pain, frequency, hematuria and urgency.  Musculoskeletal:  Positive for joint pain. Negative for back pain and myalgias.  Skin:  Negative for rash.  Neurological:  Negative for dizziness, tingling, focal weakness, seizures, weakness and headaches.  Endo/Heme/Allergies:  Does not bruise/bleed easily.  Psychiatric/Behavioral:  Negative for depression and suicidal ideas. The patient does not have insomnia.     Allergies[1]  Patient Active Problem List   Diagnosis Date Noted   Dementia without behavioral disturbance (HCC) 07/20/2024   Acute blood loss anemia (ABLA) 07/20/2024   Blood in stool 07/20/2024   Duodenal ulcer 07/20/2024   Acute gastric ulcer 07/20/2024   Elevated troponin 07/18/2024   Rhabdomyolysis 07/17/2024   HTN (hypertension) 07/17/2024   HLD (hyperlipidemia) 07/17/2024   Fall at home, initial encounter 07/17/2024   Acute renal failure superimposed on  stage 3a chronic kidney disease (HCC)  07/17/2024   Depression with anxiety 07/17/2024   Myocardial injury 07/17/2024   Right shoulder pain 07/17/2024   Falls 04/04/2023   Skin lesion 02/01/2023   Sleep difficulties 11/04/2022   Nocturia 07/08/2022   Dysuria 07/01/2022   Bilateral shoulder pain 05/27/2022   Rash 04/06/2022   Leukocytosis 09/17/2021   Shortness of breath    Hypoxia    Hypokalemia 08/09/2021   Hyponatremia 08/09/2021   Syncope 07/20/2021   Joint pain 04/07/2021   Unsteady gait 12/18/2019   Depression, recurrent 07/31/2019   Memory changes 07/31/2019   Left knee pain 05/08/2019   Aortic atherosclerosis 05/10/2018   Coronary artery calcification seen on CAT scan 05/10/2018   Decreased hearing 05/02/2018   History of total right knee replacement 01/21/2018   Chronic constipation    Noninfectious gastroenteritis    Lung nodule 01/17/2018   Anemia 01/14/2018   Hyperglycemia 01/09/2018   Colitis 01/09/2018   Right knee pain 11/28/2017   Light headedness 10/21/2017   Healthcare maintenance 06/25/2017   CKD (chronic kidney disease) stage 3, GFR 30-59 ml/min (HCC) 06/25/2017   Valvular heart disease 03/03/2017   Malaise and fatigue 01/05/2017   PAD (peripheral artery disease) 05/19/2016   Diverticulitis 05/25/2015   Abdominal pain 05/24/2015   Allergic rhinitis 04/30/2015   Absolute anemia 04/30/2015   Anxiety 04/30/2015   Carpal tunnel syndrome 04/30/2015   Chronic low back pain 04/30/2015   Edema of extremities 04/30/2015   Cannot sleep 04/30/2015   Arthritis sicca 04/30/2015   Seasonal affective disorder 04/30/2015   Heart murmur 06/27/2013   Essential hypertension 06/27/2013   Osteoarthritis 06/27/2013   Mixed hyperlipidemia 06/27/2013   Cholelithiasis without obstruction 03/22/2007     Past Medical History:  Diagnosis Date   Allergic rhinitis    Anemia    Anxiety    Cellulitis    Cholelithiasis    Essential hypertension    History of stress test    a. 12/2008 Ex MV: EF 78%, no  ischemia.   Insomnia    Osteoarthritis    Tachycardia    Valvular heart disease    a. 12/2008 Echo: EF 65%, mild MR;  b. 2/6 SEM RUSB - insignificant murmur, prob Ao Sclerosis.     Past Surgical History:  Procedure Laterality Date   BACK SURGERY     BUNIONECTOMY     CATARACT EXTRACTION W/PHACO Right 05/18/2019   Procedure: CATARACT EXTRACTION PHACO AND INTRAOCULAR LENS PLACEMENT (IOC) RIGHT MALYUGIN  01:06.5  20.6%  13.72;  Surgeon: Mittie Gaskin, MD;  Location: [facility];  Service: Ophthalmology;  Laterality: Right;  please leave patient arrival 10   CATARACT EXTRACTION W/PHACO Left 06/08/2019   Procedure: CATARACT EXTRACTION PHACO AND INTRAOCULAR LENS PLACEMENT (IOC) LEFT  00:51.0  20.3%  10.37;  Surgeon: Mittie Gaskin, MD;  Location: [facility];  Service: Ophthalmology;  Laterality: Left;  ARRIVAL 10:30 PLEASE LEAVE   CHOLECYSTECTOMY  03/29/2007   ESOPHAGOGASTRODUODENOSCOPY N/A 07/20/2024   Procedure: EGD (ESOPHAGOGASTRODUODENOSCOPY);  Surgeon: Jinny Carmine, MD;  Location: [facility];  Service: Endoscopy;  Laterality: N/A;   GALLBLADDER SURGERY     HEMOSTASIS CLIP PLACEMENT  07/20/2024   Procedure: CONTROL OF HEMORRHAGE, GI TRACT, ENDOSCOPIC, BY CLIPPING OR OVERSEWING;  Surgeon: Jinny Carmine, MD;  Location: [facility] ENDOSCOPY;  Service: Endoscopy;;   HIP SURGERY     bilateral    HOT HEMOSTASIS  07/20/2024   Procedure: EGD, WITH ARGON PLASMA COAGULATION;  Surgeon: Jinny Carmine,  MD;  Location: [facility] ENDOSCOPY;  Service: Endoscopy;;   REPLACEMENT TOTAL KNEE Right    THUMB ARTHROSCOPY      Social History   Socioeconomic History   Marital status: Widowed    Spouse name: Not on file   Number of children: 1   Years of education: Not on file   Highest education level: Not on file  Occupational History   Not on file  Tobacco Use   Smoking status: Never   Smokeless tobacco: Never  Vaping Use   Vaping status: Never Used  Substance and Sexual Activity    Alcohol use: Yes    Comment: 2 oz QOD   Drug use: No   Sexual activity: Not Currently  Other Topics Concern   Not on file  Social History Narrative   Not on file   Social Drivers of Health   Tobacco Use: Low Risk (08/09/2024)   Patient History    Smoking Tobacco Use: Never    Smokeless Tobacco Use: Never    Passive Exposure: Not on file  Financial Resource Strain: Low Risk  (07/27/2024)   Received from [facility]   Overall Financial Resource Strain (CARDIA)    Difficulty of Paying Living Expenses: Not hard at all  Food Insecurity: No Food Insecurity (07/27/2024)   Received from [facility]   Epic    Within the past 12 months, you worried that your food would run out before you got the money to buy more.: Never true    Within the past 12 months, the food you bought just didn't last and you didn't have money to get more.: Never true  Transportation Needs: No Transportation Needs (07/27/2024)   Received from [facility] - Transportation    In the past 12 months, has lack of transportation kept you from medical appointments or from getting medications?: No    Lack of Transportation (Non-Medical): No  Physical Activity: Insufficiently Active (10/04/2021)   Exercise Vital Sign    Days of Exercise per Week: 4 days    Minutes of Exercise per Session: 20 min  Stress: No Stress Concern Present (10/04/2021)   [employer] - Occupational Stress Questionnaire    Feeling of Stress : Not at all  Social Connections: Patient Unable To Answer (07/20/2024)   Social Connection and Isolation Panel    Frequency of Communication with Friends and Family: Patient unable to answer    Frequency of Social Gatherings with Friends and Family: Patient unable to answer    Attends Religious Services: Patient unable to answer    Active Member of Clubs or Organizations: Patient unable to answer    Attends Tax Inspector Meetings: Patient unable to answer    Marital Status: Patient unable to answer  Intimate Partner Violence: Patient Unable To Answer (07/20/2024)   Epic    Fear of Current or Ex-Partner: Patient unable to answer    Emotionally Abused: Patient unable to answer    Physically Abused: Patient unable to answer    Sexually Abused: Patient unable to answer  Depression (PHQ2-9): Low Risk (08/09/2024)   Depression (PHQ2-9)    PHQ-2 Score: 0  Recent Concern: Depression (PHQ2-9) - High Risk (07/29/2024)   Depression (PHQ2-9)    PHQ-2 Score: 25  Alcohol Screen: Not on file  Housing: Low Risk  (07/27/2024)   Received from [facility]   Epic    In the last 12 months,  was there a time when you were not able to pay the mortgage or rent on time?: No    In the past 12 months, how many times have you moved where you were living?: 0    At any time in the past 12 months, were you homeless or living in a shelter (including now)?: No  Utilities: Not At Risk (07/27/2024)   Received from [facility]   Epic    In the past 12 months has the electric, gas, oil, or water company threatened to shut off services in your home?: No  Health Literacy: Not on file     Family History  Problem Relation Age of Onset   Heart attack Father    Stroke Father    Heart disease Father    CAD Father    Heart attack Son 51       MI   Hypertension Son    Hypertension Mother    Heart disease Mother    Stroke Mother    Breast cancer Neg Hx     Current Medications[2]   Physical exam: There were no vitals filed for this visit. Physical Exam Cardiovascular:     Rate and Rhythm: Normal rate and regular rhythm.     Heart sounds: Normal heart sounds.  Pulmonary:     Effort: Pulmonary effort is normal.     Breath sounds: Normal breath sounds.  Abdominal:     General: Bowel sounds are normal.     Palpations: Abdomen is soft.  Skin:    General: Skin is warm and dry.   Neurological:     Mental Status: She is alert and oriented to person, place, and time.           Latest Ref Rng & Units 07/29/2024    4:35 PM  CMP  Glucose 70 - 99 mg/dL 80   BUN 8 - 27 mg/dL 23   Creatinine 9.42 - 1.00 mg/dL 8.78   Sodium 865 - 855 mmol/L 138   Potassium 3.5 - 5.2 mmol/L 3.6   Chloride 96 - 106 mmol/L 102   CO2 20 - 29 mmol/L 20   Calcium  8.7 - 10.3 mg/dL 9.4   Total Protein 6.0 - 8.5 g/dL 5.4   Total Bilirubin 0.0 - 1.2 mg/dL 0.4   Alkaline Phos 48 - 129 IU/L 82   AST 0 - 40 IU/L 13   ALT 0 - 32 IU/L 17       Latest Ref Rng & Units 07/29/2024    4:35 PM  CBC  WBC 3.4 - 10.8 x10E3/uL 28.5   Hemoglobin 11.1 - 15.9 g/dL 9.7   Hematocrit 65.9 - 46.6 % 29.8   Platelets 150 - 450 x10E3/uL 497     No images are attached to the encounter.  CT ANGIO GI BLEED Result Date: 07/20/2024 EXAM: CTA ABDOMEN AND PELVIS WITH CONTRAST 07/20/2024 12:55:36 PM TECHNIQUE: CTA images of the abdomen and pelvis with 80 mL iohexol  (OMNIPAQUE ) 350 MG/ML injection 100 mL IOHEXOL  350 MG/ML SOLN intravenous contrast. Three-dimensional MIP/volume rendered formations were performed. Automated exposure control, iterative reconstruction, and/or weight based adjustment of the mA/kV was utilized to reduce the radiation dose to as low as reasonably achievable. COMPARISON: CT abdomen/pelvis 07/18/2024, CT 03/18/2019. CLINICAL HISTORY: GI bleed, significantly elevated lactic, eval ischemia. FINDINGS: VASCULATURE: GI BLEED: No significant enhancement to suggest acute/active GI bleed. AORTA: There is moderate calcified plaque of the abdominal aorta which is normal in caliber. No abdominal aortic  aneurysm. No dissection. CELIAC TRUNK: Calcified plaque at the origin of the celiac artery. No occlusion or significant stenosis. SUPERIOR MESENTERIC ARTERY: Calcified plaque at the origin of the superior mesenteric artery. No occlusion or significant stenosis. INFERIOR MESENTERIC ARTERY: No acute finding. No  occlusion or significant stenosis. RENAL ARTERIES: Calcified plaque at the origin of the renal arteries which are otherwise patent. No occlusion or significant stenosis. ILIAC ARTERIES: No acute finding. No occlusion or significant stenosis. ABDOMEN/PELVIS: LOWER CHEST: Mild stable cardiomegaly. Calcified plaque over the visualized descending thoracic aorta. Visualized lung bases demonstrate no acute airspace process or effusion. There is a 7 mm nodule over the left lower lobe (image 3) unchanged from 2020 and therefore considered benign. LIVER: The liver is unremarkable. GALLBLADDER AND BILE DUCTS: Previous cholecystectomy. No biliary ductal dilatation. SPLEEN: The spleen is unremarkable. PANCREAS: The pancreas is unremarkable. ADRENAL GLANDS: Bilateral adrenal glands demonstrate no acute abnormality. KIDNEYS, URETERS AND BLADDER: No stones in the kidneys or ureters. No hydronephrosis. No perinephric or periureteral stranding. Urinary bladder is unremarkable. GI AND BOWEL: There is wall thickening of the gastric antrum without adjacent inflammatory change or fluid. These findings could be seen with gastritis. Moderate diverticulosis of the colon most prominent of the sigmoid colon without definite active inflammation. There is no bowel obstruction. No abnormal bowel wall thickening or distension. REPRODUCTIVE: Stable simple appearing 2.5 cm right ovarian cyst. PERITONEUM AND RETROPERITONEUM: No ascites or free air. LYMPH NODES: No lymphadenopathy. BONES AND SOFT TISSUES: T11 compression fracture unchanged. Mild loss of anterior vertebral body height of T12 unchanged. Posterior fusion hardware intact over the L4-L5 level with interbody fusion. Bilateral hip prostheses are otherwise unchanged. Note that there is moderate streak artifact over the pelvis due to bilateral hip prostheses. No acute soft tissue abnormality. IMPRESSION: 1. No active GI bleeding. 2. Wall thickening of the gastric antrum without adjacent  inflammatory change or fluid as findings may be seen with gastritis. 3. Moderate diverticulosis of the colon, most prominent in the sigmoid colon, without active inflammation. 4. Aortic atherosclerosis. Electronically signed by: Toribio Agreste MD 07/20/2024 01:56 PM EST RP Workstation: [id number]   DG Chest Port 1 View Result Date: 07/20/2024 EXAM: 1 VIEW(S) XRAY OF THE CHEST 07/20/2024 11:20:00 AM COMPARISON: 07/17/2024 CLINICAL HISTORY: 89 year old female. Questionable sepsis - evaluate for abnormality. FINDINGS: LUNGS AND PLEURA: No focal pulmonary opacity. No pleural effusion. No pneumothorax. HEART AND MEDIASTINUM: Aortic atherosclerosis. No acute abnormality of the cardiac and mediastinal silhouettes. BONES AND SOFT TISSUES: Advanced degenerative changes in left shoulder. No acute osseous abnormality. IMPRESSION: 1. No acute cardiopulmonary abnormality. Electronically signed by: Helayne Hurst MD 07/20/2024 11:58 AM EST RP Workstation: [id number]   ECHOCARDIOGRAM COMPLETE Result Date: 07/19/2024    ECHOCARDIOGRAM REPORT   Patient Name:   Alicia Mccann Date of Exam: 07/18/2024 Medical Rec #:  [id number]         Height:       62.0 in Accession #:    [id number]        Weight:       110.8 lb Date of Birth:  01-14-1933        BSA:          1.487 m Patient Age:    89 years          BP:           136/65 mmHg Patient Gender: F                 HR:  76 bpm. Exam Location:  [facility] Procedure: 2D Echo, Cardiac Doppler and Color Doppler (Both Spectral and Color            Flow Doppler were utilized during procedure). Indications:     Elevated Troponin  History:         Patient has no prior history of Echocardiogram examinations.                  Prior CABG.  Sonographer:     Ashley McNeely-Sloane Referring Phys:  [id number] SUMAYYA AMIN Diagnosing Phys: Lonni Hanson MD IMPRESSIONS  1. Left ventricular ejection fraction, by estimation, is 60 to 65%. The left ventricle has normal function. The left ventricle has no  regional wall motion abnormalities. Left ventricular diastolic parameters are consistent with Grade I diastolic dysfunction (impaired relaxation).  2. Right ventricular systolic function is normal. The right ventricular size is normal.  3. Left atrial size was mildly dilated.  4. The mitral valve is degenerative. Trivial mitral valve regurgitation. No evidence of mitral stenosis.  5. The aortic valve is bicuspid. There is mild calcification of the aortic valve. There is mild thickening of the aortic valve. Aortic valve regurgitation is not visualized. Aortic valve sclerosis is present, with no evidence of aortic valve stenosis.  6. The inferior vena cava is normal in size with <50% respiratory variability, suggesting right atrial pressure of 8 mmHg. FINDINGS  Left Ventricle: Left ventricular ejection fraction, by estimation, is 60 to 65%. The left ventricle has normal function. The left ventricle has no regional wall motion abnormalities. The left ventricular internal cavity size was normal in size. There is  no left ventricular hypertrophy. Left ventricular diastolic parameters are consistent with Grade I diastolic dysfunction (impaired relaxation). Right Ventricle: The right ventricular size is normal. No increase in right ventricular wall thickness. Right ventricular systolic function is normal. Left Atrium: Left atrial size was mildly dilated. Right Atrium: Right atrial size was normal in size. Pericardium: There is no evidence of pericardial effusion. Mitral Valve: The mitral valve is degenerative in appearance. There is mild thickening of the mitral valve leaflet(s). Mild mitral annular calcification. Trivial mitral valve regurgitation. No evidence of mitral valve stenosis. MV peak gradient, 5.7 mmHg. The mean mitral valve gradient is 3.0 mmHg. Tricuspid Valve: The tricuspid valve is normal in structure. Tricuspid valve regurgitation is not demonstrated. Aortic Valve: The aortic valve is bicuspid. There is  mild calcification of the aortic valve. There is mild thickening of the aortic valve. Aortic valve regurgitation is not visualized. Aortic valve sclerosis is present, with no evidence of aortic valve stenosis. Aortic valve mean gradient measures 4.0 mmHg. Aortic valve peak gradient measures 8.4 mmHg. Aortic valve area, by VTI measures 2.11 cm. Pulmonic Valve: The pulmonic valve was not well visualized. Pulmonic valve regurgitation is not visualized. No evidence of pulmonic stenosis. Aorta: The aortic root is normal in size and structure. Pulmonary Artery: The pulmonary artery is not well seen. Venous: The inferior vena cava is normal in size with less than 50% respiratory variability, suggesting right atrial pressure of 8 mmHg. IAS/Shunts: The interatrial septum was not well visualized.  LEFT VENTRICLE PLAX 2D LVIDd:         3.20 cm     Diastology LVIDs:         1.70 cm     LV e' medial:    6.31 cm/s LV PW:         0.90 cm     LV E/e'  medial:  12.7 LV IVS:        0.80 cm     LV e' lateral:   5.87 cm/s LVOT diam:     1.70 cm     LV E/e' lateral: 13.7 LV SV:         64 LV SV Index:   43 LVOT Area:     2.27 cm LV IVRT:       67 msec  LV Volumes (MOD) LV vol d, MOD A2C: 52.7 ml LV vol d, MOD A4C: 38.1 ml LV vol s, MOD A2C: 22.5 ml LV vol s, MOD A4C: 20.5 ml LV SV MOD A2C:     30.2 ml LV SV MOD A4C:     38.1 ml LV SV MOD BP:      24.0 ml RIGHT VENTRICLE             IVC RV Basal diam:  3.40 cm     IVC diam: 1.80 cm RV Mid diam:    2.60 cm RV S prime:     11.30 cm/s TAPSE (M-mode): 2.0 cm LEFT ATRIUM             Index        RIGHT ATRIUM          Index LA diam:        2.90 cm 1.95 cm/m   RA Area:     9.63 cm LA Vol (A2C):   32.3 ml 21.72 ml/m  RA Volume:   18.20 ml 12.24 ml/m LA Vol (A4C):   53.6 ml 36.04 ml/m LA Biplane Vol: 41.7 ml 28.04 ml/m  AORTIC VALVE                     PULMONIC VALVE AV Area (Vmax):    2.35 cm      PV Vmax:        0.96 m/s AV Area (Vmean):   2.52 cm      PV Vmean:       67.000 cm/s AV  Area (VTI):     2.11 cm      PV VTI:         0.176 m AV Vmax:           145.00 cm/s   PV Peak grad:   3.6 mmHg AV Vmean:          98.200 cm/s   PV Mean grad:   2.0 mmHg AV VTI:            0.303 m       RVOT Peak grad: 2 mmHg AV Peak Grad:      8.4 mmHg AV Mean Grad:      4.0 mmHg LVOT Vmax:         150.00 cm/s LVOT Vmean:        109.000 cm/s LVOT VTI:          0.282 m LVOT/AV VTI ratio: 0.93  AORTA Ao Root diam: 2.90 cm MITRAL VALVE MV Area (PHT): 3.77 cm     SHUNTS MV Area VTI:   3.05 cm     Systemic VTI:  0.28 m MV Peak grad:  5.7 mmHg     Systemic Diam: 1.70 cm MV Mean grad:  3.0 mmHg     Pulmonic VTI:  0.142 m MV Vmax:       1.19 m/s MV Vmean:      77.8 cm/s MV Decel Time: 201 msec MV E velocity:  80.20 cm/s MV A velocity: 110.00 cm/s MV E/A ratio:  0.73 Lonni End MD Electronically signed by Lonni Hanson MD Signature Date/Time: 07/19/2024/10:43:13 AM    Final    CT ABDOMEN PELVIS WO CONTRAST Result Date: 07/18/2024 EXAM: CT ABDOMEN AND PELVIS WITHOUT CONTRAST 07/18/2024 01:11:43 PM TECHNIQUE: CT of the abdomen and pelvis was performed without the administration of intravenous contrast. Multiplanar reformatted images are provided for review. Automated exposure control, iterative reconstruction, and/or weight-based adjustment of the mA/kV was utilized to reduce the radiation dose to as low as reasonably achievable. COMPARISON: CT 01/09/2018 and 05/24/2015. CLINICAL HISTORY: Abdominal pain, acute, nonlocalized. FINDINGS: LOWER CHEST: Median sternotomy wires are present. Visualized lung bases are clear. Borderline cardiomegaly. LIVER: The liver is unremarkable. GALLBLADDER AND BILE DUCTS: Previous cholecystectomy. No biliary ductal dilatation. SPLEEN: No acute abnormality. PANCREAS: No acute abnormality. ADRENAL GLANDS: No acute abnormality. KIDNEYS, URETERS AND BLADDER: Distal ureters and portions of the bladder are obscured by moderate streak artifact from bilateral hip prostheses. No stones in the  kidneys or ureters. No hydronephrosis. No perinephric or periureteral stranding. GI AND BOWEL: Stomach demonstrates no acute abnormality. Moderate diverticulosis of the colon most prominent over the sigmoid colon without evidence of active inflammation or adjacent free fluid. There is no bowel obstruction. PERITONEUM AND RETROPERITONEUM: No ascites. No free air. VASCULATURE: Mild to moderate calcified plaque of the abdominal aorta which is normal in caliber. LYMPH NODES: No lymphadenopathy. REPRODUCTIVE ORGANS: No acute abnormality. BONES AND SOFT TISSUES: Moderate degenerative changes of the spine with multilevel disc disease throughout the lumbar spine. Mild to moderate compression fracture of T11, age indeterminate but new since 2019. Posterior fusion hardware intact at the L4-L5 level. Remaining bony structures unchanged. No focal soft tissue abnormality. IMPRESSION: 1. No acute findings in the abdomen or pelvis. 2. Moderate diverticulosis of the colon without evidence of active inflammation or adjacent free fluid. 3. Age-indeterminate, but new since 2019 mild to moderate T11 compression fracture. Electronically signed by: Toribio Agreste MD 07/18/2024 05:12 PM EST RP Workstation: [id number]   MR BRAIN WO CONTRAST Result Date: 07/17/2024 CLINICAL DATA:  Initial evaluation for acute slurred speech. EXAM: MRI HEAD WITHOUT CONTRAST TECHNIQUE: Multiplanar, multiecho pulse sequences of the brain and surrounding structures were obtained without intravenous contrast. COMPARISON:  CT from earlier the same day. FINDINGS: Brain: Examination somewhat degraded by motion artifact. Diffuse prominence of the CSF containing spaces compatible generalized cerebral atrophy. Patchy and confluent T2/FLAIR hyperintensity involving the periventricular deep white matter both cerebral hemispheres as well as the pons, consistent with chronic small vessel ischemic disease, moderate in nature. No abnormal foci of restricted diffusion to  suggest acute or subacute ischemia. Gray-white matter differentiation maintained. No areas of chronic cortical infarction. No acute intracranial hemorrhage. Multiple scattered chronic micro hemorrhages noted, most pronounced about the deep gray nuclei, consistent with chronic poorly controlled hypertension. No mass lesion, midline shift or mass effect. Ventricular prominence related global parenchymal volume loss without hydrocephalus. No extra-axial fluid collection. Pituitary gland within normal limits. Vascular: Major intracranial vascular flow voids are maintained. Skull and upper cervical spine: Degenerative osteoarthritic changes noted about the skull base. Bone marrow signal intensity within normal limits. No scalp soft tissue abnormality. Sinuses/Orbits: Prior bilateral ocular lens replacement. Paranasal sinuses are largely clear. Moderate left with small right mastoid effusions, likely benign/sterile. Visualized nasopharynx unremarkable. Other: None. IMPRESSION: 1. No acute intracranial abnormality. 2. Age-related cerebral atrophy with moderate chronic microvascular ischemic disease. 3. Multiple scattered chronic micro hemorrhages, most pronounced about the  deep gray nuclei, hypertensive in nature. Electronically Signed   By: Morene Hoard M.D.   On: 07/17/2024 19:37   DG Chest Portable 1 View Result Date: 07/17/2024 CLINICAL DATA:  Found on floor this morning. Patient does not remember the fall. Complaining of right arm pain. EXAM: PORTABLE CHEST 1 VIEW COMPARISON:  09/17/2021 and older exams. FINDINGS: Cardiac silhouette is normal in size. No mediastinal or hilar masses. Lungs are clear.  No pleural effusion or pneumothorax. Skeletal structures are demineralized, grossly intact. IMPRESSION: No active disease. Electronically Signed   By: Alm Parkins M.D.   On: 07/17/2024 18:51   DG Shoulder Right Result Date: 07/17/2024 CLINICAL DATA:  Fall.  Right shoulder pain. EXAM: RIGHT SHOULDER - 2+  VIEW COMPARISON:  None Available. FINDINGS: No fracture. Glenohumeral and AC joints are normally aligned. There is ossification/calcification at the [facility] joint consistent with capsular hypertrophy and synovial calcification. Skeletal structures are demineralized. Soft tissues unremarkable. IMPRESSION: 1. No fracture or dislocation. Electronically Signed   By: Alm Parkins M.D.   On: 07/17/2024 16:22   CT Head Wo Contrast Result Date: 07/17/2024 CLINICAL DATA:  Clemens with trauma to the head and neck EXAM: CT HEAD WITHOUT CONTRAST CT CERVICAL SPINE WITHOUT CONTRAST TECHNIQUE: Multidetector CT imaging of the head and cervical spine was performed following the standard protocol without intravenous contrast. Multiplanar CT image reconstructions of the cervical spine were also generated. RADIATION DOSE REDUCTION: This exam was performed according to the departmental dose-optimization program which includes automated exposure control, adjustment of the mA and/or kV according to patient size and/or use of iterative reconstruction technique. COMPARISON:  None Available. FINDINGS: CT HEAD FINDINGS Brain: Age related volume loss. Chronic small-vessel ischemic changes of the white matter. No sign of acute infarction, mass lesion, hemorrhage, hydrocephalus or extra-axial collection. Vascular: There is atherosclerotic calcification of the major vessels at the base of the brain. Skull: Negative Sinuses/Orbits: Clear/normal Other: None CT CERVICAL SPINE FINDINGS Alignment: No traumatic malalignment. 2 mm degenerative anterolisthesis at C2-3, C6-7 and C7-T1 due to facet degeneration. Skull base and vertebrae: No regional fracture or focal lesion. Soft tissues and spinal canal: No sign of soft tissue injury. Disc levels: Advanced degenerative change at the C1-2 articulation which could relate to craniocervical pain. No apparent central canal compromise. C2-3: Facet degeneration with 2 mm of anterolisthesis. C3-4: Spondylosis.  No  compressive stenosis. C4-5: Spondylosis. Moderate bony canal stenosis due to endplate osteophytes. Bilateral bony foraminal narrowing. C5-6: Spondylosis. Moderate bony canal stenosis due to endplate osteophytes. Bilateral foraminal stenosis. C6-7: Bilateral facet arthropathy with 2 mm of anterolisthesis. No canal stenosis. C7-T1: Bilateral facet arthropathy with 2 mm of anterolisthesis. No canal stenosis. Upper chest: Negative Other: None IMPRESSION: HEAD CT: No acute or traumatic finding. Age related volume loss and chronic small-vessel ischemic changes of the white matter. CERVICAL SPINE CT: No acute or traumatic finding. Advanced degenerative changes throughout the cervical region as outlined above. Electronically Signed   By: Oneil Officer M.D.   On: 07/17/2024 16:05   CT Cervical Spine Wo Contrast Result Date: 07/17/2024 CLINICAL DATA:  Clemens with trauma to the head and neck EXAM: CT HEAD WITHOUT CONTRAST CT CERVICAL SPINE WITHOUT CONTRAST TECHNIQUE: Multidetector CT imaging of the head and cervical spine was performed following the standard protocol without intravenous contrast. Multiplanar CT image reconstructions of the cervical spine were also generated. RADIATION DOSE REDUCTION: This exam was performed according to the departmental dose-optimization program which includes automated exposure control, adjustment of the mA  and/or kV according to patient size and/or use of iterative reconstruction technique. COMPARISON:  None Available. FINDINGS: CT HEAD FINDINGS Brain: Age related volume loss. Chronic small-vessel ischemic changes of the white matter. No sign of acute infarction, mass lesion, hemorrhage, hydrocephalus or extra-axial collection. Vascular: There is atherosclerotic calcification of the major vessels at the base of the brain. Skull: Negative Sinuses/Orbits: Clear/normal Other: None CT CERVICAL SPINE FINDINGS Alignment: No traumatic malalignment. 2 mm degenerative anterolisthesis at C2-3, C6-7  and C7-T1 due to facet degeneration. Skull base and vertebrae: No regional fracture or focal lesion. Soft tissues and spinal canal: No sign of soft tissue injury. Disc levels: Advanced degenerative change at the C1-2 articulation which could relate to craniocervical pain. No apparent central canal compromise. C2-3: Facet degeneration with 2 mm of anterolisthesis. C3-4: Spondylosis.  No compressive stenosis. C4-5: Spondylosis. Moderate bony canal stenosis due to endplate osteophytes. Bilateral bony foraminal narrowing. C5-6: Spondylosis. Moderate bony canal stenosis due to endplate osteophytes. Bilateral foraminal stenosis. C6-7: Bilateral facet arthropathy with 2 mm of anterolisthesis. No canal stenosis. C7-T1: Bilateral facet arthropathy with 2 mm of anterolisthesis. No canal stenosis. Upper chest: Negative Other: None IMPRESSION: HEAD CT: No acute or traumatic finding. Age related volume loss and chronic small-vessel ischemic changes of the white matter. CERVICAL SPINE CT: No acute or traumatic finding. Advanced degenerative changes throughout the cervical region as outlined above. Electronically Signed   By: Oneil Officer M.D.   On: 07/17/2024 16:05    Assessment and plan- Patient is a 89 y.o. female referred for neutrophilia and normocytic anemia  Assessment and Plan    Neutrophilia Persistent leukocytosis with WBC counts 19,000-28,000. No infection or acute inflammation evident. Higher suspicion for infection or inflammation over leukemia.CT abdomen did not show any lymphadenopathy or splenomegaly in December 2025.   - Ordered additional blood work to evaluate etiology and differentiate between infection, inflammation, or hematologic malignancy including flow cytometry, bcr abl fish testing, jak2 mutation testing, ESR, CRP  Anemia: Additional anemia workup today including ferritin and iron studies B12 and folate haptoglobin TSH myeloma panel serum free light chains LDH     See me in 2  weeks   Thank you for this kind referral and the opportunity to participate in the care of this patient   Visit Diagnosis 1. Neutrophilia   2. Normocytic anemia     Dr. Annah Skene, MD, MPH [facility] [phone] 08/09/2024                   [1]  Allergies Allergen Reactions   Clindamycin /Lincomycin    Egg Protein (Egg White)    Egg Protein-Containing Drug Products    Flagyl  [Metronidazole ]    Levaquin [Levofloxacin]    Mobic  [Meloxicam ]     GI issues    Penicillins    Sulfa Antibiotics   [2]  Current Outpatient Medications:    acetaminophen  (TYLENOL ) 500 MG tablet, Take 1,000 mg by mouth every 8 (eight) hours as needed for mild pain or moderate pain., Disp: , Rfl:    amLODipine  (NORVASC ) 5 MG tablet, Take 1 tablet (5 mg total) by mouth daily., Disp: 30 tablet, Rfl: 1   aspirin  81 MG EC tablet, Take 81 mg by mouth daily. Swallow whole., Disp: , Rfl:    atorvastatin  (LIPITOR) 10 MG tablet, Take 1 tablet (10 mg total) by mouth daily., Disp: 30 tablet, Rfl: 0   cholecalciferol  (VITAMIN D ) 1000 UNITS tablet, Take 1,000 Units by mouth every  other day. , Disp: , Rfl:    cyanocobalamin  (VITAMIN B12) 1000 MCG tablet, Take 1,000 mcg by mouth daily., Disp: , Rfl:    lidocaine  (LIDODERM ) 5 %, Place 1 patch onto the skin daily. Remove & Discard patch within 12 hours or as directed by MD, Disp: 30 patch, Rfl: 0   pantoprazole  (PROTONIX ) 40 MG tablet, Take 1 tablet (40 mg total) by mouth 2 (two) times daily before a meal., Disp: 60 tablet, Rfl: 2   sertraline  (ZOLOFT ) 25 MG tablet, Take 1 tablet (25 mg total) by mouth daily., Disp: 90 tablet, Rfl: 1   traMADol (ULTRAM) 50 MG tablet, Take 50 mg by mouth., Disp: , Rfl:   "

## 2024-08-09 NOTE — Progress Notes (Signed)
 New patient; Referral from Dr. Glendia: Leukocytosis.

## 2024-08-10 LAB — KAPPA/LAMBDA LIGHT CHAINS
Kappa free light chain: 27.3 mg/L — ABNORMAL HIGH (ref 3.3–19.4)
Kappa, lambda light chain ratio: 0.84 (ref 0.26–1.65)
Lambda free light chains: 32.5 mg/L — ABNORMAL HIGH (ref 5.7–26.3)

## 2024-08-10 LAB — HAPTOGLOBIN: Haptoglobin: 213 mg/dL (ref 41–333)

## 2024-08-12 ENCOUNTER — Telehealth: Payer: Self-pay

## 2024-08-12 LAB — COMP PANEL: LEUKEMIA/LYMPHOMA

## 2024-08-12 NOTE — Telephone Encounter (Signed)
 Copied from CRM #[id number]. Topic: Clinical - Home Health Verbal Orders >> Aug 12, 2024  9:39 AM Ashley R wrote: Caller/Agency: Annabella ([facility]) Callback Number: [phone] opt 2 Service Requested: Physical Therapy, Skilled Nursing Frequency: N - 1x week for 3 weks, PT - 2x week for 2 weeks, 1x week for 6 weeks Any new concerns about the patient? No

## 2024-08-15 LAB — BCR-ABL1 FISH
Cells Analyzed: 200
Cells Counted: 200

## 2024-08-15 NOTE — Telephone Encounter (Signed)
 Ok to give orders

## 2024-08-15 NOTE — Telephone Encounter (Signed)
Verbal Order given  

## 2024-08-16 LAB — MULTIPLE MYELOMA PANEL, SERUM
Albumin SerPl Elph-Mcnc: 2.9 g/dL (ref 2.9–4.4)
Albumin/Glob SerPl: 1.3 (ref 0.7–1.7)
Alpha 1: 0.3 g/dL (ref 0.0–0.4)
Alpha2 Glob SerPl Elph-Mcnc: 0.8 g/dL (ref 0.4–1.0)
B-Globulin SerPl Elph-Mcnc: 0.8 g/dL (ref 0.7–1.3)
Gamma Glob SerPl Elph-Mcnc: 0.4 g/dL (ref 0.4–1.8)
Globulin, Total: 2.3 g/dL (ref 2.2–3.9)
IgA: 67 mg/dL (ref 64–422)
IgG (Immunoglobin G), Serum: 456 mg/dL — ABNORMAL LOW (ref 586–1602)
IgM (Immunoglobulin M), Srm: 58 mg/dL (ref 26–217)
Total Protein ELP: 5.2 g/dL — ABNORMAL LOW (ref 6.0–8.5)

## 2024-08-19 ENCOUNTER — Other Ambulatory Visit: Payer: Self-pay | Admitting: Cardiovascular Disease

## 2024-08-21 LAB — JAK2 GENOTYPR

## 2024-08-22 ENCOUNTER — Other Ambulatory Visit: Payer: Self-pay | Admitting: Cardiovascular Disease

## 2024-08-23 ENCOUNTER — Other Ambulatory Visit: Payer: Self-pay | Admitting: Cardiovascular Disease

## 2024-08-24 MED ORDER — AMLODIPINE BESYLATE 5 MG PO TABS: 5.0000 mg | ORAL_TABLET | Freq: Every day | ORAL | 0 refills | Status: DC

## 2024-08-25 NOTE — Telephone Encounter (Unsigned)
 Copied from CRM #[id number]. Topic: Clinical - Medical Advice >> Aug 25, 2024  3:32 PM Lonell PEDLAR wrote: Reason for CRM: Patient called, extremely frustrated, stating that she has been unable to get clarification as to whether her PT with [facility] was going to show up in her home today. I contact the facility, who advised that the PT was running late, and they have been unable to get in contact with them to provide pt with an ETA.   Patient states that she has been waiting at home for PT all day and has not received confirmation if provider was coming or not, which only worried her more. Please review and advise.

## 2024-08-26 ENCOUNTER — Encounter: Payer: Self-pay | Admitting: Internal Medicine

## 2024-08-26 NOTE — Telephone Encounter (Signed)
 Please schedule an appt for Friday 09/02/24 at 12:00. (Work in appt).

## 2024-08-26 NOTE — Telephone Encounter (Signed)
 Late entry  Spoke to pt. Physical therapy did come out yesterday. Also informed pt yesterday that if this happened to reach out to [facility] number was given to her for further references.

## 2024-08-26 NOTE — Telephone Encounter (Signed)
 Phone call to Gl[place], daughter on [place].  Informed her that Dr. Glendia was out of the office this afternoon and writer wanted to check in and be sure there were no urgent needs.  Glendale thank clinical research associate for call and reported that there was nothing that could not wait until next week.  She would like an appointment to discuss medication management of the agitation and paranoia.  Glendale needs help navigating this change and managing the needs for her mother.    Dr. Glendia please advise when patient can be worked in next week.

## 2024-08-29 NOTE — Telephone Encounter (Signed)
 Spoke to daughter. Appt has been made for this Friday

## 2024-08-31 ENCOUNTER — Telehealth: Payer: Self-pay

## 2024-08-31 NOTE — Telephone Encounter (Signed)
 Ok to give verbal orders. Note states any new concerns - states weakness and off balance. She has a history of falls, etc. Please call and see if there has beenan acute change. If so, needs to be evaluated before Friday's appt.

## 2024-08-31 NOTE — Telephone Encounter (Signed)
 Copied from CRM #[id number]. Topic: Clinical - Home Health Verbal Orders >> Aug 30, 2024  4:43 PM Alfonso ORN wrote: Caller/Agency: OT with [facility] Callback Number: [phone] ( secure line , vcmail ok ) Service Requested: Occupational Therapy Frequency: one week 7  Any new concerns about the patient? Yes Weakness, balance off

## 2024-08-31 NOTE — Telephone Encounter (Signed)
 Lvm for a return call. Okay to relay providers message, also need documentation of OT name for records.

## 2024-09-02 ENCOUNTER — Ambulatory Visit: Admitting: Internal Medicine

## 2024-09-02 ENCOUNTER — Encounter: Payer: Self-pay | Admitting: Internal Medicine

## 2024-09-02 VITALS — BP 126/70 | HR 93 | Temp 98.4°F | Ht 62.0 in | Wt 114.8 lb

## 2024-09-02 DIAGNOSIS — R296 Repeated falls: Secondary | ICD-10-CM

## 2024-09-02 DIAGNOSIS — D649 Anemia, unspecified: Secondary | ICD-10-CM | POA: Diagnosis not present

## 2024-09-02 DIAGNOSIS — E785 Hyperlipidemia, unspecified: Secondary | ICD-10-CM | POA: Diagnosis not present

## 2024-09-02 DIAGNOSIS — N1831 Chronic kidney disease, stage 3a: Secondary | ICD-10-CM | POA: Diagnosis not present

## 2024-09-02 DIAGNOSIS — I10 Essential (primary) hypertension: Secondary | ICD-10-CM

## 2024-09-02 DIAGNOSIS — F418 Other specified anxiety disorders: Secondary | ICD-10-CM

## 2024-09-02 DIAGNOSIS — I38 Endocarditis, valve unspecified: Secondary | ICD-10-CM

## 2024-09-02 DIAGNOSIS — F0390 Unspecified dementia without behavioral disturbance: Secondary | ICD-10-CM

## 2024-09-02 DIAGNOSIS — F419 Anxiety disorder, unspecified: Secondary | ICD-10-CM | POA: Diagnosis not present

## 2024-09-02 DIAGNOSIS — R739 Hyperglycemia, unspecified: Secondary | ICD-10-CM | POA: Diagnosis not present

## 2024-09-02 DIAGNOSIS — I739 Peripheral vascular disease, unspecified: Secondary | ICD-10-CM | POA: Diagnosis not present

## 2024-09-02 MED ORDER — ESCITALOPRAM OXALATE 5 MG PO TABS: 5.0000 mg | ORAL_TABLET | Freq: Every day | ORAL | 2 refills | Status: AC

## 2024-09-02 NOTE — Progress Notes (Signed)
 "  Subjective:    Patient ID: Alicia Mccann, female    DOB: [birth year], 89 y.o.   MRN: [id number]  Patient here for  Chief Complaint  Patient presents with   Medical Management of Chronic Issues    HPI Here for work in appt - saw GI 08/22/24 - f/u IDA. Deferred colonoscopy. With recent gastric ulcer - H. Pylori breath test ordered and was negative. PPI. Reviewed medications. It appears she had protonix  and omeprazole  - in her medication bag. Discussed only taking omeprazole  40mg  q day. Stop protonix . She is accompanied by her caretaker and her daurgher. History obtained from all three of them. Daughter concerned regarding increased anxiety. Feels needs something to help level things out. Not sure if taking sertraline . Discussed her recent falls. She does have caretakers through the day. Very involved. Daughter supportive. Discussed again my concern regarding staying at home. She is getting meals now - caretaker there to assure she is eating.  Also, helping her with ADLs. Discussed assisted living. She expressed a strong desire to stay at her home. Breathing stable. No vomiting or diarrhea reported.    Past Medical History:  Diagnosis Date   Allergic rhinitis    Anemia    Anxiety    Cellulitis    Cholelithiasis    Essential hypertension    History of stress test    a. 12/2008 Ex MV: EF 78%, no ischemia.   Insomnia    Osteoarthritis    Tachycardia    Valvular heart disease    a. 12/2008 Echo: EF 65%, mild MR;  b. 2/6 SEM RUSB - insignificant murmur, prob Ao Sclerosis.   Past Surgical History:  Procedure Laterality Date   BACK SURGERY     BUNIONECTOMY     CATARACT EXTRACTION W/PHACO Right 05/18/2019   Procedure: CATARACT EXTRACTION PHACO AND INTRAOCULAR LENS PLACEMENT (IOC) RIGHT MALYUGIN  01:06.5  20.6%  13.72;  Surgeon: Mittie Gaskin, MD;  Location: [facility];  Service: Ophthalmology;  Laterality: Right;  please leave patient arrival 10   CATARACT EXTRACTION W/PHACO  Left 06/08/2019   Procedure: CATARACT EXTRACTION PHACO AND INTRAOCULAR LENS PLACEMENT (IOC) LEFT  00:51.0  20.3%  10.37;  Surgeon: Mittie Gaskin, MD;  Location: [facility];  Service: Ophthalmology;  Laterality: Left;  ARRIVAL 10:30 PLEASE LEAVE   CHOLECYSTECTOMY  03/29/2007   ESOPHAGOGASTRODUODENOSCOPY N/A 07/20/2024   Procedure: EGD (ESOPHAGOGASTRODUODENOSCOPY);  Surgeon: Jinny Carmine, MD;  Location: [facility];  Service: Endoscopy;  Laterality: N/A;   GALLBLADDER SURGERY     HEMOSTASIS CLIP PLACEMENT  07/20/2024   Procedure: CONTROL OF HEMORRHAGE, GI TRACT, ENDOSCOPIC, BY CLIPPING OR OVERSEWING;  Surgeon: Jinny Carmine, MD;  Location: [facility] END[facility]COPY;  Service: Endoscopy;;   HIP SURGERY     bilateral    HOT HEMOSTASIS  07/20/2024   Procedure: EGD, WITH ARGON PLASMA COAGULATION;  Surgeon: Jinny Carmine, MD;  Location: [facility] ENDOSCOPY;  Service: Endoscopy;;   REPLACEMENT TOTAL KNEE Right    THUMB ARTHROSCOPY     Family History  Problem Relation Age of Onset   Heart attack Father    Stroke Father    Heart disease Father    CAD Father    Heart attack Son 13       MI   Hypertension Son    Hypertension Mother    Heart disease Mother    Stroke Mother    Breast cancer Neg Hx    Social History   Socioeconomic History   Marital status: Widowed  Spouse name: Not on file   Number of children: 1   Years of education: Not on file   Highest education level: Not on file  Occupational History   Not on file  Tobacco Use   Smoking status: Never   Smokeless tobacco: Never  Vaping Use   Vaping status: Never Used  Substance and Sexual Activity   Alcohol use: Yes    Comment: 2 oz QOD   Drug use: No   Sexual activity: Not Currently  Other Topics Concern   Not on file  Social History Narrative   Not on file   Social Drivers of Health   Tobacco Use: Low Risk (09/10/2024)   Patient History    Smoking Tobacco Use: Never    Smokeless Tobacco Use: Never    Passive  Exposure: Not on file  Financial Resource Strain: Patient Unable To Answer (08/22/2024)   Received from [facility]   Overall Financial Resource Strain (CARDIA)    Difficulty of Paying Living Expenses: Patient unable to answer  Food Insecurity: Patient Unable To Answer (08/22/2024)   Received from [facility]   Epic    Within the past 12 months, you worried that your food would run out before you got the money to buy more.: Patient unable to answer    Within the past 12 months, the food you bought just didn't last and you didn't have money to get more.: Patient unable to answer  Transportation Needs: Patient Unable To Answer (08/22/2024)   Received from [facility] - Transportation    In the past 12 months, has lack of transportation kept you from medical appointments or from getting medications?: Patient unable to answer    Lack of Transportation (Non-Medical): Patient unable to answer  Physical Activity: Insufficiently Active (10/04/2021)   Exercise Vital Sign    Days of Exercise per Week: 4 days    Minutes of Exercise per Session: 20 min  Stress: No Stress Concern Present (10/04/2021)   [employer] - Occupational Stress Questionnaire    Feeling of Stress : Not at all  Social Connections: Patient Unable To Answer (07/20/2024)   Social Connection and Isolation Panel    Frequency of Communication with Friends and Family: Patient unable to answer    Frequency of Social Gatherings with Friends and Family: Patient unable to answer    Attends Religious Services: Patient unable to answer    Active Member of Clubs or Organizations: Patient unable to answer    Attends Banker Meetings: Patient unable to answer    Marital Status: Patient unable to answer  Depression (PHQ2-9): Medium Risk (09/05/2024)   Depression (PHQ2-9)    PHQ-2 Score: 6  Alcohol Screen: Not on file  Housing: Patient Unable To  Answer (08/22/2024)   Received from [facility]   Epic    In the last 12 months, was there a time when you were not able to pay the mortgage or rent on time?: Patient unable to answer    In the past 12 months, how many times have you moved where you were living?: 0    At any time in the past 12 months, were you homeless or living in a shelter (including now)?: Patient unable to answer  Utilities: Patient Unable To Answer (08/22/2024)   Received from [facility]   Epic    In the past 12 months has the electric,  gas, oil, or water company threatened to shut off services in your home?: Patient unable to answer  Health Literacy: Not on file     Review of Systems  Constitutional:  Negative for appetite change.       Weight stable from recent check.   HENT:  Negative for congestion and sinus pressure.   Respiratory:  Negative for cough, chest tightness and shortness of breath.   Cardiovascular:  Negative for chest pain and palpitations.       No increased swelling   Gastrointestinal:  Negative for abdominal pain, diarrhea, nausea and vomiting.  Genitourinary:  Negative for difficulty urinating and dysuria.  Musculoskeletal:  Negative for joint swelling and myalgias.  Skin:  Negative for color change and rash.  Neurological:  Negative for dizziness and headaches.  Psychiatric/Behavioral:         Increased stress. Anxiety. Some agitation.        Objective:     BP 126/70   Pulse 93   Temp 98.4 F (36.9 C) (Oral)   Ht 5' 2 (1.575 m)   Wt 114 lb 12.8 oz (52.1 kg)   SpO2 97%   BMI 21.00 kg/m  Wt Readings from Last 3 Encounters:  09/05/24 115 lb (52.2 kg)  09/02/24 114 lb 12.8 oz (52.1 kg)  08/09/24 122 lb 9.6 oz (55.6 kg)    Physical Exam Vitals reviewed.  Constitutional:      General: She is not in acute distress.    Appearance: Normal appearance.  HENT:     Head: Normocephalic and atraumatic.     Right Ear: External ear normal.     Left  Ear: External ear normal.     Mouth/Throat:     Pharynx: No oropharyngeal exudate or posterior oropharyngeal erythema.  Eyes:     General: No scleral icterus.       Right eye: No discharge.        Left eye: No discharge.     Conjunctiva/sclera: Conjunctivae normal.  Neck:     Thyroid : No thyromegaly.  Cardiovascular:     Rate and Rhythm: Normal rate and regular rhythm.  Pulmonary:     Effort: No respiratory distress.     Breath sounds: Normal breath sounds. No wheezing.  Abdominal:     General: Bowel sounds are normal.     Palpations: Abdomen is soft.     Tenderness: There is no abdominal tenderness.  Musculoskeletal:        General: No swelling or tenderness.     Cervical back: Neck supple. No tenderness.  Lymphadenopathy:     Cervical: No cervical adenopathy.  Skin:    Findings: No erythema or rash.  Neurological:     Mental Status: She is alert.  Psychiatric:        Mood and Affect: Mood normal.        Behavior: Behavior normal.         Outpatient Encounter Medications as of 09/02/2024  Medication Sig   acetaminophen  (TYLENOL ) 500 MG tablet Take 1,000 mg by mouth every 8 (eight) hours as needed for mild pain or moderate pain.   amLODipine  (NORVASC ) 5 MG tablet Take 1 tablet (5 mg total) by mouth daily.   aspirin  81 MG EC tablet Take 81 mg by mouth daily. Swallow whole.   escitalopram  (LEXAPRO ) 5 MG tablet Take 1 tablet (5 mg total) by mouth daily.   omeprazole  (PRILOSEC) 40 MG capsule Take 40 mg by mouth.   [DISCONTINUED] atorvastatin  (LIPITOR)  10 MG tablet Take 1 tablet (10 mg total) by mouth daily.   [DISCONTINUED] pantoprazole  (PROTONIX ) 40 MG tablet Take 1 tablet (40 mg total) by mouth 2 (two) times daily before a meal.   [DISCONTINUED] sertraline  (ZOLOFT ) 25 MG tablet Take 1 tablet (25 mg total) by mouth daily.   [DISCONTINUED] traMADol (ULTRAM) 50 MG tablet Take 50 mg by mouth.   cyanocobalamin  (VITAMIN B12) 1000 MCG tablet Take 1,000 mcg by mouth daily.    [DISCONTINUED] cholecalciferol  (VITAMIN D ) 1000 UNITS tablet Take 1,000 Units by mouth every other day.  (Patient not taking: Reported on 09/02/2024)   [DISCONTINUED] lidocaine  (LIDODERM ) 5 % Place 1 patch onto the skin daily. Remove & Discard patch within 12 hours or as directed by MD (Patient not taking: Reported on 09/02/2024)   No facility-administered encounter medications on file as of 09/02/2024.     Lab Results  Component Value Date   WBC 18.0 (H) 08/09/2024   HGB 9.2 (L) 08/09/2024   HCT 28.9 (L) 08/09/2024   PLT 488 (H) 08/09/2024   GLUCOSE 83 08/09/2024   CHOL 120 07/29/2024   TRIG 117 07/29/2024   HDL 52 07/29/2024   LDLDIRECT 66.0 09/07/2018   LDLCALC 47 07/29/2024   ALT 9 08/09/2024   AST 16 08/09/2024   NA 140 08/09/2024   K 3.5 08/09/2024   CL 104 08/09/2024   CREATININE 1.13 (H) 08/09/2024   BUN 16 08/09/2024   CO2 23 08/09/2024   TSH 3.220 08/09/2024   INR 1.1 07/20/2024   HGBA1C 5.2 07/29/2024    US  Venous Img Lower Bilateral (DVT) Result Date: 08/09/2024 EXAM: ULTRASOUND DUPLEX OF THE BILATERAL LOWER EXTREMITY VEINS TECHNIQUE: Duplex ultrasound using B-mode/gray scaled imaging and Doppler spectral analysis and color flow was obtained of the deep venous structures of the bilateral lower extremity. COMPARISON: 01/09/2018 CLINICAL HISTORY: bilateral leg swelling FINDINGS: **LEFT:** The common femoral vein, femoral vein, popliteal vein, and posterior tibial vein demonstrate normal compressibility with normal color flow and spectral analysis. The left peroneal veins were not well visualized or evaluated due to patient's body habitus and subcutaneous edema. Likely Baker cyst in the left popliteal fossa measuring up to 6.8 cm. **RIGHT:** The common femoral vein, femoral vein, popliteal vein, and posterior tibial vein demonstrate normal compressibility with normal color flow and spectral analysis. The right peroneal veins were not well visualized or evaluated due to  patient's body habitus and subcutaneous edema. IMPRESSION: 1. Negative for deep venous thrombosis within the evaluated veins of both legs. Electronically signed by: Rogelia Myers MD 08/09/2024 06:02 PM EST RP Workstation: [id number]       Assessment & Plan:  Anemia, unspecified type Assessment & Plan: Recent admision with GI bleed. GI evaluated - EGD 07/20/24 - nonbleeding gastric ulcer with adherent clot which was treated with APC and two bleeding angioplastic lesions in the duodenum - treated with APC as well. Also, non bleeding duodenal ulcer. Recommended to continue PPI. Transfused - 7.1 - 8.3 hgb.  saw GI 08/22/24 - f/u IDA. Deferred colonoscopy. With recent gastric ulcer - H. Pylori breath test ordered and was negative. PPI.[name] discussed taking prilosec 40mg  q day and holding protonix . Follow cbc.    Anxiety Assessment & Plan: Discussed increased stress/anxiety. Discussed treatment options. Discussed risk of falls. Will stop sertraline . Start lexapro  5mg  q day. Titrate up as needed. Follow.    Stage 3a chronic kidney disease (HCC) Assessment & Plan: Continue to avoid antiinflammatories.  Stay hydrated. Follow metabolic panel.  Depression with anxiety Assessment & Plan: Change sertraline  to lexapro . Start 5mg  q day. Titrate if needed.    Essential hypertension Assessment & Plan: Ave her continue amlodipine . Follow pressures. Follow metabolic panel.    Falls Assessment & Plan: Discussed recent falls discussed assisted living. Has caretaker throughout the day. Assisting her with ADLs. Follow.    Hyperlipidemia, unspecified hyperlipidemia type Assessment & Plan: Continue lipitor. Follow lipid panel.    Hyperglycemia Assessment & Plan: Follow met b and A1c.    PAD (peripheral artery disease) Assessment & Plan: Continue lipitor and aspirin .    Valvular heart disease Assessment & Plan: Had echo - recent hospitalization. Normal EF. Follow.    Dementia without  behavioral disturbance [facility]) Assessment & Plan: Saw neurology 06/13/24 - mild late-onset dementia.  Will treat depression/anxiety. Stop sertraline . Start lexapro  as directed. Follow. Continues amlodipine .    Other orders -     Escitalopram  Oxalate; Take 1 tablet (5 mg total) by mouth daily.  Dispense: 30 tablet; Refill: 2  I spent 45 minutes with the patient, her daughter and her caretaker.  Time spent discussing her current concerns and symptoms. Specifically time spent discussing her increased anxiety, falls and assisted living.  Time also spent discussing further w/up, evaluation and treatment.    Allena Hamilton, MD "

## 2024-09-02 NOTE — Telephone Encounter (Signed)
 Verbal orders given

## 2024-09-02 NOTE — Patient Instructions (Signed)
 Stop pantoprazole   Continue omeprazole  40mg  - one per day.   See if taking amlodipine  5mg  - one per day  Stop sertraline .   Start lexapro  5mg  - take one per day - take in the morning.   Stop tramadol.

## 2024-09-05 ENCOUNTER — Inpatient Hospital Stay: Attending: Oncology | Admitting: Oncology

## 2024-09-05 ENCOUNTER — Other Ambulatory Visit: Payer: Self-pay | Admitting: Cardiovascular Disease

## 2024-09-05 ENCOUNTER — Inpatient Hospital Stay

## 2024-09-05 ENCOUNTER — Encounter: Payer: Self-pay | Admitting: Oncology

## 2024-09-05 VITALS — BP 132/67 | HR 80 | Temp 98.2°F | Ht 62.0 in | Wt 115.0 lb

## 2024-09-05 VITALS — BP 165/65 | HR 94

## 2024-09-05 DIAGNOSIS — D72829 Elevated white blood cell count, unspecified: Secondary | ICD-10-CM | POA: Diagnosis not present

## 2024-09-05 DIAGNOSIS — D649 Anemia, unspecified: Secondary | ICD-10-CM | POA: Diagnosis not present

## 2024-09-05 DIAGNOSIS — D508 Other iron deficiency anemias: Secondary | ICD-10-CM | POA: Diagnosis not present

## 2024-09-05 DIAGNOSIS — D509 Iron deficiency anemia, unspecified: Secondary | ICD-10-CM | POA: Insufficient documentation

## 2024-09-05 MED ORDER — IRON SUCROSE 20 MG/ML IV SOLN
200.0000 mg | INTRAVENOUS | Status: DC
  Administered 2024-09-05: 200 mg via INTRAVENOUS
  Filled 2024-09-05: qty 10

## 2024-09-05 NOTE — Progress Notes (Signed)
 "    Hematology/Oncology Consult note [facility]  [phone] [phone]  Patient Care Team: Glendia Shad, MD as PCP - General (Internal Medicine) Perla Evalene PARAS, MD as PCP - Cardiology (Cardiology) Melanee Annah BROCKS, MD as Consulting Physician (Oncology)   Name of the [name]  [id number]  [birth year]   Date of visit: 09/05/24  Diagnosis- 1.  Leukocytosis mainly neutrophilia likely reactive 2.  Monoclonal B-cell lymphocytosis of unclear significance 3.  Iron  deficiency anemia  Chief complaint/ Reason for visit-discuss results of blood work  Heme/Onc history: patient is a 89 year old female with a past medical history significant for hypertension hyperlipidemia, atherosclerosis, dementia, peripheral arterial disease among other medical problems.She has been referred for leukocytosis.  Labs from 07/29/2024 showed white cell count of 28.5, H&H of 9.7/29.8 and a platelet count of 497.  Differential mainly showed neutrophilia with an absolute neutrophil count of 24 and some evidence of monocytosis.  Looking back at her prior CBCs patient has had mild intermittent leukocytosis with a white cell count which has mainly fluctuated between 11-12 up until August 2025.  Over the last 1 month her peak white count has been between 19-30.  Baseline hemoglobin typically runs around 12 and over the last 1 month her hemoglobin has been around 9-10.   Patient had an CT abdomen pelvis without contrast on 07/18/2024 which showed diverticulosis of the colon without any active inflammation.  MRI brain showed age-related cerebral atrophy and microhemorrhages chronic.  Patient also had a CT angio to rule out any bleeding which did not show any evidence of active GI bleeding.  Chest x-ray did not show any acute cardiopulmonary abnormality.   Discussed the use of AI scribe software for clinical note transcription with the patient, who gave verbal consent to  proceed.   She was recently hospitalized, during which a CT scan of the abdomen and a chest x-ray showed no evidence of infection or acute pathology. She denies fevers, infectious symptoms, respiratory complaints, or abdominal complaints.   She has longstanding osteoarthritis, which she describes as her most significant ongoing problem. She experiences intermittent joint pain in various locations, without significant worsening over the past month. She has undergone multiple joint replacements and reports no new or worsening joint symptoms.      Results of blood work from 08/09/2024 showed a white cell count of 18, H&H of 9.2/28.9 with an platelet count of 488.  BCR-ABL FISH testing negative.  Flow cytometry showed CD5 positive CD20 positive CD23 positive FMC7 clonal B-cell population involving less than 1% of leukocytes.  Myeloma panel showed no M protein.  BCR-ABL FISH testing negative.  C-reactive protein mildly elevated at 1.6.  ESR elevated at 34.  Ferritin level normal at 72 with an iron  saturation of 6%.  TSH normal.  Both kappa and lambda light chains were elevated with a normal free light chain ratio.  Haptoglobin normal.  Interval history-patient is hard of hearing and presently reports ongoing chronic fatigue and joint pain.  Denies any new complaints at this time  ECOG PS- 2 Pain scale- 3 Opioid associated constipation- no  Review of systems- Review of Systems  Constitutional:  Negative for chills, fever, malaise/fatigue and weight loss.  HENT:  Negative for congestion, ear discharge and nosebleeds.   Eyes:  Negative for blurred vision.  Respiratory:  Negative for cough, hemoptysis, sputum production, shortness of breath and wheezing.   Cardiovascular:  Negative for chest pain, palpitations, orthopnea and claudication.  Gastrointestinal:  Negative for abdominal pain, blood in stool, constipation, diarrhea, heartburn, melena, nausea and vomiting.  Genitourinary:  Negative for dysuria,  flank pain, frequency, hematuria and urgency.  Musculoskeletal:  Positive for joint pain. Negative for back pain and myalgias.  Skin:  Negative for rash.  Neurological:  Negative for dizziness, tingling, focal weakness, seizures, weakness and headaches.  Endo/Heme/Allergies:  Does not bruise/bleed easily.  Psychiatric/Behavioral:  Negative for depression and suicidal ideas. The patient does not have insomnia.       Allergies[1]   Past Medical History:  Diagnosis Date   Allergic rhinitis    Anemia    Anxiety    Cellulitis    Cholelithiasis    Essential hypertension    History of stress test    a. 12/2008 Ex MV: EF 78%, no ischemia.   Insomnia    Osteoarthritis    Tachycardia    Valvular heart disease    a. 12/2008 Echo: EF 65%, mild MR;  b. 2/6 SEM RUSB - insignificant murmur, prob Ao Sclerosis.     Past Surgical History:  Procedure Laterality Date   BACK SURGERY     BUNIONECTOMY     CATARACT EXTRACTION W/PHACO Right 05/18/2019   Procedure: CATARACT EXTRACTION PHACO AND INTRAOCULAR LENS PLACEMENT (IOC) RIGHT MALYUGIN  01:06.5  20.6%  13.72;  Surgeon: Mittie Gaskin, MD;  Location: [facility];  Service: Ophthalmology;  Laterality: Right;  please leave patient arrival 10   CATARACT EXTRACTION W/PHACO Left 06/08/2019   Procedure: CATARACT EXTRACTION PHACO AND INTRAOCULAR LENS PLACEMENT (IOC) LEFT  00:51.0  20.3%  10.37;  Surgeon: Mittie Gaskin, MD;  Location: [facility];  Service: Ophthalmology;  Laterality: Left;  ARRIVAL 10:30 PLEASE LEAVE   CHOLECYSTECTOMY  03/29/2007   ESOPHAGOGASTRODUODENOSCOPY N/A 07/20/2024   Procedure: EGD (ESOPHAGOGASTRODUODENOSCOPY);  Surgeon: Jinny Carmine, MD;  Location: [facility];  Service: Endoscopy;  Laterality: N/A;   GALLBLADDER SURGERY     HEMOSTASIS CLIP PLACEMENT  07/20/2024   Procedure: CONTROL OF HEMORRHAGE, GI TRACT, ENDOSCOPIC, BY CLIPPING OR OVERSEWING;  Surgeon: Jinny Carmine, MD;  Location: [facility] END[facility];   Service: Endoscopy;;   HIP SURGERY     bilateral    HOT HEMOSTASIS  07/20/2024   Procedure: EGD, WITH ARGON PLASMA COAGULATION;  Surgeon: Jinny Carmine, MD;  Location: [facility] ENDOSCOPY;  Service: Endoscopy;;   REPLACEMENT TOTAL KNEE Right    THUMB ARTHROSCOPY      Social History   Socioeconomic History   Marital status: Widowed    Spouse name: Not on file   Number of children: 1   Years of education: Not on file   Highest education level: Not on file  Occupational History   Not on file  Tobacco Use   Smoking status: Never   Smokeless tobacco: Never  Vaping Use   Vaping status: Never Used  Substance and Sexual Activity   Alcohol use: Yes    Comment: 2 oz QOD   Drug use: No   Sexual activity: Not Currently  Other Topics Concern   Not on file  Social History Narrative   Not on file   Social Drivers of Health   Tobacco Use: Low Risk (09/05/2024)   Patient History    Smoking Tobacco Use: Never    Smokeless Tobacco Use: Never    Passive Exposure: Not on file  Financial Resource Strain: Patient Unable To Answer (08/22/2024)   Received from [facility]   Overall Financial Resource Strain (CARDIA)    Difficulty of Paying  Living Expenses: Patient unable to answer  Food Insecurity: Patient Unable To Answer (08/22/2024)   Received from [facility]   Epic    Within the past 12 months, you worried that your food would run out before you got the money to buy more.: Patient unable to answer    Within the past 12 months, the food you bought just didn't last and you didn't have money to get more.: Patient unable to answer  Transportation Needs: Patient Unable To Answer (08/22/2024)   Received from [facility] - Transportation    In the past 12 months, has lack of transportation kept you from medical appointments or from getting medications?: Patient unable to answer    Lack of Transportation (Non-Medical): Patient unable to  answer  Physical Activity: Insufficiently Active (10/04/2021)   Exercise Vital Sign    Days of Exercise per Week: 4 days    Minutes of Exercise per Session: 20 min  Stress: No Stress Concern Present (10/04/2021)   [employer]ational [employer] - Occupational Stress Questionnaire    Feeling of Stress : Not at all  Social Connections: Patient Unable To Answer (07/20/2024)   Social Connection and Isolation Panel    Frequency of Communication with Friends and Family: Patient unable to answer    Frequency of Social Gatherings with Friends and Family: Patient unable to answer    Attends Religious Services: Patient unable to answer    Active Member of Clubs or Organizations: Patient unable to answer    Attends Banker Meetings: Patient unable to answer    Marital Status: Patient unable to answer  Intimate Partner Violence: Not At Risk (08/09/2024)   Epic    Fear of Current or Ex-Partner: No    Emotionally Abused: No    Physically Abused: No    Sexually Abused: No  Depression (PHQ2-9): Medium Risk (09/05/2024)   Depression (PHQ2-9)    PHQ-2 Score: 6  Alcohol Screen: Not on file  Housing: Patient Unable To Answer (08/22/2024)   Received from [facility]   Epic    In the last 12 months, was there a time when you were not able to pay the mortgage or rent on time?: Patient unable to answer    In the past 12 months, how many times have you moved where you were living?: 0    At any time in the past 12 months, were you homeless or living in a shelter (including now)?: Patient unable to answer  Utilities: Patient Unable To Answer (08/22/2024)   Received from [facility]   Epic    In the past 12 months has the electric, gas, oil, or water company threatened to shut off services in your home?: Patient unable to answer  Health Literacy: Not on file    Family History  Problem Relation Age of Onset   Heart attack Father    Stroke Father     Heart disease Father    CAD Father    Heart attack Son 81       MI   Hypertension Son    Hypertension Mother    Heart disease Mother    Stroke Mother    Breast cancer Neg Hx     Current Medications[2]  Physical exam:  Vitals:   09/05/24 1317 09/05/24 1334  BP: (!) 154/69 132/67  Pulse: 80   Temp: 98.2 F (36.8 C)   TempSrc: Tympanic  SpO2: 98%   Weight: 115 lb (52.2 kg)   Height: 5' 2 (1.575 m)    Physical Exam Constitutional:      Comments: Sitting in wheelchair.  Appears in no acute distress  Cardiovascular:     Rate and Rhythm: Normal rate and regular rhythm.     Heart sounds: Normal heart sounds.  Pulmonary:     Effort: Pulmonary effort is normal.     Breath sounds: Normal breath sounds.  Skin:    General: Skin is warm and dry.  Neurological:     Mental Status: She is alert and oriented to person, place, and time.      I have personally reviewed labs listed below:    Latest Ref Rng & Units 08/09/2024    3:14 PM  CMP  Glucose 70 - 99 mg/dL 83   BUN 8 - 23 mg/dL 16   Creatinine 9.55 - 1.00 mg/dL 8.86   Sodium 864 - 854 mmol/L 140   Potassium 3.5 - 5.1 mmol/L 3.5   Chloride 98 - 111 mmol/L 104   CO2 22 - 32 mmol/L 23   Calcium  8.9 - 10.3 mg/dL 9.2   Total Protein 6.5 - 8.1 g/dL 5.7   Total Bilirubin 0.0 - 1.2 mg/dL 0.3   Alkaline Phos 38 - 126 U/L 91   AST 15 - 41 U/L 16   ALT 0 - 44 U/L 9       Latest Ref Rng & Units 08/09/2024    3:14 PM  CBC  WBC 4.0 - 10.5 K/uL 18.0   Hemoglobin 12.0 - 15.0 g/dL 9.2   Hematocrit 63.9 - 46.0 % 28.9   Platelets 150 - 400 K/uL 488    I have personally reviewed Radiology images listed below: No images are attached to the encounter.  US  Venous Img Lower Bilateral (DVT) Result Date: 08/09/2024 EXAM: ULTRASOUND DUPLEX OF THE BILATERAL LOWER EXTREMITY VEINS TECHNIQUE: Duplex ultrasound using B-mode/gray scaled imaging and Doppler spectral analysis and color flow was obtained of the deep venous structures of the  bilateral lower extremity. COMPARISON: 01/09/2018 CLINICAL HISTORY: bilateral leg swelling FINDINGS: **LEFT:** The common femoral vein, femoral vein, popliteal vein, and posterior tibial vein demonstrate normal compressibility with normal color flow and spectral analysis. The left peroneal veins were not well visualized or evaluated due to patient's body habitus and subcutaneous edema. Likely Baker cyst in the left popliteal fossa measuring up to 6.8 cm. **RIGHT:** The common femoral vein, femoral vein, popliteal vein, and posterior tibial vein demonstrate normal compressibility with normal color flow and spectral analysis. The right peroneal veins were not well visualized or evaluated due to patient's body habitus and subcutaneous edema. IMPRESSION: 1. Negative for deep venous thrombosis within the evaluated veins of both legs. Electronically signed by: Rogelia Myers MD 08/09/2024 06:02 PM EST RP Workstation: [id number]     Assessment and plan- Patient is a 89 y.o. female here for follow-up of following issues:  Assessment and Plan    Leukocytosis mainly neutrophilia Persistent leukocytosis improved from 28 to 18, likely reactive to recent hospitalization and inflammation.  Although flow cytometry showed less than 1% leukocytes which were clonal CD5 positive CD10 positive and CD23 positive, this likely indicates monoclonal B-cell lymphocytosis which is present at a low level and is not contributing to her leukocytosis which is mainly neutrophilia.  This appears to be reactive and we will continue to monitor for now  Iron  deficiency anemia Iron  deficiency anemia with hemoglobin improved from 7.1 to  9.2 g/dL.  Ferritin levels were normal at 72 but since iron  saturation is low at 6% I would like to offer her IV iron  at this time.  IV iron  expected to improve hemoglobin and iron  indices, enhancing energy and mood. Rare allergic reactions possible. - Administered IV iron  infusion, brand selection dependent  on insurance approval.  Discussed acetaminophens of IV iron  including all but not limited to possible risk of infusion anaphylactic reaction.  Patient understands and agrees to proceed as planned - Assessed eligibility for single-dose Monoferric versus alternative brands requiring multiple doses.     CBC with differential ferritin and iron  studies and see me in 2 months    Visit Diagnosis 1. Normocytic anemia   2. Other iron  deficiency anemia      Dr. Annah Skene, MD, MPH [facility] [phone] 09/05/2024 2:55 PM                   [1]  Allergies Allergen Reactions   Clindamycin /Lincomycin    Egg Protein (Egg White)    Egg Protein-Containing Drug Products    Flagyl  [Metronidazole ]    Levaquin [Levofloxacin]    Mobic  [Meloxicam ]     GI issues    Penicillins    Sulfa Antibiotics   [2]  Current Outpatient Medications:    acetaminophen  (TYLENOL ) 500 MG tablet, Take 1,000 mg by mouth every 8 (eight) hours as needed for mild pain or moderate pain., Disp: , Rfl:    amLODipine  (NORVASC ) 5 MG tablet, Take 1 tablet (5 mg total) by mouth daily., Disp: 15 tablet, Rfl: 0   aspirin  81 MG EC tablet, Take 81 mg by mouth daily. Swallow whole., Disp: , Rfl:    atorvastatin  (LIPITOR) 10 MG tablet, Take 1 tablet (10 mg total) by mouth daily., Disp: 15 tablet, Rfl: 0   cyanocobalamin  (VITAMIN B12) 1000 MCG tablet, Take 1,000 mcg by mouth daily., Disp: , Rfl:    escitalopram  (LEXAPRO ) 5 MG tablet, Take 1 tablet (5 mg total) by mouth daily., Disp: 30 tablet, Rfl: 2   omeprazole  (PRILOSEC) 40 MG capsule, Take 40 mg by mouth., Disp: , Rfl:  No current facility-administered medications for this visit.  Facility-Administered Medications Ordered in Other Visits:    iron  sucrose (VENOFER ) injection 200 mg, 200 mg, Intravenous, Weekly, Deepti Gunawan C, MD, 200 mg at 09/05/24 1443  "

## 2024-09-05 NOTE — Patient Instructions (Signed)

## 2024-09-10 ENCOUNTER — Encounter: Payer: Self-pay | Admitting: Internal Medicine

## 2024-09-10 NOTE — Assessment & Plan Note (Signed)
Continue lipitor and aspirin.  ?

## 2024-09-10 NOTE — Assessment & Plan Note (Signed)
 Change sertraline  to lexapro . Start 5mg  q day. Titrate if needed.

## 2024-09-10 NOTE — Assessment & Plan Note (Signed)
 Continue to avoid antiinflammatories.  Stay hydrated.  Follow metabolic panel.

## 2024-09-10 NOTE — Assessment & Plan Note (Signed)
 Follow met b and A1c.

## 2024-09-10 NOTE — Assessment & Plan Note (Signed)
 Discussed increased stress/anxiety. Discussed treatment options. Discussed risk of falls. Will stop sertraline . Start lexapro  5mg  q day. Titrate up as needed. Follow.

## 2024-09-10 NOTE — Assessment & Plan Note (Signed)
 Discussed recent falls discussed assisted living. Has caretaker throughout the day. Assisting her with ADLs. Follow.

## 2024-09-10 NOTE — Assessment & Plan Note (Signed)
 Continue lipitor. Follow lipid panel.

## 2024-09-10 NOTE — Assessment & Plan Note (Signed)
 Saw neurology 06/13/24 - mild late-onset dementia.  Will treat depression/anxiety. Stop sertraline . Start lexapro  as directed. Follow. Continues amlodipine .

## 2024-09-10 NOTE — Assessment & Plan Note (Signed)
 Had echo - recent hospitalization. Normal EF. Follow.

## 2024-09-10 NOTE — Assessment & Plan Note (Signed)
 Recent admision with GI bleed. GI evaluated - EGD 07/20/24 - nonbleeding gastric ulcer with adherent clot which was treated with APC and two bleeding angioplastic lesions in the duodenum - treated with APC as well. Also, non bleeding duodenal ulcer. Recommended to continue PPI. Transfused - 7.1 - 8.3 hgb.  saw GI 08/22/24 - f/u IDA. Deferred colonoscopy. With recent gastric ulcer - H. Pylori breath test ordered and was negative. PPI.Alicia Mccann discussed taking prilosec 40mg  q day and holding protonix . Follow cbc.

## 2024-09-10 NOTE — Assessment & Plan Note (Signed)
 Ave her continue amlodipine . Follow pressures. Follow metabolic panel.

## 2024-09-12 ENCOUNTER — Inpatient Hospital Stay

## 2024-09-13 ENCOUNTER — Telehealth: Payer: Self-pay

## 2024-09-13 NOTE — Telephone Encounter (Signed)
 Verbal orders given

## 2024-09-13 NOTE — Telephone Encounter (Signed)
Ok to give verbal orders for therapy?

## 2024-09-13 NOTE — Telephone Encounter (Signed)
 Copied from CRM [id number]. Topic: Clinical - Home Health Verbal Orders >> Sep 12, 2024  5:35 PM Charolett CROME wrote: Caller/Agency: [facility] Callback Number: [phone] Service Requested: Occupational Therapy and Physical Therapy Frequency: PT 1 week 9, OT 1 week 1 Any new concerns about the patient? No Secured VM

## 2024-09-14 ENCOUNTER — Encounter: Payer: Self-pay | Admitting: Cardiovascular Disease

## 2024-09-16 ENCOUNTER — Inpatient Hospital Stay

## 2024-09-16 VITALS — BP 161/91 | HR 81 | Temp 98.7°F | Resp 17

## 2024-09-16 DIAGNOSIS — D508 Other iron deficiency anemias: Secondary | ICD-10-CM

## 2024-09-16 DIAGNOSIS — D649 Anemia, unspecified: Secondary | ICD-10-CM | POA: Diagnosis not present

## 2024-09-16 MED ORDER — IRON SUCROSE 20 MG/ML IV SOLN
200.0000 mg | INTRAVENOUS | Status: DC
  Administered 2024-09-16: 200 mg via INTRAVENOUS
  Filled 2024-09-16: qty 10

## 2024-09-16 MED ORDER — SODIUM CHLORIDE 0.9% FLUSH
10.0000 mL | Freq: Once | INTRAVENOUS | Status: AC | PRN
  Administered 2024-09-16: 10 mL
  Filled 2024-09-16: qty 10

## 2024-09-16 NOTE — Patient Instructions (Signed)
 Iron  Sucrose Injection What is this medication? IRON  SUCROSE (EYE ern SOO krose) treats low levels of iron  (iron  deficiency anemia) in people with kidney disease. Iron  is a mineral that plays an important role in making red blood cells, which carry oxygen from your lungs to the rest of your body. This medicine may be used for other purposes; ask your health care provider or pharmacist if you have questions. COMMON BRAND NAME(S): Venofer  What should I tell my care team before I take this medication? They need to know if you have any of these conditions: Anemia not caused by low iron  levels Heart disease High levels of iron  in the blood Kidney disease Liver disease An unusual or allergic reaction to iron , other medications, foods, dyes, or preservatives Pregnant or trying to get pregnant Breastfeeding How should I use this medication? This medication is infused into a vein. It is given by your care team in a hospital or clinic setting. Talk to your care team about the use of this medication in children. While it may be prescribed for children as young as 2 years for selected conditions, precautions do apply. Overdosage: If you think you have taken too much of this medicine contact a poison control center or emergency room at once. NOTE: This medicine is only for you. Do not share this medicine with others. What if I miss a dose? Keep appointments for follow-up doses. It is important not to miss your dose. Call your care team if you are unable to keep an appointment. What may interact with this medication? Do not take this medication with any of the following: Deferoxamine Dimercaprol Other iron  products This medication may also interact with the following: Chloramphenicol Deferasirox This list may not describe all possible interactions. Give your health care provider a list of all the medicines, herbs, non-prescription drugs, or dietary supplements you use. Also tell them if you smoke,  drink alcohol, or use illegal drugs. Some items may interact with your medicine. What should I watch for while using this medication? Your condition will be monitored carefully while you are receiving this medication. Tell your care team if your symptoms do not start to get better or if they get worse. You may need blood work done while you are taking this medication. Sometimes, when medications are infused into veins, a little can leak out of the vein and into the tissue around it. If this medication leaks, it can cause a brown or dark stain on the skin. This is not common. It may be permanent. If you feel pain or swelling during your infusion, tell your care team right away. They can stop the infusion and treat the area. You may need to eat more foods that contain iron . Talk to your care team. Foods that contain iron  include whole grains or cereals, dried fruits, beans, peas, leafy green vegetables, and organ meats (liver, kidney). What side effects may I notice from receiving this medication? Side effects that you should report to your care team as soon as possible: Allergic reactions--skin rash, itching, hives, swelling of the face, lips, tongue, or throat Low blood pressure--dizziness, feeling faint or lightheaded, blurry vision Painful swelling, warmth, or redness of the skin, brown or dark skin color at the infusion site Shortness of breath Side effects that usually do not require medical attention (report these to your care team if they continue or are bothersome): Flushing Headache Joint pain Muscle pain Nausea This list may not describe all possible side effects. Call your  doctor for medical advice about side effects. You may report side effects to FDA at 1-800-FDA-1088. Where should I keep my medication? This medication is given in a hospital or clinic. It will not be stored at home. NOTE: This sheet is a summary. It may not cover all possible information. If you have questions about  this medicine, talk to your doctor, pharmacist, or health care provider.  2025 Elsevier/Gold Standard (2024-06-22 00:00:00)

## 2024-09-16 NOTE — Progress Notes (Signed)
 Patient tolerated Venofer  infusion well. Explained recommendation of 30 min post monitoring. Patient refused to wait post monitoring. Educated on what signs to watch for & to call with any concerns. No questions, discharged. Stable

## 2024-09-19 ENCOUNTER — Other Ambulatory Visit: Payer: Self-pay | Admitting: Cardiovascular Disease

## 2024-09-19 ENCOUNTER — Inpatient Hospital Stay

## 2024-09-19 ENCOUNTER — Encounter: Payer: Self-pay | Admitting: Internal Medicine

## 2024-09-20 ENCOUNTER — Encounter: Admitting: Internal Medicine

## 2024-09-21 NOTE — Telephone Encounter (Signed)
 Lvm for daughter to give office a call back. Okay to relay message. Need to know what daughter is needing

## 2024-09-22 ENCOUNTER — Telehealth: Payer: Self-pay

## 2024-09-22 NOTE — Telephone Encounter (Signed)
 Spoke to pt's daughter. Daughter stated that pt has gotten worse in the last 2 weeks.daughter states pt is better in the mornings but as the day goes by pt's behavior gets bad.  Stated that pt has been very  agitated and paranoid. Daughter stated that pt has begone throwing things and putting furniture in front of doors. Asked daughter if pt was showing signs of a poss utii daughter declined that she hadn't noticed any increased in urinary frequency. Daughter would like some suggestions to help with pt. Advised daughter I would let pcp know and that pcp may not be able to call her back but the cma would. Daughter stated she understood and is willing to speak with whomever to get some relief for pt's behavior.

## 2024-09-22 NOTE — Telephone Encounter (Signed)
 Copied from CRM #[id number]. Topic: General - Other >> Sep 22, 2024  1:46 PM Alicia Mccann wrote: Reason for CRM: Daughter is returning phone call to Alicia Mccann about her mother. She did not tell me what this was about, but that she wanted to speak to the PCP about her mother. Called [facility] to see if Alicia Mccann was available and was informed she is not available. Please call daughter back to get further information. The only thing she would tell me is that it is not for an appt.  [place] [phone]

## 2024-09-23 ENCOUNTER — Encounter: Payer: Self-pay | Admitting: Student

## 2024-09-23 ENCOUNTER — Ambulatory Visit: Admitting: Student

## 2024-09-23 VITALS — BP 118/60 | HR 83 | Ht 62.0 in | Wt 117.2 lb

## 2024-09-23 DIAGNOSIS — E782 Mixed hyperlipidemia: Secondary | ICD-10-CM

## 2024-09-23 DIAGNOSIS — I700 Atherosclerosis of aorta: Secondary | ICD-10-CM

## 2024-09-23 DIAGNOSIS — I10 Essential (primary) hypertension: Secondary | ICD-10-CM

## 2024-09-23 DIAGNOSIS — I2510 Atherosclerotic heart disease of native coronary artery without angina pectoris: Secondary | ICD-10-CM

## 2024-09-23 MED ORDER — TRIAMTERENE-HCTZ 37.5-25 MG PO CAPS: 1.0000 | ORAL_CAPSULE | Freq: Every day | ORAL | 3 refills | Status: AC

## 2024-09-23 MED ORDER — AMLODIPINE BESYLATE 5 MG PO TABS: 5.0000 mg | ORAL_TABLET | Freq: Every day | ORAL | 3 refills | Status: AC

## 2024-09-23 MED ORDER — BUSPIRONE HCL 5 MG PO TABS: ORAL_TABLET | ORAL | 2 refills | Status: AC

## 2024-09-23 MED ORDER — ATORVASTATIN CALCIUM 10 MG PO TABS: 10.0000 mg | ORAL_TABLET | Freq: Every day | ORAL | 3 refills | Status: AC

## 2024-09-23 NOTE — Progress Notes (Signed)
 "  Cardiology Clinic Note   Date: 09/23/2024 ID: Alicia Mccann, DOB 1932/12/18, MRN [id number]  Primary Cardiologist:  Evalene Lunger, MD  Chief Complaint   Alicia Mccann is a 89 y.o. female who presents to the clinic today for overdue routine follow up.   Patient Profile   Alicia Mccann is followed by Dr. Gollan for the history outlined below.      Past medical history significant for: Coronary artery calcification/aortic atherosclerosis. CT chest 03/18/2019: Aortic atherosclerosis.  Normal heart size.  Three-vessel coronary artery calcifications.  No pericardial effusion PAD. Valvular heart disease. Echo 12/19/2008: Normal LV function, EF 65%.  Mild mitral and tricuspid insufficiency. Hypertension. Hyperlipidemia. Lipid panel 07/29/2024: LDL 47, HDL 52, TG 117, total 120. CKD stage III.  In summary, with echocardiogram in 2010 showing normal LV function with mild mitral regurgitation.  She also had a stress test at that time that was negative for ischemia.  Coronary artery calcification sclerosis was noted on chest CT.   Patient was seen in the clinic 09/03/2021 for routine follow-up. She had recently underwent short hospital admission for COVID-pneumonia. She was noted to be hyponatremic and hypokalemic. She improved with IV fluids, steroids, remdesivir . She was doing well with continued fatigue. No further cardiac workup indicated. No medication changes were made.   Patient was last seen in the office by me on 07/02/2023 for routine follow-up.  She had no cardiac complaints.  She was staying active by walking around the house and yard.  She had a seated peddler at home that she was not using regularly.  She reported ambulating with a cane for balance.  She was encouraged to advance activity as tolerated and with safety in mind.     History of Present Illness    Today, patient is accompanied by her health aide. Patient denies shortness of breath, dyspnea on exertion,  lower extremity edema, orthopnea or PND. No chest pain, pressure, or tightness. No palpitations. She is upset today because her daughter wants to move her to an assisted living facility. She currently lives at home alone and has help from a health aide and her daughter. She ambulates with a cane or rollator. She is able to do some light housework. She no longer drives. Discussed the importance of her safety.    ROS: All other systems reviewed and are otherwise negative except as noted in History of Present Illness.  EKGs/Labs Reviewed    EKG Interpretation Date/Time:  Friday September 23 2024 15:44:19 EST Ventricular Rate:  83 PR Interval:  156 QRS Duration:  64 QT Interval:  380 QTC Calculation: 446 R Axis:   50  Text Interpretation: Normal sinus rhythm Septal infarct , age undetermined When compared with ECG of 20-Jul-2024 15:50, PREVIOUS ECG IS PRESENT Confirmed by Loistine Sober [id number]) on 09/23/2024 3:50:15 PM   08/09/2024: ALT 9; AST 16; BUN 16; Creatinine, Ser 1.13; Potassium 3.5; Sodium 140   08/09/2024: Hemoglobin 9.2; WBC 18.0   08/09/2024: TSH 3.220    Physical Exam    VS:  BP 118/60 (BP Location: Left Arm, Patient Position: Sitting, Cuff Size: Normal)   Pulse 83   Ht 5' 2 (1.575 m)   Wt 117 lb 4 oz (53.2 kg)   SpO2 97%   BMI 21.45 kg/m  , BMI Body mass index is 21.45 kg/m.  GEN: Well nourished, well developed, in no acute distress. Neck: No JVD or carotid bruits. Cardiac:  RRR.  No murmur. No rubs or gallops.  Respiratory:  Respirations regular and unlabored. Clear to auscultation without rales, wheezing or rhonchi. GI: Soft, nontender, nondistended. Extremities: Radials/DP/PT 2+ and equal bilaterally. No clubbing or cyanosis. No edema   Skin: Warm and dry, no rash. Neuro: Strength intact.  Assessment & Plan   Coronary artery calcification/aortic atherosclerosis Seen on CT chest July 2020.  Patient denies chest pain, pressure or tightness. She is able to  do some light household activities.  - Continue aspirin , atorvastatin . - Advance activity as tolerated.    Hypertension BP today 118/60. No report of lightheadedness or dizziness. Ambulates with a cane or rollator for balance.  - Continue Maxzide , amlodipine .   Hyperlipidemia LDL 47 December 2025 at goal.  - Continue atorvastatin .  Disposition: Return in 1 year or sooner as needed.          Signed, Barnie HERO. Michaela Broski, DNP, NP-C  "

## 2024-09-23 NOTE — Patient Instructions (Signed)
 Medication Instructions:   Your physician recommends the following medication changes.  START TAKING: Triamterene -Hydrochlorothiazide  (Dyazide ) 37.5/25 mg tablet once daily  *If you need a refill on your cardiac medications before your next appointment, please call your pharmacy*  Lab Work:  None ordered at this time   If you have labs (blood work) drawn today and your tests are completely normal, you will receive your results only by:  MyChart Message (if you have MyChart) OR  A paper copy in the mail If you have any lab test that is abnormal or we need to change your treatment, we will call you to review the results.  Testing/Procedures:  None ordered at this time   Referrals:  None ordered at this time   Follow-Up:  At [facility], you and your health needs are our priority.  As part of our continuing mission to provide you with exceptional heart care, our providers are all part of one team.  This team includes your primary Cardiologist (physician) and Advanced Practice Providers or APPs (Physician Assistants and Nurse Practitioners) who all work together to provide you with the care you need, when you need it.  Your next appointment:   1 year(s)  Provider:    You may see Timothy Gollan, MD or one of the following Advanced Practice Providers on your designated Care Team:   Lonni Meager, NP Lesley Maffucci, PA-C Bernardino Bring, PA-C Cadence [place], PA-C Tylene Lunch, NP Barnie Hila, NP    We recommend signing up for the patient portal called MyChart.  Sign up information is provided on this After Visit Summary.  MyChart is used to connect with patients for Virtual Visits (Telemedicine).  Patients are able to view lab/test results, encounter notes, upcoming appointments, etc.  Non-urgent messages can be sent to your provider as well.   To learn more about what you can do with MyChart, go to [email or web address].

## 2024-09-23 NOTE — Addendum Note (Signed)
 Addended by: GLENDIA ALLENA RAMAN on: 09/23/2024 02:11 PM   Modules accepted: Orders

## 2024-09-26 ENCOUNTER — Inpatient Hospital Stay: Attending: Oncology

## 2024-10-03 ENCOUNTER — Inpatient Hospital Stay

## 2024-10-10 ENCOUNTER — Inpatient Hospital Stay

## 2024-10-11 ENCOUNTER — Ambulatory Visit: Admitting: Internal Medicine

## 2024-11-07 ENCOUNTER — Inpatient Hospital Stay

## 2024-11-07 ENCOUNTER — Inpatient Hospital Stay: Admitting: Oncology
